# Patient Record
Sex: Male | Born: 1945 | Race: White | Hispanic: No | Smoking: Never smoker
Health system: Southern US, Community
[De-identification: ages and names within clinical notes are randomized; demographics above are authoritative.]

## PROBLEM LIST (undated history)

## (undated) DIAGNOSIS — I69331 Monoplegia of upper limb following cerebral infarction affecting right dominant side: Secondary | ICD-10-CM

## (undated) DIAGNOSIS — R413 Other amnesia: Secondary | ICD-10-CM

## (undated) DIAGNOSIS — I252 Old myocardial infarction: Secondary | ICD-10-CM

## (undated) DIAGNOSIS — K922 Gastrointestinal hemorrhage, unspecified: Secondary | ICD-10-CM

## (undated) DIAGNOSIS — E785 Hyperlipidemia, unspecified: Secondary | ICD-10-CM

## (undated) DIAGNOSIS — G459 Transient cerebral ischemic attack, unspecified: Secondary | ICD-10-CM

## (undated) DIAGNOSIS — E119 Type 2 diabetes mellitus without complications: Secondary | ICD-10-CM

## (undated) DIAGNOSIS — I10 Essential (primary) hypertension: Secondary | ICD-10-CM

## (undated) DIAGNOSIS — E1159 Type 2 diabetes mellitus with other circulatory complications: Secondary | ICD-10-CM

## (undated) DIAGNOSIS — I6529 Occlusion and stenosis of unspecified carotid artery: Secondary | ICD-10-CM

## (undated) DIAGNOSIS — I2510 Atherosclerotic heart disease of native coronary artery without angina pectoris: Secondary | ICD-10-CM

## (undated) DIAGNOSIS — E782 Mixed hyperlipidemia: Secondary | ICD-10-CM

## (undated) HISTORY — DX: Occlusion and stenosis of unspecified carotid artery: I65.29

## (undated) HISTORY — DX: Monoplegia of upper limb following cerebral infarction affecting right dominant side: I69.331

## (undated) HISTORY — PX: SKIN CANCER EXCISION: SHX779

## (undated) HISTORY — PX: PORTA CATH INSERTION: CATH118285

## (undated) HISTORY — DX: Mixed hyperlipidemia: E78.2

## (undated) HISTORY — PX: MOLE REMOVAL: SHX2046

## (undated) HISTORY — DX: Atherosclerotic heart disease of native coronary artery without angina pectoris: I25.10

## (undated) HISTORY — DX: Old myocardial infarction: I25.2

## (undated) HISTORY — PX: HEMORRHOID SURGERY: SHX153

## (undated) HISTORY — DX: Other amnesia: R41.3

## (undated) HISTORY — DX: Type 2 diabetes mellitus with other circulatory complications: E11.59

## (undated) MED FILL — Pembrolizumab IV Soln 100 MG/4ML (25 MG/ML): INTRAVENOUS | Qty: 8 | Status: AC

---

## 2015-11-05 DIAGNOSIS — S022XXB Fracture of nasal bones, initial encounter for open fracture: Secondary | ICD-10-CM | POA: Diagnosis not present

## 2015-11-05 DIAGNOSIS — X58XXXA Exposure to other specified factors, initial encounter: Secondary | ICD-10-CM | POA: Diagnosis not present

## 2015-11-05 DIAGNOSIS — S0121XA Laceration without foreign body of nose, initial encounter: Secondary | ICD-10-CM | POA: Diagnosis not present

## 2015-11-05 DIAGNOSIS — R55 Syncope and collapse: Secondary | ICD-10-CM | POA: Diagnosis not present

## 2015-11-05 DIAGNOSIS — S022XXA Fracture of nasal bones, initial encounter for closed fracture: Secondary | ICD-10-CM | POA: Diagnosis not present

## 2015-11-05 DIAGNOSIS — T1490 Injury, unspecified: Secondary | ICD-10-CM | POA: Diagnosis not present

## 2015-11-05 DIAGNOSIS — R404 Transient alteration of awareness: Secondary | ICD-10-CM | POA: Diagnosis not present

## 2016-08-08 DIAGNOSIS — L821 Other seborrheic keratosis: Secondary | ICD-10-CM | POA: Diagnosis not present

## 2016-08-08 DIAGNOSIS — L570 Actinic keratosis: Secondary | ICD-10-CM | POA: Diagnosis not present

## 2016-08-08 DIAGNOSIS — L814 Other melanin hyperpigmentation: Secondary | ICD-10-CM | POA: Diagnosis not present

## 2016-10-01 DIAGNOSIS — Z23 Encounter for immunization: Secondary | ICD-10-CM | POA: Diagnosis not present

## 2016-10-01 DIAGNOSIS — R42 Dizziness and giddiness: Secondary | ICD-10-CM | POA: Diagnosis not present

## 2016-10-01 DIAGNOSIS — R799 Abnormal finding of blood chemistry, unspecified: Secondary | ICD-10-CM | POA: Diagnosis not present

## 2016-10-15 DIAGNOSIS — Z6831 Body mass index (BMI) 31.0-31.9, adult: Secondary | ICD-10-CM | POA: Diagnosis not present

## 2016-10-15 DIAGNOSIS — Z0000 Encounter for general adult medical examination without abnormal findings: Secondary | ICD-10-CM | POA: Diagnosis not present

## 2016-11-18 DIAGNOSIS — J00 Acute nasopharyngitis [common cold]: Secondary | ICD-10-CM | POA: Diagnosis not present

## 2017-06-16 DIAGNOSIS — J208 Acute bronchitis due to other specified organisms: Secondary | ICD-10-CM | POA: Diagnosis not present

## 2017-08-07 DIAGNOSIS — C44311 Basal cell carcinoma of skin of nose: Secondary | ICD-10-CM | POA: Diagnosis not present

## 2017-08-07 DIAGNOSIS — C44319 Basal cell carcinoma of skin of other parts of face: Secondary | ICD-10-CM | POA: Diagnosis not present

## 2017-08-07 DIAGNOSIS — D2239 Melanocytic nevi of other parts of face: Secondary | ICD-10-CM | POA: Diagnosis not present

## 2017-08-07 DIAGNOSIS — L570 Actinic keratosis: Secondary | ICD-10-CM | POA: Diagnosis not present

## 2017-08-28 DIAGNOSIS — L570 Actinic keratosis: Secondary | ICD-10-CM | POA: Diagnosis not present

## 2017-08-28 DIAGNOSIS — C44629 Squamous cell carcinoma of skin of left upper limb, including shoulder: Secondary | ICD-10-CM | POA: Diagnosis not present

## 2017-08-28 DIAGNOSIS — D2239 Melanocytic nevi of other parts of face: Secondary | ICD-10-CM | POA: Diagnosis not present

## 2017-09-10 DIAGNOSIS — C44329 Squamous cell carcinoma of skin of other parts of face: Secondary | ICD-10-CM | POA: Diagnosis not present

## 2017-09-16 DIAGNOSIS — C44629 Squamous cell carcinoma of skin of left upper limb, including shoulder: Secondary | ICD-10-CM | POA: Diagnosis not present

## 2017-10-20 DIAGNOSIS — Z6829 Body mass index (BMI) 29.0-29.9, adult: Secondary | ICD-10-CM | POA: Diagnosis not present

## 2017-10-20 DIAGNOSIS — Z23 Encounter for immunization: Secondary | ICD-10-CM | POA: Diagnosis not present

## 2017-10-20 DIAGNOSIS — Z0000 Encounter for general adult medical examination without abnormal findings: Secondary | ICD-10-CM | POA: Diagnosis not present

## 2017-10-21 DIAGNOSIS — Z125 Encounter for screening for malignant neoplasm of prostate: Secondary | ICD-10-CM | POA: Diagnosis not present

## 2017-10-21 DIAGNOSIS — Z0000 Encounter for general adult medical examination without abnormal findings: Secondary | ICD-10-CM | POA: Diagnosis not present

## 2017-10-29 DIAGNOSIS — C44629 Squamous cell carcinoma of skin of left upper limb, including shoulder: Secondary | ICD-10-CM | POA: Diagnosis not present

## 2017-10-29 DIAGNOSIS — L570 Actinic keratosis: Secondary | ICD-10-CM | POA: Diagnosis not present

## 2017-10-29 DIAGNOSIS — D485 Neoplasm of uncertain behavior of skin: Secondary | ICD-10-CM | POA: Diagnosis not present

## 2018-01-20 DIAGNOSIS — E119 Type 2 diabetes mellitus without complications: Secondary | ICD-10-CM | POA: Diagnosis not present

## 2018-01-20 DIAGNOSIS — E782 Mixed hyperlipidemia: Secondary | ICD-10-CM | POA: Diagnosis not present

## 2018-09-30 DIAGNOSIS — L820 Inflamed seborrheic keratosis: Secondary | ICD-10-CM | POA: Diagnosis not present

## 2018-10-21 DIAGNOSIS — Z125 Encounter for screening for malignant neoplasm of prostate: Secondary | ICD-10-CM | POA: Diagnosis not present

## 2018-10-21 DIAGNOSIS — Z23 Encounter for immunization: Secondary | ICD-10-CM | POA: Diagnosis not present

## 2018-10-21 DIAGNOSIS — Z6828 Body mass index (BMI) 28.0-28.9, adult: Secondary | ICD-10-CM | POA: Diagnosis not present

## 2018-10-21 DIAGNOSIS — Z0000 Encounter for general adult medical examination without abnormal findings: Secondary | ICD-10-CM | POA: Diagnosis not present

## 2018-10-21 DIAGNOSIS — E1169 Type 2 diabetes mellitus with other specified complication: Secondary | ICD-10-CM | POA: Diagnosis not present

## 2018-10-21 DIAGNOSIS — E782 Mixed hyperlipidemia: Secondary | ICD-10-CM | POA: Diagnosis not present

## 2018-11-02 DIAGNOSIS — R799 Abnormal finding of blood chemistry, unspecified: Secondary | ICD-10-CM | POA: Diagnosis not present

## 2018-12-29 DIAGNOSIS — L570 Actinic keratosis: Secondary | ICD-10-CM | POA: Diagnosis not present

## 2018-12-29 DIAGNOSIS — D485 Neoplasm of uncertain behavior of skin: Secondary | ICD-10-CM | POA: Diagnosis not present

## 2018-12-29 DIAGNOSIS — C44612 Basal cell carcinoma of skin of right upper limb, including shoulder: Secondary | ICD-10-CM | POA: Diagnosis not present

## 2018-12-29 DIAGNOSIS — L821 Other seborrheic keratosis: Secondary | ICD-10-CM | POA: Diagnosis not present

## 2018-12-29 DIAGNOSIS — C44629 Squamous cell carcinoma of skin of left upper limb, including shoulder: Secondary | ICD-10-CM | POA: Diagnosis not present

## 2018-12-29 DIAGNOSIS — D225 Melanocytic nevi of trunk: Secondary | ICD-10-CM | POA: Diagnosis not present

## 2018-12-29 DIAGNOSIS — D2239 Melanocytic nevi of other parts of face: Secondary | ICD-10-CM | POA: Diagnosis not present

## 2019-01-11 DIAGNOSIS — C44629 Squamous cell carcinoma of skin of left upper limb, including shoulder: Secondary | ICD-10-CM | POA: Diagnosis not present

## 2019-01-21 DIAGNOSIS — Z5321 Procedure and treatment not carried out due to patient leaving prior to being seen by health care provider: Secondary | ICD-10-CM | POA: Diagnosis not present

## 2019-01-21 DIAGNOSIS — R05 Cough: Secondary | ICD-10-CM | POA: Diagnosis not present

## 2019-01-23 DIAGNOSIS — E119 Type 2 diabetes mellitus without complications: Secondary | ICD-10-CM | POA: Diagnosis not present

## 2019-01-23 DIAGNOSIS — E860 Dehydration: Secondary | ICD-10-CM | POA: Diagnosis not present

## 2019-01-23 DIAGNOSIS — R7989 Other specified abnormal findings of blood chemistry: Secondary | ICD-10-CM | POA: Diagnosis not present

## 2019-01-23 DIAGNOSIS — J111 Influenza due to unidentified influenza virus with other respiratory manifestations: Secondary | ICD-10-CM | POA: Diagnosis not present

## 2019-01-23 DIAGNOSIS — R000 Tachycardia, unspecified: Secondary | ICD-10-CM | POA: Diagnosis not present

## 2019-01-23 DIAGNOSIS — A419 Sepsis, unspecified organism: Secondary | ICD-10-CM | POA: Diagnosis not present

## 2019-01-23 DIAGNOSIS — J101 Influenza due to other identified influenza virus with other respiratory manifestations: Secondary | ICD-10-CM | POA: Diagnosis not present

## 2019-01-23 DIAGNOSIS — E872 Acidosis: Secondary | ICD-10-CM | POA: Diagnosis not present

## 2019-01-23 DIAGNOSIS — Z79899 Other long term (current) drug therapy: Secondary | ICD-10-CM | POA: Diagnosis not present

## 2019-01-23 DIAGNOSIS — R05 Cough: Secondary | ICD-10-CM | POA: Diagnosis not present

## 2019-01-23 DIAGNOSIS — I10 Essential (primary) hypertension: Secondary | ICD-10-CM | POA: Diagnosis not present

## 2019-01-23 DIAGNOSIS — R0902 Hypoxemia: Secondary | ICD-10-CM | POA: Diagnosis not present

## 2019-01-24 DIAGNOSIS — R7989 Other specified abnormal findings of blood chemistry: Secondary | ICD-10-CM | POA: Diagnosis not present

## 2019-01-24 DIAGNOSIS — E860 Dehydration: Secondary | ICD-10-CM | POA: Diagnosis not present

## 2019-01-24 DIAGNOSIS — I10 Essential (primary) hypertension: Secondary | ICD-10-CM | POA: Diagnosis not present

## 2019-01-24 DIAGNOSIS — J111 Influenza due to unidentified influenza virus with other respiratory manifestations: Secondary | ICD-10-CM | POA: Diagnosis not present

## 2019-01-24 DIAGNOSIS — E119 Type 2 diabetes mellitus without complications: Secondary | ICD-10-CM | POA: Diagnosis not present

## 2019-01-24 DIAGNOSIS — A419 Sepsis, unspecified organism: Secondary | ICD-10-CM | POA: Diagnosis not present

## 2019-01-25 DIAGNOSIS — E860 Dehydration: Secondary | ICD-10-CM | POA: Diagnosis not present

## 2019-01-25 DIAGNOSIS — R7989 Other specified abnormal findings of blood chemistry: Secondary | ICD-10-CM | POA: Diagnosis not present

## 2019-01-25 DIAGNOSIS — E119 Type 2 diabetes mellitus without complications: Secondary | ICD-10-CM | POA: Diagnosis not present

## 2019-01-25 DIAGNOSIS — A419 Sepsis, unspecified organism: Secondary | ICD-10-CM | POA: Diagnosis not present

## 2019-01-25 DIAGNOSIS — J111 Influenza due to unidentified influenza virus with other respiratory manifestations: Secondary | ICD-10-CM | POA: Diagnosis not present

## 2019-01-25 DIAGNOSIS — I10 Essential (primary) hypertension: Secondary | ICD-10-CM | POA: Diagnosis not present

## 2019-01-30 ENCOUNTER — Inpatient Hospital Stay (HOSPITAL_COMMUNITY)
Admission: AD | Admit: 2019-01-30 | Discharge: 2019-02-09 | DRG: 040 | Disposition: A | Discharge status: Home Health | Payer: Medicare HMO | Source: Other Acute Inpatient Hospital | Attending: Neurology | Admitting: Neurology

## 2019-01-30 DIAGNOSIS — E1165 Type 2 diabetes mellitus with hyperglycemia: Secondary | ICD-10-CM | POA: Diagnosis present

## 2019-01-30 DIAGNOSIS — Z6826 Body mass index (BMI) 26.0-26.9, adult: Secondary | ICD-10-CM

## 2019-01-30 DIAGNOSIS — E669 Obesity, unspecified: Secondary | ICD-10-CM | POA: Diagnosis present

## 2019-01-30 DIAGNOSIS — L0291 Cutaneous abscess, unspecified: Secondary | ICD-10-CM

## 2019-01-30 DIAGNOSIS — E785 Hyperlipidemia, unspecified: Secondary | ICD-10-CM | POA: Diagnosis present

## 2019-01-30 DIAGNOSIS — I6340 Cerebral infarction due to embolism of unspecified cerebral artery: Secondary | ICD-10-CM | POA: Diagnosis not present

## 2019-01-30 DIAGNOSIS — J189 Pneumonia, unspecified organism: Secondary | ICD-10-CM | POA: Diagnosis not present

## 2019-01-30 DIAGNOSIS — I69314 Frontal lobe and executive function deficit following cerebral infarction: Secondary | ICD-10-CM | POA: Diagnosis not present

## 2019-01-30 DIAGNOSIS — R471 Dysarthria and anarthria: Secondary | ICD-10-CM | POA: Diagnosis present

## 2019-01-30 DIAGNOSIS — I6502 Occlusion and stenosis of left vertebral artery: Secondary | ICD-10-CM | POA: Diagnosis not present

## 2019-01-30 DIAGNOSIS — E876 Hypokalemia: Secondary | ICD-10-CM | POA: Diagnosis not present

## 2019-01-30 DIAGNOSIS — R531 Weakness: Secondary | ICD-10-CM | POA: Diagnosis present

## 2019-01-30 DIAGNOSIS — Z7984 Long term (current) use of oral hypoglycemic drugs: Secondary | ICD-10-CM | POA: Diagnosis not present

## 2019-01-30 DIAGNOSIS — D649 Anemia, unspecified: Secondary | ICD-10-CM | POA: Diagnosis not present

## 2019-01-30 DIAGNOSIS — J101 Influenza due to other identified influenza virus with other respiratory manifestations: Secondary | ICD-10-CM | POA: Diagnosis not present

## 2019-01-30 DIAGNOSIS — G8321 Monoplegia of upper limb affecting right dominant side: Secondary | ICD-10-CM | POA: Diagnosis not present

## 2019-01-30 DIAGNOSIS — E872 Acidosis: Secondary | ICD-10-CM | POA: Diagnosis not present

## 2019-01-30 DIAGNOSIS — A4102 Sepsis due to Methicillin resistant Staphylococcus aureus: Secondary | ICD-10-CM | POA: Diagnosis not present

## 2019-01-30 DIAGNOSIS — I6389 Other cerebral infarction: Secondary | ICD-10-CM | POA: Diagnosis not present

## 2019-01-30 DIAGNOSIS — I82611 Acute embolism and thrombosis of superficial veins of right upper extremity: Secondary | ICD-10-CM | POA: Diagnosis not present

## 2019-01-30 DIAGNOSIS — R200 Anesthesia of skin: Secondary | ICD-10-CM | POA: Diagnosis not present

## 2019-01-30 DIAGNOSIS — D72829 Elevated white blood cell count, unspecified: Secondary | ICD-10-CM | POA: Diagnosis not present

## 2019-01-30 DIAGNOSIS — R29701 NIHSS score 1: Secondary | ICD-10-CM | POA: Diagnosis present

## 2019-01-30 DIAGNOSIS — R6520 Severe sepsis without septic shock: Secondary | ICD-10-CM | POA: Diagnosis not present

## 2019-01-30 DIAGNOSIS — R112 Nausea with vomiting, unspecified: Secondary | ICD-10-CM

## 2019-01-30 DIAGNOSIS — R7881 Bacteremia: Secondary | ICD-10-CM | POA: Diagnosis not present

## 2019-01-30 DIAGNOSIS — Z8673 Personal history of transient ischemic attack (TIA), and cerebral infarction without residual deficits: Secondary | ICD-10-CM

## 2019-01-30 DIAGNOSIS — I10 Essential (primary) hypertension: Secondary | ICD-10-CM | POA: Diagnosis present

## 2019-01-30 DIAGNOSIS — J111 Influenza due to unidentified influenza virus with other respiratory manifestations: Secondary | ICD-10-CM | POA: Diagnosis present

## 2019-01-30 DIAGNOSIS — Z9282 Status post administration of tPA (rtPA) in a different facility within the last 24 hours prior to admission to current facility: Secondary | ICD-10-CM | POA: Diagnosis not present

## 2019-01-30 DIAGNOSIS — E119 Type 2 diabetes mellitus without complications: Secondary | ICD-10-CM

## 2019-01-30 DIAGNOSIS — I639 Cerebral infarction, unspecified: Secondary | ICD-10-CM | POA: Diagnosis present

## 2019-01-30 DIAGNOSIS — I6522 Occlusion and stenosis of left carotid artery: Secondary | ICD-10-CM | POA: Diagnosis not present

## 2019-01-30 DIAGNOSIS — A4101 Sepsis due to Methicillin susceptible Staphylococcus aureus: Secondary | ICD-10-CM | POA: Diagnosis not present

## 2019-01-30 DIAGNOSIS — I82629 Acute embolism and thrombosis of deep veins of unspecified upper extremity: Secondary | ICD-10-CM | POA: Diagnosis not present

## 2019-01-30 DIAGNOSIS — Z8709 Personal history of other diseases of the respiratory system: Secondary | ICD-10-CM | POA: Diagnosis not present

## 2019-01-30 DIAGNOSIS — B9562 Methicillin resistant Staphylococcus aureus infection as the cause of diseases classified elsewhere: Secondary | ICD-10-CM | POA: Diagnosis not present

## 2019-01-30 DIAGNOSIS — B9561 Methicillin susceptible Staphylococcus aureus infection as the cause of diseases classified elsewhere: Secondary | ICD-10-CM | POA: Diagnosis not present

## 2019-01-30 DIAGNOSIS — L03113 Cellulitis of right upper limb: Secondary | ICD-10-CM | POA: Diagnosis not present

## 2019-01-30 DIAGNOSIS — I82621 Acute embolism and thrombosis of deep veins of right upper extremity: Secondary | ICD-10-CM | POA: Diagnosis not present

## 2019-01-30 DIAGNOSIS — A419 Sepsis, unspecified organism: Secondary | ICD-10-CM | POA: Diagnosis not present

## 2019-01-30 DIAGNOSIS — I672 Cerebral atherosclerosis: Secondary | ICD-10-CM | POA: Diagnosis not present

## 2019-01-30 DIAGNOSIS — L0390 Cellulitis, unspecified: Secondary | ICD-10-CM | POA: Diagnosis not present

## 2019-01-30 DIAGNOSIS — E1159 Type 2 diabetes mellitus with other circulatory complications: Secondary | ICD-10-CM | POA: Diagnosis not present

## 2019-01-30 HISTORY — DX: Essential (primary) hypertension: I10

## 2019-01-30 HISTORY — DX: Hyperlipidemia, unspecified: E78.5

## 2019-01-30 HISTORY — DX: Transient cerebral ischemic attack, unspecified: G45.9

## 2019-01-30 HISTORY — DX: Gastrointestinal hemorrhage, unspecified: K92.2

## 2019-01-30 HISTORY — DX: Type 2 diabetes mellitus without complications: E11.9

## 2019-01-30 LAB — MRSA PCR SCREENING: MRSA by PCR: NEGATIVE

## 2019-01-30 LAB — GLUCOSE, CAPILLARY: Glucose-Capillary: 160 mg/dL — ABNORMAL HIGH (ref 70–99)

## 2019-01-30 MED ORDER — ACETAMINOPHEN 160 MG/5ML PO SOLN: 650.0000 mg | ORAL | Status: DC | PRN

## 2019-01-30 MED ORDER — GLUCERNA SHAKE PO LIQD
237.0000 mL | Freq: Three times a day (TID) | ORAL | Status: DC
  Administered 2019-01-30 – 2019-02-09 (×17): 237 mL via ORAL
  Filled 2019-01-30 (×3): qty 237

## 2019-01-30 MED ORDER — OSELTAMIVIR PHOSPHATE 75 MG PO CAPS
75.0000 mg | ORAL_CAPSULE | Freq: Every day | ORAL | Status: DC
  Filled 2019-01-30: qty 1

## 2019-01-30 MED ORDER — SENNOSIDES-DOCUSATE SODIUM 8.6-50 MG PO TABS: 1.0000 | ORAL_TABLET | Freq: Every evening | ORAL | Status: DC | PRN

## 2019-01-30 MED ORDER — CLEVIDIPINE BUTYRATE 0.5 MG/ML IV EMUL: 0.0000 mg/h | INTRAVENOUS | Status: DC

## 2019-01-30 MED ORDER — GUAIFENESIN-CODEINE 100-10 MG/5ML PO SOLN
5.0000 mL | ORAL | Status: DC | PRN
  Administered 2019-01-30 – 2019-02-08 (×7): 5 mL via ORAL
  Filled 2019-01-30 (×9): qty 5

## 2019-01-30 MED ORDER — ACETAMINOPHEN 325 MG PO TABS
650.0000 mg | ORAL_TABLET | ORAL | Status: DC | PRN
  Administered 2019-02-04: 650 mg via ORAL
  Filled 2019-01-30: qty 2

## 2019-01-30 MED ORDER — OSELTAMIVIR PHOSPHATE 75 MG PO CAPS
75.0000 mg | ORAL_CAPSULE | Freq: Every day | ORAL | Status: DC
  Administered 2019-01-31: 75 mg via ORAL
  Filled 2019-01-30: qty 1

## 2019-01-30 MED ORDER — ACETAMINOPHEN 650 MG RE SUPP
650.0000 mg | RECTAL | Status: DC | PRN
  Administered 2019-02-04: 650 mg via RECTAL
  Filled 2019-01-30 (×2): qty 1

## 2019-01-30 MED ORDER — SODIUM CHLORIDE 0.9 % IV SOLN
INTRAVENOUS | Status: DC
  Administered 2019-01-30 – 2019-02-01 (×2): via INTRAVENOUS
  Administered 2019-02-03: 500 mL via INTRAVENOUS
  Administered 2019-02-03: 22:00:00 via INTRAVENOUS

## 2019-01-30 MED ORDER — INSULIN ASPART 100 UNIT/ML ~~LOC~~ SOLN
0.0000 [IU] | Freq: Three times a day (TID) | SUBCUTANEOUS | Status: DC
  Administered 2019-01-31: 2 [IU] via SUBCUTANEOUS
  Administered 2019-02-01: 1 [IU] via SUBCUTANEOUS
  Administered 2019-02-01: 2 [IU] via SUBCUTANEOUS
  Administered 2019-02-02 (×2): 1 [IU] via SUBCUTANEOUS
  Administered 2019-02-04: 2 [IU] via SUBCUTANEOUS
  Administered 2019-02-04 – 2019-02-05 (×2): 1 [IU] via SUBCUTANEOUS
  Administered 2019-02-05: 2 [IU] via SUBCUTANEOUS
  Administered 2019-02-06 – 2019-02-07 (×3): 1 [IU] via SUBCUTANEOUS
  Administered 2019-02-07: 2 [IU] via SUBCUTANEOUS
  Administered 2019-02-08: 1 [IU] via SUBCUTANEOUS

## 2019-01-30 MED ORDER — STROKE: EARLY STAGES OF RECOVERY BOOK
Freq: Once | Status: AC
  Administered 2019-02-02: 18:00:00
  Filled 2019-01-30: qty 1

## 2019-01-30 MED ORDER — LABETALOL HCL 5 MG/ML IV SOLN: 20.0000 mg | Freq: Once | INTRAVENOUS | Status: DC

## 2019-01-30 MED ORDER — PANTOPRAZOLE SODIUM 40 MG IV SOLR
40.0000 mg | Freq: Every day | INTRAVENOUS | Status: DC
  Administered 2019-01-30: 40 mg via INTRAVENOUS
  Filled 2019-01-30: qty 40

## 2019-01-30 NOTE — H&P (Addendum)
NEURO HOSPITALIST  H&P   Chief Complaint:  Right arm weakness and numbness  History obtained from:  Patient  Chart   HPI:                                                                                                                                         Jon Nelson is an 73 y.o. male  With PMH diabetes, HTN, depression who presented to [facility] ED with c/o of sudden right arm weakness. Patient transferred to [facility] s/p TPA.   Per patient he was getting in the car about 11:30am today when he noticed that his right hand was weak. He was unable to put the keys in the ignition and start the car. So he started the car with hiss left hand drove to his sisters house. Denies any CP, SOB, leg weakness, vision changes or HA. No prior stroke history. Has had TIA in the past as well as GI bleed ( rectal bleeding). Patient has been taking tamiflu once daily for the past 3 days for the flu.  Hospital course:  CTA: no LVO CTH: no hemorrhage. BG: 98 BP: 140/71 unremarkable  CMP and CBC.  Date last known well: Date: 01/30/2019 Time last known well: Time: 11:30 tPA Given: Yes ; started @ 1357 Modified Rankin: Rankin Score=0 NIHSS:1  Past medical history Diabetes Hypertension Hyperlipidemia  No family history on file.   Significant family history  Social History:  has no history on file for tobacco, alcohol, and drug.  Allergies: Allergies not on file  Medications:                                                                                                                           Scheduled: .  stroke: mapping our early stages of recovery book   Does not apply Once  . labetalol  20 mg Intravenous Once  . oseltamivir  75 mg Oral Daily  . pantoprazole (PROTONIX) IV  40 mg Intravenous QHS   Continuous: . sodium chloride    . clevidipine     SVX:[id number] **OR** acetaminophen (TYLENOL) oral  liquid 160 mg/5 mL **OR** acetaminophen,  senna-docusate  ROS:                                                                                                             ROS was performed and is negative except as noted in HPI  General Examination:                                                                                                      Blood pressure (!) 145/80, pulse 73, temperature 97.8 F (36.6 C), temperature source Oral, resp. rate 16, height 5\' 9"  (1.753 m), weight 79.9 kg, SpO2 99 %.  HEENT-  Normocephalic, no lesions, without obvious abnormality.  Normal external eye and conjunctiva. Cardiovascular- S1-S2 audible, pulses palpable throughout  Lungs-no rhonchi or wheezing noted, no excessive working breathing.  Saturations within normal limits Abdomen- All 4 quadrants palpated and non tender Extremities- Warm, dry and intact Musculoskeletal-no joint tenderness, deformity or swelling Skin-warm and dry, no hyperpigmentation, vitiligo, or suspicious lesions  Neurological Examination Mental Status: Alert, oriented, thought content appropriate.  Speech fluent without evidence of aphasia.  Able to follow commands without difficulty.  Mild dysarthria. Cranial Nerves: II: ; Visual fields grossly normal,  III,IV, VI: ptosis not present, extra-ocular motions intact bilaterally, pupils equal, round, reactive to light and accommodation V,VII: smile symmetric, facial light touch sensation normal bilaterally VIII: hearing normal bilaterally IX,X: uvula rises midline XI: bilateral shoulder shrug XII: midline tongue extension Motor: Right upper extremity shows no vertical drift but is 4/5 at the biceps, triceps and brachioradialis.  Small muscles of the right hand are 2/5.  Right leg 5/5.  Left upper and lower extremities 5/5. Tone and bulk:normal tone throughout; no atrophy noted Sensory:  light touch intact throughout, bilaterally Plantars: Right: downgoing   Left: downgoing Cerebellar: normal finger-to-nose, normal  heel-to-shin test Gait: deferred   Lab Results: unremarkable CMP and CBC.  Imaging: Reviewed imaging report from [place], MSN, NP-C [facility] [phone]   01/30/2019, 3:00 PM   Attending physician note to follow with Assessment and plan .  Attending addendum I have seen and examined the patient in the ICU. I was called by [facility] ED provider to transfer the patient to the ICU under neurology service after he was given IV TPA at the outside hospital. Briefly, from history with the patient and review of chart, last known normal 11:30 AM and had sudden onset of right hand and arm weakness.  He still drove his car to his sister's house and was then taken to the hospital where he was seen  by telemedicine neurology.  Initial NIH stroke scale was low but he was given IV TPA because of disabling symptoms. Of note, patient on treatment with Tamiflu because of recent diagnosis of influenza.  Has taken approximately 2 days worth of treatment.  Continues to have some nasal drainage, cough and congested eyes. I have independently reviewed imaging in the PACS system-CT head no acute changes, aspects 10.  CTA of the head and neck shows no emergent large vessel occlusion. I agree with examination documented above-NIH stroke scale 1.  Assessment: 73 y.o. male  With PMH diabetes, HTN, depression who presented to [facility] ED with c/o of sudden right arm weakness.  Evaluated by telemedicine neurology, given IV TPA. Patient transferred to [facility] s/p TPA. CTA: no LVO CTH: no hemorrhage. Stroke Risk Factors - diabetes mellitus and hypertension   Plan: Acute Ischemic Stroke Acuity: Acute Current Suspected Etiology: Under investigation- suspect small vessel disease Continue Evaluation:  -Admit to: Neurological ICU -Hold Aspirin until 24 hour post tPA neuroimaging is stable and without evidence of bleeding -Blood pressure control, goal of SYS <180.  Use  labetalol/hydralazine/Cleviprex drip as needed. -MRI/ECHO/A1C/Lipid panel. -Hyperglycemia management per SSI to maintain glucose 140-180mg /dL. -PT/OT/ST therapies and recommendations when able  CNS -Close neuro monitoring  Dysarthria Dysphagia following cerebral infarction  -NPO until cleared by speech -ST -Advance diet as tolerated  Hemiplegia and hemiparesis following cerebral infarction affecting right dominant side -PT/OT -PM&R consult  RESP No acute issues. -Monitor clinically -Maintain saturations above 92%.    CV Essential (primary) hypertension -Aggressive BP control as documented above. -TTE  Hyperlipidemia, unspecified  - Statin for goal LDL < 70  HEME Labs reviewed from [place].  Her globin 14.4, platelets over 300. -Monitor -transfuse for hgb < 7  ENDO Type 2 diabetes mellitus with hyperglycemia  -SSI -goal HgbA1c < 7  GI/GU Labs reviewed-creatinine 0.6 Normal sodium and potassium -Gentle hydration -Monitor labs -Replete as necessary  ID Influenza positive -Tamiflu for 3 more days -Droplet precautions -CXR  Nutrition E66.9 Obesity  -diet consult  Prophylaxis DVT: SCDs.  No antiplatelets or anticoagulants until 24-hour post TPA scan negative for bleed. GI: Protonix Bowel: Docusate/senna  Diet: NPO until cleared by speech or bedside swallow evaluation.  Code Status: Full CodeDNR   THE FOLLOWING WERE PRESENT ON ADMISSION: Acute ischemic stroke Right hemiparesis Influenza Dysarthria   -- Amie Portland, MD [facility] Pager: [phone] If 7pm to 7am, please call on call as listed on AMION.    CRITICAL CARE ATTESTATION Performed by: Amie Portland, MD Total critical care time: 30 minutes Critical care time was exclusive of separately billable procedures and treating other patients and/or supervising APPs/Residents/Students Critical care was necessary to treat or prevent imminent or life-threatening deterioration  due to acute ischemic stroke, influenza, diabetes This patient is critically ill and at significant risk for neurological worsening and/or death and care requires constant monitoring. Critical care was time spent personally by me on the following activities: development of treatment plan with patient and/or surrogate as well as nursing, discussions with consultants, evaluation of patient's response to treatment, examination of patient, obtaining history from patient or surrogate, ordering and performing treatments and interventions, ordering and review of laboratory studies, ordering and review of radiographic studies, pulse oximetry, re-evaluation of patient's condition, participation in multidisciplinary rounds and medical decision making of high complexity in the care of this patient.

## 2019-01-30 NOTE — Progress Notes (Signed)
Pt complains of frequent cough. MD made aware and new orders in [facility].

## 2019-01-31 ENCOUNTER — Inpatient Hospital Stay (HOSPITAL_COMMUNITY): Payer: Medicare HMO

## 2019-01-31 ENCOUNTER — Encounter (HOSPITAL_COMMUNITY): Payer: Self-pay | Admitting: Neurology

## 2019-01-31 ENCOUNTER — Other Ambulatory Visit (HOSPITAL_COMMUNITY): Payer: Medicare HMO

## 2019-01-31 DIAGNOSIS — I6522 Occlusion and stenosis of left carotid artery: Secondary | ICD-10-CM

## 2019-01-31 DIAGNOSIS — I10 Essential (primary) hypertension: Secondary | ICD-10-CM | POA: Diagnosis present

## 2019-01-31 DIAGNOSIS — E119 Type 2 diabetes mellitus without complications: Secondary | ICD-10-CM

## 2019-01-31 DIAGNOSIS — I639 Cerebral infarction, unspecified: Secondary | ICD-10-CM

## 2019-01-31 LAB — LIPID PANEL
CHOLESTEROL: 120 mg/dL (ref 0–200)
HDL: 22 mg/dL — ABNORMAL LOW (ref >40)
LDL Cholesterol: 71 mg/dL (ref 0–99)
Total CHOL/HDL Ratio: 5.5 RATIO
Triglycerides: 137 mg/dL (ref <150)
VLDL: 27 mg/dL (ref 0–40)

## 2019-01-31 LAB — GLUCOSE, CAPILLARY
GLUCOSE-CAPILLARY: 175 mg/dL — AB (ref 70–99)
GLUCOSE-CAPILLARY: 136 mg/dL — AB (ref 70–99)
Glucose-Capillary: 120 mg/dL — ABNORMAL HIGH (ref 70–99)
Glucose-Capillary: 120 mg/dL — ABNORMAL HIGH (ref 70–99)

## 2019-01-31 LAB — HEMOGLOBIN A1C
Hgb A1c MFr Bld: 6.6 % — ABNORMAL HIGH (ref 4.8–5.6)
Mean Plasma Glucose: 142.72 mg/dL

## 2019-01-31 LAB — ECHOCARDIOGRAM COMPLETE
HEIGHTINCHES: 69 in
Weight: 2864 oz

## 2019-01-31 MED ORDER — LORAZEPAM 2 MG/ML IJ SOLN
0.5000 mg | Freq: Once | INTRAMUSCULAR | Status: AC | PRN
  Administered 2019-01-31: 0.5 mg via INTRAVENOUS
  Filled 2019-01-31: qty 1

## 2019-01-31 MED ORDER — OSELTAMIVIR PHOSPHATE 75 MG PO CAPS
75.0000 mg | ORAL_CAPSULE | Freq: Two times a day (BID) | ORAL | Status: AC
  Administered 2019-01-31 – 2019-02-02 (×5): 75 mg via ORAL
  Filled 2019-01-31 (×5): qty 1

## 2019-01-31 MED ORDER — ASPIRIN 325 MG PO TABS
325.0000 mg | ORAL_TABLET | Freq: Every day | ORAL | Status: DC
  Administered 2019-01-31 – 2019-02-05 (×5): 325 mg via ORAL
  Filled 2019-01-31 (×5): qty 1

## 2019-01-31 MED ORDER — NAPHAZOLINE-GLYCERIN 0.012-0.2 % OP SOLN
1.0000 [drp] | Freq: Four times a day (QID) | OPHTHALMIC | Status: DC | PRN
  Filled 2019-01-31: qty 15

## 2019-01-31 MED ORDER — PANTOPRAZOLE SODIUM 40 MG PO TBEC
40.0000 mg | DELAYED_RELEASE_TABLET | Freq: Every day | ORAL | Status: DC
  Administered 2019-01-31 – 2019-02-08 (×9): 40 mg via ORAL
  Filled 2019-01-31 (×8): qty 1

## 2019-01-31 NOTE — Progress Notes (Signed)
  Echocardiogram 2D Echocardiogram has been performed.  Merrie Roof F 01/31/2019, 2:44 PM

## 2019-01-31 NOTE — Progress Notes (Signed)
STROKE TEAM PROGRESS NOTE   HISTORY OF PRESENT ILLNESS (per record) Jon Nelson is an 73 y.o. male  With PMH diabetes, HTN, depression who presented to [facility] ED with c/o of sudden right arm weakness. Patient transferred to [facility] s/p TPA.   Per patient he was getting in the car about 11:30am today when he noticed that his right hand was weak. He was unable to put the keys in the ignition and start the car. So he started the car with his left hand drove to his sisters house. Denies any CP, SOB, leg weakness, vision changes or HA. No prior stroke history. Has had TIA in the past as well as GI bleed ( rectal bleeding). Patient has been taking tamiflu once daily for the past 3 days for the flu.  Hospital course:  CTA: no LVO CTH: no hemorrhage. BG: 98 BP: 140/71 unremarkable  CMP and CBC.  Date last known well: Date: 01/30/2019 Time last known well: Time: 11:30 tPA Given: Yes ; started @ 1357 Modified Rankin: Rankin Score=0 NIHSS:1   SUBJECTIVE (INTERVAL HISTORY) His family is not at bedside He says he feels pretty good. Right arm still weak with sensory loss.     OBJECTIVE Vitals:   01/31/19 0800 01/31/19 0900 01/31/19 1000 01/31/19 1100  BP: 118/88 (!) 113/54 119/75 127/70  Pulse: 73 67 72 76  Resp: 19 16 19 19   Temp: 98.4 F (36.9 C)     TempSrc: Oral     SpO2: 94% 95% 94% 95%  Weight:      Height:        CBC: No results for input(s): WBC, NEUTROABS, HGB, HCT, MCV, PLT in the last 168 hours.  Basic Metabolic Panel: No results for input(s): NA, K, CL, CO2, GLUCOSE, BUN, CREATININE, CALCIUM, MG, PHOS in the last 168 hours.  Lipid Panel:     Component Value Date/Time   CHOL 120 01/31/2019 0513   TRIG 137 01/31/2019 0513   HDL 22 (L) 01/31/2019 0513   CHOLHDL 5.5 01/31/2019 0513   VLDL 27 01/31/2019 0513   LDLCALC 71 01/31/2019 0513   HgbA1c:  Lab Results  Component Value Date   HGBA1C 6.6 (H) 01/31/2019   Urine Drug Screen: No results found for: LABOPIA,  COCAINSCRNUR, LABBENZ, AMPHETMU, THCU, LABBARB  Alcohol Level No results found for: ETH  IMAGING   No results found.  MR BRAIN WO CONTRAST 01/30/2019 Pending   CT HEAD WO CONTRAST  01/30/2019 Was negative for acute event at [facility], moderate small-vessel ischemic changes  Transthoracic Echocardiogram  00/00/2020 Pending    CTA of H/N 01/30/2019 at [facility] showed No LVO but cervical carotid artery atherosclerosis, prominent soft plaque in the left carotid bulb, atherosclerosis with mild right and moderate left cavernous ICA stenosis and mod-severe right P1 stenosis. Moderate distal left vertebral artery stenosis.     Exam: NAD, pleasant                  Speech:    Speech is normal; fluent and spontaneous with normal comprehension.  Cognition:    The patient is oriented to person, place, and time, month, year.    recent and remote memory intact;     language fluent;    Cranial Nerves:    The pupils are equal, round, and reactive to light.VA intact. Trigeminal sensation is intact and the muscles of mastication are normal. The face is symmetric. The palate elevates in the midline. Hearing intact. Voice is normal. Shoulder shrug is  normal. The tongue has normal motion without fasciculations.   Coordination:  right arm impaired FTN appropriate for weakness, decreased fine motor on the right.   Motor Observation:    no involuntary movements noted. Tone:    Normal muscle tone.      Strength: Right arm: biceps/deltoid 3/5, triceps 4+5, right hand grip weak. Otherwise strength is V/V in the upper and lower limbs.      Sensation: decreased right arm sensation    PAST MEDICAL HISTORY Past Medical History:  Diagnosis Date  . Diabetes ([place])   . HLD (hyperlipidemia)   . HTN (hypertension)     SURGICAL HISTORY History reviewed. No pertinent surgical history.  FAMILY HISTORY History reviewed. No pertinent family history.  SOCIAL HISTORY  has no history on file for  tobacco, alcohol, and drug.  ASSESSMENT/PLAN [name] with history of diabetes, HTN, TIA, GI Bleed, HLD, depression presenting with sudden right arm weakness.  tPA Given at OSH 01/30/2019 @ 1357  Suspected stroke:  MRI pending  Resultant  Right arm weakness and sensory loss  CT head at [facility] - moderate small vessel ischemic disease, nothing acute   MRI head - pending  MRA head - not performed (See CTA H&N)  CTA H&N - no LVO Jon Nelson). CTA of H/N at [facility] showed No LVO but cervical carotid artery atherosclerosis, prominent soft plaque in the left carotid bulb, atherosclerosis with mild right and moderate left cavernous ICA stenosis and mod-severe right P1 stenosis. Moderate distal left vertebral artery stenosis.   Prominent soft plaque in the left carotid bulb may need vascular consult pending MRI results.   LDL - 71  HgbA1c - 6.6  UDS - not performed  VTE prophylaxis - SCDs  Diet  - Carb modified with thin liquids  No antithrombotic prior to admission, now on No antithrombotic   (post tPA)  Patient counseled to be compliant with his antithrombotic medications  Ongoing aggressive stroke risk factor management  Therapy recommendations:  pending  Disposition:  Pending  Hypertension  Stable . Permissive hypertension - Keep SBP < 180 mmHg post tPA . Long-term BP goal normotensive  Hyperlipidemia  Lipid lowering medication PTA:  none  LDL 71, goal < 70  Current lipid lowering medication: none - if positive for stroke start statin  Continue statin at discharge  Diabetes  HgbA1c 6.6, goal < 7.0  Controlled  Other Stroke Risk Factors  Advanced age History pending  Cigarette smoker ?  ETOH use ?  No prior hx of stroke, TIA in the past  Family hx stroke ?   Other Active Problems  Recent flu like sxs - on Tamiflu - droplet precautions   Hospital day # 1  Patient with acute onset right arm weakness and sensory  changes.  He presented to an outside hospital and was given TPA.  Transferred here.  Continues to have symptoms.  Pending MRI of the brain. Prominent soft plaque in the left carotid bulb may need vascular consult pending MRI results.   Personally examined patient and images, and have participated in and made any corrections needed to history, physical, neuro exam,assessment and plan as stated above.  I have personally obtained the history, evaluated lab date, reviewed imaging studies and agree with radiology interpretations.    Sarina Ill, MD Stroke Neurology   A total of 25 minutes was spent for the care of this patient, spent on counseling patient and family on different diagnostic and therapeutic  options, counseling and coordination of care, riskd ans benefits of management, compliance, or risk factor reduction and education.   To contact Stroke Continuity provider, please refer to [email or web address] After hours, contact General Neurology

## 2019-01-31 NOTE — Consult Note (Signed)
Hospital Consult    Reason for Consult:  cva Referring Physician:  Dr. Jaynee Eagles MRN #:  [id number]  History of Present Illness: This is a 73 y.o. male risk factors of diabetes and hypertension presented to outside hospital with sudden arm weakness.  Still has weakness in his right hand although arm has improved.  Did not have other symptoms per patient but per family possibly had slurring of speech at the time.  Notes TIA in the past with no previous strokes and no amaurosis.  Recently recovered from influenza virus currently on droplet precautions.  TPA was given yesterday at the time of stroke.  Past Medical History:  Diagnosis Date  . Diabetes ([place])   . GI bleed   . HLD (hyperlipidemia)   . HTN (hypertension)   . TIA (transient ischemic attack)     History reviewed. No pertinent surgical history.  No Known Allergies  Prior to Admission medications   Medication Sig Start Date End Date Taking? Authorizing Provider  atorvastatin (LIPITOR) 10 MG tablet Take 10 mg by mouth daily. 01/14/19  Yes [provider]  METFORMIN HCL PO Take 1 tablet by mouth every morning.   Yes [provider]  oseltamivir (TAMIFLU) 75 MG capsule Take 75 mg by mouth daily. 01/30/19 02/03/19 Yes [provider]    Social History   Socioeconomic History  . Marital status: Married    Spouse name: Not on file  . Number of children: Not on file  . Years of education: Not on file  . Highest education level: Not on file  Occupational History  . Not on file  Social Needs  . Financial resource strain: Not on file  . Food insecurity:    Worry: Not on file    Inability: Not on file  . Transportation needs:    Medical: Not on file    Non-medical: Not on file  Tobacco Use  . Smoking status: Not on file  Substance and Sexual Activity  . Alcohol use: Not on file  . Drug use: Not on file  . Sexual activity: Not on file  Lifestyle  . Physical activity:    Days per week: Not on file      Minutes per session: Not on file  . Stress: Not on file  Relationships  . Social connections:    Talks on phone: Not on file    Gets together: Not on file    Attends religious service: Not on file    Active member of club or organization: Not on file    Attends meetings of clubs or organizations: Not on file    Relationship status: Not on file  . Intimate partner violence:    Fear of current or ex partner: Not on file    Emotionally abused: Not on file    Physically abused: Not on file    Forced sexual activity: Not on file  Other Topics Concern  . Not on file  Social History Narrative  . Not on file     History reviewed. No pertinent family history.  ROS:  Cardiovascular: []  chest pain/pressure []  palpitations []  SOB lying flat []  DOE []  pain in legs while walking []  pain in legs at rest []  pain in legs at night []  non-healing ulcers []  hx of DVT []  swelling in legs  Pulmonary: []  productive cough []  asthma/wheezing []  home O2  Neurologic: [x]  weakness in [x]  arms []  legs []  numbness in []  arms []  legs []  hx of  CVA []  mini stroke [] difficulty speaking or slurred speech []  temporary loss of vision in one eye []  dizziness  Hematologic: [x]  hx of cancer (skin cancer) []  bleeding problems []  problems with blood clotting easily  Endocrine:   []  diabetes []  thyroid disease  GI []  vomiting blood []  blood in stool  GU: []  CKD/renal failure []  HD--[]  M/W/F or []  T/T/S []  burning with urination []  blood in urine  Psychiatric: []  anxiety []  depression  Musculoskeletal: []  arthritis []  joint pain  Integumentary: []  rashes []  ulcers  Constitutional: []  fever []  chills   Physical Examination  Vitals:   01/31/19 1357 01/31/19 1400  BP: 108/73 110/68  Pulse: 85 97  Resp: (!) 23 15  Temp:    SpO2: 94% 95%   Body mass index is 26.01 kg/m.  General:  NAD Gait: Not observed HENT: WNL, normocephalic Pulmonary: normal non-labored  breathing Cardiac: Palpable bilateral radial and femoral pulses Abdomen:  soft, NT/ND, no masses Extremities: Sutures in left arm otherwise no abnormalities Neurologic: A&O X 3; Appropriate Affect ; right hand weakness noted  CBC No results found for: WBC, RBC, HGB, HCT, PLT, MCV, MCH, MCHC, RDW, LYMPHSABS, MONOABS, EOSABS, BASOSABS  BMET No results found for: NA, K, CL, CO2, GLUCOSE, BUN, CREATININE, CALCIUM, GFRNONAA, GFRAA  COAGS: No results found for: INR, PROTIME   Non-Invasive Vascular Imaging:   MRI  IMPRESSION: 1. Small scattered foci of acute/early subacute infarction are present within the left posterosuperior frontal and parietal lobes inclusive of precentral gyrus. No hemorrhage or mass effect. 2. Moderate chronic microvascular ischemic changes and volume loss of the brain. 3. Paranasal sinus disease with fluid levels which may represent acute sinusitis. Partial bilateral mastoid opacification.   CTAngio  IMPRESSION: 1. No large vessel occlusion. 2. Cervical carotid artery atherosclerosis including prominent soft plaque in the left carotid bulb. No significant stenosis. 3. Intracranial atherosclerosis resulting in mild right moderate left cavernous ICA stenoses and a moderate to severe right P1 stenosis. 4. Moderate distal left vertebral artery stenosis. 5. Aortic Atherosclerosis (ICD10-I70.0).    ASSESSMENT/PLAN: This is a 73 y.o. 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  CT Angie of his neck at [facility] demonstrates significant amount of soft plaque at the carotid bifurcation although by NASCET criteria is insignificant stenosis does appear to be probable cause for embolic lesion if no other source is found.  I discussed with patient likely need for carotid endarterectomy versus stenting and I will discuss with team tomorrow.  Would plan intervention prior to 2 weeks.  Needs antiplatelet and statin therapy.  Joie Reamer C.  Donzetta Matters, MD [facility] Office: [phone] Pager: [phone]

## 2019-01-31 NOTE — Progress Notes (Signed)
PT Cancellation Note  Patient Details Name: Jon Nelson MRN: [id number] DOB: 03-06-1946   Cancelled Treatment:    Reason Eval/Treat Not Completed: Active bedrest order   Duncan Dull 01/31/2019, 6:34 AM

## 2019-01-31 NOTE — Progress Notes (Signed)
Aspirin 325mg  restarted 01/31/19 18:05p.

## 2019-02-01 ENCOUNTER — Inpatient Hospital Stay (HOSPITAL_COMMUNITY): Payer: Medicare HMO

## 2019-02-01 DIAGNOSIS — I639 Cerebral infarction, unspecified: Secondary | ICD-10-CM

## 2019-02-01 LAB — GLUCOSE, CAPILLARY
Glucose-Capillary: 123 mg/dL — ABNORMAL HIGH (ref 70–99)
Glucose-Capillary: 99 mg/dL (ref 70–99)
Glucose-Capillary: 188 mg/dL — ABNORMAL HIGH (ref 70–99)

## 2019-02-01 MED ORDER — ATORVASTATIN CALCIUM 40 MG PO TABS
40.0000 mg | ORAL_TABLET | Freq: Every day | ORAL | Status: DC
  Administered 2019-02-01 – 2019-02-03 (×3): 40 mg via ORAL
  Filled 2019-02-01 (×4): qty 1

## 2019-02-01 NOTE — Progress Notes (Signed)
Nutrition Brief Note  Patient identified on the Malnutrition Screening Tool (MST) Report  Wt Readings from Last 15 Encounters:  01/30/19 79.9 kg  01/31/19 81.2 kg    Body mass index is 26.01 kg/m. Patient meets criteria for overweight based on current BMI. Pt reports weight has been stable.   Current diet order is carbohydrate modified. Pt reports having a good appetite currently and PTA with usual consumption of at least 3 meals a day with no difficulties. Pt with no fat mass or muscle mass depletion. Labs and medications reviewed. Pt currently has Glucerna Shake ordered and would like to continue with them.  No nutrition interventions warranted at this time. If nutrition issues arise, please consult RD.   Corrin Parker, MS, RD, LDN Pager # [phone] After hours/ weekend pager # [phone]

## 2019-02-01 NOTE — Progress Notes (Signed)
OT Cancellation Note  Patient Details Name: Jon Nelson MRN: [id number] DOB: 1946/02/23   Cancelled Treatment:    Reason Eval/Treat Not Completed: Active bedrest order. Will follow and initiate OT eval when appropriate and able.   Delight Stare, OT Acute Rehabilitation Services Pager [phone] Office [phone]    Delight Stare 02/01/2019, 7:39 AM

## 2019-02-01 NOTE — Evaluation (Addendum)
Physical Therapy Evaluation Patient Details Name: Jon Nelson MRN: [id number] DOB: [birth year] Today's Date: 02/01/2019   History of Present Illness  Jon Nelson is an 73 y.o. male  With PMH diabetes, HTN, depression who presented to [facility] ED with c/o of sudden right arm weakness. Patient transferred to [facility] s/p TPA.  MRI showed small scattered foci of acute/early subacute infarction are present within the left posterosuperior frontal and parietal lobes inclusive of precentral gyrus. No hemorrhage or mass effect.  Clinical Impression  Pt admitted with/for right arm weakness and found to have stroke per MRI.  Pt generally supervision to independent, but does show a tendency to have episodes of mild instability when balance is challenged..  Pt currently limited functionally due to the problems listed below.  (see problems list.)  Pt will benefit from PT to maximize function and safety to be able to get home safely with available assist.  I have discussed the current level of function with the patient.  He acknowledges that he is not at baseline, but that if he asks for help and makes safe decisions, he should be safe at home alone.        Follow Up Recommendations Outpatient PT;Other (comment)(May not need PT after continued improvement)    Equipment Recommendations  None recommended by PT    Recommendations for Other Services       Precautions / Restrictions Precautions Precautions: Fall(minimal risk) Restrictions Weight Bearing Restrictions: No      Mobility  Bed Mobility Overal bed mobility: Needs Assistance Bed Mobility: Sit to Supine     Supine to sit: Supervision Sit to supine: Supervision   General bed mobility comments: no assist required  Transfers Overall transfer level: Needs assistance Equipment used: None Transfers: Sit to/from Stand Sit to Stand: Supervision         General transfer comment: no assist required  Ambulation/Gait Ambulation/Gait  assistance: Supervision Gait Distance (Feet): 400 Feet Assistive device: None Gait Pattern/deviations: Step-through pattern   Gait velocity interpretation: >2.62 ft/sec, indicative of community ambulatory General Gait Details: generally steady with infrequent episodes of step or drift L to recover balance when scanning or abruptly changing direction.  Stairs            Wheelchair Mobility    Modified Rankin (Stroke Patients Only) Modified Rankin (Stroke Patients Only) Pre-Morbid Rankin Score: No symptoms Modified Rankin: Moderate disability     Balance Overall balance assessment: Needs assistance   Sitting balance-Leahy Scale: Normal     Standing balance support: No upper extremity supported;During functional activity Standing balance-Leahy Scale: Good                               Pertinent Vitals/Pain Pain Assessment: No/denies pain    Home Living Family/patient expects to be discharged to:: Private residence Living Arrangements: Alone Available Help at Discharge: Family Type of Home: House Home Access: Level entry     Home Layout: One level Home Equipment: Grab bars - tub/shower      Prior Function Level of Independence: Independent         Comments: driving, retired      Journalist, newspaper   Dominant Hand: Left    Extremity/Trunk Assessment   Upper Extremity Assessment Upper Extremity Assessment: RUE deficits/detail RUE Deficits / Details: proximal strength> distal, grossly 3+/5 shoulder, elbow, 3-/5 forearm, wrist and 2-/5 hand flexion/extension  RUE Sensation: WNL RUE Coordination: decreased fine motor  Lower Extremity Assessment Lower Extremity Assessment: Defer to PT evaluation       Communication   Communication: No difficulties  Cognition Arousal/Alertness: Awake/alert Behavior During Therapy: WFL for tasks assessed/performed Overall Cognitive Status: Within Functional Limits for tasks assessed                                  General Comments: short blessed test completed: 5/28, normal       General Comments      Exercises Other Exercises Other Exercises: provided squeeze ball for R hand exercises    Assessment/Plan    PT Assessment Patient needs continued PT services  PT Problem List Decreased strength;Decreased activity tolerance;Decreased balance;Decreased mobility       PT Treatment Interventions Gait training;Therapeutic activities;Stair training;Functional mobility training;Balance training    PT Goals (Current goals can be found in the Care Plan section)  Acute Rehab PT Goals Patient Stated Goal: independent at home PT Goal Formulation: With patient Time For Goal Achievement: 02/08/19 Potential to Achieve Goals: Good Additional Goals Additional Goal #1: DGI at seast >=21 to correlated to lower fall risk    Frequency Min 3X/week   Barriers to discharge        Co-evaluation               AM-PAC PT "6 Clicks" Mobility  Outcome Measure Help needed turning from your back to your side while in a flat bed without using bedrails?: None Help needed moving from lying on your back to sitting on the side of a flat bed without using bedrails?: None Help needed moving to and from a bed to a chair (including a wheelchair)?: None Help needed standing up from a chair using your arms (e.g., wheelchair or bedside chair)?: None Help needed to walk in hospital room?: A Little Help needed climbing 3-5 steps with a railing? : A Little 6 Click Score: 22    End of Session   Activity Tolerance: Patient tolerated treatment well Patient left: in bed;with call bell/phone within reach;Other (comment)(with OT)   PT Visit Diagnosis: Unsteadiness on feet (R26.81)    Time: 7673-4193 PT Time Calculation (min) (ACUTE ONLY): 24 min   Charges:   PT Evaluation $PT Eval Low Complexity: 1 Low PT Treatments $Gait Training: 8-22 mins        02/01/2019  Donnella Sham, PT Acute  Rehabilitation Services [phone]  (pager) [phone]  (office)  Tessie Fass Delta Deshmukh 02/01/2019, 3:44 PM

## 2019-02-01 NOTE — Progress Notes (Signed)
  Progress Note    02/01/2019 4:06 PM * No surgery date entered *  Subjective:  Right hand remains weak  Vitals:   02/01/19 1111 02/01/19 1540  BP: 100/66 123/77  Pulse: 79 77  Resp: 13 20  Temp: 98 F (36.7 C) 98.4 F (36.9 C)  SpO2: 97% 96%    Physical Exam: [id number] Right hand weakness Palpable radial pulses  Summary: Right Carotid: Velocities in the right ICA are consistent with a 1-39% stenosis.  Left Carotid: Velocities in the left ICA are consistent with a 40-59% stenosis,               low range of scale, however, plaque appears to fill a significant               part of the vessel.  Vertebrals:  Bilateral vertebral arteries demonstrate antegrade flow. Subclavians: Normal flow hemodynamics were seen in bilateral subclavian              arteries.  Intake/Output Summary (Last 24 hours) at 02/01/2019 1606 Last data filed at 02/01/2019 0500 Gross per 24 hour  Intake 205.18 ml  Output 825 ml  Net -619.82 ml     Assessment/plan:  73 y.o. male is here with left hemispheric embolic cva. 53-61% stenosis left carotid by duplex with CTA demonstrating significant soft appearing plaque. TEE pending, will f/u with Neurology recommendations.     Kendyll Huettner C. Donzetta Matters, MD [facility] Office: [phone] Pager: [phone]  02/01/2019 4:06 PM

## 2019-02-01 NOTE — Progress Notes (Signed)
VASCULAR LAB PRELIMINARY  PRELIMINARY  PRELIMINARY  PRELIMINARY  Carotid duplex completed.    Preliminary report:  See CV Proc  Hend Mccarrell, RVT 02/01/2019, 11:09 AM

## 2019-02-01 NOTE — Progress Notes (Addendum)
STROKE TEAM NOTE  SUBJECTIVE Stable neuro status. MRI positive for stroke. needs embolic work up.  OBJECTIVE Vitals:   02/01/19 0200 02/01/19 0300 02/01/19 0340 02/01/19 0710  BP:    125/73  Pulse: 73 76  67  Resp: 17 15  15   Temp:   97.7 F (36.5 C) 97.7 F (36.5 C)  TempSrc:   Oral Axillary  SpO2: 96% 94%  96%  Weight:      Height:        CBC: No results for input(s): WBC, NEUTROABS, HGB, HCT, MCV, PLT in the last 168 hours.  Basic Metabolic Panel: No results for input(s): NA, K, CL, CO2, GLUCOSE, BUN, CREATININE, CALCIUM, MG, PHOS in the last 168 hours.  Lipid Panel:     Component Value Date/Time   CHOL 120 01/31/2019 0513   TRIG 137 01/31/2019 0513   HDL 22 (L) 01/31/2019 0513   CHOLHDL 5.5 01/31/2019 0513   VLDL 27 01/31/2019 0513   LDLCALC 71 01/31/2019 0513   HgbA1c:  Lab Results  Component Value Date   HGBA1C 6.6 (H) 01/31/2019   IMAGING CTA of H/N at [facility]  01/30/2019  No LVO but cervical carotid artery atherosclerosis, prominent soft plaque in the left carotid bulb, atherosclerosis with mild right and moderate left cavernous ICA stenosis and mod-severe right P1 stenosis. Moderate distal left vertebral artery stenosis.   CT HEAD WO CONTRAST  01/30/2019 Was negative for acute event at [facility], moderate small-vessel ischemic changes  Mr Brain Wo Contrast 01/31/2019 1. Small scattered foci of acute/early subacute infarction are present within the left posterosuperior frontal and parietal lobes inclusive of precentral gyrus. No hemorrhage or mass effect. 2. Moderate chronic microvascular ischemic changes and volume loss of the brain. 3. Paranasal sinus disease with fluid levels which may represent acute sinusitis. Partial bilateral mastoid opacification.   Carotid Doppler   02/01/2019 There is 1-39% bilateral ICA stenosis. Vertebral artery flow is antegrade.    Transthoracic Echocardiogram  1. The left ventricle has normal systolic function with an ejection  fraction of 60-65%. The cavity size was normal. Left ventricular diastolic Doppler parameters are consistent with impaired relaxation.  2. The right ventricle has normal systolic function. The cavity was normal. There is no increase in right ventricular wall thickness.  3. The mitral valve is normal in structure.  4. The tricuspid valve is normal in structure.  5. The aortic valve is tricuspid.  6. The pulmonic valve was normal in structure.  7. Normal LV systolic function; mild diastolic dysfunction.  8. The interatrial septum appears to be lipomatous.    Physical Exam  pleasant elderly   male not in distress. . Afebrile. Head is nontraumatic. Neck is supple without bruit.    Cardiac exam no murmur or gallop. Lungs are clear to auscultation. Distal pulses are well felt. Neurological Exam ;  Awake alert oriented 3 with normal speech and language function. No aphasia, apraxia dysarthria. Extraocular moments are full range without nystagmus. Blinks to threat bilaterally. Fundi not visualized. Face is symmetric without weakness. Tongue midline. Motor system exam reveals no drift but significant weakness of right grip and intrinsic hand muscles. Orbits left over right upper extremity. Symmetric and equal lower eczema to stand. Dependent for this is symmetric. Sensation is intact. Plantars are downgoing. Gait not tested.  ASSESSMENT/PLAN [name] with history of diabetes, HTN, TIA, GI Bleed, HLD, depression presenting with sudden right arm weakness.  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   MRA head - not performed (See CTA H&N)  CTA H&N - no LVO [name]). CTA of H/N at [facility] showed No LVO but cervical carotid artery atherosclerosis, prominent soft plaque in the left carotid bulb, atherosclerosis with mild right and moderate left cavernous ICA stenosis and mod-severe right P1 stenosis. Moderate distal left vertebral artery stenosis.   Prominent soft plaque in the left carotid bulb on imaging but negative carotid dopplers. Do not feel carotid intervention necessary  TEE to look for embolic source. Arranged with [facility] for Wed.  If positive for PFO (patent foramen ovale), check bilateral lower extremity venous dopplers to rule out DVT as possible source of stroke. (I have made patient NPO after midnight Wed).  If TEE negative, a [place] electrophysiologist will consult and consider placement of an implantable loop recorder to evaluate for atrial fibrillation as etiology of stroke. This has been explained to patient/family by Dr. Leonie Man and they are agreeable.]  LDL - 71  HgbA1c - 6.6  UDS - not performed  VTE prophylaxis - SCDs  No antithrombotic prior to admission, now on aspirin 325 mg daily. Discharge recommendations pending   Therapy recommendations:  OP PT and OT  Disposition:  Pending  Hypertension  Stable . Permissive hypertension - Keep SBP < 180 mmHg post tPA . Long-term BP goal normotensive  Hyperlipidemia  Lipid lowering medication PTA:  none  LDL 71, goal < 70  Add statin - lipitor 40  Continue statin at discharge  Diabetes  HgbA1c 6.6, at goal < 7.0  Other Stroke Risk Factors  Advanced age  No stroke hx  Other Active Problems  Recent flu like sxs - on Tamiflu - droplet precautions   Hospital day # 2  Burnetta Sabin, MSN, APRN, ANVP-BC, AGPCNP-BC Advanced Practice Stroke Nurse [place] for Schedule & Pager information 02/01/2019  3:06 PM  I have personally obtained history,examined this patient, reviewed notes, independently viewed imaging studies, participated in medical decision making and plan of care.ROS completed by me personally and pertinent positives fully documented  I have made any additions or clarifications directly to the above note. Agree with note above. Await TEE. Carotid ultrasound shows no significant left carotid plaques hence I doubt this is embolic from carotid disease. Greater than 50% time during this 25 minute visit was spent on counseling and coordination of care about his embolic stroke and answering questions  Antony Contras, MD Medical Director [place] Pager: [phone] 02/01/2019 3:27 PM   To contact Stroke Continuity provider, please refer to [email or web address] After hours, contact General Neurology

## 2019-02-01 NOTE — Evaluation (Signed)
Occupational Therapy Evaluation Patient Details Name: Jon Nelson MRN: [id number] DOB: 23-Jul-1946 Today's Date: 02/01/2019    History of Present Illness Jon Nelson is an 73 y.o. male  With PMH diabetes, HTN, depression who presented to [facility] ED with c/o of sudden right arm weakness. Patient transferred to [facility] s/p TPA.  MRI showed small scattered foci of acute/early subacute infarction are present within the left posterosuperior frontal and parietal lobes inclusive of precentral gyrus. No hemorrhage or mass effect.   Clinical Impression   PTA patient reports independent and driving.  Admitted for above and limited by impaired balance, decreased activity tolerance, and R sided weakness (distal >proximal).  Patient currently requires min assist for UB/LB ADLs, supervision for transfers, and setup assist for grooming. Patient educated on safety and compensatory techniques, recommendations to have assist initially with IADLs and pt agreeable. Patient will benefit from continued OT services while admitted and after dc at OP OT level in order to optimize independence and safety with ADLs/mobility. Next session to focus on shower transfers, compensatory techniques for dressing, R hand exercises, and safety.       Follow Up Recommendations  Outpatient OT;Supervision - Intermittent    Equipment Recommendations  3 in 1 bedside commode    Recommendations for Other Services       Precautions / Restrictions Precautions Precautions: Fall(minimal risk) Restrictions Weight Bearing Restrictions: No      Mobility Bed Mobility Overal bed mobility: Needs Assistance Bed Mobility: Sit to Supine     Supine to sit: Supervision Sit to supine: Supervision   General bed mobility comments: no assist required  Transfers Overall transfer level: Needs assistance Equipment used: None Transfers: Sit to/from Stand Sit to Stand: Supervision         General transfer comment: no assist required     Balance Overall balance assessment: Needs assistance   Sitting balance-Leahy Scale: Normal     Standing balance support: No upper extremity supported;During functional activity Standing balance-Leahy Scale: Good                             ADL either performed or assessed with clinical judgement   ADL Overall ADL's : Needs assistance/impaired     Grooming: Standing;Supervision/safety Grooming Details (indicate cue type and reason): able to use 1 handed techniques, but assist required to open containers  Upper Body Bathing: Minimal assistance;Sitting   Lower Body Bathing: Supervison/ safety;Sit to/from stand   Upper Body Dressing : Minimal assistance;Sitting   Lower Body Dressing: Minimal assistance;Sit to/from stand   Toilet Transfer: Ambulation;Supervision/safety           Functional mobility during ADLs: Supervision/safety       Vision Baseline Vision/History: No visual deficits Patient Visual Report: No change from baseline Vision Assessment?: No apparent visual deficits     Perception     Praxis      Pertinent Vitals/Pain Pain Assessment: No/denies pain     Hand Dominance Left   Extremity/Trunk Assessment Upper Extremity Assessment Upper Extremity Assessment: RUE deficits/detail RUE Deficits / Details: proximal strength> distal, grossly 3+/5 shoulder, elbow, 3-/5 forearm, wrist and 2-/5 hand flexion/extension  RUE Sensation: WNL RUE Coordination: decreased fine motor   Lower Extremity Assessment Lower Extremity Assessment: Defer to PT evaluation       Communication Communication Communication: No difficulties   Cognition Arousal/Alertness: Awake/alert Behavior During Therapy: WFL for tasks assessed/performed Overall Cognitive Status: Within Functional Limits for tasks assessed  General Comments: short blessed test completed: 5/28, normal    General Comments       Exercises  Exercises: Other exercises Other Exercises Other Exercises: provided squeeze ball for R hand exercises , reviewed grasp/release exercises and hand stretching exercises    Shoulder Instructions      Home Living Family/patient expects to be discharged to:: Private residence Living Arrangements: Alone Available Help at Discharge: Family Type of Home: House Home Access: Level entry     Home Layout: One level     Bathroom Shower/Tub: Teacher, early years/pre: Standard     Home Equipment: Grab bars - tub/shower      Lives With: Alone    Prior Functioning/Environment Level of Independence: Independent        Comments: driving, retired         Secretary/administrator Problem List: Decreased strength;Decreased coordination;Decreased safety awareness;Decreased knowledge of use of DME or AE;Impaired UE functional use;Decreased range of motion;Decreased activity tolerance      OT Treatment/Interventions: Self-care/ADL training;DME and/or AE instruction;Therapeutic activities;Patient/family education;Balance training;Neuromuscular education    OT Goals(Current goals can be found in the care plan section) Acute Rehab OT Goals Patient Stated Goal: independent at home OT Goal Formulation: With patient Time For Goal Achievement: 02/15/19 Potential to Achieve Goals: Good  OT Frequency: Min 2X/week   Barriers to D/C:            Co-evaluation              AM-PAC OT "6 Clicks" Daily Activity     Outcome Measure Help from another person eating meals?: None Help from another person taking care of personal grooming?: A Little Help from another person toileting, which includes using toliet, bedpan, or urinal?: A Little Help from another person bathing (including washing, rinsing, drying)?: A Little Help from another person to put on and taking off regular upper body clothing?: A Little Help from another person to put on and taking off regular lower body clothing?: A Little 6 Click  Score: 19   End of Session Nurse Communication: Mobility status  Activity Tolerance: Patient tolerated treatment well Patient left: in bed;with call bell/phone within reach;with bed alarm set  OT Visit Diagnosis: Other abnormalities of gait and mobility (R26.89);Hemiplegia and hemiparesis;Muscle weakness (generalized) (M62.81) Hemiplegia - Right/Left: Right Hemiplegia - dominant/non-dominant: Non-Dominant Hemiplegia - caused by: Cerebral infarction                Time: 4010-2725 OT Time Calculation (min): 22 min Charges:  OT General Charges $OT Visit: 1 Visit OT Evaluation $OT Eval Moderate Complexity: 1 Mod  Delight Stare, OT Acute Rehabilitation Services Pager [phone] Office [phone]    Delight Stare 02/01/2019, 1:25 PM

## 2019-02-01 NOTE — Evaluation (Signed)
Speech Language Pathology Evaluation Patient Details Name: Jon Nelson MRN: [id number] DOB: 26-Apr-1946 Today's Date: 02/01/2019 Time: 8413-2440 SLP Time Calculation (min) (ACUTE ONLY): 20 min  Problem List:  Patient Active Problem List   Diagnosis Date Noted  . Diabetes ([place])   . HTN (hypertension)   . Stroke [facility]) 01/30/2019   Past Medical History:  Past Medical History:  Diagnosis Date  . Diabetes ([place])   . GI bleed   . HLD (hyperlipidemia)   . HTN (hypertension)   . TIA (transient ischemic attack)    Past Surgical History: History reviewed. No pertinent surgical history. HPI:  Jon Nelson is an 73 y.o. male  With PMH diabetes, HTN, depression who presented to [facility] ED with c/o of sudden right arm weakness. Patient transferred to [facility] s/p TPA.  MRI showed small scattered foci of acute/early subacute infarction are present within the left posterosuperior frontal and parietal lobes inclusive of precentral gyrus. No hemorrhage or mass effect. Patient passed [place] and on a diet.    Assessment / Plan / Recommendation Clinical Impression  At onset of stroke, pt reports he had numbness loss of strength of right arm and hand with slurring of speech or changes in language and cognition. He feels his right arm has improved with only residual numbness in his right fingers. Patient able to communicate at the conversational level. His speech and language were WFL in all areas tested. Speech therapy to sign off at this time.     SLP Assessment  SLP Recommendation/Assessment: Patient does not need any further Speech Lanaguage Pathology Services SLP Visit Diagnosis: Cognitive communication deficit (R41.841)    Follow Up Recommendations  None               SLP Evaluation Cognition  Overall Cognitive Status: Within Functional Limits for tasks assessed Arousal/Alertness: Awake/alert Orientation Level: Oriented X4 Attention: Focused Focused Attention: Appears  intact Memory: Appears intact Awareness: Appears intact Problem Solving: Appears intact Executive Function: Reasoning Reasoning: Appears intact Safety/Judgment: Appears intact       Comprehension  Auditory Comprehension Overall Auditory Comprehension: Appears within functional limits for tasks assessed Yes/No Questions: Within Functional Limits Commands: Within Functional Limits Conversation: Complex Visual Recognition/Discrimination Discrimination: Within Function Limits Reading Comprehension Reading Status: Within funtional limits    Expression Expression Primary Mode of Expression: Verbal Verbal Expression Overall Verbal Expression: Appears within functional limits for tasks assessed Initiation: No impairment Repetition: No impairment Naming: No impairment Pragmatics: No impairment Written Expression Dominant Hand: Right Written Expression: Within Functional Limits   Oral / Motor  Oral Motor/Sensory Function Overall Oral Motor/Sensory Function: Within functional limits Motor Speech Overall Motor Speech: Appears within functional limits for tasks assessed Respiration: Within functional limits Phonation: Normal Resonance: Within functional limits Articulation: Within functional limitis Intelligibility: Intelligible Motor Planning: Witnin functional limits Motor Speech Errors: Not applicable   GO                   Charlynne Cousins Taylar Hartsough, MA, CCC-SLP 02/01/2019 10:22 AM

## 2019-02-02 ENCOUNTER — Encounter (HOSPITAL_COMMUNITY): Payer: Self-pay

## 2019-02-02 DIAGNOSIS — E785 Hyperlipidemia, unspecified: Secondary | ICD-10-CM | POA: Diagnosis present

## 2019-02-02 DIAGNOSIS — J111 Influenza due to unidentified influenza virus with other respiratory manifestations: Secondary | ICD-10-CM | POA: Diagnosis present

## 2019-02-02 LAB — GLUCOSE, CAPILLARY
GLUCOSE-CAPILLARY: 128 mg/dL — AB (ref 70–99)
Glucose-Capillary: 115 mg/dL — ABNORMAL HIGH (ref 70–99)
Glucose-Capillary: 97 mg/dL (ref 70–99)
Glucose-Capillary: 131 mg/dL — ABNORMAL HIGH (ref 70–99)
Glucose-Capillary: 120 mg/dL — ABNORMAL HIGH (ref 70–99)

## 2019-02-02 NOTE — Progress Notes (Signed)
   I have discussed with Dr. Leonie Man plan will be for medical management.  Does not appear to have significant stenosis by duplex however lesion appears ominous by CT angios.  Should he have another symptom would consider carotid endarterectomy or stenting otherwise we will follow-up in the office in 6 months with repeat carotid duplex.  Jon Nelson C. Donzetta Matters, MD [facility] Office: [phone] Pager: [phone]

## 2019-02-02 NOTE — Progress Notes (Signed)
Physical Therapy Treatment Patient Details Name: Jon Nelson MRN: [id number] DOB: [birth year] Today's Date: 02/02/2019    History of Present Illness Jon Nelson is an 73 y.o. male  With PMH diabetes, HTN, depression who presented to [facility] ED with c/o of sudden right arm weakness. Patient transferred to [facility] s/p TPA.  MRI showed small scattered foci of acute/early subacute infarction are present within the left posterosuperior frontal and parietal lobes inclusive of precentral gyrus. No hemorrhage or mass effect.    PT Comments    Pt presented in bed and agreeable to gait training and therex activities. Pt tolerated tx well and stated he had no pain and that he just wanted to get his RUE stronger again. Pt attempted to donn face mask and required assistance to complete task. Pt performed the DGI and scored a 20/24. Pt score may improve with better cueing and practice with next trial. Pt completed standing therex with no use of BUE and managed higher level balance tasks with little difficulty. Plan to progress pt higher level balance activities and continue to work on gait deficits related to fall prevention. PT discharge plan of outpatient PT is appropriate based on current progress. Pt left with call bell/phone, all other needs within reach, and chair alarm set.   Follow Up Recommendations  Outpatient PT;Other (comment)     Equipment Recommendations  None recommended by PT    Recommendations for Other Services       Precautions / Restrictions Precautions Precautions: Fall Restrictions Weight Bearing Restrictions: No    Mobility  Bed Mobility Overal bed mobility: Needs Assistance Bed Mobility: Supine to Sit     Supine to sit: Supervision     General bed mobility comments: no assist required, supervision for safety  Transfers Overall transfer level: Modified independent Equipment used: None Transfers: Sit to/from Stand Sit to Stand: Modified independent (Device/Increase  time)         General transfer comment: no assist required and is able to sit to/from stand without BUE use  Ambulation/Gait Ambulation/Gait assistance: Min guard Gait Distance (Feet): 400 Feet Assistive device: None Gait Pattern/deviations: Step-through pattern   Gait velocity interpretation: >2.62 ft/sec, indicative of community ambulatory General Gait Details: generally steady with infrequent episodes of step or drift L to recover balance when scanning or abruptly changing direction.   Stairs Stairs: Yes Stairs assistance: Supervision Stair Management: No rails;One rail Left;One rail Right;Alternating pattern Number of Stairs: 10 General stair comments: Pt initially used rails for stair, but upon command to only use if necessary he was able to perform stairs without rails and no LOB   Wheelchair Mobility    Modified Rankin (Stroke Patients Only) Modified Rankin (Stroke Patients Only) Pre-Morbid Rankin Score: No symptoms Modified Rankin: Moderate disability     Balance Overall balance assessment: Needs assistance   Sitting balance-Leahy Scale: Normal     Standing balance support: No upper extremity supported;During functional activity Standing balance-Leahy Scale: Good               High level balance activites: Side stepping;Direction changes;Turns;Head turns High Level Balance Comments: Pt able to complete higher level balance tasks with no LOB, minor impairments noted with turns and obstacle negotiation Standardized Balance Assessment Standardized Balance Assessment : Dynamic Gait Index   Dynamic Gait Index Level Surface: Normal Change in Gait Speed: Normal Gait with Horizontal Head Turns: Normal Gait with Vertical Head Turns: Normal Gait and Pivot Turn: Moderate Impairment Step Over Obstacle: Normal Step Around Obstacles: Moderate  Impairment Steps: Normal Total Score: 20      Cognition Arousal/Alertness: Awake/alert Behavior During Therapy:  WFL for tasks assessed/performed Overall Cognitive Status: Within Functional Limits for tasks assessed                                        Exercises General Exercises - Lower Extremity Mini-Sqauts: 10 reps;AROM;Both;Standing(Full ROM squat to EOB with no LOB and no use of UE) Other Exercises Other Exercises: tandem stand 1 x 30 seconds each side Other Exercises: eyes close narrow stance 2 x 30 seconds    General Comments        Pertinent Vitals/Pain Pain Assessment: No/denies pain    Home Living                      Prior Function            PT Goals (current goals can now be found in the care plan section) Acute Rehab PT Goals Patient Stated Goal: independent at home PT Goal Formulation: With patient Potential to Achieve Goals: Good Additional Goals Additional Goal #1: DGI at seast >=21 to correlated to lower fall risk    Frequency    Min 3X/week      PT Plan      Co-evaluation              AM-PAC PT "6 Clicks" Mobility   Outcome Measure  Help needed turning from your back to your side while in a flat bed without using bedrails?: None Help needed moving from lying on your back to sitting on the side of a flat bed without using bedrails?: None Help needed moving to and from a bed to a chair (including a wheelchair)?: None Help needed standing up from a chair using your arms (e.g., wheelchair or bedside chair)?: None Help needed to walk in hospital room?: A Little Help needed climbing 3-5 steps with a railing? : A Little 6 Click Score: 22    End of Session   Activity Tolerance: Patient tolerated treatment well Patient left: in chair;with chair alarm set;with call bell/phone within reach Nurse Communication: Mobility status PT Visit Diagnosis: Unsteadiness on feet (R26.81)     Time: 1040-1108 PT Time Calculation (min) (ACUTE ONLY): 28 min  Charges:  $Gait Training: 8-22 mins $Therapeutic Exercise: 8-22 mins                      Maryelizabeth Kaufmann, SPTA   Maryelizabeth Kaufmann 02/02/2019, 11:47 AM

## 2019-02-02 NOTE — Progress Notes (Signed)
STROKE TEAM NOTE  SUBJECTIVE Stable neuro status. MRI positive for stroke. needs embolic work up.TEE is scheduled for tomorrow  OBJECTIVE Vitals:   02/02/19 0427 02/02/19 0800 02/02/19 1131 02/02/19 1533  BP: (!) 125/57 126/72 122/81 123/75  Pulse: 70  88 89  Resp: 13  20 17   Temp: 98.3 F (36.8 C)     TempSrc: Oral     SpO2: 98%  97% 97%  Weight:      Height:        CBC: No results for input(s): WBC, NEUTROABS, HGB, HCT, MCV, PLT in the last 168 hours.  Basic Metabolic Panel: No results for input(s): NA, K, CL, CO2, GLUCOSE, BUN, CREATININE, CALCIUM, MG, PHOS in the last 168 hours.  Lipid Panel:     Component Value Date/Time   CHOL 120 01/31/2019 0513   TRIG 137 01/31/2019 0513   HDL 22 (L) 01/31/2019 0513   CHOLHDL 5.5 01/31/2019 0513   VLDL 27 01/31/2019 0513   LDLCALC 71 01/31/2019 0513   HgbA1c:  Lab Results  Component Value Date   HGBA1C 6.6 (H) 01/31/2019   IMAGING CTA of H/N at [facility]  01/30/2019  No LVO but cervical carotid artery atherosclerosis, prominent soft plaque in the left carotid bulb, atherosclerosis with mild right and moderate left cavernous ICA stenosis and mod-severe right P1 stenosis. Moderate distal left vertebral artery stenosis.   CT HEAD WO CONTRAST  01/30/2019 Was negative for acute event at [facility], moderate small-vessel ischemic changes  Mr Brain Wo Contrast 01/31/2019 1. Small scattered foci of acute/early subacute infarction are present within the left posterosuperior frontal and parietal lobes inclusive of precentral gyrus. No hemorrhage or mass effect. 2. Moderate chronic microvascular ischemic changes and volume loss of the brain. 3. Paranasal sinus disease with fluid levels which may represent acute sinusitis. Partial bilateral mastoid opacification.   Carotid Doppler   02/01/2019 There is 1-39% bilateral ICA stenosis. Vertebral artery flow is antegrade.    Transthoracic Echocardiogram  1. The left ventricle has normal systolic  function with an ejection fraction of 60-65%. The cavity size was normal. Left ventricular diastolic Doppler parameters are consistent with impaired relaxation.  2. The right ventricle has normal systolic function. The cavity was normal. There is no increase in right ventricular wall thickness.  3. The mitral valve is normal in structure.  4. The tricuspid valve is normal in structure.  5. The aortic valve is tricuspid.  6. The pulmonic valve was normal in structure.  7. Normal LV systolic function; mild diastolic dysfunction.  8. The interatrial septum appears to be lipomatous.    Physical Exam  pleasant elderly   male not in distress. . Afebrile. Head is nontraumatic. Neck is supple without bruit.    Cardiac exam no murmur or gallop. Lungs are clear to auscultation. Distal pulses are well felt. Neurological Exam ;  Awake alert oriented 3 with normal speech and language function. No aphasia, apraxia dysarthria. Extraocular moments are full range without nystagmus. Blinks to threat bilaterally. Fundi not visualized. Face is symmetric without weakness. Tongue midline. Motor system exam reveals no drift but significant weakness of right grip and intrinsic hand muscles. Orbits left over right upper extremity. Symmetric and equal lower extremity strength DTRs are symmetric. Sensation is intact. Plantars are downgoing. Gait not tested.  ASSESSMENT/PLAN [name] with history of diabetes, HTN, TIA, GI Bleed, HLD, depression presenting with sudden right arm weakness.  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   MRA head - not performed (See CTA H&N)  CTA H&N - no LVO [name]). CTA of H/N at [facility] showed No LVO but cervical carotid artery atherosclerosis, prominent soft plaque in the left carotid bulb, atherosclerosis with mild right and moderate left cavernous ICA stenosis and mod-severe right P1 stenosis. Moderate distal left vertebral artery stenosis.   Prominent soft plaque in the left carotid bulb on imaging but negative carotid dopplers. Do not feel carotid intervention necessary  TEE to look for embolic source. Arranged with [facility] for Wed.  If positive for PFO (patent foramen ovale), check bilateral lower extremity venous dopplers to rule out DVT as possible source of stroke. (I have made patient NPO after midnight Wed).  If TEE negative, a [place] electrophysiologist will consult and consider placement of an implantable loop recorder to evaluate for atrial fibrillation as etiology of stroke. This has been explained to patient/family by Dr. Leonie Man and they are agreeable.]  LDL - 71  HgbA1c - 6.6  UDS - not performed  VTE prophylaxis - SCDs  No antithrombotic prior to admission, now on aspirin 325 mg daily. Discharge recommendations pending   Therapy recommendations:  OP PT and OT  Disposition:  Pending  Hypertension  Stable . Permissive hypertension - Keep SBP < 180 mmHg post tPA . Long-term BP goal normotensive  Hyperlipidemia  Lipid lowering medication PTA:  none  LDL 71, goal < 70  Add statin - lipitor 40  Continue statin at discharge  Diabetes  HgbA1c 6.6, at goal < 7.0  Other Stroke Risk Factors  Advanced age  No stroke hx  Other Active Problems  Recent flu like sxs - on Tamiflu - droplet precautions   Hospital day # 3   . Await TEE. Carotid ultrasound shows no significant left carotid plaques hence I doubt this is embolic from carotid disease.appreciate  vascular surgery consult patient does not need any carotid surgery at this time Greater than 50% time during this 25 minute visit was spent on counseling and coordination of care about his embolic stroke and answering questions  Antony Contras, MD Medical Director [facility] Pager: [phone] 02/02/2019 4:36 PM   To contact Stroke Continuity provider, please refer to [email or web address] After hours, contact General Neurology

## 2019-02-02 NOTE — Progress Notes (Addendum)
Occupational Therapy Treatment Patient Details Name: Jon Nelson MRN: [id number] DOB: 16-Feb-1946 Today's Date: 02/02/2019    History of present illness Jon Nelson is an 73 y.o. male  With PMH diabetes, HTN, depression who presented to [facility] ED with c/o of sudden right arm weakness. Patient transferred to [facility] s/p TPA.  MRI showed small scattered foci of acute/early subacute infarction are present within the left posterosuperior frontal and parietal lobes inclusive of precentral gyrus. No hemorrhage or mass effect.   OT comments  Patient seated in recliner and agreeable to OT.  Completed mini cog assessment, scoring 3/5 and only recalling 1/3 words provided; further cognitive assessments with speech therapy and pill box test recommended.  Patient requires increased cueing for safety, impulsive at times with poor attention to task, problem solving, line mgmt (with assist) and awareness to deficits.  Reporting throughout session, "I'll just figure it out" in regards to ADL compensatory techniques, adpatations and IADLs.  Encouraged patient to speak to family/friends for assistance with IADLs, educated on compensatory techniques with hesitant to adjust way of completing ADLs at home.  Provided memory strategies handout to assist patient with STM (as pt reports baseline) and reviewed. Will continue to follow, at this time recommend assistance for medication management, driving, and IADLs.    Follow Up Recommendations  Outpatient OT;Supervision - Intermittent    Equipment Recommendations  3 in 1 bedside commode    Recommendations for Other Services Speech consult    Precautions / Restrictions Precautions Precautions: Fall Restrictions Weight Bearing Restrictions: No       Mobility Bed Mobility Overal bed mobility: Needs Assistance Bed Mobility: Supine to Sit     Supine to sit: Supervision     General bed mobility comments: OOB upon entry   Transfers Overall transfer level:  Modified independent Equipment used: None Transfers: Sit to/from Stand Sit to Stand: Supervision         General transfer comment: supervision for line mgmt and safety     Balance Overall balance assessment: Needs assistance   Sitting balance-Leahy Scale: Normal     Standing balance support: No upper extremity supported;During functional activity Standing balance-Leahy Scale: Good               High level balance activites: Side stepping;Direction changes;Turns;Head turns High Level Balance Comments: Pt able to complete higher level balance tasks with no LOB, minor impairments noted with turns and obstacle negotiation Standardized Balance Assessment Standardized Balance Assessment : Dynamic Gait Index   Dynamic Gait Index Level Surface: Normal Change in Gait Speed: Normal Gait with Horizontal Head Turns: Normal Gait with Vertical Head Turns: Normal Gait and Pivot Turn: Moderate Impairment Step Over Obstacle: Normal Step Around Obstacles: Moderate Impairment Steps: Normal Total Score: 20     ADL either performed or assessed with clinical judgement   ADL Overall ADL's : Needs assistance/impaired     Grooming: Standing;Supervision/safety;Wash/dry hands Grooming Details (indicate cue type and reason): cueing required to use soap            Upper Body Dressing Details (indicate cue type and reason): verbally reviewed techniques for UB dressing    Lower Body Dressing Details (indicate cue type and reason): verbally reviewed compensatory techniques for LB dressing and safety, patient reports "I'll just figure it out, those buttons and zippers will be tough".   Toilet Transfer: Ambulation;Supervision/safety   Writer and Hygiene: Supervision/safety;Sit to/from stand       Functional mobility during ADLs: Supervision/safety;Cueing for safety General  ADL Comments: Pt impulsive during mobility today, poor awareness to safety/line mgmt  (even with therapist assisting).  Discussed plan for home with meals, ADL compensatory techniques and safety, with patient reports "I'll just figure it out", finally reporting "I'll have my neighbor bring me mcdonalds."       Vision   Vision Assessment?: No apparent visual deficits   Perception     Praxis      Cognition Arousal/Alertness: Awake/alert Behavior During Therapy: WFL for tasks assessed/performed Overall Cognitive Status: No family/caregiver present to determine baseline cognitive functioning Area of Impairment: Memory;Attention;Following commands;Problem solving;Awareness;Safety/judgement                   Current Attention Level: Sustained Memory: Decreased short-term memory Following Commands: Follows one step commands with increased time Safety/Judgement: Decreased awareness of safety;Decreased awareness of deficits Awareness: Emergent Problem Solving: Requires verbal cues General Comments: patient reports hx of short term memory decline, mini cog completed today scoring 3/5 (only recalling 1/3 words provided); throughout session required increased cueing to attend to and problem solve through tasks, decreased safety awareness and impulsive at times         Exercises Exercises: General Upper Extremity;Other exercises General Exercises - Upper Extremity Shoulder Flexion: AROM;Right;10 reps;Seated Shoulder Horizontal ABduction: AROM;Right;10 reps;Seated Shoulder Horizontal ADduction: AROM;Right;10 reps;Seated Wrist Flexion: AROM;Right;10 reps;Seated Wrist Extension: AROM;Right;10 reps;Seated Digit Composite Flexion: AROM;Right;10 reps;Seated Composite Extension: AROM;Right;10 reps;Seated General Exercises - Lower Extremity Mini-Sqauts: 10 reps;AROM;Both;Standing(Full ROM squat to EOB with no LOB and no use of UE) Other Exercises Other Exercises: tandem stand 1 x 30 seconds each side Other Exercises: eyes close narrow stance 2 x 30 seconds Other Exercises:  weightbearing through R hand on countertop/arm rest x 5 reps each    Shoulder Instructions       General Comments      Pertinent Vitals/ Pain       Pain Assessment: No/denies pain  Home Living                                          Prior Functioning/Environment              Frequency  Min 2X/week        Progress Toward Goals  OT Goals(current goals can now be found in the care plan section)  Progress towards OT goals: Progressing toward goals  Acute Rehab OT Goals Patient Stated Goal: independent at home OT Goal Formulation: With patient Time For Goal Achievement: 02/15/19 Potential to Achieve Goals: Good  Plan Discharge plan remains appropriate;Frequency remains appropriate    Co-evaluation                 AM-PAC OT "6 Clicks" Daily Activity     Outcome Measure   Help from another person eating meals?: None Help from another person taking care of personal grooming?: A Little Help from another person toileting, which includes using toliet, bedpan, or urinal?: A Little Help from another person bathing (including washing, rinsing, drying)?: A Little Help from another person to put on and taking off regular upper body clothing?: A Little Help from another person to put on and taking off regular lower body clothing?: A Little 6 Click Score: 19    End of Session    OT Visit Diagnosis: Other abnormalities of gait and mobility (R26.89);Hemiplegia and hemiparesis;Muscle weakness (generalized) (M62.81) Hemiplegia - Right/Left: Right Hemiplegia -  dominant/non-dominant: Non-Dominant Hemiplegia - caused by: Cerebral infarction   Activity Tolerance Patient tolerated treatment well   Patient Left in chair;with call bell/phone within reach;with chair alarm set   Nurse Communication Mobility status;Other (comment)(IV site leaking )        Time: 1364-3837 OT Time Calculation (min): 31 min  Charges: OT General Charges $OT Visit: 1  Visit OT Treatments $Self Care/Home Management : 8-22 mins $Neuromuscular Re-education: 8-22 mins  Delight Stare, OT Acute Rehabilitation Services Pager [phone] Office [phone]    Delight Stare 02/02/2019, 1:23 PM

## 2019-02-02 NOTE — Discharge Summary (Addendum)
Stroke Discharge Summary  Patient ID: Jon Nelson   MRN: [id number]      DOB: 1946-11-03  Date of Admission: 01/30/2019 Date of Discharge: 02/09/2019  Attending Physician:  Garvin Fila, MD, Stroke MD Consultant(s):    vascular surgery , cardiology for TEE, Thompson Grayer, MD (electrophysiology)  Patient's PCP:  Patient, No Pcp Per  DISCHARGE DIAGNOSIS:  Principal Problem:   Cryptogenic stroke ([place]) L brain infarcts s/p tPA Active Problems:   Diabetes ([place])   HTN (hypertension)   Hyperlipidemia   Flu   Past Medical History:  Diagnosis Date  . Diabetes ([place])   . GI bleed   . HLD (hyperlipidemia)   . HTN (hypertension)   . TIA (transient ischemic attack)    History reviewed. No pertinent surgical history.  Allergies as of 02/09/2019   No Known Allergies     Medication List    STOP taking these medications   oseltamivir 75 MG capsule Commonly known as:  TAMIFLU     TAKE these medications   apixaban 5 MG Tabs tablet Commonly known as:  ELIQUIS Take 1 tablet (5 mg total) by mouth 2 (two) times daily.   atorvastatin 10 MG tablet Commonly known as:  LIPITOR Take 10 mg by mouth daily.   metFORMIN 500 MG tablet Commonly known as:  GLUCOPHAGE Take 500 mg by mouth every morning.   vancomycin  IVPB Inject 1,250 mg into the vein every 12 (twelve) hours for 10 days. Indication:  MRSA bacteremia Last Day of Therapy:  02/19/2019 Labs - _0 /10/20 1032          LABORATORY STUDIES Lipid Panel    Component Value Date/Time   CHOL 120 01/31/2019 0513   TRIG 137 01/31/2019 0513   HDL 22 (L) 01/31/2019 0513   CHOLHDL 5.5 01/31/2019 0513   VLDL 27 01/31/2019 0513   LDLCALC 71 01/31/2019 0513   HgbA1C  Lab Results  Component Value Date   HGBA1C 6.6 (H) 01/31/2019   Urinalysis No results found for: COLORURINE, APPEARANCEUR, LABSPEC, PHURINE, GLUCOSEU, HGBUR, BILIRUBINUR, KETONESUR, PROTEINUR, UROBILINOGEN, NITRITE, LEUKOCYTESUR Urine Drug Screen No results found for: [place], [place], LABBENZ,  AMPHETMU, THCU, LABBARB  Alcohol Level No results found for: [facility]   SIGNIFICANT DIAGNOSTIC STUDIES CTA of H/N at [facility]  01/30/2019  No LVO but cervical carotid artery atherosclerosis, prominent soft plaque in the left carotid bulb, atherosclerosis with mild right and moderate left cavernous ICA stenosis and mod-severe right P1 stenosis. Moderate distal left vertebral artery stenosis.   CT HEAD WO CONTRAST  01/30/2019 Was negative for acute event at [facility], moderate small-vessel ischemic changes  Mr Brain Wo Contrast 01/31/2019 1. Small scattered foci of acute/early subacute infarction are present within  the left posterosuperior frontal and parietal lobes inclusive of precentral gyrus. No hemorrhage or mass effect. 2. Moderate chronic microvascular ischemic changes and volume loss of the brain. 3. Paranasal sinus disease with fluid levels which may represent acute sinusitis. Partial bilateral mastoid opacification.   Carotid Doppler   02/01/2019 There is 1-39% bilateral ICA stenosis. Vertebral artery flow is antegrade.    Transthoracic Echocardiogram  1. The left ventricle has normal systolic function with an ejection fraction of 60-65%. The cavity size was normal. Left ventricular diastolic Doppler parameters are consistent with impaired relaxation. 2. The right ventricle has normal systolic function. The cavity was normal. There is no increase in right ventricular wall thickness. 3. The mitral valve is normal in structure. 4. The tricuspid valve is normal in structure. 5. The aortic valve is tricuspid. 6. The pulmonic valve was normal in structure. 7. Normal LV systolic function; mild diastolic dysfunction. 8. The interatrial septum appears to be lipomatous  TEE: 02/03/2019  IMPRESSIONS  1. The left ventricle has normal systolic function, with an ejection fraction of 60-65%. The cavity size was normal.  2. The right ventricle has normal systolc function. The cavity was normal. There is no increase in right ventricular wall thickness.  3. The tricuspid valve was normal in structure.  4. The aortic valve is tricuspid Mild thickening of the aortic valve Mild calcification of the aortic valve. Aortic valve regurgitation is trivial by color flow Doppler.  5. The pulmonic valve was normal in structure.  6. The aortic root is normal in size and structure.     HISTORY OF PRESENT ILLNESS Jon Nelson is an 73 y.o. male  With PMH diabetes, HTN, depression who presented to [facility] ED with c/o of sudden right arm weakness. Patient transferred to [facility] s/p TPA.   Per patient he was getting in  the car about 11:30am 01/30/2019 when he noticed that his right hand was weak. He was unable to put the keys in the ignition and start the car. So he started the car with hiss left hand drove to his sisters house. Denies any CP, SOB, leg weakness, vision changes or HA. No prior stroke history. Has had TIA in the past as well as GI bleed ( rectal bleeding). Patient has been taking tamiflu once daily for the past 3 days for the flu. Modified Rankin: Rankin Score=0. NIHSS:1.  CT head showed no hemorrhage.  CT angio showed no LVO. BG: 98 BP: 140/71. unremarkable  CMP and CBC.  IV TPA administered at 1357.  He was admitted to the neuro ICU for further evaluation and treatment. Patient became septic 3/5 and is now on IV vancomycin.    HOSPITAL COURSE [name] with history of diabetes, HTN, TIA, GI Bleed, HLD, depression presenting with sudden right arm weakness.  tPA Given at OSH 01/30/2019 @ 1357  Stroke:  Scattered L posteriosuperior frontal and parietal infarcts  s/p tPA - embolic - source unknown (not a carotid source)  Resultant  Right arm weakness and sensory loss  CT head at [facility] - moderate small vessel ischemic disease, nothing acute   MRI head -  Small scattered foci of acute/early subacute infarction in the left posterosuperior frontal and parietal lobes inclusive of precentral gyrus.   MRA head - not performed (See CTA H&N)  CTA H&N - no LVO Jon Nelson). CTA of H/N at [facility] showed No LVO but cervical carotid artery atherosclerosis, prominent soft plaque in the left carotid bulb, atherosclerosis with mild right and moderate left cavernous ICA stenosis and mod-severe right P1 stenosis. Moderate distal left vertebral artery stenosis.   Prominent soft plaque in the left carotid bulb on imaging but negative carotid dopplers. Do not feel carotid intervention necessary  TEE   loop recorder placed   LDL - 71  HgbA1c - 6.6  UDS - not performed  No  antithrombotic prior to admission, now on aspirin 325 mg daily.  Continue Eliquis at discharge  Therapy recommendations:  Home with home health  Disposition: Home with home health  Hypertension  Stable  BP goal normotensive  Hyperlipidemia  Lipid lowering medication PTA:  none  LDL 71, goal < 70  Add statin - lipitor 40  Continue statin at discharge  Diabetes  HgbA1c 6.6, at goal < 7.0  Other Stroke Risk Factors  Advanced age  No stroke hx  Other Active Problems  Recent flu like sxs - on Tamiflu - droplet precautions  IV vancomycin for a total of 14 days.     DISCHARGE EXAM Blood pressure 123/75, pulse 89, temperature 98.3 F (36.8 C), temperature source Oral, resp. rate 17, height _0  (1.753 m), weight 79.9 kg, SpO2 97 %.  pleasant elderly   male not in distress.  Afebrile. Head is nontraumatic. Neck is supple without bruit.    Cardiac exam s1/s2 heard. Lungs are clear to auscultation bilaterally. Distal pulses are well felt. Neurological Exam ;  Awake alert oriented, no dysarthria noted. No aphasia,Extraocular moments are full range without nystagmus. PERRL, EOMI bilaterally.  Face is symmetric without weakness. Tongue midline. Motor system exam reveals no drift but significant weakness of right grip and intrinsic hand muscles.  Symmetric and equal lower extremity strength DTRs are symmetric. Sensation is intact. Plantars are downgoing. Gait not tested.   Discharge Diet   carb modified thin liquids  DISCHARGE PLAN  Disposition: Discharge home  Outpatient PT and OT  Eliquis (apixaban) daily for secondary stroke prevention.  Ongoing risk factor control by Primary Care Physician at time of discharge  Follow-up Patient, No Pcp Per in 2 weeks.  Follow-up in [facility] in 4 weeks, office to schedule an appointment.   40 minutes were spent preparing discharge.  Laurey Morale, MSN, NP-C [facility]  Hospitalist [phone]  ATTENDING NOTE: I reviewed above note and agree with the assessment and plan. Pt was seen and examined.   Patient sitting in chair, no acute event overnight.  Neuro stable, eager to go home.  Surveillance blood culture no growth to date.  ID on board, recommend PICC line insertion with vancomycin home infusion for total 14 days.  Patient will be discharged after PICC line insertion and today's vancomycin dose.  Continue Eliquis for total 3 months for right arm venous thrombosis.  After 3 months, Eliquis can be discontinued and switched to aspirin.  He will follow-up with ID and neurology as outpatient.  Chirstopher Iovino  Erlinda Hong, MD PhD Stroke Neurology 02/09/2019 7:24 PM

## 2019-02-03 ENCOUNTER — Other Ambulatory Visit: Payer: Self-pay

## 2019-02-03 ENCOUNTER — Encounter (HOSPITAL_COMMUNITY)
Admission: AD | Disposition: A | Discharge status: Home Health | Payer: Self-pay | Source: Other Acute Inpatient Hospital | Attending: Neurology

## 2019-02-03 ENCOUNTER — Encounter (HOSPITAL_COMMUNITY): Payer: Self-pay | Admitting: *Deleted

## 2019-02-03 ENCOUNTER — Inpatient Hospital Stay (HOSPITAL_COMMUNITY): Payer: Medicare HMO

## 2019-02-03 DIAGNOSIS — I6389 Other cerebral infarction: Secondary | ICD-10-CM

## 2019-02-03 HISTORY — PX: LOOP RECORDER INSERTION: EP1214

## 2019-02-03 HISTORY — PX: TEE WITHOUT CARDIOVERSION: SHX5443

## 2019-02-03 LAB — GLUCOSE, CAPILLARY
GLUCOSE-CAPILLARY: 94 mg/dL (ref 70–99)
Glucose-Capillary: 101 mg/dL — ABNORMAL HIGH (ref 70–99)
Glucose-Capillary: 180 mg/dL — ABNORMAL HIGH (ref 70–99)

## 2019-02-03 SURGERY — LOOP RECORDER INSERTION

## 2019-02-03 SURGERY — ECHOCARDIOGRAM, TRANSESOPHAGEAL
Anesthesia: Moderate Sedation

## 2019-02-03 MED ORDER — SODIUM CHLORIDE 0.9 % IV SOLN: INTRAVENOUS | Status: DC

## 2019-02-03 MED ORDER — FENTANYL CITRATE (PF) 100 MCG/2ML IJ SOLN
INTRAMUSCULAR | Status: DC | PRN
  Administered 2019-02-03: 25 ug via INTRAVENOUS

## 2019-02-03 MED ORDER — MIDAZOLAM HCL (PF) 10 MG/2ML IJ SOLN
INTRAMUSCULAR | Status: DC | PRN
  Administered 2019-02-03 (×2): 1 mg via INTRAVENOUS

## 2019-02-03 MED ORDER — BUTAMBEN-TETRACAINE-BENZOCAINE 2-2-14 % EX AERO
INHALATION_SPRAY | CUTANEOUS | Status: DC | PRN
  Administered 2019-02-03: 2 via TOPICAL

## 2019-02-03 MED ORDER — LIDOCAINE-EPINEPHRINE 1 %-1:100000 IJ SOLN
INTRAMUSCULAR | Status: DC | PRN
  Administered 2019-02-03: 20 mL via INTRADERMAL

## 2019-02-03 MED ORDER — FENTANYL CITRATE (PF) 100 MCG/2ML IJ SOLN
INTRAMUSCULAR | Status: AC
  Filled 2019-02-03: qty 2

## 2019-02-03 MED ORDER — LIDOCAINE-EPINEPHRINE 1 %-1:100000 IJ SOLN
INTRAMUSCULAR | Status: AC
  Filled 2019-02-03: qty 1

## 2019-02-03 MED ORDER — MIDAZOLAM HCL (PF) 5 MG/ML IJ SOLN
INTRAMUSCULAR | Status: AC
  Filled 2019-02-03: qty 2

## 2019-02-03 SURGICAL SUPPLY — 2 items
LOOP REVEAL LINQSYS (Prosthesis & Implant Heart) ×2 IMPLANT
PACK LOOP INSERTION (CUSTOM PROCEDURE TRAY) ×2 IMPLANT

## 2019-02-03 NOTE — Interval H&P Note (Signed)
History and Physical Interval Note:  02/03/2019 11:44 AM  Jon Nelson  has presented today for surgery, with the diagnosis of STROKE  The various methods of treatment have been discussed with the patient and family. After consideration of risks, benefits and other options for treatment, the patient has consented to  Procedure(s) with comments: TRANSESOPHAGEAL ECHOCARDIOGRAM (TEE) (N/A) - loop as a surgical intervention .  The patient's history has been reviewed, patient examined, no change in status, stable for surgery.  I have reviewed the patient's chart and labs.  Questions were answered to the patient's satisfaction.     [employer]

## 2019-02-03 NOTE — Evaluation (Signed)
Speech Language Pathology Evaluation Patient Details Name: Jon Nelson MRN: [id number] DOB: 11-01-1946 Today's Date: 02/03/2019 Time: 3664-4034 SLP Time Calculation (min) (ACUTE ONLY): 24 min  Problem List:  Patient Active Problem List   Diagnosis Date Noted  . Hyperlipidemia 02/02/2019  . Flu 02/02/2019  . Diabetes ([place])   . HTN (hypertension)   . Cryptogenic stroke ([place]) L brain infarcts s/p tPA 01/30/2019   Past Medical History:  Past Medical History:  Diagnosis Date  . Diabetes ([place])   . GI bleed   . HLD (hyperlipidemia)   . HTN (hypertension)   . TIA (transient ischemic attack)    Past Surgical History:  Past Surgical History:  Procedure Laterality Date  . LOOP RECORDER INSERTION N/A 02/03/2019   Procedure: LOOP RECORDER INSERTION;  Surgeon: Constance Haw, MD;  Location: [place] [facility];  Service: Cardiovascular;  Laterality: N/A;   HPI:  Jon Nelson is an 73 y.o. male  With PMH diabetes, HTN, depression who presented to [facility] ED with c/o of sudden right arm weakness. Patient transferred to [facility] s/p TPA.  MRI showed small scattered foci of acute/early subacute infarction are present within the left posterosuperior frontal and parietal lobes inclusive of precentral gyrus. No hemorrhage or mass effect. Patient passed [place] and on a diet.    Assessment / Plan / Recommendation Clinical Impression  Pt participated in speech/language/cognition re-evaluation secondary to the concerns of occupational therapy regarding the pt's cognition. The pt reported that he has been having difficulty with memory and stated that he believes that it takes him longer to process information. However, he was unable to indicate how long he has been having this difficulty. No family/friends were at bedside during the evaluation to provide information regarding his baseline. However, the pt's neighbors arrived following the evaluation and indicated that the has been demonstrating  increased difficulty with memory, "sundowning", and "has been making some questionable choices recently". It was further reported that a decline in cognition has been observed since Christmas.   The [facility] Cognitive Assessment 8.1 was completed to evaluate the pt's cognitive-linguistic skills. He achieved a score of 14/30 which is below the normal limits of 26 or more out of 30 and is suggestive of a moderate impairment. He demonstrated deficits in the areas of executive function, memory, mental manipulation, attention, divergent naming, and abstract reasoning. Pt was presented with various problem solving scenarios related to safety and based on his responses, it appears that 24-hour supervision will be necessary upon discharge. Skilled SLP services are clinically indicated at this time to improve the pt's cognitive-linguistic function. [name], CSW and [place] with Case Management have both been updated regarding the results of the re-evaluation and recommendations.     SLP Assessment  SLP Recommendation/Assessment: Patient needs continued Speech Lanaguage Pathology Services SLP Visit Diagnosis: Cognitive communication deficit (V42.595)    Follow Up Recommendations  Inpatient Rehab;24 hour supervision/assistance However, considering the potential chronicity of the pt's cognition, a more long-term treatment may be warranted.     Frequency and Duration min 2x/week  2 weeks      SLP Evaluation Cognition  Overall Cognitive Status: No family/caregiver present to determine baseline cognitive functioning Arousal/Alertness: Awake/alert Attention: Sustained;Focused Focused Attention: Impaired(Viglance impaired: 0/1) Sustained Attention: Impaired Sustained Attention Impairment: Verbal complex(Serial 7s: 0/3) Memory: Impaired Memory Impairment: Retrieval deficit(Immediate: 3/5; Delayed: 0/5; With choice cues: 4/5) Awareness: Impaired Awareness Impairment: Intellectual impairment Problem Solving:  Impaired Problem Solving Impairment: Verbal complex Executive Function: Reasoning;Sequencing Reasoning: Impaired Reasoning  Impairment: Verbal complex(Abstraction: 0/2) Sequencing: Impaired Sequencing Impairment: Verbal complex(Clock drawing: 2/3)       Comprehension  Auditory Comprehension Overall Auditory Comprehension: Appears within functional limits for tasks assessed Yes/No Questions: Within Functional Limits Commands: Impaired Complex Commands: (Completion of trail: 0/1) Conversation: Simple Reading Comprehension Reading Status: Not tested    Expression Expression Primary Mode of Expression: Verbal Verbal Expression Initiation: No impairment Level of Generative/Spontaneous Verbalization: Sentence Repetition: Impaired Level of Impairment: Sentence level(1/2) Naming: No impairment(Confrontational: 3/3) Pragmatics: No impairment Interfering Components: Attention Written Expression Dominant Hand: Left Written Expression: (Copying cube: 0/1)   Oral / Motor  Oral Motor/Sensory Function Overall Oral Motor/Sensory Function: Within functional limits Motor Speech Overall Motor Speech: Appears within functional limits for tasks assessed Respiration: Within functional limits Phonation: Normal Resonance: Within functional limits Articulation: Within functional limitis Intelligibility: Intelligible Motor Planning: Witnin functional limits Motor Speech Errors: Not applicable            Jon Nelson I. Hardin Negus, [place], [place] Office number [phone] Pager [place] 02/03/2019, 5:01 PM

## 2019-02-03 NOTE — Progress Notes (Signed)
STROKE TEAM NOTE  SUBJECTIVE Stable neuro status.  .TEE is scheduled for today.patient was still on droplet isolation and states he is not feeling too well. He however had taken Tamiflu 2 days prior to admission and today is day #8/ hence may discontinued droplet isolations. Discussed with Dr.Comer from infectious disease who agrees.  OBJECTIVE Vitals:   02/03/19 1247 02/03/19 1250 02/03/19 1300 02/03/19 1544  BP: (!) 105/45 (!) 105/45 (!) 98/49 132/75  Pulse: 94 94 92   Resp: (!) 26 (!) 26 (!) 22 18  Temp:  97.6 F (36.4 C)    TempSrc:  Oral    SpO2: 95% 94% 94%   Weight:      Height:        CBC: No results for input(s): WBC, NEUTROABS, HGB, HCT, MCV, PLT in the last 168 hours.  Basic Metabolic Panel: No results for input(s): NA, K, CL, CO2, GLUCOSE, BUN, CREATININE, CALCIUM, MG, PHOS in the last 168 hours.  Lipid Panel:     Component Value Date/Time   CHOL 120 01/31/2019 0513   TRIG 137 01/31/2019 0513   HDL 22 (L) 01/31/2019 0513   CHOLHDL 5.5 01/31/2019 0513   VLDL 27 01/31/2019 0513   LDLCALC 71 01/31/2019 0513   HgbA1c:  Lab Results  Component Value Date   HGBA1C 6.6 (H) 01/31/2019   IMAGING CTA of H/N at [facility]  01/30/2019  No LVO but cervical carotid artery atherosclerosis, prominent soft plaque in the left carotid bulb, atherosclerosis with mild right and moderate left cavernous ICA stenosis and mod-severe right P1 stenosis. Moderate distal left vertebral artery stenosis.   CT HEAD WO CONTRAST  01/30/2019 Was negative for acute event at [facility], moderate small-vessel ischemic changes  Mr Brain Wo Contrast 01/31/2019 1. Small scattered foci of acute/early subacute infarction are present within the left posterosuperior frontal and parietal lobes inclusive of precentral gyrus. No hemorrhage or mass effect. 2. Moderate chronic microvascular ischemic changes and volume loss of the brain. 3. Paranasal sinus disease with fluid levels which may represent acute  sinusitis. Partial bilateral mastoid opacification.   Carotid Doppler   02/01/2019 There is 1-39% bilateral ICA stenosis. Vertebral artery flow is antegrade.    Transthoracic Echocardiogram  1. The left ventricle has normal systolic function with an ejection fraction of 60-65%. The cavity size was normal. Left ventricular diastolic Doppler parameters are consistent with impaired relaxation.  2. The right ventricle has normal systolic function. The cavity was normal. There is no increase in right ventricular wall thickness.  3. The mitral valve is normal in structure.  4. The tricuspid valve is normal in structure.  5. The aortic valve is tricuspid.  6. The pulmonic valve was normal in structure.  7. Normal LV systolic function; mild diastolic dysfunction.  8. The interatrial septum appears to be lipomatous.    Physical Exam  pleasant elderly   male not in distress. . Afebrile. Head is nontraumatic. Neck is supple without bruit.    Cardiac exam no murmur or gallop. Lungs are clear to auscultation. Distal pulses are well felt. Neurological Exam ;  Awake alert oriented 3 with normal speech and language function. No aphasia, apraxia dysarthria. Extraocular moments are full range without nystagmus. Blinks to threat bilaterally. Fundi not visualized. Face is symmetric without weakness. Tongue midline. Motor system exam reveals no drift but significant weakness of right grip and intrinsic hand muscles. Orbits left over right upper extremity. Symmetric and equal lower extremity strength DTRs are symmetric. Sensation is  intact. Plantars are downgoing. Gait not tested.  ASSESSMENT/PLAN Mr. Newel Oien is a 73 y.o. male with history of diabetes, HTN, TIA, GI Bleed, HLD, depression presenting with sudden right arm weakness.  tPA Given at OSH 01/30/2019 @ 1357  Stroke:  Scattered L posteriosuperior frontal and parietal infarcts s/p tPA - embolic - source unknown (not a carotid source)  Resultant  Right  arm weakness and sensory loss  CT head at [facility] - moderate small vessel ischemic disease, nothing acute   MRI head -  Small scattered foci of acute/early subacute infarction in the left posterosuperior frontal and parietal lobes inclusive of precentral gyrus.   MRA head - not performed (See CTA H&N)  CTA H&N - no LVO [name]). CTA of H/N at [facility] showed No LVO but cervical carotid artery atherosclerosis, prominent soft plaque in the left carotid bulb, atherosclerosis with mild right and moderate left cavernous ICA stenosis and mod-severe right P1 stenosis. Moderate distal left vertebral artery stenosis.   Prominent soft plaque in the left carotid bulb on imaging but negative carotid dopplers. Do not feel carotid intervention necessary  TEE to look for embolic source. Arranged with [place] for Wed.  If positive for PFO (patent foramen ovale), check bilateral lower extremity venous dopplers to rule out DVT as possible source of stroke. (I have made patient NPO after midnight Wed).  If TEE negative, a [place] electrophysiologist will consult and consider placement of an implantable loop recorder to evaluate for atrial fibrillation as etiology of stroke. This has been explained to patient/family by Dr. Leonie Man and they are agreeable.]  LDL - 71  HgbA1c - 6.6  UDS - not performed  VTE prophylaxis - SCDs  No antithrombotic prior to admission, now on aspirin 325 mg daily. Discharge recommendations pending   Therapy recommendations:  OP PT and OT  Disposition:  Pending  Hypertension  Stable . Permissive hypertension - Keep SBP < 180 mmHg post tPA . Long-term BP goal normotensive  Hyperlipidemia  Lipid lowering medication PTA:  none  LDL 71, goal < 70  Add statin - lipitor 40  Continue statin at discharge  Diabetes  HgbA1c 6.6, at goal < 7.0  Other Stroke Risk Factors  Advanced age  No stroke hx  Other Active  Problems  Recent flu like sxs - on Tamiflu - droplet precautions   Hospital day # 4   . Await TEE.if negative then loop recorder.likely discharge home after the tests Antony Contras, MD [place] Pager: [phone] 02/03/2019 5:10 PM   To contact Stroke Continuity provider, please refer to [email or web address] After hours, contact General Neurology

## 2019-02-03 NOTE — Care Management Note (Addendum)
Case Management Note  Patient Details  Name: Jon Nelson MRN: [id number] Date of Birth: 23-Nov-1946  Subjective/Objective: 73 yo male presented with right arm weakness and numbness. PMH: diabetes, HTN, depression                    Action/Plan: CM met with patient to discuss dispositional needs. Patient states living at home alone and being independent with his ADLs PTA. PCP verified as: Dr. Rochel Brome; pharmacy: [facility]. Patient indicated his sister-in-law can occasionally assist post transition. ST to eval (pending) cognition to determine if patient is able to safety transition home alone. CMS HH Compare list provided with [facility] selected. [place] referral given to [facility], [address] [facility]; AVS updated. Patient may benefit from CIR prior to returning home. Patient indicated his brother-in-law will provide transportation home. CM team will continue to follow.     Expected Discharge Date:                  Expected Discharge Plan:  [place]  In-House Referral:  NA  Discharge planning Services  CM Consult  Post Acute Care Choice:  Home Health Choice offered to:  Patient  DME Arranged:  N/A DME Agency:  NA  HH Arranged:  RN, Disease Management, PT, OT, Nurse's Aide, Speech Therapy, Social Work [employer] Agency:  [place]  Status of Service:  In process, will continue to follow  If discussed at Long Length of Stay Meetings, dates discussed:    Additional Comments:  Midge Minium RN, BSN, NCM-BC, ACM-RN [phone] 02/03/2019, 4:14 PM

## 2019-02-03 NOTE — Consult Note (Addendum)
ELECTROPHYSIOLOGY CONSULT NOTE  Patient ID: Jon Nelson MRN: [id number], DOB/AGE: 01/14/1946   Admit date: 01/30/2019 Date of Consult: 02/03/2019  Primary Physician: Patient, No Pcp Per Primary Cardiologist: none Reason for Consultation: Cryptogenic stroke ; recommendations regarding Implantable Loop Recorder, requested by Dr. Leonie Man  History of Present Illness Jon Nelson was admitted on 01/30/2019 with stroke.  He presented to [facility], was started on TPA and transferred to [facility].  They first developed symptoms while at home.   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  he has undergone workup for stroke including echocardiogram and carotid angio/dopplers.   Vascular services on case 2/2 carotid disease, he notes "I have discussed with Dr. Leonie Man plan will be for medical management.  Does not appear to have significant stenosis by duplex however lesion appears ominous by CT angios.  Should he have another symptom would consider carotid endarterectomy or stenting otherwise we will follow-up in the office in 6 months with repeat carotid duplex."  The patient has been monitored on telemetry which has demonstrated sinus rhythm with no arrhythmias.  Inpatient stroke work-up is to be completed with a TEE.   Echocardiogram this admission demonstrated IMPRESSIONS  1. The left ventricle has normal systolic function with an ejection fraction of 60-65%. The cavity size was normal. Left ventricular diastolic Doppler parameters are consistent with impaired relaxation.  2. The right ventricle has normal systolic function. The cavity was normal. There is no increase in right ventricular wall thickness.  3. The mitral valve is normal in structure.  4. The tricuspid valve  is normal in structure.  5. The aortic valve is tricuspid.  6. The pulmonic valve was normal in structure.  7. Normal LV systolic function; mild diastolic dysfunction.  8. The interatrial septum appears to be lipomatous.   Lab work is reviewed. I do not see a flu test here or in [place] record Afebrile  The patient reports he continues to cough, generally continues to feel terrible, :I just cant get over the flu" feeling sick for weeks  The patient reports being tested + for Influenza A at [facility], it seems perhaps a couple days prior to this admission   Prior to admission, the patient denies chest pain, shortness of breath, dizziness, palpitations, or syncope.  They are recovering from their stroke with plans to home at discharge.   Past Medical History:  Diagnosis Date  . Diabetes ([place])   . GI bleed   . HLD (hyperlipidemia)   . HTN (hypertension)   . TIA (transient ischemic attack)      Surgical History: History reviewed. No pertinent surgical history.   Medications Prior to Admission  Medication Sig Dispense Refill Last Dose  . atorvastatin (LIPITOR) 10 MG tablet Take 10 mg by mouth daily.   01/30/2019 at Unknown time  . metFORMIN (GLUCOPHAGE) 500 MG tablet Take 500 mg by mouth every morning.    01/30/2019 at Unknown time  . oseltamivir (TAMIFLU) 75 MG capsule Take 75 mg by mouth daily.   01/30/2019 at Unknown time    Inpatient Medications:  . aspirin  325 mg Oral Daily  . atorvastatin  40 mg Oral q1800  . feeding supplement (GLUCERNA SHAKE)  237 mL Oral TID BM  . insulin aspart  0-9 Units Subcutaneous TID  WC  . pantoprazole  40 mg Oral QHS    Allergies: No Known Allergies  Social History   Socioeconomic History  . Marital status: Married    Spouse name: Not on file  . Number of children: Not on file  . Years of education: Not on file  . Highest education level: Not on file  Occupational History  . Not on file  Social Needs  . Financial resource strain: Not  on file  . Food insecurity:    Worry: Not on file    Inability: Not on file  . Transportation needs:    Medical: Not on file    Non-medical: Not on file  Tobacco Use  . Smoking status: Unknown If Ever Smoked  Substance and Sexual Activity  . Alcohol use: Not on file  . Drug use: Not on file  . Sexual activity: Not on file  Lifestyle  . Physical activity:    Days per week: Not on file    Minutes per session: Not on file  . Stress: Not on file  Relationships  . Social connections:    Talks on phone: Not on file    Gets together: Not on file    Attends religious service: Not on file    Active member of club or organization: Not on file    Attends meetings of clubs or organizations: Not on file    Relationship status: Not on file  . Intimate partner violence:    Fear of current or ex partner: Not on file    Emotionally abused: Not on file    Physically abused: Not on file    Forced sexual activity: Not on file  Other Topics Concern  . Not on file  Social History Narrative  . Not on file     History reviewed. An uncle died of lung cancer, was a heavy smoker   Review of Systems: All other systems reviewed and are otherwise negative except as noted above.  Physical Exam: Vitals:   02/02/19 1533 02/02/19 2007 02/02/19 2318 02/03/19 0318  BP: 123/75 131/75 (!) 141/85 129/69  Pulse: 89 96 93 85  Resp: 17 (!) 23 (!) 23 18  Temp:  98.5 F (36.9 C) 98.7 F (37.1 C) 98.6 F (37 C)  TempSrc:  Oral Oral Oral  SpO2: 97% 95% 96% 97%  Weight:      Height:        GEN- The patient is well appearing, alert and oriented x 3 today.   Head- normocephalic, atraumatic Eyes-  Sclera clear, conjunctiva pink Ears- hearing intact Oropharynx- clear Neck- supple Lungs- CTA b/l, normal work of breathing Heart- RRR, no murmurs, rubs or gallops  GI- soft, NT, ND Extremities- no clubbing, cyanosis, or edema MS- no significant deformity or atrophy Skin- no rash or lesion Psych-  euthymic mood, full affect   Labs:  No results found for: WBC, HGB, HCT, MCV, PLT No results for input(s): NA, K, CL, CO2, BUN, CREATININE, CALCIUM, PROT, BILITOT, ALKPHOS, ALT, AST, GLUCOSE in the last 168 hours.  Invalid input(s): LABALBU No results found for: CKTOTAL, CKMB, CKMBINDEX, TROPONINI Lab Results  Component Value Date   CHOL 120 01/31/2019   Lab Results  Component Value Date   HDL 22 (L) 01/31/2019   Lab Results  Component Value Date   LDLCALC 71 01/31/2019   Lab Results  Component Value Date   TRIG 137 01/31/2019   Lab Results  Component Value Date   CHOLHDL 5.5 01/31/2019  No results found for: LDLDIRECT  No results found for: DDIMER   Radiology/Studies:   Mr Brain Wo Contrast Result Date: 01/31/2019 CLINICAL DATA:  73 y/o  M; right arm weakness.  Post tPA. EXAM: MRI HEAD WITHOUT CONTRAST TECHNIQUE: Multiplanar, multiecho pulse sequences of the brain and surrounding structures were obtained without intravenous contrast. COMPARISON:  None. FINDINGS: Brain: Small scattered foci of reduced diffusion are present within the left posterosuperior frontal and parietal lobes inclusive of the precentral gyrus compatible with acute/early subacute infarction. Foci of infarction demonstrate T2 FLAIR hyperintense signal. No abnormal susceptibility hypointensity to indicate intracranial hemorrhage. Early confluent nonspecific T2 FLAIR hyperintensities in subcortical and periventricular white matter are compatible with moderate chronic microvascular ischemic changes. Moderate volume loss of the brain. No extra-axial collection, hydrocephalus, mass effect, or herniation. Vascular: Normal flow voids. Skull and upper cervical spine: Normal marrow signal. Sinuses/Orbits: Extensive paranasal sinus disease with fluid levels. Partial opacification of the mastoid air cells. Orbits are unremarkable. Other: None. IMPRESSION: 1. Small scattered foci of acute/early subacute infarction are  present within the left posterosuperior frontal and parietal lobes inclusive of precentral gyrus. No hemorrhage or mass effect. 2. Moderate chronic microvascular ischemic changes and volume loss of the brain. 3. Paranasal sinus disease with fluid levels which may represent acute sinusitis. Partial bilateral mastoid opacification. These results will be called to the ordering clinician or representative by the Radiologist Assistant, and communication documented in the PACS or zVision Dashboard. Electronically Signed   By: Kristine Garbe M.D.   On: 01/31/2019 13:56     Vas US Carotid Result Date: 02/01/2019 Carotid Arterial Duplex Study Indications:       CVA and Weakness. Risk Factors:      Hypertension, hyperlipidemia, Diabetes. Other Factors:     CTA of neck showed prominent plaque at the left carotid bulb                    with no significant stenosis. Comparison Study:  No prior study on file for comparison Performing Technologist: Sharion Dove RVS  Examination Guidelines: A complete evaluation includes B-mode imaging, spectral Doppler, color Doppler, and power Doppler as needed of all accessible portions of each vessel. Bilateral testing is considered an integral part of a complete examination. Limited examinations for reoccurring indications may be performed as noted.   Summary: Right Carotid: Velocities in the right ICA are consistent with a 1-39% stenosis. Left Carotid: Velocities in the left ICA are consistent with a 40-59% stenosis,               low range of scale, however, plaque appears to fill a significant               part of the vessel. Vertebrals:  Bilateral vertebral arteries demonstrate antegrade flow. Subclavians: Normal flow hemodynamics were seen in bilateral subclavian              arteries. *See table(s) above for measurements and observations.  Electronically signed by Antony Contras MD on 02/01/2019 at 12:41:30 PM.    Final     12-lead ECG SR (hard copy from [place], no  EKGs in Epic All prior EKG's in EPIC reviewed with no documented atrial fibrillation  Telemetry SR  Assessment and Plan:  1. Cryptogenic stroke The patient presents with cryptogenic stroke.  The patient has a TEE planned for this AM.  Dr. Curt Bears, spoke at length with the patient about monitoring for afib with either a 30 day event monitor  or an implantable loop recorder.  Risks, benefits, and alteratives to implantable loop recorder were discussed with the patient today.   At this time, the patient is very clear in their decision to proceed with implantable loop recorder. HOWEVER, he remains on droplet precautions, continues to have cough/malaise.  He is afebrile.  Dr. Curt Bears has seen and examined the patient.  Discussed case with Dr. Leonie Man, attending MD.  Will hold off TEE/loop, they will ask ID to see the patient.  Please re-call EP when patient cleared from ID to proceed    Baldwin Jamaica, PA-C 02/03/2019  I have seen and examined this patient with Tommye Standard.  Agree with above, note added to reflect my findings.  On exam, RRR, no murmurs, lungs clear.  Patient presented to the hospital with cryptogenic stroke. To date, no cause has been found. TEE planned for today. If unrevealing, will plan for LINQ monitor to look for atrial fibrillation. Risks and benefits discussed. Risks include but not limited to bleeding and infection. The patient understands the risks and has agreed to the procedure.  Will M. Camnitz MD 02/03/2019 8:27 AM

## 2019-02-03 NOTE — H&P (View-Only) (Signed)
ELECTROPHYSIOLOGY CONSULT NOTE  Patient ID: Jon Nelson MRN: [id number], DOB/AGE: 07/15/1946   Admit date: 01/30/2019 Date of Consult: 02/03/2019  Primary Physician: Patient, No Pcp Per Primary Cardiologist: none Reason for Consultation: Cryptogenic stroke ; recommendations regarding Implantable Loop Recorder, requested by Dr. Leonie Man  History of Present Illness Jon Nelson was admitted on 01/30/2019 with stroke.  He presented to [facility], was started on TPA and transferred to [facility].  They first developed symptoms while at home.   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  he has undergone workup for stroke including echocardiogram and carotid angio/dopplers.   Vascular services on case 2/2 carotid disease, he notes "I have discussed with Dr. Leonie Man plan Isma Tietje be for medical management.  Does not appear to have significant stenosis by duplex however lesion appears ominous by CT angios.  Should he have another symptom would consider carotid endarterectomy or stenting otherwise we Alianah Lofton follow-up in the office in 6 months with repeat carotid duplex."  The patient has been monitored on telemetry which has demonstrated sinus rhythm with no arrhythmias.  Inpatient stroke work-up is to be completed with a TEE.   Echocardiogram this admission demonstrated IMPRESSIONS  1. The left ventricle has normal systolic function with an ejection fraction of 60-65%. The cavity size was normal. Left ventricular diastolic Doppler parameters are consistent with impaired relaxation.  2. The right ventricle has normal systolic function. The cavity was normal. There is no increase in right ventricular wall thickness.  3. The mitral valve is normal in structure.  4. The tricuspid valve  is normal in structure.  5. The aortic valve is tricuspid.  6. The pulmonic valve was normal in structure.  7. Normal LV systolic function; mild diastolic dysfunction.  8. The interatrial septum appears to be lipomatous.   Lab work is reviewed. I do not see a flu test here or in [place] record Afebrile  The patient reports he continues to cough, generally continues to feel terrible, :I just cant get over the flu" feeling sick for weeks  The patient reports being tested + for Influenza A at [facility], it seems perhaps a couple days prior to this admission   Prior to admission, the patient denies chest pain, shortness of breath, dizziness, palpitations, or syncope.  They are recovering from their stroke with plans to home at discharge.   Past Medical History:  Diagnosis Date  . Diabetes ([place])   . GI bleed   . HLD (hyperlipidemia)   . HTN (hypertension)   . TIA (transient ischemic attack)      Surgical History: History reviewed. No pertinent surgical history.   Medications Prior to Admission  Medication Sig Dispense Refill Last Dose  . atorvastatin (LIPITOR) 10 MG tablet Take 10 mg by mouth daily.   01/30/2019 at Unknown time  . metFORMIN (GLUCOPHAGE) 500 MG tablet Take 500 mg by mouth every morning.    01/30/2019 at Unknown time  . oseltamivir (TAMIFLU) 75 MG capsule Take 75 mg by mouth daily.   01/30/2019 at Unknown time    Inpatient Medications:  . aspirin  325 mg Oral Daily  . atorvastatin  40 mg Oral q1800  . feeding supplement (GLUCERNA SHAKE)  237 mL Oral TID BM  . insulin aspart  0-9 Units Subcutaneous TID  WC  . pantoprazole  40 mg Oral QHS    Allergies: No Known Allergies  Social History   Socioeconomic History  . Marital status: Married    Spouse name: Not on file  . Number of children: Not on file  . Years of education: Not on file  . Highest education level: Not on file  Occupational History  . Not on file  Social Needs  . Financial resource strain: Not  on file  . Food insecurity:    Worry: Not on file    Inability: Not on file  . Transportation needs:    Medical: Not on file    Non-medical: Not on file  Tobacco Use  . Smoking status: Unknown If Ever Smoked  Substance and Sexual Activity  . Alcohol use: Not on file  . Drug use: Not on file  . Sexual activity: Not on file  Lifestyle  . Physical activity:    Days per week: Not on file    Minutes per session: Not on file  . Stress: Not on file  Relationships  . Social connections:    Talks on phone: Not on file    Gets together: Not on file    Attends religious service: Not on file    Active member of club or organization: Not on file    Attends meetings of clubs or organizations: Not on file    Relationship status: Not on file  . Intimate partner violence:    Fear of current or ex partner: Not on file    Emotionally abused: Not on file    Physically abused: Not on file    Forced sexual activity: Not on file  Other Topics Concern  . Not on file  Social History Narrative  . Not on file     History reviewed. An uncle died of lung cancer, was a heavy smoker   Review of Systems: All other systems reviewed and are otherwise negative except as noted above.  Physical Exam: Vitals:   02/02/19 1533 02/02/19 2007 02/02/19 2318 02/03/19 0318  BP: 123/75 131/75 (!) 141/85 129/69  Pulse: 89 96 93 85  Resp: 17 (!) 23 (!) 23 18  Temp:  98.5 F (36.9 C) 98.7 F (37.1 C) 98.6 F (37 C)  TempSrc:  Oral Oral Oral  SpO2: 97% 95% 96% 97%  Weight:      Height:        GEN- The patient is well appearing, alert and oriented x 3 today.   Head- normocephalic, atraumatic Eyes-  Sclera clear, conjunctiva pink Ears- hearing intact Oropharynx- clear Neck- supple Lungs- CTA b/l, normal work of breathing Heart- RRR, no murmurs, rubs or gallops  GI- soft, NT, ND Extremities- no clubbing, cyanosis, or edema MS- no significant deformity or atrophy Skin- no rash or lesion Psych-  euthymic mood, full affect   Labs:  No results found for: WBC, HGB, HCT, MCV, PLT No results for input(s): NA, K, CL, CO2, BUN, CREATININE, CALCIUM, PROT, BILITOT, ALKPHOS, ALT, AST, GLUCOSE in the last 168 hours.  Invalid input(s): LABALBU No results found for: CKTOTAL, CKMB, CKMBINDEX, TROPONINI Lab Results  Component Value Date   CHOL 120 01/31/2019   Lab Results  Component Value Date   HDL 22 (L) 01/31/2019   Lab Results  Component Value Date   LDLCALC 71 01/31/2019   Lab Results  Component Value Date   TRIG 137 01/31/2019   Lab Results  Component Value Date   CHOLHDL 5.5 01/31/2019  No results found for: LDLDIRECT  No results found for: DDIMER   Radiology/Studies:   Mr Brain Wo Contrast Result Date: 01/31/2019 CLINICAL DATA:  73 y/o  M; right arm weakness.  Post tPA. EXAM: MRI HEAD WITHOUT CONTRAST TECHNIQUE: Multiplanar, multiecho pulse sequences of the brain and surrounding structures were obtained without intravenous contrast. COMPARISON:  None. FINDINGS: Brain: Small scattered foci of reduced diffusion are present within the left posterosuperior frontal and parietal lobes inclusive of the precentral gyrus compatible with acute/early subacute infarction. Foci of infarction demonstrate T2 FLAIR hyperintense signal. No abnormal susceptibility hypointensity to indicate intracranial hemorrhage. Early confluent nonspecific T2 FLAIR hyperintensities in subcortical and periventricular white matter are compatible with moderate chronic microvascular ischemic changes. Moderate volume loss of the brain. No extra-axial collection, hydrocephalus, mass effect, or herniation. Vascular: Normal flow voids. Skull and upper cervical spine: Normal marrow signal. Sinuses/Orbits: Extensive paranasal sinus disease with fluid levels. Partial opacification of the mastoid air cells. Orbits are unremarkable. Other: None. IMPRESSION: 1. Small scattered foci of acute/early subacute infarction are  present within the left posterosuperior frontal and parietal lobes inclusive of precentral gyrus. No hemorrhage or mass effect. 2. Moderate chronic microvascular ischemic changes and volume loss of the brain. 3. Paranasal sinus disease with fluid levels which may represent acute sinusitis. Partial bilateral mastoid opacification. These results Pavlos Yon be called to the ordering clinician or representative by the Radiologist Assistant, and communication documented in the PACS or zVision Dashboard. Electronically Signed   By: Kristine Garbe M.D.   On: 01/31/2019 13:56     Vas US Carotid Result Date: 02/01/2019 Carotid Arterial Duplex Study Indications:       CVA and Weakness. Risk Factors:      Hypertension, hyperlipidemia, Diabetes. Other Factors:     CTA of neck showed prominent plaque at the left carotid bulb                    with no significant stenosis. Comparison Study:  No prior study on file for comparison Performing Technologist: Sharion Dove RVS  Examination Guidelines: A complete evaluation includes B-mode imaging, spectral Doppler, color Doppler, and power Doppler as needed of all accessible portions of each vessel. Bilateral testing is considered an integral part of a complete examination. Limited examinations for reoccurring indications may be performed as noted.   Summary: Right Carotid: Velocities in the right ICA are consistent with a 1-39% stenosis. Left Carotid: Velocities in the left ICA are consistent with a 40-59% stenosis,               low range of scale, however, plaque appears to fill a significant               part of the vessel. Vertebrals:  Bilateral vertebral arteries demonstrate antegrade flow. Subclavians: Normal flow hemodynamics were seen in bilateral subclavian              arteries. *See table(s) above for measurements and observations.  Electronically signed by Antony Contras MD on 02/01/2019 at 12:41:30 PM.    Final     12-lead ECG SR (hard copy from [place], no  EKGs in Epic All prior EKG's in EPIC reviewed with no documented atrial fibrillation  Telemetry SR  Assessment and Plan:  1. Cryptogenic stroke The patient presents with cryptogenic stroke.  The patient has a TEE planned for this AM.  Dr. Curt Bears, spoke at length with the patient about monitoring for afib with either a 30 day event monitor  or an implantable loop recorder.  Risks, benefits, and alteratives to implantable loop recorder were discussed with the patient today.   At this time, the patient is very clear in their decision to proceed with implantable loop recorder. HOWEVER, he remains on droplet precautions, continues to have cough/malaise.  He is afebrile.  Dr. Curt Bears has seen and examined the patient.  Discussed case with Dr. Leonie Man, attending MD.  Tkeya Stencil hold off TEE/loop, they Tyri Elmore ask ID to see the patient.  Please re-call EP when patient cleared from ID to proceed    Baldwin Jamaica, PA-C 02/03/2019  I have seen and examined this patient with Tommye Standard.  Agree with above, note added to reflect my findings.  On exam, RRR, no murmurs, lungs clear.  Patient presented to the hospital with cryptogenic stroke. To date, no cause has been found. TEE planned for today. If unrevealing, Lylliana Kitamura plan for LINQ monitor to look for atrial fibrillation. Risks and benefits discussed. Risks include but not limited to bleeding and infection. The patient understands the risks and has agreed to the procedure.  Makaylyn Sinyard M. Markavious Micco MD 02/03/2019 8:27 AM

## 2019-02-03 NOTE — Progress Notes (Addendum)
Occupational Therapy Treatment Patient Details Name: Jon Nelson MRN: [id number] DOB: [birth year] Today's Date: 02/03/2019    History of present illness Jon Nelson is an 73 y.o. male  With PMH diabetes, HTN, depression who presented to [facility] ED with c/o of sudden right arm weakness. Patient transferred to [facility] s/p TPA.  MRI showed small scattered foci of acute/early subacute infarction are present within the left posterosuperior frontal and parietal lobes inclusive of precentral gyrus. No hemorrhage or mass effect.   OT comments  Patient supine in bed upon therapist entering room and agreeable to OT.  Noted soiled in bed (urine) and questioned patient, he reports "missing the urinal" then voices "I didn't think I was that wet".  Requires supervision to complete hygiene care/LB bathing at EOB, with cueing to attend to R side of body.  Cognition assessed using the Pill Box test: a measure of executive functioning and estimate of medication management abilities.  A straight pass/fail designation is determined by 3 or more errors within average of 5 minutes.  Patient had 51 errors and demonstrated poor awareness of deficits and problem solving, sequencing, awareness of errors, organization of thoughts throughout task; with task taking ~10 minutes.   Although he is familiar to pill box, and reports using one at home, patient sequentially reading pill bottle then placing pill incorrectly into bedtime slot and repeating this with each bottle for following days. In addition, patient requires assist to open medication bottles. Throughout session patient continues to mask deficits, poor understanding of safety and deficits--continues to reports "I'll just figure it out" when therapist attempting to problem solve techniques and tasks in preparation for home.  Reports his sister in law can assist a little, but very hesitant to ask to assistance. Based on performance today, continue to recommend SLP assessment and  believe he will best benefit from CIR in order to optimize independence and safety with ADLs at discharge.  Will follow.    Follow Up Recommendations   CIR, 24/7 SUPERVISION    Equipment Recommendations  3 in 1 bedside commode    Recommendations for Other Services Speech consult;Other (comment)(chaplin services )    Precautions / Restrictions Precautions Precautions: Fall Restrictions Weight Bearing Restrictions: No       Mobility Bed Mobility Overal bed mobility: Needs Assistance Bed Mobility: Supine to Sit     Supine to sit: Supervision        Transfers Overall transfer level: Needs assistance Equipment used: None Transfers: Sit to/from Stand Sit to Stand: Supervision         General transfer comment: supervision for line mgmt and safety     Balance Overall balance assessment: Needs assistance   Sitting balance-Leahy Scale: Normal     Standing balance support: No upper extremity supported;During functional activity Standing balance-Leahy Scale: Fair Standing balance comment: min guard with LB dressing in standing, encouraged to complete seated                           ADL either performed or assessed with clinical judgement   ADL Overall ADL's : Needs assistance/impaired             Lower Body Bathing: Supervison/ safety;Sit to/from stand Lower Body Bathing Details (indicate cue type and reason): cueing to attend to wash R side      Lower Body Dressing: Min guard;Sit to/from stand Lower Body Dressing Details (indicate cue type and reason): donning soiled underwear in standing with min  guard, reviewed safety and completing seated- pt agreeable but hesitant  Toilet Transfer: Ambulation;Supervision/safety Toilet Transfer Details (indicate cue type and reason): simulated to recliner    Toileting - Clothing Manipulation Details (indicate cue type and reason): reports "missing" urinal, found supine in bed soaked in urine      Functional  mobility during ADLs: Supervision/safety;Cueing for safety General ADL Comments: pt with assist for problem solving self care tasks, poor awareness      Vision   Vision Assessment?: No apparent visual deficits   Perception     Praxis      Cognition Arousal/Alertness: Awake/alert Behavior During Therapy: WFL for tasks assessed/performed Overall Cognitive Status: No family/caregiver present to determine baseline cognitive functioning Area of Impairment: Memory;Attention;Following commands;Problem solving;Awareness;Safety/judgement                   Current Attention Level: Sustained Memory: Decreased short-term memory;Decreased recall of precautions Following Commands: Follows one step commands with increased time;Follows multi-step commands inconsistently Safety/Judgement: Decreased awareness of safety;Decreased awareness of deficits Awareness: Emergent Problem Solving: Requires verbal cues;Slow processing;Difficulty sequencing General Comments: Pill box test completed, failed- see general comments.         Exercises     Shoulder Instructions       General Comments   General Comments: Assessed using the Pill Box Test. Pt failed the assessment, demonstrating poor planning, mental flexibility, suboptimal search strategies, concrete thinking and the inability to multitask. Pt had a total of 51 errors, where more than 3 errors is considered a fail.  Errors: One tablet 3x/day (yellow) - 21 errors (omission/misplacement) One tablet 2x/day with breakfast and dinner (green) - 14 errors (omission/misplacement) One tablet in the morning (Blue)- 7 errors (omission/misplacement) One tablet daily at bedtime (orange) - 6 errors(omission/misplacement) One tablet every other day (red) -3 errors (omission/misplacement)   Number of misplaced movement errors (pills placed in incorrect compartment)- 7 Number of total errors (sum of omissions; misplacements) - 51 Total time to complete  task (allowed 5 min) - 10 min     Pertinent Vitals/ Pain       Pain Assessment: No/denies pain  Home Living                                          Prior Functioning/Environment              Frequency  Min 2X/week        Progress Toward Goals  OT Goals(current goals can now be found in the care plan section)  Progress towards OT goals: Progressing toward goals     Plan Frequency remains appropriate;Discharge plan needs to be updated    Co-evaluation                 AM-PAC OT "6 Clicks" Daily Activity     Outcome Measure   Help from another person eating meals?: None Help from another person taking care of personal grooming?: A Little Help from another person toileting, which includes using toliet, bedpan, or urinal?: A Little Help from another person bathing (including washing, rinsing, drying)?: A Little Help from another person to put on and taking off regular upper body clothing?: A Little Help from another person to put on and taking off regular lower body clothing?: A Little 6 Click Score: 19    End of Session    OT Visit Diagnosis: Other abnormalities  of gait and mobility (R26.89);Hemiplegia and hemiparesis;Muscle weakness (generalized) (M62.81);Other symptoms and signs involving cognitive function Hemiplegia - Right/Left: Right Hemiplegia - dominant/non-dominant: Non-Dominant Hemiplegia - caused by: Cerebral infarction   Activity Tolerance Patient tolerated treatment well   Patient Left in chair;with call bell/phone within reach;Other (comment)(transport arrived)   Nurse Communication Mobility status;Other (comment)(cog)        Time: 1018-1100 OT Time Calculation (min): 42 min  Charges: OT General Charges $OT Visit: 1 Visit OT Treatments $Self Care/Home Management : 8-22 mins $Cognitive Funtion inital: Initial 15 mins $Cognitive Funtion additional: Additional15 mins  Delight Stare, OT Acute Rehabilitation  Services Pager [phone] Office [phone]    Delight Stare 02/03/2019, 11:21 AM

## 2019-02-03 NOTE — Progress Notes (Signed)
    [facility] has been requested to perform a transesophageal echocardiogram on Jon Nelson for stroke.  After careful review of history and examination, the risks and benefits of transesophageal echocardiogram have been explained including risks of esophageal damage, perforation (1:10,000 risk), bleeding, pharyngeal hematoma as well as other potential complications associated with conscious sedation including aspiration, arrhythmia, respiratory failure and death. Alternatives to treatment were discussed, questions were answered. Patient is willing to proceed.   Pt is scheduled for TEE today. Keep NPO.  [place], [place]  02/03/2019 10:17 AM

## 2019-02-03 NOTE — CV Procedure (Signed)
   Transesophageal Echocardiogram  Indications: CVA  Time out performed  During this procedure the patient is administered a total of Versed 1 mg and Fentanyl 25 mcg to achieve and maintain moderate conscious sedation.  The patient's heart rate, blood pressure, and oxygen saturation are monitored continuously during the procedure. The period of conscious sedation is 15 minutes, of which I was present face-to-face 100% of this time.  Findings:  Left Ventricle: EF 65% normal  Mitral Valve: Trace MR  Aortic Valve: Trace AI  Tricuspid Valve: Trace TR  Left Atrium: Normal, no thrombus  Bubble Contrast Study: negative, no shunt  No embolic source identified.   Candee Furbish, MD

## 2019-02-03 NOTE — Progress Notes (Signed)
  Echocardiogram Echocardiogram Transesophageal has been performed.  Johny Chess 02/03/2019, 1:42 PM

## 2019-02-04 ENCOUNTER — Inpatient Hospital Stay (HOSPITAL_COMMUNITY): Payer: Medicare HMO

## 2019-02-04 DIAGNOSIS — L03113 Cellulitis of right upper limb: Secondary | ICD-10-CM

## 2019-02-04 DIAGNOSIS — R6520 Severe sepsis without septic shock: Secondary | ICD-10-CM

## 2019-02-04 DIAGNOSIS — A419 Sepsis, unspecified organism: Secondary | ICD-10-CM

## 2019-02-04 LAB — URINALYSIS, ROUTINE W REFLEX MICROSCOPIC
Bilirubin Urine: NEGATIVE
Glucose, UA: NEGATIVE mg/dL
Hgb urine dipstick: NEGATIVE
Ketones, ur: NEGATIVE mg/dL
Leukocytes,Ua: NEGATIVE
NITRITE: NEGATIVE
Protein, ur: 100 mg/dL — AB
SPECIFIC GRAVITY, URINE: 1.024 (ref 1.005–1.030)
pH: 5 (ref 5.0–8.0)

## 2019-02-04 LAB — COMPREHENSIVE METABOLIC PANEL
ALK PHOS: 278 U/L — AB (ref 38–126)
ALT: 146 U/L — ABNORMAL HIGH (ref 0–44)
AST: 213 U/L — ABNORMAL HIGH (ref 15–41)
Albumin: 2.3 g/dL — ABNORMAL LOW (ref 3.5–5.0)
Anion gap: 4 — ABNORMAL LOW (ref 5–15)
BUN: 16 mg/dL (ref 8–23)
CALCIUM: 7.4 mg/dL — AB (ref 8.9–10.3)
CO2: 19 mmol/L — ABNORMAL LOW (ref 22–32)
Chloride: 106 mmol/L (ref 98–111)
Creatinine, Ser: 0.73 mg/dL (ref 0.61–1.24)
GFR calc Af Amer: >60 mL/min (ref >60)
GFR calc non Af Amer: >60 mL/min (ref >60)
Glucose, Bld: 141 mg/dL — ABNORMAL HIGH (ref 70–99)
Potassium: 4 mmol/L (ref 3.5–5.1)
Sodium: 129 mmol/L — ABNORMAL LOW (ref 135–145)
Total Bilirubin: 1.1 mg/dL (ref 0.3–1.2)
Total Protein: 5.7 g/dL — ABNORMAL LOW (ref 6.5–8.1)

## 2019-02-04 LAB — CBC WITH DIFFERENTIAL/PLATELET
Abs Immature Granulocytes: 0.07 10*3/uL (ref 0.00–0.07)
BASOS ABS: 0 10*3/uL (ref 0.0–0.1)
Basophils Relative: 0 %
Eosinophils Absolute: 0 10*3/uL (ref 0.0–0.5)
Eosinophils Relative: 0 %
HCT: 36.5 % — ABNORMAL LOW (ref 39.0–52.0)
Hemoglobin: 12.7 g/dL — ABNORMAL LOW (ref 13.0–17.0)
Immature Granulocytes: 1 %
LYMPHS ABS: 0.2 10*3/uL — AB (ref 0.7–4.0)
Lymphocytes Relative: 2 %
MCH: 29.5 pg (ref 26.0–34.0)
MCHC: 34.8 g/dL (ref 30.0–36.0)
MCV: 84.9 fL (ref 80.0–100.0)
Monocytes Absolute: 0.1 10*3/uL (ref 0.1–1.0)
Monocytes Relative: 1 %
Neutro Abs: 12.1 10*3/uL — ABNORMAL HIGH (ref 1.7–7.7)
Neutrophils Relative %: 96 %
Platelets: 334 10*3/uL (ref 150–400)
RBC: 4.3 MIL/uL (ref 4.22–5.81)
RDW: 12.6 % (ref 11.5–15.5)
WBC: 12.5 10*3/uL — ABNORMAL HIGH (ref 4.0–10.5)
nRBC: 0 % (ref 0.0–0.2)

## 2019-02-04 LAB — LACTIC ACID, PLASMA
LACTIC ACID, VENOUS: 1.4 mmol/L (ref 0.5–1.9)
Lactic Acid, Venous: 1.3 mmol/L (ref 0.5–1.9)

## 2019-02-04 LAB — GLUCOSE, CAPILLARY
GLUCOSE-CAPILLARY: 129 mg/dL — AB (ref 70–99)
GLUCOSE-CAPILLARY: 110 mg/dL — AB (ref 70–99)
Glucose-Capillary: 189 mg/dL — ABNORMAL HIGH (ref 70–99)

## 2019-02-04 LAB — PROTIME-INR
INR: 1.5 — AB (ref 0.8–1.2)
Prothrombin Time: 18 seconds — ABNORMAL HIGH (ref 11.4–15.2)

## 2019-02-04 LAB — APTT: aPTT: 34 seconds (ref 24–36)

## 2019-02-04 LAB — PROCALCITONIN: Procalcitonin: 1.93 ng/mL

## 2019-02-04 LAB — MRSA PCR SCREENING: MRSA by PCR: NEGATIVE

## 2019-02-04 MED ORDER — SODIUM CHLORIDE 0.9 % IV SOLN
2.0000 g | Freq: Three times a day (TID) | INTRAVENOUS | Status: DC
  Administered 2019-02-05: 2 g via INTRAVENOUS
  Filled 2019-02-04 (×2): qty 2

## 2019-02-04 MED ORDER — SODIUM CHLORIDE 0.9 % IV SOLN
2.0000 g | Freq: Once | INTRAVENOUS | Status: AC
  Administered 2019-02-04: 2 g via INTRAVENOUS
  Filled 2019-02-04: qty 2

## 2019-02-04 MED ORDER — ONDANSETRON HCL 4 MG/2ML IJ SOLN: 4.0000 mg | Freq: Four times a day (QID) | INTRAMUSCULAR | Status: DC | PRN

## 2019-02-04 MED ORDER — SODIUM CHLORIDE 0.9 % IV BOLUS (SEPSIS)
1000.0000 mL | Freq: Once | INTRAVENOUS | Status: AC
  Administered 2019-02-04: 1000 mL via INTRAVENOUS

## 2019-02-04 MED ORDER — SODIUM CHLORIDE 0.9 % IV SOLN
INTRAVENOUS | Status: DC
  Administered 2019-02-05 (×2): via INTRAVENOUS

## 2019-02-04 MED ORDER — SODIUM CHLORIDE 0.9 % IV SOLN: 2.0000 g | Freq: Three times a day (TID) | INTRAVENOUS | Status: DC

## 2019-02-04 MED ORDER — VANCOMYCIN HCL 10 G IV SOLR
1500.0000 mg | Freq: Once | INTRAVENOUS | Status: AC
  Administered 2019-02-04: 1500 mg via INTRAVENOUS
  Filled 2019-02-04: qty 1500

## 2019-02-04 MED ORDER — VANCOMYCIN HCL IN DEXTROSE 1-5 GM/200ML-% IV SOLN: 1000.0000 mg | Freq: Two times a day (BID) | INTRAVENOUS | Status: DC

## 2019-02-04 MED ORDER — ACETAMINOPHEN 650 MG RE SUPP
325.0000 mg | Freq: Once | RECTAL | Status: AC
  Administered 2019-02-04: 325 mg via RECTAL

## 2019-02-04 MED ORDER — VANCOMYCIN HCL 10 G IV SOLR
1250.0000 mg | Freq: Two times a day (BID) | INTRAVENOUS | Status: DC
  Administered 2019-02-05: 1250 mg via INTRAVENOUS
  Filled 2019-02-04: qty 1250

## 2019-02-04 MED ORDER — SODIUM CHLORIDE 0.9 % IV BOLUS (SEPSIS)
500.0000 mL | Freq: Once | INTRAVENOUS | Status: AC
  Administered 2019-02-04: 500 mL via INTRAVENOUS

## 2019-02-04 NOTE — Progress Notes (Signed)
Patient's arm appears red, swollen, and warm to touch in right antecubital where IV was removed previously due to leaking IV site. RN informed MD Leonie Man of situation. Will continue to monitor.

## 2019-02-04 NOTE — Progress Notes (Addendum)
STROKE TEAM NOTE  SUBJECTIVE Stable neuro status. NAD. No longer on droplet for flu.  OBJECTIVE Vitals:   02/03/19 1544 02/03/19 1944 02/03/19 2345 02/04/19 0345  BP: 132/75 117/73 (!) 130/94 118/73  Pulse:   100 98  Resp: 18 (!) 23 (!) 22 19  Temp:   98.1 F (36.7 C) 98.4 F (36.9 C)  TempSrc:   Oral Oral  SpO2:   96% 94%  Weight:      Height:          Lipid Panel:     Component Value Date/Time   CHOL 120 01/31/2019 0513   TRIG 137 01/31/2019 0513   HDL 22 (L) 01/31/2019 0513   CHOLHDL 5.5 01/31/2019 0513   VLDL 27 01/31/2019 0513   LDLCALC 71 01/31/2019 0513   HgbA1c:  Lab Results  Component Value Date   HGBA1C 6.6 (H) 01/31/2019   IMAGING CTA of H/N at [facility]  01/30/2019  No LVO but cervical carotid artery atherosclerosis, prominent soft plaque in the left carotid bulb, atherosclerosis with mild right and moderate left cavernous ICA stenosis and mod-severe right P1 stenosis. Moderate distal left vertebral artery stenosis.   CT HEAD WO CONTRAST  01/30/2019 Was negative for acute event at [facility], moderate small-vessel ischemic changes  Mr Brain Wo Contrast 01/31/2019 1. Small scattered foci of acute/early subacute infarction are present within the left posterosuperior frontal and parietal lobes inclusive of precentral gyrus. No hemorrhage or mass effect. 2. Moderate chronic microvascular ischemic changes and volume loss of the brain. 3. Paranasal sinus disease with fluid levels which may represent acute sinusitis. Partial bilateral mastoid opacification.   Carotid Doppler   02/01/2019 There is 1-39% bilateral ICA stenosis. Vertebral artery flow is antegrade.    Transthoracic Echocardiogram  1. The left ventricle has normal systolic function with an ejection fraction of 60-65%. The cavity size was normal. Left ventricular diastolic Doppler parameters are consistent with impaired relaxation.  2. The right ventricle has normal systolic function. The cavity was  normal. There is no increase in right ventricular wall thickness.  3. The mitral valve is normal in structure.  4. The tricuspid valve is normal in structure.  5. The aortic valve is tricuspid.  6. The pulmonic valve was normal in structure.  7. Normal LV systolic function; mild diastolic dysfunction.  8. The interatrial septum appears to be lipomatous.    Physical Exam    pleasant elderly   male not in distress. . Afebrile. Head is nontraumatic. Neck is supple without bruit.    Cardiac exam no murmur or gallop. Lungs are clear to auscultation. Distal pulses are well felt. Neurological Exam ;  Awake alert oriented 3 with normal speech and language function. No aphasia, apraxia dysarthria. Extraocular moments are full range without nystagmus. Blinks to threat bilaterally. Fundi not visualized. Face is symmetric without weakness. Tongue midline. Motor system exam reveals no drift but significant weakness of right grip and intrinsic hand muscles. Orbits left over right upper extremity. Symmetric and equal lower extremity strength DTRs are symmetric. Sensation is intact. Plantars are downgoing. Gait not tested.  ASSESSMENT/PLAN [name] with history of diabetes, HTN, TIA, GI Bleed, HLD, depression presenting with sudden right arm weakness.  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   MRA head - not performed (See CTA H&N)  CTA H&N - no LVO [name]). CTA of H/N at [facility] showed No LVO but cervical carotid artery atherosclerosis, prominent soft plaque in the left carotid bulb,  atherosclerosis with mild right and moderate left cavernous ICA stenosis and mod-severe right P1 stenosis. Moderate distal left vertebral artery stenosis.   Prominent soft plaque in the left carotid bulb on imaging but negative carotid dopplers. Do not feel carotid intervention necessary  TEE to look for embolic source. Arranged with [place] for Wed.  If positive for PFO (patent foramen ovale), check bilateral lower extremity venous dopplers to rule out DVT as possible source of stroke. (I have made patient NPO after midnight Wed).  If TEE negative, a [place] electrophysiologist will consult and consider placement of an implantable loop recorder to evaluate for atrial fibrillation as etiology of stroke. This has been explained to patient/family by Dr. Leonie Man and they are agreeable.]  LDL - 71  HgbA1c - 6.6  UDS - not performed  VTE prophylaxis - SCDs  No antithrombotic prior to admission, now on aspirin 325 mg daily. Discharge recommendations CIR  Therapy recommendations:  Recommend CIR Disposition:  CLR Hypertension  Stable . Permissive hypertension - Keep SBP < 180 mmHg post tPA . Long-term BP goal normotensive  Hyperlipidemia  Lipid lowering medication PTA:  none  LDL 71, goal < 70  Add statin - lipitor 40  Continue statin at discharge  Diabetes  HgbA1c 6.6, at goal < 7.0  Other Stroke Risk Factors  Advanced age  No stroke hx  Other Active Problems  Recent flu like sxs - on Tamiflu - droplet precautions dc'd 3/4/ 2020   Hospital day # [place], MSN, NP-C [facility] [phone]  Patient appears to be neurologically stable to be discharged to rehabilitation when bed available. Antony Contras, MD.   To contact Stroke Continuity provider, please refer to [email or web address] After hours, contact General Neurology

## 2019-02-04 NOTE — Progress Notes (Signed)
SLP Cancellation Note  Patient Details Name: Jon Nelson MRN: [id number] DOB: 1946-09-05   Cancelled treatment:       Reason Eval/Treat Not Completed: Patient at procedure or test/unavailable. Pt with nursing at this time. SLP will re-attempt as able.   Latonia Conrow I. Hardin Negus, [place], [place] Office number [phone] Pager [place] 02/04/2019, 4:29 PM

## 2019-02-04 NOTE — Significant Event (Signed)
Rapid Response Event Note  Overview: Follow Up - Sepsis -  MEWS  Score 8  Initial Focused Assessment: I went to patient as follow up, upon arrival, I asked the NT to obtain a full set of vital signs including a rectal temperature. Patient was very lethargic, would awake briefly, stated " I just want to sleep, I am so tired". + Profusely diaphoretic, very hot to touch, tachycardiac, and tachypneic. Temperature (R) was 103.1, HR in the mid 120s, RR upper 20s - low 30s, 100% on 2L [place], SBP in the 90s, and MAP > 65. Lung sounds very diminished.   Per family, earlier today, patient was experiencing rigor/chills and had vomited several times. This was change from this morning per family.   Sepsis orders were placed. Last dose of APAP 650 mg PR at 1755. CXR/KUB and [place] of RUE done.   Interventions: -- 1L NS was given, 1.5 L remaining (74mL/kg - 2430). ABX ordered, currently getting Vanco. -- PIV was lost - attempted x 3 by RR RN - veins flat. IV VAST consult placed. Once PIV is obtained, finish fluid resuscitation.  -- APAP 325 PR x 1 now for temperature control -- Apply cooling blanket for temperature control and monitoring temperature.   Plan of Care: -- Monitor VS and follow up with temperature -- Assess for signs of poor perfusion secondary to sepsis (decreased LOC, decreased UOP, delayed capillary refill etc) -- Call RR RN as needed. -- I came back at 2215, 2L were completed, I started the remaining 500 cc bolus. Temperature was now 100. 9 rectally. Skin was normal in temperature, no longer hot nor diaphoretic. SBP > 100, MAP > 70, and HR in the low 100s  Event Summary:   at    [facility] Time 1955 End Time 2035 Follow Up Time 2215  Ab [place] R

## 2019-02-04 NOTE — Progress Notes (Signed)
RN called concerned for possible cellulitis, pt vomiting. Dr. Leonie Man had been paged and was in the process of speaking with TH to come on board to manage possible sepsis. I was unable to go when RN called, explained I would evaluate pt when I was finished with my current pt. Dr. Wyonia Hough at bedside. New orders given and to be completed. Pt transferred to progressive care, BC x2, CXR, CBC, CMP, Lactic acid, pro calcitonin, vancomycin and cefepime ordered. Placed pt on sepsis list. Will continue to monitor.

## 2019-02-04 NOTE — Progress Notes (Signed)
IP rehab admissions - Patient is doing well with PT/OT and functionally does not meet criteria for an acute inpatient rehab stay.  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  He will need SNF or HH/outpatient therapies with supervision from home.  I have talked with brother in law.  I will speak with patient and other family in am.  Call me for questions.  [phone]

## 2019-02-04 NOTE — Progress Notes (Signed)
Physical Therapy Treatment Patient Details Name: Jon Nelson MRN: [id number] DOB: 02-Apr-1946 Today's Date: 02/04/2019    History of Present Illness Jon Nelson is an 73 y.o. male  With PMH diabetes, HTN, depression who presented to [facility] ED with c/o of sudden right arm weakness. Patient transferred to [facility] s/p TPA.  MRI showed small scattered foci of acute/early subacute infarction are present within the left posterosuperior frontal and parietal lobes inclusive of precentral gyrus. No hemorrhage or mass effect.    PT Comments     Pt presented on EOB with brother in law present. Pt agreed to gait training and therex. Pt tolerated tx well and improved his DGI score to 21 (additional goal met). Pt still has slight difficulty decreasing speed quickly and performing a turn, but does not experience LOB. Pt RUE former IV site presented with purulent drainage coming out. Borders well defined, slight pitting in center and about 1 cm in diameter. Nursing assessed and advised elevation and heat packs. Pt left with arm blostered above level of heart and two disposable hot packs left on anterior and posterior RUE around wound site. Pt left in bed with alarm set, phone/call bell, and all other needs within reach. Pt brother in law still present and stated he would be staying with him at discharge. Based on pt current progress HHPT is appropriate at this time. Supervising PT notified of change in recommendations. Plan to progress pt with higher level balance tasks and trial continued gait deficits per the DGI.     Follow Up Recommendations  Home health PT     Equipment Recommendations  None recommended by PT    Recommendations for Other Services       Precautions / Restrictions Precautions Precautions: Fall Restrictions Weight Bearing Restrictions: No    Mobility  Bed Mobility Overal bed mobility: Needs Assistance Bed Mobility: Supine to Sit     Supine to sit: Supervision     General bed  mobility comments: OOB upon entry   Transfers Overall transfer level: Needs assistance Equipment used: None Transfers: Sit to/from Stand Sit to Stand: Supervision            Ambulation/Gait Ambulation/Gait assistance: Min guard Gait Distance (Feet): 250 Feet Assistive device: None Gait Pattern/deviations: Step-through pattern     General Gait Details: generally steady with infrequent episodes of step or drift    Stairs             Wheelchair Mobility    Modified Rankin (Stroke Patients Only)       Balance Overall balance assessment: Needs assistance   Sitting balance-Leahy Scale: Normal     Standing balance support: No upper extremity supported;During functional activity Standing balance-Leahy Scale: Fair                 High Level Balance Comments: Pt able to complete higher level balance tasks with no LOB, minor impairments noted with turns and obstacle negotiation Standardized Balance Assessment Standardized Balance Assessment : Dynamic Gait Index   Dynamic Gait Index Level Surface: Normal Change in Gait Speed: Normal Gait with Horizontal Head Turns: Normal Gait with Vertical Head Turns: Normal Gait and Pivot Turn: Moderate Impairment Step Over Obstacle: Normal Step Around Obstacles: Mild Impairment Steps: Normal Total Score: 21      Cognition Arousal/Alertness: Awake/alert Behavior During Therapy: WFL for tasks assessed/performed Overall Cognitive Status: Within Functional Limits for tasks assessed  General Comments: Did not formally assess but WNL during treatment session      Exercises General Exercises - Lower Extremity Ankle Circles/Pumps: AROM;Both;10 reps;Supine Long Arc Quad: AROM;Seated;Both;10 reps Hip ABduction/ADduction: AROM;Seated;10 reps;Both Mini-Sqauts: 10 reps;AROM;Both;Standing    General Comments General comments (skin integrity, edema, etc.): Removed IV site was  draining clear fluid with well defined borders and slight pitting in middle of wound. Nursing notified and assessed.       Pertinent Vitals/Pain Pain Assessment: No/denies pain    Home Living                      Prior Function            PT Goals (current goals can now be found in the care plan section) Acute Rehab PT Goals Patient Stated Goal: independent at home PT Goal Formulation: With patient Potential to Achieve Goals: Good Additional Goals Additional Goal #1: DGI at seast >=21 to correlated to lower fall risk    Frequency    Min 3X/week      PT Plan Discharge plan needs to be updated    Co-evaluation              AM-PAC PT "6 Clicks" Mobility   Outcome Measure  Help needed turning from your back to your side while in a flat bed without using bedrails?: None Help needed moving from lying on your back to sitting on the side of a flat bed without using bedrails?: None Help needed moving to and from a bed to a chair (including a wheelchair)?: None Help needed standing up from a chair using your arms (e.g., wheelchair or bedside chair)?: None Help needed to walk in hospital room?: A Little Help needed climbing 3-5 steps with a railing? : A Little 6 Click Score: 22    End of Session Equipment Utilized During Treatment: Gait belt Activity Tolerance: Patient tolerated treatment well Patient left: in bed;with bed alarm set;with family/visitor present(RUE elevated and 2 disposable hot packs on anterior to posterior RUE ) Nurse Communication: Mobility status PT Visit Diagnosis: Unsteadiness on feet (R26.81)     Time: 9509-3267 PT Time Calculation (min) (ACUTE ONLY): 25 min  Charges:  $Gait Training: 8-22 mins $Therapeutic Exercise: 8-22 mins                     Maryelizabeth Kaufmann, SPTA   Maryelizabeth Kaufmann 02/04/2019, 3:55 PM

## 2019-02-04 NOTE — Progress Notes (Addendum)
Patients RN was living for the day and was handing over report to charge nurse. During report, charge nurse noticed pt had a heating pad on his right arm. Charge nurse (CN) lifted heating pad and noticed pt's arm was red and the redness was spreading, the arm was warm to touch and the skin was hard and tight. The site of the old IV had infiltrated and the point of the IV insertion had purulent drainage. RN notified physician and rapid response as the pt had also developed a fever of 101.3 Tylenol 650 mg  administered po, increased respiration of 40/minute, Tachycardia and elevated BP. MD arrived and called stroke sepsis. Wound, urine and blood cultures where ordered with bolus. Pt was also given orders to transfer to ICU but the ICU order was discontinued as MD reevaluated pt and decided that pt will be able to receive care on 3W progressive unit. Pt continued to spike elevated temperature after 50 min with projectile vomiting. Tylenol 650 mg suppository was administered.   RN will continue to monitor pt. All code sepsis protocol started and completed within the time frame.

## 2019-02-04 NOTE — Progress Notes (Signed)
Pharmacy Antibiotic Note  Jon Nelson is a 73 y.o. male admitted on 01/30/2019 with sepsis.  Pharmacy has been consulted for vanc/cefepime dosing.  Pt was recently admitted for CVA and got tPA at [facility] before transferring here. TEE was done yesterday with no embolic source found. Pt has fevers and chills this PM and vomited in bed. We will start vanc/cefepime tonight to r/o sepsis. Will get bmet here.   Cultures have been drawn   Plan:  Bmet tonight Vanc 1.5mg  IV x1 Cefepime 2g IV x1 Will f/u for further dosing  Height: 5\' 9"  (175.3 cm) Weight: 176 lb 2.4 oz (79.9 kg) IBW/kg (Calculated) : 70.7  Temp (24hrs), Avg:99 F (37.2 C), Min:98.1 F (36.7 C), Max:101.4 F (38.6 C)  No results for input(s): WBC, CREATININE, LATICACIDVEN, VANCOTROUGH, VANCOPEAK, VANCORANDOM, GENTTROUGH, GENTPEAK, GENTRANDOM, TOBRATROUGH, TOBRAPEAK, TOBRARND, AMIKACINPEAK, AMIKACINTROU, AMIKACIN in the last 168 hours.  CrCl cannot be calculated (No successful lab value found.).    No Known Allergies  Antimicrobials this admission: 3/5 vanc>> 3/5 cefepime>>  Dose adjustments this admission:   Microbiology results: 3/5 blood>> 2/29 MRSA>>neg  Jon Nelson, PharmD, BCIDP, AAHIVP, CPP Infectious Disease Pharmacist 02/04/2019 6:00 PM

## 2019-02-04 NOTE — Consult Note (Signed)
Reason for Consult; fever, tachycardia, cellulitis Referring Physician: Dr. Leonie Man  HPI. Jon Nelson is an 73 y.o. male with a past medical history of diabetes, hypertension, TIA and GI bleed as well as hyperlipidemia and depression who presented to to outside hospital with sudden right arm weakness.  TPA was given at outside hospital patient was subsequently transferred to [facility].  Patient was being treated inpatient for stroke.  Today he developed fever chills and rigors with a notable cellulitis on his right upper extremity.  Rapid response was called and consultation was placed with [facility] hospitalist group for further eval and treatment.  Patient was unable provide much meaningful history given his cognitive impairment and recent stroke.  Staff reports patient's started having fevers of pulse of 101.4, tachycardia, chills,rigors.  Patient's had appropriate sepsis protocol labs ordered by the hospitalist service all of which are pending.  These include a CBC, CMP, lactic acid, pro calcitonin, appropriate fluid resuscitation, and chest x-ray.  Patient was placed on broad-spectrum antibiotics to include vancomycin and cefepime.   Past Medical History:  Diagnosis Date  . Diabetes ([place])   . GI bleed   . HLD (hyperlipidemia)   . HTN (hypertension)   . TIA (transient ischemic attack)     Past Surgical History:  Procedure Laterality Date  . LOOP RECORDER INSERTION N/A 02/03/2019   Procedure: LOOP RECORDER INSERTION;  Surgeon: Constance Haw, MD;  Location: [facility];  Service: Cardiovascular;  Laterality: N/A;    Family history.  Notable for hypertension.  Social History:  reports that he has never smoked. He has never used smokeless tobacco. He reports that he does not drink alcohol or use drugs.  Allergies: No Known Allergies  Medications:  I have reviewed the patient's current medications. Continuous: . sodium chloride    . ceFEPime (MAXIPIME) IV    . sodium chloride      And  . sodium chloride     And  . sodium chloride    . vancomycin      Results for orders placed or performed during the hospital encounter of 01/30/19 (from the past 48 hour(s))  Glucose, capillary     Status: Abnormal   Collection Time: 02/02/19  9:15 PM  Result Value Ref Range   Glucose-Capillary 120 (H) 70 - 99 mg/dL   Comment 1 Notify RN   Glucose, capillary     Status: Abnormal   Collection Time: 02/03/19  7:51 AM  Result Value Ref Range   Glucose-Capillary 101 (H) 70 - 99 mg/dL   Comment 1 Notify RN    Comment 2 Document in Chart   Glucose, capillary     Status: None   Collection Time: 02/03/19  5:29 PM  Result Value Ref Range   Glucose-Capillary 94 70 - 99 mg/dL   Comment 1 Notify RN    Comment 2 Document in Chart   Glucose, capillary     Status: Abnormal   Collection Time: 02/03/19  9:16 PM  Result Value Ref Range   Glucose-Capillary 180 (H) 70 - 99 mg/dL   Comment 1 Notify RN    Comment 2 Document in Chart   Glucose, capillary     Status: Abnormal   Collection Time: 02/04/19  8:02 AM  Result Value Ref Range   Glucose-Capillary 129 (H) 70 - 99 mg/dL   Comment 1 Notify RN    Comment 2 Document in Chart   Glucose, capillary     Status: Abnormal  Collection Time: 02/04/19 12:11 PM  Result Value Ref Range   Glucose-Capillary 189 (H) 70 - 99 mg/dL   Comment 1 Notify RN    Comment 2 Document in Chart   Glucose, capillary     Status: Abnormal   Collection Time: 02/04/19  4:24 PM  Result Value Ref Range   Glucose-Capillary 110 (H) 70 - 99 mg/dL  Urinalysis, Routine w reflex microscopic     Status: Abnormal   Collection Time: 02/04/19  4:35 PM  Result Value Ref Range   Color, Urine AMBER (A) YELLOW    Comment: BIOCHEMICALS MAY BE AFFECTED BY COLOR   APPearance CLEAR CLEAR   Specific Gravity, Urine 1.024 1.005 - 1.030   pH 5.0 5.0 - 8.0   Glucose, UA NEGATIVE NEGATIVE mg/dL   Hgb urine dipstick NEGATIVE NEGATIVE   Bilirubin Urine NEGATIVE NEGATIVE    Ketones, ur NEGATIVE NEGATIVE mg/dL   Protein, ur 100 (A) NEGATIVE mg/dL   Nitrite NEGATIVE NEGATIVE   Leukocytes,Ua NEGATIVE NEGATIVE   RBC / HPF 0-5 0 - 5 RBC/hpf   WBC, UA 0-5 0 - 5 WBC/hpf   Bacteria, UA RARE (A) NONE SEEN   Squamous Epithelial / LPF 0-5 0 - 5   Mucus PRESENT     Comment: Performed at [facility], [place] [address], [place], [place] [address]    No results found.  ROS  Complete 14 point review of symptoms attempted but unable to complete accurately given patient's cognitive impairment.   Physical exam Blood pressure 121/61, pulse 92, temperature (!) 103.1 F (39.5 C), temperature source Axillary, resp. rate (!) 38, height 5\' 9"  (1.753 m), weight 79.9 kg, SpO2 96 %.  General white male, impaired cognition appears ill HEENT.  Normocephalic atraumatic pupils are equal round reactive to light JVD, no lymphadenopathy.  Oropharynx pink mildly dry.  Poor dentition Cardiovascular sinus tachycardia no murmurs noted Pulmonary scattered rhonchi, no rales noted, normal respiratory rate, no accessory muscle use Abdomen was soft, nontender nondistended by clinical exam distant bowel sounds not rigid Extremities right upper extremity with warmth and erythema, possible abscess in the antecubital fossa with open wound mild pus some induration proximal to wound Left upper extremity with some ecchymosis no open wounds Lower extremities within normal limits Psychiatric minimally interactive out of impairment, no obvious decompensation Neurologic again exam limited by patient's acute illness and cognitive impairment, reflexes are in place no focal findings moves all 4 extremities freely.   Assessment/Plan: Severe sepsis.  Unclear etiology differential includes aspiration pneumonia versus cellulitis with abscess versus bacteremia:  Will obtain labs as above to include a CBC with differential, CMP, lactic acid per sepsis protocol, pro calcitonin per protocol, fluid  resuscitation 30 mils per KG and then continue fluids at 100 mils per hour, broad-spectrum antibiotics to include vancomycin and cefepime pharmacy to dose, wound culture, blood cultures, patient currently hemodynamically stable no indication for acute ICU transfer we will continue to monitor on stepdown unit.  Plan of care discussed with family as well as care providers on the unit, and primary attending  Cellulitis possible right upper extremity abscess.  Will obtain right upper extremity ultrasound, broad-spectrum antibiotics, if needed involve general surgery if abscess identified.  Nausea and vomiting likely related to current illness.  I will obtain a KUB to make shows no obstructive process.  Acute stroke.  Management through primary neurology team.  Greater than 70 minutes was spent on coordinating and delivering care on this complex patient.  We will continue to follow along with you.    Christopher Spongberg 02/04/2019, 6:00 PM

## 2019-02-04 NOTE — Progress Notes (Signed)
Was called by patient's nurse stating that patient is having fevers and chills with temperature 101 and is tachycardic. He has an area of erythema in his forearm and arm around a previous IV site with skin induration and redness suggesting cellulitis. Patient also threw up once. There appeared to be febrile and slightly short of breath and restless. His oxygen sats and blood pressure were satisfactory. I recommend the check chest x-ray, CBC, blood cultures and start Zosyn and iv fluids. I ordered a  Code sepsis and will get medical hospitalist team to consult. Patient will likely need transfer to stepdown unit on ICU. D/w Dr Wyonia Hough This patient is critically ill and at significant risk of neurological worsening, death and care requires constant monitoring of vital signs, hemodynamics,respiratory and cardiac monitoring, extensive review of multiple databases, frequent neurological assessment, discussion with family, other specialists and medical decision making of high complexity.I have made any additions or clarifications directly to the above note.This critical care time does not reflect procedure time, or teaching time or supervisory time of PA/NP/Med Resident etc but could involve care discussion time.  I spent 30 minutes of neurocritical care time  in the care of  this patient.  Antony Contras, MD

## 2019-02-05 ENCOUNTER — Encounter (HOSPITAL_COMMUNITY): Payer: Self-pay | Admitting: Cardiology

## 2019-02-05 DIAGNOSIS — R7881 Bacteremia: Secondary | ICD-10-CM

## 2019-02-05 DIAGNOSIS — L0390 Cellulitis, unspecified: Secondary | ICD-10-CM

## 2019-02-05 DIAGNOSIS — I82611 Acute embolism and thrombosis of superficial veins of right upper extremity: Secondary | ICD-10-CM

## 2019-02-05 DIAGNOSIS — I10 Essential (primary) hypertension: Secondary | ICD-10-CM

## 2019-02-05 DIAGNOSIS — B9561 Methicillin susceptible Staphylococcus aureus infection as the cause of diseases classified elsewhere: Secondary | ICD-10-CM

## 2019-02-05 DIAGNOSIS — Z8709 Personal history of other diseases of the respiratory system: Secondary | ICD-10-CM

## 2019-02-05 DIAGNOSIS — E119 Type 2 diabetes mellitus without complications: Secondary | ICD-10-CM

## 2019-02-05 DIAGNOSIS — Z8673 Personal history of transient ischemic attack (TIA), and cerebral infarction without residual deficits: Secondary | ICD-10-CM

## 2019-02-05 LAB — BASIC METABOLIC PANEL
ANION GAP: 9 (ref 5–15)
Anion gap: 7 (ref 5–15)
BUN: 17 mg/dL (ref 8–23)
BUN: 18 mg/dL (ref 8–23)
CALCIUM: 7.5 mg/dL — AB (ref 8.9–10.3)
CHLORIDE: 110 mmol/L (ref 98–111)
CO2: 15 mmol/L — AB (ref 22–32)
CO2: 19 mmol/L — ABNORMAL LOW (ref 22–32)
CREATININE: 0.86 mg/dL (ref 0.61–1.24)
Calcium: 7.6 mg/dL — ABNORMAL LOW (ref 8.9–10.3)
Chloride: 107 mmol/L (ref 98–111)
Creatinine, Ser: 0.56 mg/dL — ABNORMAL LOW (ref 0.61–1.24)
GFR calc Af Amer: >60 mL/min (ref >60)
GFR calc non Af Amer: >60 mL/min (ref >60)
GFR calc non Af Amer: >60 mL/min (ref >60)
Glucose, Bld: 161 mg/dL — ABNORMAL HIGH (ref 70–99)
Glucose, Bld: 161 mg/dL — ABNORMAL HIGH (ref 70–99)
Potassium: 4.3 mmol/L (ref 3.5–5.1)
Potassium: 4.1 mmol/L (ref 3.5–5.1)
Sodium: 131 mmol/L — ABNORMAL LOW (ref 135–145)
Sodium: 136 mmol/L (ref 135–145)

## 2019-02-05 LAB — GLUCOSE, CAPILLARY
Glucose-Capillary: 126 mg/dL — ABNORMAL HIGH (ref 70–99)
Glucose-Capillary: 112 mg/dL — ABNORMAL HIGH (ref 70–99)
Glucose-Capillary: 169 mg/dL — ABNORMAL HIGH (ref 70–99)

## 2019-02-05 LAB — CBC WITH DIFFERENTIAL/PLATELET
Abs Immature Granulocytes: 0.15 10*3/uL — ABNORMAL HIGH (ref 0.00–0.07)
Basophils Absolute: 0 10*3/uL (ref 0.0–0.1)
Basophils Relative: 0 %
Eosinophils Absolute: 0.1 10*3/uL (ref 0.0–0.5)
Eosinophils Relative: 0 %
HCT: 38.9 % — ABNORMAL LOW (ref 39.0–52.0)
Hemoglobin: 13.2 g/dL (ref 13.0–17.0)
Immature Granulocytes: 1 %
Lymphocytes Relative: 2 %
Lymphs Abs: 0.4 10*3/uL — ABNORMAL LOW (ref 0.7–4.0)
MCH: 29 pg (ref 26.0–34.0)
MCHC: 33.9 g/dL (ref 30.0–36.0)
MCV: 85.5 fL (ref 80.0–100.0)
MONOS PCT: 2 %
Monocytes Absolute: 0.4 10*3/uL (ref 0.1–1.0)
Neutro Abs: 18.4 10*3/uL — ABNORMAL HIGH (ref 1.7–7.7)
Neutrophils Relative %: 95 %
Platelets: 391 10*3/uL (ref 150–400)
RBC: 4.55 MIL/uL (ref 4.22–5.81)
RDW: 12.7 % (ref 11.5–15.5)
WBC: 19.4 10*3/uL — ABNORMAL HIGH (ref 4.0–10.5)
nRBC: 0 % (ref 0.0–0.2)

## 2019-02-05 LAB — BLOOD CULTURE ID PANEL (REFLEXED)

## 2019-02-05 LAB — PROCALCITONIN: Procalcitonin: 18.47 ng/mL

## 2019-02-05 MED ORDER — ATORVASTATIN CALCIUM 40 MG PO TABS: 40.0000 mg | ORAL_TABLET | Freq: Every day | ORAL | Status: DC

## 2019-02-05 MED ORDER — APIXABAN 5 MG PO TABS
5.0000 mg | ORAL_TABLET | Freq: Two times a day (BID) | ORAL | Status: DC
  Administered 2019-02-05 – 2019-02-09 (×8): 5 mg via ORAL
  Filled 2019-02-05 (×9): qty 1

## 2019-02-05 MED ORDER — SODIUM CHLORIDE 0.9 % IV SOLN
INTRAVENOUS | Status: DC
  Administered 2019-02-05: 18:00:00 via INTRAVENOUS

## 2019-02-05 MED ORDER — SODIUM CHLORIDE 0.9 % IV SOLN
2.0000 g | INTRAVENOUS | Status: DC
  Administered 2019-02-05 – 2019-02-06 (×8): 2 g via INTRAVENOUS
  Filled 2019-02-05 (×10): qty 2000

## 2019-02-05 NOTE — Progress Notes (Signed)
  Speech Language Pathology Treatment: Cognitive-Linquistic  Patient Details Name: Jon Nelson MRN: [id number] DOB: 1946/03/03 Today's Date: 02/05/2019 Time: 4098-1191 SLP Time Calculation (min) (ACUTE ONLY): 14 min  Assessment / Plan / Recommendation Clinical Impression  Pt was seen for cognitive-linguistic treatment. Pt was cooperative throughout the session but exhibited difficulty maintaining alertness throughout the session and the session was ultimately terminated prematurely for this reason. He demonstrated 25% accuracy with problem solving increasing to 75% accuracy with moderate cues. With simple reasoning tasks, he achieved 75% accuracy. Memory tasks were attempted but pt was unable to maintain alertness for long enough periods to participate. SLP will continue to follow pt.    HPI HPI: Jon Nelson is an 73 y.o. male  With PMH diabetes, HTN, depression who presented to [facility] ED with c/o of sudden right arm weakness. Patient transferred to [facility] s/p TPA.  MRI showed small scattered foci of acute/early subacute infarction are present within the left posterosuperior frontal and parietal lobes inclusive of precentral gyrus. No hemorrhage or mass effect. Patient passed Philip and on a diet. Pt was diagnosed with "Severe sepsis" on 02/04/19      SLP Plan  Continue with current plan of care       Recommendations                   Follow up Recommendations: Home health SLP;24 hour supervision/assistance SLP Visit Diagnosis: Cognitive communication deficit (R41.841) Plan: Continue with current plan of care       Rhyan Wolters I. Hardin Negus, [place], [place] Office number [phone] Pager [place] 02/05/2019, 11:35 AM

## 2019-02-05 NOTE — Progress Notes (Signed)
STROKE TEAM NOTE  SUBJECTIVE Patient fever has come down but he still remains tachycardic. He states is feeling slightly better. Blood cultures are positive for gram-positive cocci staph aureus. Patient's antibiotic has been changed to nafcillin as per ID recommendation.  OBJECTIVE Vitals:   02/05/19 0540 02/05/19 0541 02/05/19 0814 02/05/19 1114  BP:   120/73 118/80  Pulse: 100 100 94 (!) 102  Resp: (!) 21 20 (!) 21 (!) 33  Temp:   99.7 F (37.6 C) 99.8 F (37.7 C)  TempSrc:   Rectal Rectal  SpO2:   98% 96%  Weight:      Height:          Lipid Panel:     Component Value Date/Time   CHOL 120 01/31/2019 0513   TRIG 137 01/31/2019 0513   HDL 22 (L) 01/31/2019 0513   CHOLHDL 5.5 01/31/2019 0513   VLDL 27 01/31/2019 0513   LDLCALC 71 01/31/2019 0513   HgbA1c:  Lab Results  Component Value Date   HGBA1C 6.6 (H) 01/31/2019   IMAGING CTA of H/N at [facility]  01/30/2019  No LVO but cervical carotid artery atherosclerosis, prominent soft plaque in the left carotid bulb, atherosclerosis with mild right and moderate left cavernous ICA stenosis and mod-severe right P1 stenosis. Moderate distal left vertebral artery stenosis.   CT HEAD WO CONTRAST  01/30/2019 Was negative for acute event at [facility], moderate small-vessel ischemic changes  Mr Brain Wo Contrast 01/31/2019 1. Small scattered foci of acute/early subacute infarction are present within the left posterosuperior frontal and parietal lobes inclusive of precentral gyrus. No hemorrhage or mass effect. 2. Moderate chronic microvascular ischemic changes and volume loss of the brain. 3. Paranasal sinus disease with fluid levels which may represent acute sinusitis. Partial bilateral mastoid opacification.   Carotid Doppler   02/01/2019 There is 1-39% bilateral ICA stenosis. Vertebral artery flow is antegrade.    Transthoracic Echocardiogram  1. The left ventricle has normal systolic function with an ejection fraction of 60-65%.  The cavity size was normal. Left ventricular diastolic Doppler parameters are consistent with impaired relaxation.  2. The right ventricle has normal systolic function. The cavity was normal. There is no increase in right ventricular wall thickness.  3. The mitral valve is normal in structure.  4. The tricuspid valve is normal in structure.  5. The aortic valve is tricuspid.  6. The pulmonic valve was normal in structure.  7. Normal LV systolic function; mild diastolic dysfunction.  8. The interatrial septum appears to be lipomatous.  Blood Cultures 02/04/19 ; gram-positive cocci in clusters in both aerobic and anaerobic bottles staph aureus  Physical Exam    pleasant elderly   male not in distress. . Afebrile. Head is nontraumatic. Neck is supple without bruit.    Cardiac exam no murmur or gallop. Lungs are clear to auscultation. Distal pulses are well felt. Neurological Exam ;  Awake alert oriented 3 with normal speech and language function. No aphasia, apraxia dysarthria. Extraocular moments are full range without nystagmus. Blinks to threat bilaterally. Fundi not visualized. Face is symmetric without weakness. Tongue midline. Motor system exam reveals no drift but significant weakness of right grip and intrinsic hand muscles. Orbits left over right upper extremity. Symmetric and equal lower extremity strength DTRs are symmetric. Sensation is intact. Plantars are downgoing. Gait not tested.  ASSESSMENT/PLAN Mr. Nelson Nelson is a 73 y.o. male with history of diabetes, HTN, TIA, GI Bleed, HLD, depression presenting with sudden right arm weakness.  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   MRA head - not performed (See CTA H&N)  CTA H&N - no LVO Nelson Nelson). CTA of H/N at [facility] showed No LVO but cervical carotid artery atherosclerosis, prominent soft plaque in the left carotid bulb, atherosclerosis with mild right and moderate left cavernous ICA stenosis and mod-severe right P1 stenosis. Moderate distal left vertebral artery stenosis.   Prominent soft plaque in the left carotid bulb on imaging but negative carotid dopplers. Do not feel carotid intervention necessary  TEE to look for embolic source. Arranged with [place] for Wed.  If positive for PFO (patent foramen ovale), check bilateral lower extremity venous dopplers to rule out DVT as possible source of stroke. (I have made patient NPO after midnight Wed).  If TEE negative, a [place] electrophysiologist will consult and consider placement of an implantable loop recorder to evaluate for atrial fibrillation as etiology of stroke. This has been explained to patient/family by Dr. Leonie Man and they are agreeable.]  LDL - 71  HgbA1c - 6.6  UDS - not performed  VTE prophylaxis - SCDs  No antithrombotic prior to admission, now on aspirin 325 mg daily. Discharge recommendations CIR  Therapy recommendations:  Home versus snf Disposition:  Pending Hypertension  Stable . Permissive hypertension - Keep SBP < 180 mmHg post tPA . Long-term BP goal normotensive  Hyperlipidemia  Lipid lowering medication PTA:  none  LDL 71, goal < 70  Add statin - lipitor 40  Continue statin at discharge  Diabetes  HgbA1c 6.6, at goal < 7.0  Other Stroke Risk Factors  Advanced age  No stroke hx  Other Active Problems  Recent flu like sxs - on Tamiflu - droplet precautions dc'd 3/4/ 2020  Sepsis secondary to staph aureus likely from cellulitis from right arm forearm   Hospital day # 6      Patient appears to be neurologically stable but has developed sepsis from staph aureus secondary to cellulitis and is currently on antibiotics. He will need to stay in the hospital. He is afebrile for 24-48 hours before he can be discharged on home antibiotics. Discussed with patient and Dr. Algis Liming. Greater than 50% time during this 25 minute visit was spent on counseling and coordination of care about his stroke, cellulitis and need for antibiotics and further hospital stay Antony Contras, MD.   To contact Stroke Continuity provider, please refer to [email or web address] After hours, contact General Neurology

## 2019-02-05 NOTE — Progress Notes (Signed)
Physical Therapy Treatment Patient Details Name: Jon Nelson MRN: [id number] DOB: 1946-05-30 Today's Date: 02/05/2019    History of Present Illness Jon Nelson is an 73 y.o. male  With PMH diabetes, HTN, depression who presented to [facility] ED with c/o of sudden right arm weakness. Patient transferred to [facility] s/p TPA.  MRI showed small scattered foci of acute/early subacute infarction are present within the left posterosuperior frontal and parietal lobes inclusive of precentral gyrus. No hemorrhage or mass effect.    PT Comments    Feeling poorly and feverish.  Agreeable to try mobility.  More unsteady with less stamina.    Follow Up Recommendations  Home health PT     Equipment Recommendations  None recommended by PT    Recommendations for Other Services       Precautions / Restrictions Precautions Precautions: Fall    Mobility  Bed Mobility Overal bed mobility: Needs Assistance Bed Mobility: Supine to Sit     Supine to sit: Min assist     General bed mobility comments: moving stiffly, needing minor assist  Transfers Overall transfer level: Needs assistance Equipment used: None Transfers: Sit to/from Stand Sit to Stand: Min assist(from bed and the toilet)            Ambulation/Gait Ambulation/Gait assistance: Min assist Gait Distance (Feet): 60 Feet Assistive device: IV Pole Gait Pattern/deviations: Step-through pattern     General Gait Details: mildly unsteady and stiff, needing the iv pole for balance.  Expected likely due to fever and generally not feeling well.   Stairs             Wheelchair Mobility    Modified Rankin (Stroke Patients Only) Modified Rankin (Stroke Patients Only) Modified Rankin: Moderate disability     Balance Overall balance assessment: Needs assistance   Sitting balance-Leahy Scale: Good       Standing balance-Leahy Scale: Fair(to poor today due to feeling poor and in bed all day)                               Cognition Arousal/Alertness: Awake/alert Behavior During Therapy: WFL for tasks assessed/performed Overall Cognitive Status: Within Functional Limits for tasks assessed                                 General Comments: slow processing still      Exercises      General Comments        Pertinent Vitals/Pain Pain Assessment: No/denies pain    Home Living                      Prior Function            PT Goals (current goals can now be found in the care plan section) Acute Rehab PT Goals PT Goal Formulation: With patient Time For Goal Achievement: 02/08/19 Potential to Achieve Goals: Good Progress towards PT goals: Progressing toward goals(though not at his best today)    Frequency    Min 3X/week      PT Plan Discharge plan needs to be updated    Co-evaluation              AM-PAC PT "6 Clicks" Mobility   Outcome Measure  Help needed turning from your back to your side while in a flat bed without using bedrails?: None Help needed moving  from lying on your back to sitting on the side of a flat bed without using bedrails?: None Help needed moving to and from a bed to a chair (including a wheelchair)?: A Little Help needed standing up from a chair using your arms (e.g., wheelchair or bedside chair)?: A Little Help needed to walk in hospital room?: A Little Help needed climbing 3-5 steps with a railing? : A Little 6 Click Score: 20    End of Session   Activity Tolerance: Patient tolerated treatment well Patient left: in bed;with bed alarm set;with family/visitor present Nurse Communication: Mobility status PT Visit Diagnosis: Unsteadiness on feet (R26.81)     Time: 7616-0737 PT Time Calculation (min) (ACUTE ONLY): 23 min  Charges:  $Gait Training: 8-22 mins $Therapeutic Activity: 8-22 mins                     02/05/2019  Donnella Sham, PT Acute [place] [phone]  (pager) [phone]   (office)   Jon Nelson 02/05/2019, 5:11 PM

## 2019-02-05 NOTE — Consult Note (Signed)
[facility]    Date of Admission:  01/30/2019     Total days of antibiotics 2               Reason for Consult: MSSA Bacteremia  Referring Provider: CHAMP / [employer] Primary Care Provider: Rochel Brome, MD   Assessment/Plan:  [name] is a 73 y/o male admitted with acute cryptogenic stroke s/p tPA administration at OSH and negative TEE who developed a fever and found to have MSSA bacteremia and acute thrombus in the right basilic vein from previous IV.  Specimen from the right arm with gram stain showing gram positive cocci likely to be MSSA. He had been being treated for flu prior to admission. Source of stroke remains unclear although could be previous endocarditis. Cannot rule out bacteremia as a result of IV site infection given he did not have fevers prior to arrival. Agree with the change of antibiotics to nafcillin. Will recheck blood cultures in the morning. Likely will require at least 6 weeks of IV therapy for MSSA infection.  1. Continue current dose of Nafcillin. 2. Repeat blood cultures in the morning. 3. Monitor fever and wound culture results.    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 aspirin  325 mg Oral Daily  . atorvastatin  40 mg Oral q1800  . feeding supplement (GLUCERNA SHAKE)  237 mL Oral TID BM  . insulin aspart  0-9 Units Subcutaneous TID WC  . pantoprazole  40 mg Oral QHS     HPI: Jon Nelson is a 73 y.o. male with previous medical history of hypertension, diabetes and TIA who presented to [facility] with right hand weakness and was given tPA with concern for acute ischemic stroke. He was on treatment at the time with Tamiflu for recent diagnosis of influenza. He was transferred to [facility] for further care.   MRI on 3/1 with small scattered foci of acute/early subacute infarction in the left  frontal and parietal lobe. TEE completed on 02/03/19 with no evidence of endocarditis. He continued to progress and had right side weakness and on 02/04/19 developed a fever of 103.1 and leukocytosis of 19.4. Nursing also noted that his right arm was swollen and warm to the touch where an IV was previously removed due to leaking. Code sepsis was activated and he was started on vancomycin and cefepime following blood cultures.  Ultrasound of the right arm with acute thrombus in the basilic vein with edema and inflammation.  Mr. Mathison has improved temperature now hovering around 99. Blood cultures positive for MSSA infection. Specimen obtained from the wound of the right arm with gram stain showing gram positive cocci and cultures pending. Pharmacy has narrowed his antibiotic regimen to nafcillin.    Review of Systems: Review of Systems  Constitutional: Positive for chills and malaise/fatigue. Negative for fever and weight loss.  Respiratory: Positive for cough. Negative for shortness of breath and wheezing.   Cardiovascular: Negative for chest pain and leg swelling.  Gastrointestinal: Negative for abdominal pain, constipation, diarrhea, nausea and vomiting.  Genitourinary: Negative for dysuria.  Skin: Negative for rash.     Past Medical History:  Diagnosis Date  . Diabetes ([place])   . GI bleed   . HLD (hyperlipidemia)   . HTN (hypertension)   . TIA (transient ischemic attack)  Social History   Tobacco Use  . Smoking status: Never Smoker  . Smokeless tobacco: Never Used  Substance Use Topics  . Alcohol use: Never    Frequency: Never  . Drug use: Never    History reviewed. No pertinent family history.  No Known Allergies  OBJECTIVE: Blood pressure 120/73, pulse 94, temperature 99.7 F (37.6 C), temperature source Rectal, resp. rate (!) 21, height 5\' 9"  (1.753 m), weight 79.9 kg, SpO2 98 %.  Physical Exam Constitutional:      General: He is not in acute distress.     Appearance: He is well-developed. He is ill-appearing.     Comments: Lying in bed with head of bed elevated; lethargic; arousable by voice.   Cardiovascular:     Rate and Rhythm: Normal rate and regular rhythm.     Heart sounds: Normal heart sounds.  Pulmonary:     Effort: Pulmonary effort is normal.     Breath sounds: Normal breath sounds.  Skin:    General: Skin is warm and dry.  Neurological:     Mental Status: He is lethargic.     Lab Results Lab Results  Component Value Date   WBC 19.4 (H) 02/05/2019   HGB 13.2 02/05/2019   HCT 38.9 (L) 02/05/2019   MCV 85.5 02/05/2019   PLT 391 02/05/2019    Lab Results  Component Value Date   CREATININE 0.56 (L) 02/05/2019   BUN 17 02/05/2019   NA 131 (L) 02/05/2019   K 4.3 02/05/2019   CL 107 02/05/2019   CO2 15 (L) 02/05/2019    Lab Results  Component Value Date   ALT 146 (H) 02/04/2019   AST 213 (H) 02/04/2019   ALKPHOS 278 (H) 02/04/2019   BILITOT 1.1 02/04/2019     Microbiology: Recent Results (from the past 240 hour(s))  MRSA PCR Screening     Status: None   Collection Time: 01/30/19  6:00 PM  Result Value Ref Range Status   MRSA by PCR NEGATIVE NEGATIVE Final    Comment:        The GeneXpert MRSA Assay (FDA approved for NASAL specimens only), is one component of a comprehensive MRSA colonization surveillance program. It is not intended to diagnose MRSA infection nor to guide or monitor treatment for MRSA infections. Performed at [facility], [place] [address], [place] [employer] [place], [place] [address]   Culture, blood (single)     Status: None (Preliminary result)   Collection Time: 02/04/19  5:30 PM  Result Value Ref Range Status   Specimen Description BLOOD RIGHT ARM  Final   Special Requests   Final    BOTTLES DRAWN AEROBIC AND ANAEROBIC Blood Culture adequate volume   Culture  Setup Time   Final    GRAM POSITIVE COCCI IN BOTH AEROBIC AND ANAEROBIC BOTTLES Organism ID to follow CRITICAL RESULT CALLED TO, READ  BACK BY AND VERIFIED WITH: Ellin Mayhew [facility] [id number] MLM Performed at [facility], [place] [address] [place],  [address]    Culture GRAM POSITIVE COCCI  Final   Report Status PENDING  Incomplete  Blood Culture ID Panel (Reflexed)     Status: Abnormal   Collection Time: 02/04/19  5:30 PM  Result Value Ref Range Status   Enterococcus species NOT DETECTED NOT DETECTED Final   Listeria monocytogenes NOT DETECTED NOT DETECTED Final   Staphylococcus species DETECTED (A) NOT DETECTED Final    Comment: CRITICAL RESULT CALLED TO, READ BACK BY AND VERIFIED  WITH: Ellin Mayhew [facility] [phone] MLM    Staphylococcus aureus (BCID) DETECTED (A) NOT DETECTED Final    Comment: Methicillin (oxacillin) susceptible Staphylococcus aureus (MSSA). Preferred therapy is anti staphylococcal beta lactam antibiotic (Cefazolin or Nafcillin), unless clinically contraindicated. CRITICAL RESULT CALLED TO, READ BACK BY AND VERIFIED WITH: PHARMD M PHAM [id number] MLM    Methicillin resistance NOT DETECTED NOT DETECTED Final   Streptococcus species NOT DETECTED NOT DETECTED Final   Streptococcus agalactiae NOT DETECTED NOT DETECTED Final   Streptococcus pneumoniae NOT DETECTED NOT DETECTED Final   Streptococcus pyogenes NOT DETECTED NOT DETECTED Final   Acinetobacter baumannii NOT DETECTED NOT DETECTED Final   Enterobacteriaceae species NOT DETECTED NOT DETECTED Final   Enterobacter cloacae complex NOT DETECTED NOT DETECTED Final   Escherichia coli NOT DETECTED NOT DETECTED Final   Klebsiella oxytoca NOT DETECTED NOT DETECTED Final   Klebsiella pneumoniae NOT DETECTED NOT DETECTED Final   Proteus species NOT DETECTED NOT DETECTED Final   Serratia marcescens NOT DETECTED NOT DETECTED Final   Haemophilus influenzae NOT DETECTED NOT DETECTED Final   Neisseria meningitidis NOT DETECTED NOT DETECTED Final   Pseudomonas aeruginosa NOT DETECTED NOT DETECTED Final   Candida albicans NOT DETECTED NOT DETECTED Final     Candida glabrata NOT DETECTED NOT DETECTED Final   Candida krusei NOT DETECTED NOT DETECTED Final   Candida parapsilosis NOT DETECTED NOT DETECTED Final   Candida tropicalis NOT DETECTED NOT DETECTED Final    Comment: Performed at [facility], [address] [place], [place] [address]  Culture, blood (Routine X 2) w Reflex to ID Panel     Status: None (Preliminary result)   Collection Time: 02/04/19  6:40 PM  Result Value Ref Range Status   Specimen Description BLOOD RIGHT HAND  Final   Special Requests   Final    BOTTLES DRAWN AEROBIC AND ANAEROBIC Blood Culture adequate volume   Culture  Setup Time   Final    GRAM POSITIVE COCCI IN CLUSTERS IN BOTH AEROBIC AND ANAEROBIC BOTTLES CRITICAL VALUE NOTED.  VALUE IS CONSISTENT WITH PREVIOUSLY REPORTED AND CALLED VALUE. Performed at [facility], [place] [address], [place], [place] [address]    Culture GRAM POSITIVE COCCI  Final   Report Status PENDING  Incomplete  Aerobic Culture (superficial specimen)     Status: None (Preliminary result)   Collection Time: 02/04/19  6:44 PM  Result Value Ref Range Status   Specimen Description WOUND ARM  Final   Special Requests NONE  Final   Gram Stain   Final    RARE WBC PRESENT,BOTH PMN AND MONONUCLEAR RARE GRAM POSITIVE COCCI Performed at [facility], [place] [address] [place], [place] [address]    Culture PENDING  Incomplete   Report Status PENDING  Incomplete  MRSA PCR Screening     Status: None   Collection Time: 02/04/19  6:56 PM  Result Value Ref Range Status   MRSA by PCR NEGATIVE NEGATIVE Final    Comment:        The GeneXpert MRSA Assay (FDA approved for NASAL specimens only), is one component of a comprehensive MRSA colonization surveillance program. It is not intended to diagnose MRSA infection nor to guide or monitor treatment for MRSA infections. Performed at [facility], [place] [address] [place], [place] [address]      Terri Piedra, [place] [facility] [phone] Pager  02/05/2019  11:12  AM

## 2019-02-05 NOTE — Progress Notes (Addendum)
ANTICOAGULATION CONSULT NOTE - Initial Consult  Pharmacy Consult for apixaban Indication: RUE DVT  No Known Allergies  Patient Measurements: Height: 5\' 9"  (175.3 cm) Weight: 176 lb 2.4 oz (79.9 kg) IBW/kg (Calculated) : 70.7  Vital Signs: Temp: 98.3 F (36.8 C) (03/06 1514) Temp Source: Rectal (03/06 1514) BP: 96/62 (03/06 1514) Pulse Rate: 92 (03/06 1514)  Labs: Recent Labs    02/04/19 1838 02/05/19 0512  HGB 12.7* 13.2  HCT 36.5* 38.9*  PLT 334 391  APTT 34  --   LABPROT 18.0*  --   INR 1.5*  --   CREATININE 0.73 0.56*    Estimated Creatinine Clearance: 82.2 mL/min (A) (by C-G formula based on SCr of 0.56 mg/dL (L)).   Medical History: Past Medical History:  Diagnosis Date  . Diabetes ([place])   . GI bleed   . HLD (hyperlipidemia)   . HTN (hypertension)   . TIA (transient ischemic attack)     Medications:  Medications Prior to Admission  Medication Sig Dispense Refill Last Dose  . atorvastatin (LIPITOR) 10 MG tablet Take 10 mg by mouth daily.   01/30/2019 at Unknown time  . metFORMIN (GLUCOPHAGE) 500 MG tablet Take 500 mg by mouth every morning.    01/30/2019 at Unknown time  . [EXPIRED] oseltamivir (TAMIFLU) 75 MG capsule Take 75 mg by mouth daily.   01/30/2019 at Unknown time    Assessment: 73 y/o male who presented to [facility] ED with sudden right arm weakness found to have an acute stroke. He is s/p tPA on 2/29 at 13:57. Right basilic vein DVT found and pharmacy consulted to begin apixaban. Dr. Algis Liming discussed benefit vs risk of anticoagulation with Dr. Leonie Man and the decision was made to proceed with apixaban. I discussed with Dr. Lorraine Lax whether to give the 7 day load and given potential risk for hemorrhagic transformation, the decision was made not to load the patient. No bleeding noted, but does have a hx GIB. CBC is normal.  Goal of Therapy:  Monitor for bleeding   Plan:  Apixaban 5 mg PO bid Education prior to discharge Pharmacy will sign off but  continue to follow peripherally   Renold Genta, PharmD, BCPS Clinical Pharmacist Clinical phone for 02/05/2019 until 10p is [phone] 02/05/2019 6:19 PM  **Pharmacist phone directory can now be found on [email or web address] listed under [place]**

## 2019-02-05 NOTE — Progress Notes (Addendum)
PHARMACY - PHYSICIAN COMMUNICATION CRITICAL VALUE ALERT - BLOOD CULTURE IDENTIFICATION (BCID)  [place] is an 73 y.o. male who presented to [facility] on 01/30/2019 with a chief complaint of CVA  Assessment:  Pt presented to [facility] with arm weakness. He was given tPA there for stroke and transferred here for further management. I dosed his abx last night after he started to have signs of sepsis. Lab called with the BCID result this AM of 3/4 bottles with MSSA. ID will see for auto consult.   Name of physician (or Provider) Contacted: Dr. Leonie Man and Burnetta Sabin and the ID team  Current antibiotics: vanc/cefepime  Changes to prescribed antibiotics recommended:  Recommend optimizing to nafcillin 2g q4 for CNS penetration   Results for orders placed or performed during the hospital encounter of 01/30/19  Blood Culture ID Panel (Reflexed) (Collected: 02/04/2019  5:30 PM)  Result Value Ref Range   Enterococcus species NOT DETECTED NOT DETECTED   Listeria monocytogenes NOT DETECTED NOT DETECTED   Staphylococcus species DETECTED (A) NOT DETECTED   Staphylococcus aureus (BCID) DETECTED (A) NOT DETECTED   Methicillin resistance NOT DETECTED NOT DETECTED   Streptococcus species NOT DETECTED NOT DETECTED   Streptococcus agalactiae NOT DETECTED NOT DETECTED   Streptococcus pneumoniae NOT DETECTED NOT DETECTED   Streptococcus pyogenes NOT DETECTED NOT DETECTED   Acinetobacter baumannii NOT DETECTED NOT DETECTED   Enterobacteriaceae species NOT DETECTED NOT DETECTED   Enterobacter cloacae complex NOT DETECTED NOT DETECTED   Escherichia coli NOT DETECTED NOT DETECTED   Klebsiella oxytoca NOT DETECTED NOT DETECTED   Klebsiella pneumoniae NOT DETECTED NOT DETECTED   Proteus species NOT DETECTED NOT DETECTED   Serratia marcescens NOT DETECTED NOT DETECTED   Haemophilus influenzae NOT DETECTED NOT DETECTED   Neisseria meningitidis NOT DETECTED NOT DETECTED   Pseudomonas aeruginosa NOT  DETECTED NOT DETECTED   Candida albicans NOT DETECTED NOT DETECTED   Candida glabrata NOT DETECTED NOT DETECTED   Candida krusei NOT DETECTED NOT DETECTED   Candida parapsilosis NOT DETECTED NOT DETECTED   Candida tropicalis NOT DETECTED NOT DETECTED   Onnie Boer, PharmD, BCIDP, AAHIVP, CPP Infectious Disease Pharmacist 02/05/2019 9:29 AM

## 2019-02-05 NOTE — Progress Notes (Signed)
PROGRESS NOTE   Jon Nelson  NTI:[id number]    DOB: November 11, 1946    DOA: 01/30/2019  PCP: Rochel Brome, MD   I have briefly reviewed patients previous medical records in [facility].  Brief Narrative:  73 year old male with PMH of DM 2, HTN, HLD, GI bleed, presented to OSH with sudden onset of right arm weakness, status post TPA 01/30/2019, transferred to [facility] for evaluation by stroke MD, stroke work-up including TEE has been completed and assessed to have cryptogenic stroke, overnight 3/5 developed fever and sepsis for which TRH was consulted. Evaluation revealed right upper extremity cellulitis with positive staph culture on wound and associated bacteremia.  MSSA on BCID.  ID consulted.   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 Sepsis due to RUE staph aureus cellulitis with MSSA bacteremia: Sepsis developed 3/5.  He was treated per sepsis protocol with IV fluids, cultures were drawn and broad-spectrum antibiotics were initiated.  TEE 3/4- for vegetations.  Right upper extremity IV site pus culture shows gram-positive cocci, blood cultures positive for staph and BCID shows MSSA.  ID input appreciated and have changed broad-spectrum antibiotics (cefepime essential hypertension and vancomycin) to IV nafcillin.  Need to get surveillance blood cultures in a.m.  Discussed with Dr. Linus Salmons. 2. Right basilic vein DVT: Noted on right upper extremity ultrasound.  I discussed with Dr. Leonie Man regarding need for anticoagulation and TPA on 2/29.  He recommended starting anticoagulation with Eliquis.  I discussed in detail with patient regarding need for anticoagulation, risks and benefits, options available including warfarin, Eliquis/Xarelto, Pradaxa, risks and benefits and adverse effects of each.  He opted to proceed with Eliquis, started 3/6.  Duration of  anticoagulation: 3 months.   3. Nausea and vomiting: Likely due to above.  Abdominal x-ray suggested possible ileus but no further nausea or vomiting reported today.  Monitor. 4. Acute embolic stroke: Management per primary service/neurology.  Has completed extensive work-up.  Felt to be cryptogenic.  Currently on aspirin 325 mg daily.  Since starting Eliquis for DVT, need to discuss with neurology in a.m. if aspirin can be stopped or if needed dose reduced to 81 mg daily.  I will discontinue aspirin until discussion with neurology in a.m. 5. Essential hypertension: Controlled. 6. Hyperlipidemia: Continue Lipitor. 7. DM2: A1c 6.6.  Controlled.  Continue SSI. 8. Recent flu: Completed course of Tamiflu. 9. Leukocytosis: Due to sepsis.  Follow CBCs. 10. NAGMA: Bicarbonate 15, anion gap 9.  Unclear etiology.  Lactate normal.  Follow BMP now and then closely. 11. Abnormal LFTs: Unclear etiology.  Transient nausea and vomiting yesterday.  Follow LFTs in a.m. and if not improving or worsening, consider further work-up.  Hold tonight's dose of statins.   DVT prophylaxis: Eliquis started 3/6 Code Status: Full Family Communication: None at bedside Disposition: DC home pending clinical improvement.   Consultants:  TRH are consultants. ID  Procedures:  None  Antimicrobials:  IV nafcillin 3/6 >   Subjective: Seen this morning.  Felt better than last night.  Dry cough without dyspnea.  Low back pain on coughing.  Right upper extremity pain and swelling are better.  Denies any other complaints.  Overnight events noted.  ROS: As above, otherwise negative.  Objective:  Vitals:   02/05/19 0541 02/05/19 0814 02/05/19 1114 02/05/19 1514  BP:  120/73 118/80 96/62  Pulse: 100 94 (!) 102 92  Resp: 20 (!) 21 (!) 33 (!) 23  Temp:  99.7 F (37.6 C) 99.8 F (37.7 C) 98.3 F (36.8 C)  TempSrc:  Rectal Rectal Rectal  SpO2:  98% 96% 99%  Weight:      Height:        Examination:  General exam:  Pleasant elderly male, moderately built and nourished lying comfortably propped up in bed. Respiratory system: Slightly diminished breath sounds in the bases but otherwise clear to auscultation. Respiratory effort normal. Cardiovascular system: S1 & S2 heard, RRR. No JVD, murmurs, rubs, gallops or clicks. No pedal edema.  Telemetry personally reviewed: SR-mild ST, BBB morphology. Gastrointestinal system: Abdomen is nondistended, soft and nontender. No organomegaly or masses felt. Normal bowel sounds heard. Central nervous system: Alert and oriented. No focal neurological deficits. Extremities: Symmetric 5 x 5 power in all extremities except right upper extremity with grade 4+ by 5 power.. Skin: Induration, faint erythema without tenderness over medial aspect of cubital fossa.  No fluctuation or crepitus. Psychiatry: Judgement and insight appear normal. Mood & affect flat.     Data Reviewed: I have personally reviewed following labs and imaging studies  CBC: Recent Labs  Lab 02/04/19 1838 02/05/19 0512  WBC 12.5* 19.4*  NEUTROABS 12.1* 18.4*  HGB 12.7* 13.2  HCT 36.5* 38.9*  MCV 84.9 85.5  PLT 334 174   Basic Metabolic Panel: Recent Labs  Lab 02/04/19 1838 02/05/19 0512  NA 129* 131*  K 4.0 4.3  CL 106 107  CO2 19* 15*  GLUCOSE 141* 161*  BUN 16 17  CREATININE 0.73 0.56*  CALCIUM 7.4* 7.5*   Liver Function Tests: Recent Labs  Lab 02/04/19 1838  AST 213*  ALT 146*  ALKPHOS 278*  BILITOT 1.1  PROT 5.7*  ALBUMIN 2.3*   Coagulation Profile: Recent Labs  Lab 02/04/19 1838  INR 1.5*   CBG: Recent Labs  Lab 02/04/19 0802 02/04/19 1211 02/04/19 1624 02/05/19 1025 02/05/19 1300  GLUCAP 129* 189* 110* 126* 112*    Recent Results (from the past 240 hour(s))  MRSA PCR Screening     Status: None   Collection Time: 01/30/19  6:00 PM  Result Value Ref Range Status   MRSA by PCR NEGATIVE NEGATIVE Final    Comment:        The GeneXpert MRSA Assay (FDA approved  for NASAL specimens only), is one component of a comprehensive MRSA colonization surveillance program. It is not intended to diagnose MRSA infection nor to guide or monitor treatment for MRSA infections. Performed at [facility], [place] [address], [place], [place] [address]   Culture, blood (single)     Status: None (Preliminary result)   Collection Time: 02/04/19  5:30 PM  Result Value Ref Range Status   Specimen Description BLOOD RIGHT ARM  Final   Special Requests   Final    BOTTLES DRAWN AEROBIC AND ANAEROBIC Blood Culture adequate volume   Culture  Setup Time   Final    GRAM POSITIVE COCCI IN BOTH AEROBIC AND ANAEROBIC BOTTLES Organism ID to follow CRITICAL RESULT CALLED TO, READ BACK BY AND VERIFIED WITH: Ellin Mayhew [facility] [id number] MLM Performed at [facility], [place] [address] [place], [place] [address]    Culture [facility] POSITIVE COCCI  Final   Report Status PENDING  Incomplete  Blood Culture ID Panel (Reflexed)     Status: Abnormal   Collection Time: 02/04/19  5:30 PM  Result Value Ref Range Status   Enterococcus species NOT DETECTED NOT DETECTED Final   Listeria monocytogenes NOT DETECTED NOT DETECTED Final   Staphylococcus species DETECTED (A) NOT DETECTED Final    Comment: CRITICAL RESULT CALLED TO, READ BACK BY AND VERIFIED WITH: PHARMD M PHAM [id number] MLM    Staphylococcus aureus (BCID) DETECTED (A) NOT DETECTED Final    Comment: Methicillin (oxacillin) susceptible Staphylococcus aureus (MSSA). Preferred therapy is anti staphylococcal beta lactam antibiotic (Cefazolin or Nafcillin), unless clinically contraindicated. CRITICAL RESULT CALLED TO, READ BACK BY AND VERIFIED WITH: PHARMD M PHAM [id number] MLM    Methicillin resistance NOT DETECTED NOT DETECTED Final   Streptococcus species NOT DETECTED NOT DETECTED Final   Streptococcus agalactiae NOT DETECTED NOT DETECTED Final   Streptococcus pneumoniae NOT DETECTED NOT DETECTED Final   Streptococcus  pyogenes NOT DETECTED NOT DETECTED Final   Acinetobacter baumannii NOT DETECTED NOT DETECTED Final   Enterobacteriaceae species NOT DETECTED NOT DETECTED Final   Enterobacter cloacae complex NOT DETECTED NOT DETECTED Final   Escherichia coli NOT DETECTED NOT DETECTED Final   Klebsiella oxytoca NOT DETECTED NOT DETECTED Final   Klebsiella pneumoniae NOT DETECTED NOT DETECTED Final   Proteus species NOT DETECTED NOT DETECTED Final   Serratia marcescens NOT DETECTED NOT DETECTED Final   Haemophilus influenzae NOT DETECTED NOT DETECTED Final   Neisseria meningitidis NOT DETECTED NOT DETECTED Final   Pseudomonas aeruginosa NOT DETECTED NOT DETECTED Final   Candida albicans NOT DETECTED NOT DETECTED Final   Candida glabrata NOT DETECTED NOT DETECTED Final   Candida krusei NOT DETECTED NOT DETECTED Final   Candida parapsilosis NOT DETECTED NOT DETECTED Final   Candida tropicalis NOT DETECTED NOT DETECTED Final    Comment: Performed at [facility], [address] [place], [place] [address]  Culture, blood (Routine X 2) w Reflex to ID Panel     Status: None (Preliminary result)   Collection Time: 02/04/19  6:40 PM  Result Value Ref Range Status   Specimen Description BLOOD RIGHT HAND  Final   Special Requests   Final    BOTTLES DRAWN AEROBIC AND ANAEROBIC Blood Culture adequate volume   Culture  Setup Time   Final    GRAM POSITIVE COCCI IN CLUSTERS IN BOTH AEROBIC AND ANAEROBIC BOTTLES CRITICAL VALUE NOTED.  VALUE IS CONSISTENT WITH PREVIOUSLY REPORTED AND CALLED VALUE. Performed at [facility], [place] [address], [place], [place] [address]    Culture GRAM POSITIVE COCCI  Final   Report Status PENDING  Incomplete  Aerobic Culture (superficial specimen)     Status: None (Preliminary result)   Collection Time: 02/04/19  6:44 PM  Result Value Ref Range Status   Specimen Description WOUND ARM  Final   Special Requests NONE  Final   Gram Stain   Final    RARE WBC PRESENT,BOTH PMN  AND MONONUCLEAR RARE GRAM POSITIVE COCCI    Culture   Final    MODERATE STAPHYLOCOCCUS AUREUS SUSCEPTIBILITIES TO FOLLOW Performed at [facility], [place] [address] [place], [place] [address]    Report Status PENDING  Incomplete  MRSA PCR Screening     Status: None   Collection Time: 02/04/19  6:56 PM  Result Value Ref Range Status   MRSA by PCR NEGATIVE NEGATIVE Final    Comment:        The GeneXpert MRSA Assay (FDA approved for NASAL specimens only), is one component of a comprehensive MRSA colonization surveillance  program. It is not intended to diagnose MRSA infection nor to guide or monitor treatment for MRSA infections. Performed at [facility], [place] [address], [place], [place] [address]          Radiology Studies: Dg Abd 1 View  Result Date: 02/04/2019 CLINICAL DATA:  Nausea and vomiting EXAM: ABDOMEN - 1 VIEW COMPARISON:  None. FINDINGS: Diffuse increased small and large bowel gas. Moderate stool in the colon. No radiopaque calculi. IMPRESSION: Mild diffuse increased bowel gas throughout, possible ileus. Electronically Signed   By: Donavan Foil M.D.   On: 02/04/2019 20:14   Dg Chest Port 1 View  Result Date: 02/04/2019 CLINICAL DATA:  Pneumonia EXAM: PORTABLE CHEST 1 VIEW COMPARISON:  None. FINDINGS: Right lung is clear. Borderline cardiomegaly. Patchy atelectasis or minimal infiltrate at the left base. Aortic atherosclerosis. IMPRESSION: Patchy atelectasis or minimal infiltrate at the left base Electronically Signed   By: Donavan Foil M.D.   On: 02/04/2019 20:13   [place] Rt Upper Extrem Ltd Soft Tissue Non Vascular  Result Date: 02/04/2019 CLINICAL DATA:  Possible abscess, right arm. Status post IV. Right antecubital redness. EXAM: ULTRASOUND RIGHT UPPER EXTREMITY LIMITED TECHNIQUE: Ultrasound examination of the upper extremity soft tissues was performed in the area of clinical concern. COMPARISON:  None. FINDINGS: There is thrombus in the basilic vein  extending from the upper arm across the antecubital fossa, beginning just below the axillary vein confluence. There is associated diffuse soft tissue edema with some increased echogenicity of the subcutaneous fat consistent with inflammation. However, there is no abscess. No mass. IMPRESSION: 1. No abscess. 2. Acute thrombosis in the basilic vein with associated soft tissue edema and inflammation. Electronically Signed   By: Lajean Manes M.D.   On: 02/04/2019 20:25        Scheduled Meds: . aspirin  325 mg Oral Daily  . atorvastatin  40 mg Oral q1800  . feeding supplement (GLUCERNA SHAKE)  237 mL Oral TID BM  . insulin aspart  0-9 Units Subcutaneous TID WC  . pantoprazole  40 mg Oral QHS   Continuous Infusions: . sodium chloride 100 mL/hr at 02/05/19 1646  . nafcillin IV 2 g (02/05/19 1440)     LOS: 6 days     Vernell Leep, MD, FACP, [facility]. [facility]  To contact the attending provider between 7A-7P or the covering provider during after hours 7P-7A, please log into the web site [email or web address] and access using universal [place] password for that web site. If you do not have the password, please call the hospital operator.  02/05/2019, 5:04 PM

## 2019-02-06 DIAGNOSIS — D72829 Elevated white blood cell count, unspecified: Secondary | ICD-10-CM

## 2019-02-06 DIAGNOSIS — I82629 Acute embolism and thrombosis of deep veins of unspecified upper extremity: Secondary | ICD-10-CM

## 2019-02-06 LAB — AEROBIC CULTURE W GRAM STAIN (SUPERFICIAL SPECIMEN)

## 2019-02-06 LAB — HEPATIC FUNCTION PANEL
ALK PHOS: 238 U/L — AB (ref 38–126)
ALT: 96 U/L — ABNORMAL HIGH (ref 0–44)
AST: 93 U/L — ABNORMAL HIGH (ref 15–41)
Albumin: 2 g/dL — ABNORMAL LOW (ref 3.5–5.0)
Bilirubin, Direct: 0.7 mg/dL — ABNORMAL HIGH (ref 0.0–0.2)
Indirect Bilirubin: 1.2 mg/dL — ABNORMAL HIGH (ref 0.3–0.9)
TOTAL PROTEIN: 5.2 g/dL — AB (ref 6.5–8.1)
Total Bilirubin: 1.9 mg/dL — ABNORMAL HIGH (ref 0.3–1.2)

## 2019-02-06 LAB — GLUCOSE, CAPILLARY
Glucose-Capillary: 115 mg/dL — ABNORMAL HIGH (ref 70–99)
Glucose-Capillary: 125 mg/dL — ABNORMAL HIGH (ref 70–99)
Glucose-Capillary: 130 mg/dL — ABNORMAL HIGH (ref 70–99)
Glucose-Capillary: 140 mg/dL — ABNORMAL HIGH (ref 70–99)
Glucose-Capillary: 152 mg/dL — ABNORMAL HIGH (ref 70–99)

## 2019-02-06 LAB — CBC
HCT: 38.6 % — ABNORMAL LOW (ref 39.0–52.0)
Hemoglobin: 12.8 g/dL — ABNORMAL LOW (ref 13.0–17.0)
MCH: 29 pg (ref 26.0–34.0)
MCHC: 33.2 g/dL (ref 30.0–36.0)
MCV: 87.3 fL (ref 80.0–100.0)
Platelets: 360 10*3/uL (ref 150–400)
RBC: 4.42 MIL/uL (ref 4.22–5.81)
RDW: 13 % (ref 11.5–15.5)
WBC: 12.5 10*3/uL — AB (ref 4.0–10.5)
nRBC: 0 % (ref 0.0–0.2)

## 2019-02-06 LAB — BASIC METABOLIC PANEL
Anion gap: 10 (ref 5–15)
BUN: 16 mg/dL (ref 8–23)
CO2: 19 mmol/L — AB (ref 22–32)
Calcium: 7.9 mg/dL — ABNORMAL LOW (ref 8.9–10.3)
Chloride: 108 mmol/L (ref 98–111)
Creatinine, Ser: 0.86 mg/dL (ref 0.61–1.24)
GFR calc Af Amer: >60 mL/min (ref >60)
GFR calc non Af Amer: >60 mL/min (ref >60)
Glucose, Bld: 142 mg/dL — ABNORMAL HIGH (ref 70–99)
Potassium: 3.6 mmol/L (ref 3.5–5.1)
Sodium: 137 mmol/L (ref 135–145)

## 2019-02-06 LAB — RAPID URINE DRUG SCREEN, HOSP PERFORMED
Amphetamines: NOT DETECTED
Barbiturates: NOT DETECTED
Benzodiazepines: NOT DETECTED
COCAINE: NOT DETECTED
Opiates: POSITIVE — AB
Tetrahydrocannabinol: NOT DETECTED

## 2019-02-06 LAB — PROCALCITONIN: Procalcitonin: 13.52 ng/mL

## 2019-02-06 MED ORDER — VANCOMYCIN HCL IN DEXTROSE 1-5 GM/200ML-% IV SOLN
1000.0000 mg | Freq: Two times a day (BID) | INTRAVENOUS | Status: DC
  Administered 2019-02-07 – 2019-02-08 (×4): 1000 mg via INTRAVENOUS
  Filled 2019-02-06 (×5): qty 200

## 2019-02-06 MED ORDER — VANCOMYCIN HCL 10 G IV SOLR
1500.0000 mg | INTRAVENOUS | Status: AC
  Administered 2019-02-06: 1500 mg via INTRAVENOUS
  Filled 2019-02-06 (×2): qty 1500

## 2019-02-06 MED ORDER — ATORVASTATIN CALCIUM 40 MG PO TABS: 40.0000 mg | ORAL_TABLET | Freq: Every day | ORAL | Status: DC

## 2019-02-06 NOTE — Progress Notes (Signed)
Physical Therapy Treatment Patient Details Name: Jon Nelson MRN: [id number] DOB: 10-07-1946 Today's Date: 02/06/2019    History of Present Illness Jon Nelson is an 73 y.o. male  With PMH diabetes, HTN, depression who presented to [facility] ED with c/o of sudden right arm weakness. Patient transferred to [facility] s/p TPA.  MRI showed small scattered foci of acute/early subacute infarction are present within the left posterosuperior frontal and parietal lobes inclusive of precentral gyrus. No hemorrhage or mass effect.    PT Comments    Pt reports feeling weak today but agreeable to participate in therapy. During ambulation SpO2 dropped to 88% prior to returning to bed. Once seated sats returned to >90%. Pt required min guard for OOB activity and use of IV pole to steady. Patient would benefit from continued skilled PT to maximize safety with mobility and functional independence. Will continue to follow acutely.     Follow Up Recommendations  Home health PT     Equipment Recommendations  None recommended by PT    Recommendations for Other Services       Precautions / Restrictions Precautions Precautions: Fall Restrictions Weight Bearing Restrictions: No    Mobility  Bed Mobility Overal bed mobility: Needs Assistance Bed Mobility: Supine to Sit     Supine to sit: Min guard Sit to supine: Min guard   General bed mobility comments: Required increased time and effort for bed mobilities.  Transfers Overall transfer level: Needs assistance Equipment used: None Transfers: Sit to/from Stand Sit to Stand: Min assist(from bed and the toilet)         General transfer comment: Pt grasping for HHA to rise from EOB. Able to rise from toilet w/o assist.  Ambulation/Gait Ambulation/Gait assistance: Min guard Gait Distance (Feet): 125 Feet Assistive device: IV Pole Gait Pattern/deviations: Step-through pattern     General Gait Details: pt mildly unsteady requiring IV pole for  balance.    Stairs             Wheelchair Mobility    Modified Rankin (Stroke Patients Only) Modified Rankin (Stroke Patients Only) Pre-Morbid Rankin Score: No symptoms Modified Rankin: Moderately severe disability     Balance Overall balance assessment: Needs assistance   Sitting balance-Leahy Scale: Good     Standing balance support: No upper extremity supported;During functional activity Standing balance-Leahy Scale: Fair(to poor today due to feeling poor and in bed all day)                              Cognition Arousal/Alertness: Awake/alert Behavior During Therapy: WFL for tasks assessed/performed Overall Cognitive Status: Within Functional Limits for tasks assessed                                 General Comments: Slow processing      Exercises      General Comments General comments (skin integrity, edema, etc.): assisted to restroom for toileting. Pt able to perform pericare with min guard for safety.      Pertinent Vitals/Pain Pain Assessment: No/denies pain    Home Living                      Prior Function            PT Goals (current goals can now be found in the care plan section) Acute Rehab PT Goals Patient Stated Goal:  independent at home PT Goal Formulation: With patient Time For Goal Achievement: 02/08/19 Potential to Achieve Goals: Good Progress towards PT goals: Progressing toward goals    Frequency    Min 3X/week      PT Plan Current plan remains appropriate    Co-evaluation              AM-PAC PT "6 Clicks" Mobility   Outcome Measure  Help needed turning from your back to your side while in a flat bed without using bedrails?: A Little Help needed moving from lying on your back to sitting on the side of a flat bed without using bedrails?: A Little Help needed moving to and from a bed to a chair (including a wheelchair)?: A Little Help needed standing up from a chair using  your arms (e.g., wheelchair or bedside chair)?: A Little Help needed to walk in hospital room?: A Little Help needed climbing 3-5 steps with a railing? : A Little 6 Click Score: 18    End of Session Equipment Utilized During Treatment: Gait belt Activity Tolerance: Patient tolerated treatment well Patient left: in bed;with call bell/phone within reach Nurse Communication: Mobility status PT Visit Diagnosis: Unsteadiness on feet (R26.81)     Time: 8875-7972 PT Time Calculation (min) (ACUTE ONLY): 35 min  Charges:  $Gait Training: 8-22 mins $Therapeutic Activity: 8-22 mins                     Benjiman Core, [place] Pager [phone] Acute Rehab    Allena Katz 02/06/2019, 12:53 PM

## 2019-02-06 NOTE — Progress Notes (Signed)
OT Cancellation Note  Patient Details Name: Jon Nelson MRN: [id number] DOB: 1946/09/29   Cancelled Treatment:    Reason Eval/Treat Not Completed: Patient declined, no reason specified, reports "not feeling well" and "would like to visit with family".  Will follow.  Delight Stare, OT Acute Rehabilitation Services Pager [phone] Office [phone]   Delight Stare 02/06/2019, 4:38 PM

## 2019-02-06 NOTE — Progress Notes (Signed)
CSW met with the patient at bedside. Patient was alert and oriented. Patient was finishing up with physical therapy. CSW spoke with the patient about his discharge and expressed concern that we didn't want him to return home without any assistance. Patient stated that he had his brother in law and sister to help him rehabilitate at their home.   CSW called the patient's sister Lorelle Formosa to verify that the patient would be discharging to their home. She agreed that he would be staying with her until he is strong enough to return home on his own.   Will need assistance coordinating home health equipment.   CSW will continue to follow.   Domenic Schwab, MSW, [place]

## 2019-02-06 NOTE — Progress Notes (Signed)
STROKE TEAM NOTE   SUBJECTIVE Patient RN at bedside, about to have bath.  Patient lying in bed, no complaints, denies any headache or arm pain.  Right arm still mildly swollen, but no redness.  Currently on Eliquis for right basilic vein thrombosis.  OBJECTIVE Vitals:   02/06/19 0015 02/06/19 0238 02/06/19 0345 02/06/19 0546  BP: (!) 117/50  (!) 104/58 100/68  Pulse: (!) 108 95 96 89  Resp: (!) 27 19 (!) 22 16  Temp: 99 F (37.2 C) 98.8 F (37.1 C)  98.9 F (37.2 C)  TempSrc: Rectal Rectal  Rectal  SpO2: 98% 96% 98% 97%  Weight:      Height:        Lipid Panel:     Component Value Date/Time   CHOL 120 01/31/2019 0513   TRIG 137 01/31/2019 0513   HDL 22 (L) 01/31/2019 0513   CHOLHDL 5.5 01/31/2019 0513   VLDL 27 01/31/2019 0513   LDLCALC 71 01/31/2019 0513   HgbA1c:  Lab Results  Component Value Date   HGBA1C 6.6 (H) 01/31/2019   IMAGING  CTA of H/N at [facility]  01/30/2019  No LVO but cervical carotid artery atherosclerosis, prominent soft plaque in the left carotid bulb, atherosclerosis with mild right and moderate left cavernous ICA stenosis and mod-severe right P1 stenosis. Moderate distal left vertebral artery stenosis.   CT HEAD WO CONTRAST  01/30/2019 Was negative for acute event at [facility], moderate small-vessel ischemic changes  Mr Brain Wo Contrast 01/31/2019 1. Small scattered foci of acute/early subacute infarction are present within the left posterosuperior frontal and parietal lobes inclusive of precentral gyrus. No hemorrhage or mass effect. 2. Moderate chronic microvascular ischemic changes and volume loss of the brain. 3. Paranasal sinus disease with fluid levels which may represent acute sinusitis. Partial bilateral mastoid opacification.   Carotid Doppler   02/01/2019 There is 1-39% bilateral ICA stenosis. Vertebral artery flow is antegrade.    Transthoracic Echocardiogram  1. The left ventricle has normal systolic function with an ejection fraction  of 60-65%. The cavity size was normal. Left ventricular diastolic Doppler parameters are consistent with impaired relaxation.  2. The right ventricle has normal systolic function. The cavity was normal. There is no increase in right ventricular wall thickness.  3. The mitral valve is normal in structure.  4. The tricuspid valve is normal in structure.  5. The aortic valve is tricuspid.  6. The pulmonic valve was normal in structure.  7. Normal LV systolic function; mild diastolic dysfunction.  8. The interatrial septum appears to be lipomatous.  TEE 02/03/2019 IMPRESSIONS  1. The left ventricle has normal systolic function, with an ejection fraction of 60-65%. The cavity size was normal.  2. The right ventricle has normal systolc function. The cavity was normal. There is no increase in right ventricular wall thickness.  3. The tricuspid valve was normal in structure.  4. The aortic valve is tricuspid Mild thickening of the aortic valve Mild calcification of the aortic valve. Aortic valve regurgitation is trivial by color flow Doppler.  5. The pulmonic valve was normal in structure.  6. The aortic root is normal in size and structure.   Blood Cultures 02/04/19 ; gram-positive cocci in clusters in both aerobic and anaerobic bottles staph aureus  Physical Exam    pleasant elderly   male not in distress. . Afebrile. Head is nontraumatic. Neck is supple without bruit.    Cardiac exam no murmur or gallop. Lungs are clear to auscultation.  Distal pulses are well felt.  Right upper extremity mildly swollen Neurological Exam ;  Awake alert oriented 3 with normal speech and language function. No aphasia, apraxia dysarthria. Extraocular moments are full range without nystagmus. Blinks to threat bilaterally. Fundi not visualized. Face is symmetric without weakness. Tongue midline. Motor system exam reveals left upper extremity 3/5 and left lower extremity 4+/5. DTRs are symmetric. Sensation is intact.  Plantars are downgoing. Gait not tested.  ASSESSMENT/PLAN Jon Nelson is a 73 y.o. male with history of diabetes, HTN, TIA, GIB, HLD, depression presenting with sudden right arm weakness.  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   CTA H&N - cervical carotid artery atherosclerosis, prominent soft plaque in the left carotid bulb, atherosclerosis with mild right and moderate left cavernous ICA stenosis and mod-severe right P1 stenosis. Moderate distal left vertebral artery stenosis.   Carotid Doppler left ICA 40 to 59% stenosis    TEE 02/03/2019 - EF 60 - 65%. No PFO  Loop recorder placed  LDL - 71  HgbA1c - 6.6  UDS - pending  VTE prophylaxis - SCDs  No antithrombotic prior to admission, now on Eliquis 5 mg twice daily due to right arm DVT.  Continue Eliquis for 3 months and then switched to aspirin 325 daily.  Therapy recommendations:  Home Health PT recommended  Disposition:  Pending  Left carotid stenosis  CTA head and neck showed left ICA prominent soft plaque at ICA bulb  Carotid Doppler left ICA 40 to 59% stenosis  Vascular surgery Dr. Donzetta Matters consulted  Decided no intervention needed at this time, will follow-up in office in 6 months with Dr. Donzetta Matters  Right arm basilic vein thrombosis  Right upper extremity Doppler confirmed  On Eliquis 5 mg twice daily  Likely provoked due to septic phlebitis  Continue AC for 3 months and then aspirin 325 daily  Bacteremia and right upper extremity cellulitis  Fever resolved  Leukocytosis improving, WBC 12.5-19.4-12.5  TEE 02/03/2019 no  endocarditis  Right upper extremity IV site pus culture shows gram-positive cocci  blood cultures 02/04/2019 positive for staph and BCID shows MSSA  Blood culture 02/06/2019 pending  ID on board  On vancomycin now  Hypertension  Stable . Long-term BP goal normotensive  Hyperlipidemia  Lipid lowering medication PTA:  none  LDL 71, goal < 70  Lipitor on hold due to elevated AST/ALT  Diabetes  HgbA1c 6.6, at goal < 7.0  Controlled  SSI  CBG monitoring  Other Stroke Risk Factors  Advanced age  Other Active Problems  Recent flu like sxs - on Tamiflu - droplet precautions dc'd 3/4/ 2020  Elevated LFTs - improving   Hospital day # 7   Rosalin Hawking, MD PhD Stroke Neurology 02/06/2019 5:36 PM    To contact Stroke Continuity provider, please refer to [email or web address] After hours, contact General Neurology

## 2019-02-06 NOTE — Progress Notes (Signed)
Pharmacy Antibiotic Note  Jon Nelson is a 73 y.o. male admitted on 01/30/2019 with sepsis. Currently on Nafcillin for MSSA Bacteremia. WBC 19.4>12.5, Leukocytosis improved, afebrile, Tm 99.8. Today the wound culture of the arm resulted with MRSA.  ID consulted.   Pharmacy consulted for Vancomycin dosing. ID has discontinued the nafcillin. ID noted that TEE recently negative.  Previously received Vancomycin 1500 mg IV dose x1 on 02/04/19 @ 20:00 then vancomycin 1250 mg IV q12h x 1 dose on 3/6 @ 0820.   SCr 0.86 remains within normal limits.  UOP 1400 ml (0.7 ml/kg/hr)   Vancomycin 1000 mg IV Q 12 hrs. Goal AUC 400-550. Expected AUC: 512 SCr used: 0.86  Plan: Vancomycin 1500 mg IV x1 then 1000 mg IV q12h  Goal AUC 400-550. Expected AUC: 512 SCr used: 0.86 Nafcillin discontinued by ID Monitor clinical progress, renal function. Check steady state vancomycin levels per protocol if needed.     Height: 5\' 9"  (175.3 cm) Weight: 176 lb 2.4 oz (79.9 kg) IBW/kg (Calculated) : 70.7  Temp (24hrs), Avg:98.9 F (37.2 C), Min:98.3 F (36.8 C), Max:99.8 F (37.7 C)  Recent Labs  Lab 02/04/19 1838 02/04/19 2106 02/05/19 0512 02/05/19 1838 02/06/19 0713  WBC 12.5*  --  19.4*  --  12.5*  CREATININE 0.73  --  0.56* 0.86 0.86  LATICACIDVEN 1.4 1.3  --   --   --     Estimated Creatinine Clearance: 76.5 mL/min (by C-G formula based on SCr of 0.86 mg/dL).    No Known Allergies  Antimicrobials this admission: 3/5 vanc>>3/6 3/5 cefepime>>3/6 3/6 nafcillin>>3/7   Dose adjustments this admission:   Microbiology results:  3/7 BCx:  ngtd 3/5 blood>>MSSA 3/5 MRSA PCR: neg 3/5 Wound arm: MRSA 2/29 MRSA>>neg    Thank you for allowing pharmacy to be part of this patients care team. Nicole Cella, [place] Pharmacist [phone] Please check AMION for all [place] phone numbers After 10:00 PM, call [place] 02/06/2019 3:12 PM

## 2019-02-06 NOTE — Progress Notes (Signed)
PROGRESS NOTE   Jon Nelson  VQQ:[id number]    DOB: 11/26/1946    DOA: 01/30/2019  PCP: Rochel Brome, MD   I have briefly reviewed patients previous medical records in [facility].  Brief Narrative:  73 year old male with PMH of DM 2, HTN, HLD, GI bleed, presented to OSH with sudden onset of right arm weakness, status post TPA 01/30/2019, transferred to [facility] for evaluation by stroke MD, stroke work-up including TEE has been completed and assessed to have cryptogenic stroke, overnight 3/5 developed fever and sepsis for which TRH was consulted. Evaluation revealed right upper extremity cellulitis with positive staph culture on wound and associated bacteremia.  MSSA on BCID.  ID consulted.  Also right upper extremity basilic vein DVT, now on Eliquis.   Assessment & Plan:   Principal Problem:   Cryptogenic stroke ([place]) L brain infarcts s/p tPA Active Problems:   Diabetes ([place])   HTN (hypertension)   Hyperlipidemia   Flu   1. Sepsis due to RUE staph aureus cellulitis with MSSA bacteremia: Sepsis developed 3/5.  He was treated per sepsis protocol with IV fluids, cultures were drawn and broad-spectrum antibiotics were initiated.  TEE 3/4- for vegetations.  Right upper extremity IV site pus culture shows gram-positive cocci, blood cultures positive for staph and BCID shows MSSA.  ID had changed broad-spectrum antibiotics to nafcillin.  However on 3/7, pus culture shows MRSA, blood culture sensitivities are still not back.  ID changed nafcillin to vancomycin pending blood culture sensitivities.  Surveillance blood culture sent 3/7.  Improved, afebrile, WBC down to 12.5.  I discussed with Dr. Linus Salmons, ID. 2. Right basilic vein DVT: Noted on right upper extremity ultrasound.  After confirming with stroke MD, Eliquis started 3/6.  Duration of anticoagulation: 3 months.  Elevate right upper extremity to reduce swelling. 3. Nausea and vomiting: Likely due to above.  Abdominal x-ray suggested possible  ileus but no further nausea or vomiting reported today.  Resolved. 4. Acute embolic stroke: Management per primary service/neurology.  Has completed extensive work-up.  Felt to be cryptogenic.  Was on aspirin 325 mg daily.  Since started Eliquis for DVT, I discussed with Dr. Erlinda Hong, Stroke MD on 3/7 who agrees with discontinuing aspirin while on Eliquis.  Resume low-dose aspirin after Eliquis completed. 5. Essential hypertension: Controlled. 6. Hyperlipidemia: Continue Lipitor. 7. DM2: A1c 6.6.  Controlled.  Continue SSI. 8. Recent flu: Completed course of Tamiflu. 9. Leukocytosis: Due to sepsis.  Improving. 10. NAGMA: Bicarbonate 15, anion gap 9 on 3/6.  Unclear etiology.  Lactate normal.  Bicarbonate up to 19, better.  Follow BMP. 11. Abnormal LFTs: Unclear etiology.  Transient nausea and vomiting yesterday.  LFTs have improved.  Continue to hold Lipitor for additional night tonight and if LFTs normalize, may consider resuming in a.m.   DVT prophylaxis: Eliquis started 3/6 Code Status: Full Family Communication: None at bedside Disposition: DC home pending clinical improvement.   Consultants:  TRH are consultants. ID  Procedures:  None  Antimicrobials:  IV nafcillin 3/6 >   Subjective: States that he feels much better.  Denies complaints.  Has some pain in right upper extremity.  Cough almost resolved.  No fevers.  ROS: As above, otherwise negative.  Objective:  Vitals:   02/06/19 0345 02/06/19 0546 02/06/19 0815 02/06/19 1443  BP: (!) 104/58 100/68 123/73 120/68  Pulse: 96 89 93 99  Resp: (!) 22 16  18   Temp:  98.9 F (37.2 C) 98.7 F (37.1 C) 99.7 F (  37.6 C)  TempSrc:  Rectal Rectal Rectal  SpO2: 98% 97%  97%  Weight:      Height:        Examination:  General exam: Pleasant elderly male, moderately built and nourished lying comfortably propped up in bed.  Does not appear in any distress. Respiratory system: Clear to auscultation.  No increased work of  breathing. Cardiovascular system: S1 & S2 heard, RRR. No JVD, murmurs, rubs, gallops or clicks. No pedal edema.  Telemetry personally reviewed: SR, mild ST with BBB morphology, low voltage. Gastrointestinal system: Abdomen is nondistended, soft and nontender. No organomegaly or masses felt. Normal bowel sounds heard.  Stable. Central nervous system: Alert and oriented. No focal neurological deficits.  Stable. Extremities: Symmetric 5 x 5 power in all extremities except right upper extremity with grade 4+ by 5 power.. Skin: Induration, faint erythema without tenderness over medial aspect of cubital fossa, these are better but upper extremity is just mildly diffusely swollen.  No fluctuation or crepitus. Psychiatry: Judgement and insight appear normal. Mood & affect flat.     Data Reviewed: I have personally reviewed following labs and imaging studies  CBC: Recent Labs  Lab 02/04/19 1838 02/05/19 0512 02/06/19 0713  WBC 12.5* 19.4* 12.5*  NEUTROABS 12.1* 18.4*  --   HGB 12.7* 13.2 12.8*  HCT 36.5* 38.9* 38.6*  MCV 84.9 85.5 87.3  PLT 334 391 287   Basic Metabolic Panel: Recent Labs  Lab 02/04/19 1838 02/05/19 0512 02/05/19 1838 02/06/19 0713  NA 129* 131* 136 137  K 4.0 4.3 4.1 3.6  CL 106 107 110 108  CO2 19* 15* 19* 19*  GLUCOSE 141* 161* 161* 142*  BUN 16 17 18 16   CREATININE 0.73 0.56* 0.86 0.86  CALCIUM 7.4* 7.5* 7.6* 7.9*   Liver Function Tests: Recent Labs  Lab 02/04/19 1838 02/06/19 0713  AST 213* 93*  ALT 146* 96*  ALKPHOS 278* 238*  BILITOT 1.1 1.9*  PROT 5.7* 5.2*  ALBUMIN 2.3* 2.0*   Coagulation Profile: Recent Labs  Lab 02/04/19 1838  INR 1.5*   CBG: Recent Labs  Lab 02/05/19 1300 02/05/19 1733 02/06/19 0022 02/06/19 0829 02/06/19 1224  GLUCAP 112* 169* 130* 140* 115*    Recent Results (from the past 240 hour(s))  MRSA PCR Screening     Status: None   Collection Time: 01/30/19  6:00 PM  Result Value Ref Range Status   MRSA by PCR  NEGATIVE NEGATIVE Final    Comment:        The GeneXpert MRSA Assay (FDA approved for NASAL specimens only), is one component of a comprehensive MRSA colonization surveillance program. It is not intended to diagnose MRSA infection nor to guide or monitor treatment for MRSA infections. Performed at [facility], [place] [address], [place], [place] [address]   Culture, blood (single)     Status: Abnormal (Preliminary result)   Collection Time: 02/04/19  5:30 PM  Result Value Ref Range Status   Specimen Description BLOOD RIGHT ARM  Final   Special Requests   Final    BOTTLES DRAWN AEROBIC AND ANAEROBIC Blood Culture adequate volume   Culture  Setup Time   Final    GRAM POSITIVE COCCI IN BOTH AEROBIC AND ANAEROBIC BOTTLES Organism ID to follow CRITICAL RESULT CALLED TO, READ BACK BY AND VERIFIED WITH: PHARMD M PHAM [id number] MLM    Culture (A)  Final    STAPHYLOCOCCUS AUREUS SUSCEPTIBILITIES TO FOLLOW Performed at [facility], [address], [place], [place] [address]    Report Status PENDING  Incomplete  Blood Culture ID Panel (Reflexed)     Status: Abnormal   Collection Time: 02/04/19  5:30 PM  Result Value Ref Range Status   Enterococcus species NOT DETECTED NOT DETECTED Final   Listeria monocytogenes NOT DETECTED NOT DETECTED Final   Staphylococcus species DETECTED (A) NOT DETECTED Final    Comment: CRITICAL RESULT CALLED TO, READ BACK BY AND VERIFIED WITH: PHARMD M PHAM [id number] MLM    Staphylococcus aureus (BCID) DETECTED (A) NOT DETECTED Final    Comment: Methicillin (oxacillin) susceptible Staphylococcus aureus (MSSA). Preferred therapy is anti staphylococcal beta lactam antibiotic (Cefazolin or Nafcillin), unless clinically contraindicated. CRITICAL RESULT CALLED TO, READ BACK BY AND VERIFIED WITH: PHARMD M PHAM [id number] MLM    Methicillin resistance NOT DETECTED NOT DETECTED Final   Streptococcus species NOT DETECTED NOT DETECTED Final    Streptococcus agalactiae NOT DETECTED NOT DETECTED Final   Streptococcus pneumoniae NOT DETECTED NOT DETECTED Final   Streptococcus pyogenes NOT DETECTED NOT DETECTED Final   Acinetobacter baumannii NOT DETECTED NOT DETECTED Final   Enterobacteriaceae species NOT DETECTED NOT DETECTED Final   Enterobacter cloacae complex NOT DETECTED NOT DETECTED Final   Escherichia coli NOT DETECTED NOT DETECTED Final   Klebsiella oxytoca NOT DETECTED NOT DETECTED Final   Klebsiella pneumoniae NOT DETECTED NOT DETECTED Final   Proteus species NOT DETECTED NOT DETECTED Final   Serratia marcescens NOT DETECTED NOT DETECTED Final   Haemophilus influenzae NOT DETECTED NOT DETECTED Final   Neisseria meningitidis NOT DETECTED NOT DETECTED Final   Pseudomonas aeruginosa NOT DETECTED NOT DETECTED Final   Candida albicans NOT DETECTED NOT DETECTED Final   Candida glabrata NOT DETECTED NOT DETECTED Final   Candida krusei NOT DETECTED NOT DETECTED Final   Candida parapsilosis NOT DETECTED NOT DETECTED Final   Candida tropicalis NOT DETECTED NOT DETECTED Final    Comment: Performed at [facility], [address] [place], [place] [address]  Culture, blood (Routine X 2) w Reflex to ID Panel     Status: Abnormal (Preliminary result)   Collection Time: 02/04/19  6:40 PM  Result Value Ref Range Status   Specimen Description BLOOD RIGHT HAND  Final   Special Requests   Final    BOTTLES DRAWN AEROBIC AND ANAEROBIC Blood Culture adequate volume   Culture  Setup Time   Final    GRAM POSITIVE COCCI IN CLUSTERS IN BOTH AEROBIC AND ANAEROBIC BOTTLES CRITICAL VALUE NOTED.  VALUE IS CONSISTENT WITH PREVIOUSLY REPORTED AND CALLED VALUE. Performed at [facility], [place] [address] [place], [place] [address]    Culture STAPHYLOCOCCUS AUREUS (A)  Final   Report Status PENDING  Incomplete  Aerobic Culture (superficial specimen)     Status: None   Collection Time: 02/04/19  6:44 PM  Result Value Ref Range Status    Specimen Description WOUND ARM  Final   Special Requests NONE  Final   Gram Stain   Final    RARE WBC PRESENT,BOTH PMN AND MONONUCLEAR RARE GRAM POSITIVE COCCI Performed at [facility], [place] [address] [place], [place] [address]    Culture   Final    MODERATE METHICILLIN RESISTANT STAPHYLOCOCCUS AUREUS   Report Status 02/06/2019 FINAL  Final   Organism ID, Bacteria METHICILLIN RESISTANT STAPHYLOCOCCUS AUREUS  Final      Susceptibility   Methicillin resistant staphylococcus aureus - MIC*  CIPROFLOXACIN <=0.5 SENSITIVE Sensitive     ERYTHROMYCIN <=0.25 SENSITIVE Sensitive     GENTAMICIN <=0.5 SENSITIVE Sensitive     OXACILLIN >=4 RESISTANT Resistant     TETRACYCLINE <=1 SENSITIVE Sensitive     VANCOMYCIN <=0.5 SENSITIVE Sensitive     TRIMETH/SULFA <=10 SENSITIVE Sensitive     CLINDAMYCIN <=0.25 SENSITIVE Sensitive     RIFAMPIN <=0.5 SENSITIVE Sensitive     Inducible Clindamycin NEGATIVE Sensitive     * MODERATE METHICILLIN RESISTANT STAPHYLOCOCCUS AUREUS  MRSA PCR Screening     Status: None   Collection Time: 02/04/19  6:56 PM  Result Value Ref Range Status   MRSA by PCR NEGATIVE NEGATIVE Final    Comment:        The GeneXpert MRSA Assay (FDA approved for NASAL specimens only), is one component of a comprehensive MRSA colonization surveillance program. It is not intended to diagnose MRSA infection nor to guide or monitor treatment for MRSA infections. Performed at [facility], [place] [address], [place], [place] [address]   Culture, blood (routine x 2)     Status: None (Preliminary result)   Collection Time: 02/06/19  7:13 AM  Result Value Ref Range Status   Specimen Description BLOOD LEFT ANTECUBITAL  Final   Special Requests   Final    BOTTLES DRAWN AEROBIC ONLY Blood Culture adequate volume   Culture   Final    NO GROWTH <12 HOURS Performed at [facility], [place] [address] [place], [place] [address]    Report Status PENDING  Incomplete          Radiology Studies: Dg Abd 1 View  Result Date: 02/04/2019 CLINICAL DATA:  Nausea and vomiting EXAM: ABDOMEN - 1 VIEW COMPARISON:  None. FINDINGS: Diffuse increased small and large bowel gas. Moderate stool in the colon. No radiopaque calculi. IMPRESSION: Mild diffuse increased bowel gas throughout, possible ileus. Electronically Signed   By: Donavan Foil M.D.   On: 02/04/2019 20:14   Dg Chest Port 1 View  Result Date: 02/04/2019 CLINICAL DATA:  Pneumonia EXAM: PORTABLE CHEST 1 VIEW COMPARISON:  None. FINDINGS: Right lung is clear. Borderline cardiomegaly. Patchy atelectasis or minimal infiltrate at the left base. Aortic atherosclerosis. IMPRESSION: Patchy atelectasis or minimal infiltrate at the left base Electronically Signed   By: Donavan Foil M.D.   On: 02/04/2019 20:13   [place] Rt Upper Extrem Ltd Soft Tissue Non Vascular  Result Date: 02/04/2019 CLINICAL DATA:  Possible abscess, right arm. Status post IV. Right antecubital redness. EXAM: ULTRASOUND RIGHT UPPER EXTREMITY LIMITED TECHNIQUE: Ultrasound examination of the upper extremity soft tissues was performed in the area of clinical concern. COMPARISON:  None. FINDINGS: There is thrombus in the basilic vein extending from the upper arm across the antecubital fossa, beginning just below the axillary vein confluence. There is associated diffuse soft tissue edema with some increased echogenicity of the subcutaneous fat consistent with inflammation. However, there is no abscess. No mass. IMPRESSION: 1. No abscess. 2. Acute thrombosis in the basilic vein with associated soft tissue edema and inflammation. Electronically Signed   By: Lajean Manes M.D.   On: 02/04/2019 20:25        Scheduled Meds: . apixaban  5 mg Oral BID  . atorvastatin  40 mg Oral q1800  . feeding supplement (GLUCERNA SHAKE)  237 mL Oral TID BM  . insulin aspart  0-9 Units Subcutaneous TID WC  . pantoprazole  40 mg Oral QHS   Continuous Infusions: .  vancomycin     . [START ON 02/07/2019] vancomycin       LOS: 7 days     Vernell Leep, MD, FACP, [facility]. [facility]  To contact the attending provider between 7A-7P or the covering provider during after hours 7P-7A, please log into the web site [email or web address] and access using universal [facility] password for that web site. If you do not have the password, please call the hospital operator.  02/06/2019, 4:44 PM

## 2019-02-06 NOTE — Progress Notes (Signed)
[facility] for Infectious Disease   Reason for visit: Follow up on bacteremia  Interval History: repeat cultures sent today; afebrile > 24 hours, WBC down to 12.5; feels better; now with DVT in upper arm and on Eliquis   Physical Exam: Constitutional:  Vitals:   02/06/19 0546 02/06/19 0815  BP: 100/68 123/73  Pulse: 89 93  Resp: 16   Temp: 98.9 F (37.2 C) 98.7 F (37.1 C)  SpO2: 97%    patient appears in NAD Eyes: anicteric Respiratory: Normal respiratory effort; CTA B Cardiovascular: RRR GI: soft, nt, nd  Review of Systems: Constitutional: negative for fevers and chills Gastrointestinal: negative for nausea and diarrhea Integument/breast: negative for rash  Lab Results  Component Value Date   WBC 12.5 (H) 02/06/2019   HGB 12.8 (L) 02/06/2019   HCT 38.6 (L) 02/06/2019   MCV 87.3 02/06/2019   PLT 360 02/06/2019    Lab Results  Component Value Date   CREATININE 0.86 02/06/2019   BUN 16 02/06/2019   NA 137 02/06/2019   K 3.6 02/06/2019   CL 108 02/06/2019   CO2 19 (L) 02/06/2019    Lab Results  Component Value Date   ALT 96 (H) 02/06/2019   AST 93 (H) 02/06/2019   ALKPHOS 238 (H) 02/06/2019     Microbiology: Recent Results (from the past 240 hour(s))  MRSA PCR Screening     Status: None   Collection Time: 01/30/19  6:00 PM  Result Value Ref Range Status   MRSA by PCR NEGATIVE NEGATIVE Final    Comment:        The GeneXpert MRSA Assay (FDA approved for NASAL specimens only), is one component of a comprehensive MRSA colonization surveillance program. It is not intended to diagnose MRSA infection nor to guide or monitor treatment for MRSA infections. Performed at [facility], [place] [address], [place], [place] [address]   Culture, blood (single)     Status: Abnormal (Preliminary result)   Collection Time: 02/04/19  5:30 PM  Result Value Ref Range Status   Specimen Description BLOOD RIGHT ARM  Final   Special Requests   Final   BOTTLES DRAWN AEROBIC AND ANAEROBIC Blood Culture adequate volume   Culture  Setup Time   Final    GRAM POSITIVE COCCI IN BOTH AEROBIC AND ANAEROBIC BOTTLES Organism ID to follow CRITICAL RESULT CALLED TO, READ BACK BY AND VERIFIED WITH: PHARMD M PHAM [phone] MLM    Culture (A)  Final    STAPHYLOCOCCUS AUREUS SUSCEPTIBILITIES TO FOLLOW Performed at [facility], [place] [address] [place], [place] [address]    Report Status PENDING  Incomplete  Blood Culture ID Panel (Reflexed)     Status: Abnormal   Collection Time: 02/04/19  5:30 PM  Result Value Ref Range Status   Enterococcus species NOT DETECTED NOT DETECTED Final   Listeria monocytogenes NOT DETECTED NOT DETECTED Final   Staphylococcus species DETECTED (A) NOT DETECTED Final    Comment: CRITICAL RESULT CALLED TO, READ BACK BY AND VERIFIED WITH: PHARMD M PHAM [phone] MLM    Staphylococcus aureus (BCID) DETECTED (A) NOT DETECTED Final    Comment: Methicillin (oxacillin) susceptible Staphylococcus aureus (MSSA). Preferred therapy is anti staphylococcal beta lactam antibiotic (Cefazolin or Nafcillin), unless clinically contraindicated. CRITICAL RESULT CALLED TO, READ BACK BY AND VERIFIED WITH: PHARMD M PHAM [phone] MLM    Methicillin resistance NOT DETECTED NOT DETECTED Final   Streptococcus species NOT DETECTED NOT  DETECTED Final   Streptococcus agalactiae NOT DETECTED NOT DETECTED Final   Streptococcus pneumoniae NOT DETECTED NOT DETECTED Final   Streptococcus pyogenes NOT DETECTED NOT DETECTED Final   Acinetobacter baumannii NOT DETECTED NOT DETECTED Final   Enterobacteriaceae species NOT DETECTED NOT DETECTED Final   Enterobacter cloacae complex NOT DETECTED NOT DETECTED Final   Escherichia coli NOT DETECTED NOT DETECTED Final   Klebsiella oxytoca NOT DETECTED NOT DETECTED Final   Klebsiella pneumoniae NOT DETECTED NOT DETECTED Final   Proteus species NOT DETECTED NOT DETECTED Final   Serratia marcescens  NOT DETECTED NOT DETECTED Final   Haemophilus influenzae NOT DETECTED NOT DETECTED Final   Neisseria meningitidis NOT DETECTED NOT DETECTED Final   Pseudomonas aeruginosa NOT DETECTED NOT DETECTED Final   Candida albicans NOT DETECTED NOT DETECTED Final   Candida glabrata NOT DETECTED NOT DETECTED Final   Candida krusei NOT DETECTED NOT DETECTED Final   Candida parapsilosis NOT DETECTED NOT DETECTED Final   Candida tropicalis NOT DETECTED NOT DETECTED Final    Comment: Performed at [facility], [place] [address], [place], [place] [address]  Culture, blood (Routine X 2) w Reflex to ID Panel     Status: Abnormal (Preliminary result)   Collection Time: 02/04/19  6:40 PM  Result Value Ref Range Status   Specimen Description BLOOD RIGHT HAND  Final   Special Requests   Final    BOTTLES DRAWN AEROBIC AND ANAEROBIC Blood Culture adequate volume   Culture  Setup Time   Final    GRAM POSITIVE COCCI IN CLUSTERS IN BOTH AEROBIC AND ANAEROBIC BOTTLES CRITICAL VALUE NOTED.  VALUE IS CONSISTENT WITH PREVIOUSLY REPORTED AND CALLED VALUE. Performed at [facility], [place] [address], [place], [place] [address]    Culture STAPHYLOCOCCUS AUREUS (A)  Final   Report Status PENDING  Incomplete  Aerobic Culture (superficial specimen)     Status: None   Collection Time: 02/04/19  6:44 PM  Result Value Ref Range Status   Specimen Description WOUND ARM  Final   Special Requests NONE  Final   Gram Stain   Final    RARE WBC PRESENT,BOTH PMN AND MONONUCLEAR RARE GRAM POSITIVE COCCI Performed at [facility], [place] [address] [place], [place] [address]    Culture   Final    MODERATE METHICILLIN RESISTANT STAPHYLOCOCCUS AUREUS   Report Status 02/06/2019 FINAL  Final   Organism ID, Bacteria METHICILLIN RESISTANT STAPHYLOCOCCUS AUREUS  Final      Susceptibility   Methicillin resistant staphylococcus aureus - MIC*    CIPROFLOXACIN <=0.5 SENSITIVE Sensitive     ERYTHROMYCIN <=0.25 SENSITIVE  Sensitive     GENTAMICIN <=0.5 SENSITIVE Sensitive     OXACILLIN >=4 RESISTANT Resistant     TETRACYCLINE <=1 SENSITIVE Sensitive     VANCOMYCIN <=0.5 SENSITIVE Sensitive     TRIMETH/SULFA <=10 SENSITIVE Sensitive     CLINDAMYCIN <=0.25 SENSITIVE Sensitive     RIFAMPIN <=0.5 SENSITIVE Sensitive     Inducible Clindamycin NEGATIVE Sensitive     * MODERATE METHICILLIN RESISTANT STAPHYLOCOCCUS AUREUS  MRSA PCR Screening     Status: None   Collection Time: 02/04/19  6:56 PM  Result Value Ref Range Status   MRSA by PCR NEGATIVE NEGATIVE Final    Comment:        The GeneXpert MRSA Assay (FDA approved for NASAL specimens only), is one component of a comprehensive MRSA colonization surveillance program. It is not intended to diagnose MRSA infection  nor to guide or monitor treatment for MRSA infections. Performed at [facility], [place] [address], [place], [place] [address]   Culture, blood (routine x 2)     Status: None (Preliminary result)   Collection Time: 02/06/19  7:13 AM  Result Value Ref Range Status   Specimen Description BLOOD LEFT ANTECUBITAL  Final   Special Requests   Final    BOTTLES DRAWN AEROBIC ONLY Blood Culture adequate volume   Culture   Final    NO GROWTH <12 HOURS Performed at [facility], [place] [address], [place], [place] [address]    Report Status PENDING  Incomplete    Impression/Plan:  1. Bacteremia - on nafcillin and repeat cultures sent.  Will continue to monitor. TEE recently negative.  2.  DVT - on Eliquis now; will need 3 months.   3.  Leukocytosis - improved.  Will continue to monitor

## 2019-02-07 DIAGNOSIS — E1159 Type 2 diabetes mellitus with other circulatory complications: Secondary | ICD-10-CM

## 2019-02-07 DIAGNOSIS — A4102 Sepsis due to Methicillin resistant Staphylococcus aureus: Secondary | ICD-10-CM

## 2019-02-07 DIAGNOSIS — E785 Hyperlipidemia, unspecified: Secondary | ICD-10-CM

## 2019-02-07 LAB — COMPREHENSIVE METABOLIC PANEL
ALT: 66 U/L — ABNORMAL HIGH (ref 0–44)
AST: 57 U/L — ABNORMAL HIGH (ref 15–41)
Albumin: 2 g/dL — ABNORMAL LOW (ref 3.5–5.0)
Alkaline Phosphatase: 197 U/L — ABNORMAL HIGH (ref 38–126)
Anion gap: 7 (ref 5–15)
BUN: 16 mg/dL (ref 8–23)
CALCIUM: 7.8 mg/dL — AB (ref 8.9–10.3)
CO2: 22 mmol/L (ref 22–32)
Chloride: 107 mmol/L (ref 98–111)
Creatinine, Ser: 0.58 mg/dL — ABNORMAL LOW (ref 0.61–1.24)
GFR calc Af Amer: >60 mL/min (ref >60)
Glucose, Bld: 151 mg/dL — ABNORMAL HIGH (ref 70–99)
Potassium: 3.3 mmol/L — ABNORMAL LOW (ref 3.5–5.1)
Sodium: 136 mmol/L (ref 135–145)
Total Bilirubin: 0.8 mg/dL (ref 0.3–1.2)
Total Protein: 4.7 g/dL — ABNORMAL LOW (ref 6.5–8.1)

## 2019-02-07 LAB — HEPATITIS C ANTIBODY: HCV Ab: <0.1 s/co ratio (ref 0.0–0.9)

## 2019-02-07 LAB — CBC
HCT: 33.8 % — ABNORMAL LOW (ref 39.0–52.0)
Hemoglobin: 11.5 g/dL — ABNORMAL LOW (ref 13.0–17.0)
MCH: 29.3 pg (ref 26.0–34.0)
MCHC: 34 g/dL (ref 30.0–36.0)
MCV: 86.2 fL (ref 80.0–100.0)
Platelets: 379 10*3/uL (ref 150–400)
RBC: 3.92 MIL/uL — ABNORMAL LOW (ref 4.22–5.81)
RDW: 13.2 % (ref 11.5–15.5)
WBC: 11.9 10*3/uL — ABNORMAL HIGH (ref 4.0–10.5)
nRBC: 0 % (ref 0.0–0.2)

## 2019-02-07 LAB — GLUCOSE, CAPILLARY
GLUCOSE-CAPILLARY: 114 mg/dL — AB (ref 70–99)
Glucose-Capillary: 124 mg/dL — ABNORMAL HIGH (ref 70–99)
Glucose-Capillary: 167 mg/dL — ABNORMAL HIGH (ref 70–99)
Glucose-Capillary: 134 mg/dL — ABNORMAL HIGH (ref 70–99)

## 2019-02-07 MED ORDER — POTASSIUM CHLORIDE CRYS ER 20 MEQ PO TBCR
20.0000 meq | EXTENDED_RELEASE_TABLET | Freq: Two times a day (BID) | ORAL | Status: AC
  Administered 2019-02-07 – 2019-02-09 (×4): 20 meq via ORAL
  Filled 2019-02-07 (×4): qty 1

## 2019-02-07 NOTE — Progress Notes (Signed)
STROKE TEAM NOTE   SUBJECTIVE Patient RN at bedside.  Patient lying in bed, awake, alert, interactive, eager to go home.  No complaints, no acute event overnight.  [name] still pending, continue vancomycin at this time.   OBJECTIVE Vitals:   02/07/19 0446 02/07/19 0724 02/07/19 1241 02/07/19 1551  BP:  119/78 (!) 147/80 126/75  Pulse: 97 96 99 94  Resp: 20 18 20    Temp:  98.7 F (37.1 C) 98.7 F (37.1 C) 98.5 F (36.9 C)  TempSrc:  Rectal Rectal Rectal  SpO2: 95% 96% 98% 98%  Weight:      Height:        Lipid Panel:     Component Value Date/Time   CHOL 120 01/31/2019 0513   TRIG 137 01/31/2019 0513   HDL 22 (L) 01/31/2019 0513   CHOLHDL 5.5 01/31/2019 0513   VLDL 27 01/31/2019 0513   LDLCALC 71 01/31/2019 0513   HgbA1c:  Lab Results  Component Value Date   HGBA1C 6.6 (H) 01/31/2019   IMAGING  CTA of H/N at [facility]  01/30/2019  No LVO but cervical carotid artery atherosclerosis, prominent soft plaque in the left carotid bulb, atherosclerosis with mild right and moderate left cavernous ICA stenosis and mod-severe right P1 stenosis. Moderate distal left vertebral artery stenosis.   CT HEAD WO CONTRAST  01/30/2019 Was negative for acute event at [facility], moderate small-vessel ischemic changes  Mr Brain Wo Contrast 01/31/2019 1. Small scattered foci of acute/early subacute infarction are present within the left posterosuperior frontal and parietal lobes inclusive of precentral gyrus. No hemorrhage or mass effect. 2. Moderate chronic microvascular ischemic changes and volume loss of the brain. 3. Paranasal sinus disease with fluid levels which may represent acute sinusitis. Partial bilateral mastoid opacification.   Carotid Doppler   02/01/2019 There is 1-39% bilateral ICA stenosis. Vertebral artery flow is antegrade.    Transthoracic Echocardiogram  1. The left ventricle has normal systolic function with an ejection fraction of 60-65%. The cavity size was  normal. Left ventricular diastolic Doppler parameters are consistent with impaired relaxation.  2. The right ventricle has normal systolic function. The cavity was normal. There is no increase in right ventricular wall thickness.  3. The mitral valve is normal in structure.  4. The tricuspid valve is normal in structure.  5. The aortic valve is tricuspid.  6. The pulmonic valve was normal in structure.  7. Normal LV systolic function; mild diastolic dysfunction.  8. The interatrial septum appears to be lipomatous.  TEE 02/03/2019 IMPRESSIONS  1. The left ventricle has normal systolic function, with an ejection fraction of 60-65%. The cavity size was normal.  2. The right ventricle has normal systolc function. The cavity was normal. There is no increase in right ventricular wall thickness.  3. The tricuspid valve was normal in structure.  4. The aortic valve is tricuspid Mild thickening of the aortic valve Mild calcification of the aortic valve. Aortic valve regurgitation is trivial by color flow Doppler.  5. The pulmonic valve was normal in structure.  6. The aortic root is normal in size and structure.   Blood Cultures 02/04/19 ; gram-positive cocci in clusters in both aerobic and anaerobic bottles staph aureus  Physical Exam    pleasant elderly   male not in distress. . Afebrile. Head is nontraumatic. Neck is supple without bruit.    Cardiac exam no murmur or gallop. Lungs are clear to auscultation. Distal pulses are well felt.  Right upper extremity  mildly swollen Neurological Exam ;  Awake alert oriented 3 with normal speech and language function. No aphasia, apraxia dysarthria. Extraocular moments are full range without nystagmus. Blinks to threat bilaterally. Fundi not visualized. Face is symmetric without weakness. Tongue midline. Motor system exam reveals left upper extremity 3/5 and left lower extremity 4+/5. DTRs are symmetric. Sensation is intact. Plantars are downgoing. Gait not  tested.  ASSESSMENT/PLAN [name] with history of diabetes, HTN, TIA, GIB, HLD, depression presenting with sudden right arm weakness.  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   CTA H&N - cervical carotid artery atherosclerosis, prominent soft plaque in the left carotid bulb, atherosclerosis with mild right and moderate left cavernous ICA stenosis and mod-severe right P1 stenosis. Moderate distal left vertebral artery stenosis.   Carotid Doppler left ICA 40 to 59% stenosis    TEE 02/03/2019 - EF 60 - 65%. No PFO  Loop recorder placed  LDL - 71  HgbA1c - 6.6  UDS - positive for opiates  VTE prophylaxis - SCDs  No antithrombotic prior to admission, now on Eliquis 5 mg twice daily due to right arm DVT.  Continue Eliquis for 3 months and then switched to aspirin 325 daily.  Therapy recommendations:  Home Health PT recommended  Disposition:  Pending  Left carotid stenosis  CTA head and neck showed left ICA prominent soft plaque at ICA bulb  Carotid Doppler left ICA 40 to 59% stenosis  Vascular surgery Dr. Donzetta Matters consulted  Decided no intervention needed at this time, will follow-up in office in 6 months with Dr. Donzetta Matters  Right arm basilic vein thrombosis  Right upper extremity Doppler confirmed  On Eliquis 5 mg twice daily  Likely provoked due to septic phlebitis  Continue AC for 3 months and then aspirin 325 daily  Bacteremia and right upper extremity cellulitis  Fever resolved  Leukocytosis improving, WBC 12.5-19.4-12.5  TEE 02/03/2019 no endocarditis  Right upper  extremity IV site pus culture shows gram-positive cocci  Blood cultures 02/04/2019 positive for staph and BCID shows MSSA  Blood culture 02/06/2019 - no growth so far - final pending  ID on board  On vancomycin now - pending sensitivity to change to po  Hypertension  Stable  . Long-term BP goal normotensive  Hyperlipidemia  Lipid lowering medication PTA:  none  LDL 71, goal < 70  Lipitor on hold due to elevated AST/ALT, which is improving  Diabetes  HgbA1c 6.6, at goal < 7.0  Controlled  SSI  CBG monitoring  Other Stroke Risk Factors  Advanced age  Other Active Problems  Recent flu like sxs - on Tamiflu - droplet precautions dc'd 3/4/ 2020  Elevated LFTs - continue to improve - recheck in am  Hypokalemia - 3.3 -> supplement - recheck in am  Hospital day # 8  Rosalin Hawking, MD PhD Stroke Neurology 02/07/2019 7:13 PM    To contact Stroke Continuity provider, please refer to [email or web address] After hours, contact General Neurology

## 2019-02-07 NOTE — Progress Notes (Signed)
PROGRESS NOTE   Jon Nelson  GEX:[id number]    DOB: 18-Aug-1946    DOA: 01/30/2019  PCP: Rochel Brome, MD   I have briefly reviewed patients previous medical records in [facility].  Brief Narrative:  73 year old male with PMH of DM 2, HTN, HLD, GI bleed, presented to OSH with sudden onset of right arm weakness, status post TPA 01/30/2019, transferred to [facility] for evaluation by stroke MD, stroke work-up including TEE has been completed and assessed to have cryptogenic stroke, overnight 3/5 developed fever and sepsis for which TRH was consulted. Evaluation revealed right upper extremity cellulitis with positive staph culture on wound and associated bacteremia.  MSSA on BCID.  ID consulted.  Also right upper extremity basilic vein DVT, now on Eliquis.   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 Sepsis due to RUE MRSA cellulitis with MSSA bacteremia (based on BCID): Sepsis developed 3/5.  He was treated per sepsis protocol with IV fluids, cultures were drawn and broad-spectrum antibiotics were initiated.  TEE 3/4 neg for vegetations.  Right upper extremity IV site pus culture confirms MRSA, BCID: MSSA, Blood cultures x2 from 3/5: Staph aureus, sensitivities pending.  Blood cultures x2 from 3/7: Negative to date. IV nafcillin changed to vancomycin 3/7.  Improved, afebrile, WBC down to 12.5.  Blood culture sensitivities still not back. 2. Right basilic vein DVT: Noted on right upper extremity ultrasound.  After confirming with stroke MD, Eliquis started 3/6.  Duration of anticoagulation: 3 months.  Elevate right upper extremity to reduce swelling.  Swelling improving. 3. Nausea and vomiting: Likely due to above.  Abdominal x-ray suggested possible ileus but no further nausea or vomiting reported today.  Resolved. 4. Acute embolic stroke: Management per primary  service/neurology.  Has completed extensive work-up.  Felt to be cryptogenic.  Was on aspirin 325 mg daily.  Since started Eliquis for DVT, I discussed with Dr. Erlinda Hong, Stroke MD on 3/7 who agrees with discontinuing aspirin while on Eliquis.  Resume aspirin 325 daily after Eliquis completed in 3 months. 5. Essential hypertension: Controlled. 6. Hyperlipidemia: Continue Lipitor. 7. DM2: A1c 6.6.  Controlled.  Continue SSI. 8. Recent flu: Completed course of Tamiflu. 9. Leukocytosis: Due to sepsis.  Improving. 10. NAGMA: Bicarbonate 15, anion gap 9 on 3/6.  Unclear etiology.  Lactate normal.  Bicarbonate up to 19, better.  Follow BMP. 11. Abnormal LFTs: Unclear etiology.  Transient nausea and vomiting yesterday.  LFTs have improved.  Continue to hold Lipitor for additional night tonight and if LFTs normalize, may consider resuming in a.m. 12. Left carotid stenosis: Left ICA 40-59% stenosis.  Vascular surgery consulted and recommended no intervention at this time and will follow-up in office in 6 months.   DVT prophylaxis: Eliquis started 3/6 Code Status: Full Family Communication: None at bedside Disposition: DC home pending clinical improvement.   Consultants:  TRH are consultants. ID  Procedures:  None  Antimicrobials:  IV nafcillin 3/6 >   Subjective: Overall states that he feels much better.  Right upper extremity pain and swelling have significantly improved.  Wants to get up and walk.  No cough or dyspnea reported.  ROS: As above, otherwise negative.  Objective:  Vitals:   02/07/19 0437 02/07/19 0446 02/07/19 0724 02/07/19 1241  BP:   119/78 (!) 147/80  Pulse: 97 97 96 99  Resp:  20 18 20   Temp:   98.7 F (37.1 C) 98.7 F (37.1 C)  TempSrc:   Rectal Rectal  SpO2: 95% 95% 96%   Weight:      Height:        Examination:  General exam: Pleasant elderly male, moderately built and nourished lying comfortably propped up in bed.  Does not appear in any  distress. Respiratory system: Clear to auscultation.  No increased work of breathing. Cardiovascular system: S1 & S2 heard, RRR. No JVD, murmurs, rubs, gallops or clicks. No pedal edema.  Telemetry personally reviewed: Sinus rhythm. Gastrointestinal system: Abdomen is nondistended, soft and nontender. No organomegaly or masses felt. Normal bowel sounds heard.  Stable. Central nervous system: Alert and oriented. No focal neurological deficits.  Stable. Extremities: Left upper extremity grade 3 x 5, left lower extremity grade 4 x 5, right extremities grade 5 x 5 power Skin: Left upper extremity swelling, medial upper arm in duration, warmth and erythema significantly improved. Psychiatry: Judgement and insight appear normal. Mood & affect flat.     Data Reviewed: I have personally reviewed following labs and imaging studies  CBC: Recent Labs  Lab 02/04/19 1838 02/05/19 0512 02/06/19 0713  WBC 12.5* 19.4* 12.5*  NEUTROABS 12.1* 18.4*  --   HGB 12.7* 13.2 12.8*  HCT 36.5* 38.9* 38.6*  MCV 84.9 85.5 87.3  PLT 334 391 124   Basic Metabolic Panel: Recent Labs  Lab 02/04/19 1838 02/05/19 0512 02/05/19 1838 02/06/19 0713  NA 129* 131* 136 137  K 4.0 4.3 4.1 3.6  CL 106 107 110 108  CO2 19* 15* 19* 19*  GLUCOSE 141* 161* 161* 142*  BUN 16 17 18 16   CREATININE 0.73 0.56* 0.86 0.86  CALCIUM 7.4* 7.5* 7.6* 7.9*   Liver Function Tests: Recent Labs  Lab 02/04/19 1838 02/06/19 0713  AST 213* 93*  ALT 146* 96*  ALKPHOS 278* 238*  BILITOT 1.1 1.9*  PROT 5.7* 5.2*  ALBUMIN 2.3* 2.0*   Coagulation Profile: Recent Labs  Lab 02/04/19 1838  INR 1.5*   CBG: Recent Labs  Lab 02/06/19 1224 02/06/19 1811 02/06/19 2154 02/07/19 0728 02/07/19 1239  GLUCAP 115* 125* 152* 124* 167*    Recent Results (from the past 240 hour(s))  MRSA PCR Screening     Status: None   Collection Time: 01/30/19  6:00 PM  Result Value Ref Range Status   MRSA by PCR NEGATIVE NEGATIVE Final     Comment:        The GeneXpert MRSA Assay (FDA approved for NASAL specimens only), is one component of a comprehensive MRSA colonization surveillance program. It is not intended to diagnose MRSA infection nor to guide or monitor treatment for MRSA infections. Performed at [facility], [place] [address] [place], [place] [address]   Culture, blood (single)     Status: Abnormal (Preliminary result)   Collection Time: 02/04/19  5:30 PM  Result Value Ref Range Status   Specimen Description BLOOD RIGHT ARM  Final   Special Requests   Final    BOTTLES DRAWN AEROBIC AND ANAEROBIC Blood Culture adequate volume   Culture  Setup Time   Final    GRAM POSITIVE COCCI IN BOTH AEROBIC AND ANAEROBIC BOTTLES Organism ID to follow CRITICAL RESULT CALLED TO, READ BACK BY AND VERIFIED WITH: PHARMD M PHAM [phone] MLM    Culture (A)  Final    STAPHYLOCOCCUS AUREUS REPEATING SUSCEPTIBILITIES TO FOLLOW Performed at [facility]  Lab, [address] [place], [place] [address]    Report Status PENDING  Incomplete  Blood Culture ID Panel (Reflexed)     Status: Abnormal   Collection Time: 02/04/19  5:30 PM  Result Value Ref Range Status   Enterococcus species NOT DETECTED NOT DETECTED Final   Listeria monocytogenes NOT DETECTED NOT DETECTED Final   Staphylococcus species DETECTED (A) NOT DETECTED Final    Comment: CRITICAL RESULT CALLED TO, READ BACK BY AND VERIFIED WITH: PHARMD M PHAM [phone] MLM    Staphylococcus aureus (BCID) DETECTED (A) NOT DETECTED Final    Comment: Methicillin (oxacillin) susceptible Staphylococcus aureus (MSSA). Preferred therapy is anti staphylococcal beta lactam antibiotic (Cefazolin or Nafcillin), unless clinically contraindicated. CRITICAL RESULT CALLED TO, READ BACK BY AND VERIFIED WITH: PHARMD M PHAM [phone] MLM    Methicillin resistance NOT DETECTED NOT DETECTED Final   Streptococcus species NOT DETECTED NOT DETECTED Final   Streptococcus agalactiae  NOT DETECTED NOT DETECTED Final   Streptococcus pneumoniae NOT DETECTED NOT DETECTED Final   Streptococcus pyogenes NOT DETECTED NOT DETECTED Final   Acinetobacter baumannii NOT DETECTED NOT DETECTED Final   Enterobacteriaceae species NOT DETECTED NOT DETECTED Final   Enterobacter cloacae complex NOT DETECTED NOT DETECTED Final   Escherichia coli NOT DETECTED NOT DETECTED Final   Klebsiella oxytoca NOT DETECTED NOT DETECTED Final   Klebsiella pneumoniae NOT DETECTED NOT DETECTED Final   Proteus species NOT DETECTED NOT DETECTED Final   Serratia marcescens NOT DETECTED NOT DETECTED Final   Haemophilus influenzae NOT DETECTED NOT DETECTED Final   Neisseria meningitidis NOT DETECTED NOT DETECTED Final   Pseudomonas aeruginosa NOT DETECTED NOT DETECTED Final   Candida albicans NOT DETECTED NOT DETECTED Final   Candida glabrata NOT DETECTED NOT DETECTED Final   Candida krusei NOT DETECTED NOT DETECTED Final   Candida parapsilosis NOT DETECTED NOT DETECTED Final   Candida tropicalis NOT DETECTED NOT DETECTED Final    Comment: Performed at [facility], [address], [place], [place] [address]  Culture, blood (Routine X 2) w Reflex to ID Panel     Status: Abnormal (Preliminary result)   Collection Time: 02/04/19  6:40 PM  Result Value Ref Range Status   Specimen Description BLOOD RIGHT HAND  Final   Special Requests   Final    BOTTLES DRAWN AEROBIC AND ANAEROBIC Blood Culture adequate volume   Culture  Setup Time   Final    GRAM POSITIVE COCCI IN CLUSTERS IN BOTH AEROBIC AND ANAEROBIC BOTTLES CRITICAL VALUE NOTED.  VALUE IS CONSISTENT WITH PREVIOUSLY REPORTED AND CALLED VALUE. Performed at [facility], [place] [address], [place], [place] [address]    Culture STAPHYLOCOCCUS AUREUS (A)  Final   Report Status PENDING  Incomplete  Aerobic Culture (superficial specimen)     Status: None   Collection Time: 02/04/19  6:44 PM  Result Value Ref Range Status   Specimen Description  WOUND ARM  Final   Special Requests NONE  Final   Gram Stain   Final    RARE WBC PRESENT,BOTH PMN AND MONONUCLEAR RARE GRAM POSITIVE COCCI Performed at [facility], [place] [address], [place], [place] [address]    Culture   Final    MODERATE METHICILLIN RESISTANT STAPHYLOCOCCUS AUREUS   Report Status 02/06/2019 FINAL  Final   Organism ID, Bacteria METHICILLIN RESISTANT STAPHYLOCOCCUS AUREUS  Final      Susceptibility   Methicillin resistant staphylococcus aureus - MIC*  CIPROFLOXACIN <=0.5 SENSITIVE Sensitive     ERYTHROMYCIN <=0.25 SENSITIVE Sensitive     GENTAMICIN <=0.5 SENSITIVE Sensitive     OXACILLIN >=4 RESISTANT Resistant     TETRACYCLINE <=1 SENSITIVE Sensitive     VANCOMYCIN <=0.5 SENSITIVE Sensitive     TRIMETH/SULFA <=10 SENSITIVE Sensitive     CLINDAMYCIN <=0.25 SENSITIVE Sensitive     RIFAMPIN <=0.5 SENSITIVE Sensitive     Inducible Clindamycin NEGATIVE Sensitive     * MODERATE METHICILLIN RESISTANT STAPHYLOCOCCUS AUREUS  MRSA PCR Screening     Status: None   Collection Time: 02/04/19  6:56 PM  Result Value Ref Range Status   MRSA by PCR NEGATIVE NEGATIVE Final    Comment:        The GeneXpert MRSA Assay (FDA approved for NASAL specimens only), is one component of a comprehensive MRSA colonization surveillance program. It is not intended to diagnose MRSA infection nor to guide or monitor treatment for MRSA infections. Performed at [facility], [place] [address] [place], [place] [address]   Culture, blood (routine x 2)     Status: None (Preliminary result)   Collection Time: 02/06/19  7:13 AM  Result Value Ref Range Status   Specimen Description BLOOD LEFT ANTECUBITAL  Final   Special Requests   Final    BOTTLES DRAWN AEROBIC ONLY Blood Culture adequate volume   Culture   Final    NO GROWTH < 24 HOURS Performed at [facility], [place] [address], [place], [place] [address]    Report Status PENDING  Incomplete  Culture, blood (routine x 2)      Status: None (Preliminary result)   Collection Time: 02/06/19 10:51 AM  Result Value Ref Range Status   Specimen Description BLOOD LEFT HAND  Final   Special Requests   Final    BOTTLES DRAWN AEROBIC ONLY Blood Culture adequate volume   Culture   Final    NO GROWTH < 24 HOURS Performed at [facility], [place] [address], [place], [place] [address]    Report Status PENDING  Incomplete         Radiology Studies: No results found.      Scheduled Meds: . apixaban  5 mg Oral BID  . feeding supplement (GLUCERNA SHAKE)  237 mL Oral TID BM  . insulin aspart  0-9 Units Subcutaneous TID WC  . pantoprazole  40 mg Oral QHS   Continuous Infusions: . vancomycin 1,000 mg (02/07/19 0655)     LOS: 8 days     Vernell Leep, MD, FACP, [facility]. [facility]  To contact the attending provider between 7A-7P or the covering provider during after hours 7P-7A, please log into the web site [email or web address] and access using universal Media password for that web site. If you do not have the password, please call the hospital operator.  02/07/2019, 2:31 PM

## 2019-02-07 NOTE — Discharge Instructions (Signed)
Implant site/wound care instructions Keep incision clean and dry for 3 days.  You can remove outer dressing tomorrow. Leave steri-strips (little pieces of tape) on until seen in the office for wound check appointment. Call the office [phone]) for redness, drainage, swelling, or fever.   Information on my medicine - ELIQUIS (apixaban)  This medication education was reviewed with me or my healthcare representative as part of my discharge preparation.   Why was Eliquis prescribed for you? Eliquis was prescribed to treat blood clots that may have been found in the veins of your legs (deep vein thrombosis) or in your lungs (pulmonary embolism) and to reduce the risk of them occurring again.  What do You need to know about Eliquis ? Take ONE 5 mg tablet taken TWICE daily.  Eliquis may be taken with or without food.   Try to take the dose about the same time in the morning and in the evening. If you have difficulty swallowing the tablet whole please discuss with your pharmacist how to take the medication safely.  Take Eliquis exactly as prescribed and DO NOT stop taking Eliquis without talking to the doctor who prescribed the medication.  Stopping may increase your risk of developing a new blood clot.  Refill your prescription before you run out.  After discharge, you should have regular check-up appointments with your healthcare provider that is prescribing your Eliquis.    What do you do if you miss a dose? If a dose of ELIQUIS is not taken at the scheduled time, take it as soon as possible on the same day and twice-daily administration should be resumed. The dose should not be doubled to make up for a missed dose.  Important Safety Information A possible side effect of Eliquis is bleeding. You should call your healthcare provider right away if you experience any of the following: ? Bleeding from an injury or your nose that does not stop. ? Unusual colored urine (red or dark brown)  or unusual colored stools (red or black). ? Unusual bruising for unknown reasons. ? A serious fall or if you hit your head (even if there is no bleeding).  Some medicines may interact with Eliquis and might increase your risk of bleeding or clotting while on Eliquis. To help avoid this, consult your healthcare provider or pharmacist prior to using any new prescription or non-prescription medications, including herbals, vitamins, non-steroidal anti-inflammatory drugs (NSAIDs) and supplements.  This website has more information on Eliquis (apixaban): http://www.eliquis.com/eliquis/home

## 2019-02-08 DIAGNOSIS — B9562 Methicillin resistant Staphylococcus aureus infection as the cause of diseases classified elsewhere: Secondary | ICD-10-CM

## 2019-02-08 LAB — COMPREHENSIVE METABOLIC PANEL
ALBUMIN: 1.9 g/dL — AB (ref 3.5–5.0)
ALT: 66 U/L — ABNORMAL HIGH (ref 0–44)
AST: 53 U/L — ABNORMAL HIGH (ref 15–41)
Alkaline Phosphatase: 204 U/L — ABNORMAL HIGH (ref 38–126)
Anion gap: 5 (ref 5–15)
BILIRUBIN TOTAL: 0.9 mg/dL (ref 0.3–1.2)
BUN: 11 mg/dL (ref 8–23)
CO2: 24 mmol/L (ref 22–32)
Calcium: 7.6 mg/dL — ABNORMAL LOW (ref 8.9–10.3)
Chloride: 106 mmol/L (ref 98–111)
Creatinine, Ser: 0.52 mg/dL — ABNORMAL LOW (ref 0.61–1.24)
GFR calc Af Amer: >60 mL/min (ref >60)
GFR calc non Af Amer: >60 mL/min (ref >60)
Glucose, Bld: 129 mg/dL — ABNORMAL HIGH (ref 70–99)
Potassium: 3.5 mmol/L (ref 3.5–5.1)
Sodium: 135 mmol/L (ref 135–145)
Total Protein: 4.9 g/dL — ABNORMAL LOW (ref 6.5–8.1)

## 2019-02-08 LAB — CBC
HCT: 33.4 % — ABNORMAL LOW (ref 39.0–52.0)
Hemoglobin: 11.2 g/dL — ABNORMAL LOW (ref 13.0–17.0)
MCH: 29.2 pg (ref 26.0–34.0)
MCHC: 33.5 g/dL (ref 30.0–36.0)
MCV: 87 fL (ref 80.0–100.0)
Platelets: 372 10*3/uL (ref 150–400)
RBC: 3.84 MIL/uL — ABNORMAL LOW (ref 4.22–5.81)
RDW: 13.4 % (ref 11.5–15.5)
WBC: 10.9 10*3/uL — ABNORMAL HIGH (ref 4.0–10.5)
nRBC: 0 % (ref 0.0–0.2)

## 2019-02-08 LAB — GLUCOSE, CAPILLARY
Glucose-Capillary: 122 mg/dL — ABNORMAL HIGH (ref 70–99)
Glucose-Capillary: 102 mg/dL — ABNORMAL HIGH (ref 70–99)
Glucose-Capillary: 117 mg/dL — ABNORMAL HIGH (ref 70–99)
Glucose-Capillary: 119 mg/dL — ABNORMAL HIGH (ref 70–99)

## 2019-02-08 LAB — CULTURE, BLOOD (SINGLE): Special Requests: ADEQUATE

## 2019-02-08 LAB — VANCOMYCIN, TROUGH: Vancomycin Tr: 9 ug/mL — ABNORMAL LOW (ref 15–20)

## 2019-02-08 LAB — CULTURE, BLOOD (ROUTINE X 2): Special Requests: ADEQUATE

## 2019-02-08 NOTE — Progress Notes (Signed)
IP rehab admissions - Patient is doing well with therapies and likely can DC home with [facility] therapies once he is medically ready.  I will sign off for CIR at this time.  Call me for questions.  [phone]

## 2019-02-08 NOTE — Care Management (Signed)
#  6.   S/W   ASHLEY  @  [employer] # [phone] OPT- 2   1. ELIQUIS  5 MG BID COVER-   NOT COVER / NON-FORMULARY PRIOR APPROVAL-  YES # [id number]   2. ELIQUIS  2.5 MG  BID COVER- YES CO-PAY- $ 47.00 TIER- 3 DRUG PRIOR APPROVAL- NO  DEDUCTIBLE : NOT MET  PREFERRED PHARMACY : YES [facility] AND [facility]

## 2019-02-08 NOTE — Progress Notes (Signed)
Physical Therapy Treatment Patient Details Name: Jon Nelson MRN: [id number] DOB: July 30, 1946 Today's Date: 02/08/2019    History of Present Illness Jon Nelson is an 73 y.o. male  With PMH diabetes, HTN, depression who presented to [facility] ED with c/o of sudden right arm weakness. Patient transferred to [facility] s/p TPA.  MRI showed small scattered foci of acute/early subacute infarction are present within the left posterosuperior frontal and parietal lobes inclusive of precentral gyrus. No hemorrhage or mass effect.    PT Comments    Pt presented in bed with water having soaked underneath pt bottom and in the floor as well. Pt cooling pad system had leaked and he required drying and assist with gown changes. Pt completed gait with min guard and no AD. Pt became fatigued when about half his previous gait distance and stated he thought we should head back to the room. Pt gait is slow and measured, but he is aware of limitations and did not require assistance to right any small drifts with gait. Pt tolerated therex interventions well. Pt eager to participate in therapy and current d/c to HHPT is appropriate based on progress. Plan to progress pt gait training and standing balance activities to improve functional mobility and increase independence.     Follow Up Recommendations  Home health PT     Equipment Recommendations  None recommended by PT    Recommendations for Other Services       Precautions / Restrictions Precautions Precautions: Fall Restrictions Weight Bearing Restrictions: No    Mobility  Bed Mobility Overal bed mobility: Needs Assistance Bed Mobility: Supine to Sit     Supine to sit: Min guard     General bed mobility comments: Required increased time and effort for bed mobilities.  Transfers Overall transfer level: Needs assistance Equipment used: None Transfers: Sit to/from Stand Sit to Stand: Min assist         General transfer comment: Pt grasping for HHA  to rise from EOB.   Ambulation/Gait Ambulation/Gait assistance: Min guard Gait Distance (Feet): 80 Feet Assistive device: None Gait Pattern/deviations: Step-through pattern     General Gait Details: pt mildly unsteady at times but able to self correct any gait disturbance. pt became tired about 40 feet from the room and decided he needed to turn. Pt aware of current limitations.    Stairs             Wheelchair Mobility    Modified Rankin (Stroke Patients Only)       Balance Overall balance assessment: Needs assistance   Sitting balance-Leahy Scale: Good     Standing balance support: No upper extremity supported;During functional activity Standing balance-Leahy Scale: Fair                              Cognition Arousal/Alertness: Awake/alert Behavior During Therapy: WFL for tasks assessed/performed Overall Cognitive Status: Within Functional Limits for tasks assessed                                        Exercises General Exercises - Lower Extremity Ankle Circles/Pumps: AROM;Both;10 reps;Supine Long Arc Quad: AROM;Seated;Both;10 reps Hip ABduction/ADduction: AROM;Both;Seated;15 reps    General Comments        Pertinent Vitals/Pain Pain Assessment: No/denies pain    Home Living  Prior Function            PT Goals (current goals can now be found in the care plan section) Acute Rehab PT Goals Patient Stated Goal: independent at home PT Goal Formulation: With patient Potential to Achieve Goals: Good    Frequency    Min 3X/week      PT Plan Current plan remains appropriate    Co-evaluation              AM-PAC PT "6 Clicks" Mobility   Outcome Measure  Help needed turning from your back to your side while in a flat bed without using bedrails?: A Little Help needed moving from lying on your back to sitting on the side of a flat bed without using bedrails?: A Little Help needed  moving to and from a bed to a chair (including a wheelchair)?: A Little Help needed standing up from a chair using your arms (e.g., wheelchair or bedside chair)?: A Little Help needed to walk in hospital room?: A Little Help needed climbing 3-5 steps with a railing? : A Little 6 Click Score: 18    End of Session Equipment Utilized During Treatment: Gait belt Activity Tolerance: Patient tolerated treatment well Patient left: in chair;with nursing/sitter in room;with call bell/phone within reach;with chair alarm set(nurse removing equipment and changing linens ) Nurse Communication: Mobility status PT Visit Diagnosis: Unsteadiness on feet (R26.81)     Time: 5056-9794 PT Time Calculation (min) (ACUTE ONLY): 20 min  Charges:  $Gait Training: 8-22 mins                     Maryelizabeth Kaufmann, SPTA   Maryelizabeth Kaufmann 02/08/2019, 2:26 PM

## 2019-02-08 NOTE — Progress Notes (Signed)
STROKE TEAM NOTE   SUBJECTIVE Patient sitting in chair, no family at bedside.  No complaints, no acute event overnight.  Blood culture sensitivity come back, showed MRSA.  ID on board, consider PICC line tomorrow with vancomycin IV for 14 days.  Pending surveillance blood culture result tomorrow.  OBJECTIVE Vitals:   02/07/19 1629 02/07/19 2018 02/07/19 2328 02/08/19 0455  BP: 126/72 118/72 124/67 123/82  Pulse: 93 94 95 95  Resp: 20 18 16 18   Temp: 98.7 F (37.1 C) 98.4 F (36.9 C) 98.6 F (37 C) 97.7 F (36.5 C)  TempSrc: Rectal Oral Oral Oral  SpO2: 98% 98% 96% 96%  Weight:      Height:        Lipid Panel:     Component Value Date/Time   CHOL 120 01/31/2019 0513   TRIG 137 01/31/2019 0513   HDL 22 (L) 01/31/2019 0513   CHOLHDL 5.5 01/31/2019 0513   VLDL 27 01/31/2019 0513   LDLCALC 71 01/31/2019 0513   HgbA1c:  Lab Results  Component Value Date   HGBA1C 6.6 (H) 01/31/2019   IMAGING  CTA of H/N at [facility]  01/30/2019  No LVO but cervical carotid artery atherosclerosis, prominent soft plaque in the left carotid bulb, atherosclerosis with mild right and moderate left cavernous ICA stenosis and mod-severe right P1 stenosis. Moderate distal left vertebral artery stenosis.   CT HEAD WO CONTRAST  01/30/2019 Was negative for acute event at [facility], moderate small-vessel ischemic changes  Mr Brain Wo Contrast 01/31/2019 1. Small scattered foci of acute/early subacute infarction are present within the left posterosuperior frontal and parietal lobes inclusive of precentral gyrus. No hemorrhage or mass effect. 2. Moderate chronic microvascular ischemic changes and volume loss of the brain. 3. Paranasal sinus disease with fluid levels which may represent acute sinusitis. Partial bilateral mastoid opacification.   Carotid Doppler   02/01/2019 There is 1-39% bilateral ICA stenosis. Vertebral artery flow is antegrade.    Transthoracic Echocardiogram  1. The left ventricle has  normal systolic function with an ejection fraction of 60-65%. The cavity size was normal. Left ventricular diastolic Doppler parameters are consistent with impaired relaxation.  2. The right ventricle has normal systolic function. The cavity was normal. There is no increase in right ventricular wall thickness.  3. The mitral valve is normal in structure.  4. The tricuspid valve is normal in structure.  5. The aortic valve is tricuspid.  6. The pulmonic valve was normal in structure.  7. Normal LV systolic function; mild diastolic dysfunction.  8. The interatrial septum appears to be lipomatous.  TEE 02/03/2019 IMPRESSIONS  1. The left ventricle has normal systolic function, with an ejection fraction of 60-65%. The cavity size was normal.  2. The right ventricle has normal systolc function. The cavity was normal. There is no increase in right ventricular wall thickness.  3. The tricuspid valve was normal in structure.  4. The aortic valve is tricuspid Mild thickening of the aortic valve Mild calcification of the aortic valve. Aortic valve regurgitation is trivial by color flow Doppler.  5. The pulmonic valve was normal in structure.  6. The aortic root is normal in size and structure.   Blood Cultures 02/04/19 ; gram-positive cocci in clusters in both aerobic and anaerobic bottles staph aureus  Physical Exam    pleasant elderly   male not in distress. . Afebrile. Head is nontraumatic. Neck is supple without bruit.    Cardiac exam no murmur or gallop. Lungs are  clear to auscultation. Distal pulses are well felt.  Right upper extremity mildly swollen Neurological Exam ;  Awake alert oriented 3 with normal speech and language function. No aphasia, apraxia dysarthria. Extraocular moments are full range without nystagmus. Blinks to threat bilaterally. Fundi not visualized. Face is symmetric without weakness. Tongue midline. Motor system exam reveals left upper extremity 3/5 and left lower extremity  4+/5. DTRs are symmetric. Sensation is intact. Plantars are downgoing. Gait not tested.  ASSESSMENT/PLAN [name] with history of diabetes, HTN, TIA, GIB, HLD, depression presenting with sudden right arm weakness.  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   CTA H&N - cervical carotid artery atherosclerosis, prominent soft plaque in the left carotid bulb, atherosclerosis with mild right and moderate left cavernous ICA stenosis and mod-severe right P1 stenosis. Moderate distal left vertebral artery stenosis.   Carotid Doppler left ICA 40 to 59% stenosis    TEE 02/03/2019 - EF 60 - 65%. No PFO  Loop recorder placed  LDL - 71  HgbA1c - 6.6  UDS - positive for opiates  VTE prophylaxis - SCDs  No antithrombotic prior to admission, now on Eliquis 5 mg twice daily due to right arm DVT.  Continue Eliquis for 3 months and then switched to aspirin 325 daily.  Therapy recommendations:  Home Health PT recommended  Disposition:  Pending  Left carotid stenosis  CTA head and neck showed left ICA prominent soft plaque at ICA bulb  Carotid Doppler left ICA 40 to 59% stenosis  Vascular surgery Dr. Donzetta Matters consulted  Decided no intervention needed at this time, will follow-up in office in 6 months with Dr. Donzetta Matters  Right arm basilic vein thrombosis  Right upper extremity Doppler confirmed  On Eliquis 5 mg twice daily  Likely provoked due to septic phlebitis  Continue AC for 3 months and then aspirin 325 daily  Bacteremia and right upper extremity cellulitis  Fever  resolved  Leukocytosis improving, WBC 12.5-19.4-12.5  TEE 02/03/2019 no endocarditis  Right upper extremity IV site pus culture shows gram-positive cocci  Blood cultures 02/04/2019 positive for MRSA  Blood culture 02/06/2019 - no growth so far   ID on board, recommend PICC line tomorrow with home IV vancomycin for 14 days  Continue IV vancomycin   Hypertension  Stable  . Long-term BP goal normotensive  Hyperlipidemia  Lipid lowering medication PTA:  none  LDL 71, goal < 70  Lipitor on hold due to elevated AST/ALT, which is improving  Diabetes  HgbA1c 6.6, at goal < 7.0  Controlled  SSI  CBG monitoring  Other Stroke Risk Factors  Advanced age  Other Active Problems  Recent flu like sxs - on Tamiflu - droplet precautions dc'd 3/4/ 2020  Elevated LFTs - continue to improve  Hypokalemia - 3.3 -> supplement -> 3.5   Hospital day # 9  Rosalin Hawking, MD PhD Stroke Neurology 02/08/2019 7:14 PM    To contact Stroke Continuity provider, please refer to [email or web address] After hours, contact General Neurology

## 2019-02-08 NOTE — Progress Notes (Signed)
Occupational Therapy Treatment Patient Details Name: Jon Nelson MRN: [id number] DOB: 06-Dec-1945 Today's Date: 02/08/2019    History of present illness Jon Nelson is an 73 y.o. male  With PMH diabetes, HTN, depression who presented to [facility] ED with c/o of sudden right arm weakness. Patient transferred to [facility] s/p TPA.  MRI showed small scattered foci of acute/early subacute infarction are present within the left posterosuperior frontal and parietal lobes inclusive of precentral gyrus. No hemorrhage or mass effect.   OT comments  Pt seated in recliner and eager to participate in OT session. Engaged in B UE exercises, cueing for coordination and control of R UE, noted increased AROM/strength in distal UE--wrist and hand.  Requires min guard for LB ADLs (donning socks seated using figure 4 technique given increased time, cueing to incorporate R UE into task), toileting and basic transfers.  Min guard to min assist for transfers during functional bathroom tasks, as patient has 2 LOB posteriorly when turning to sit/stand from commode.  Able to complete 3 step ADL task with minimal cueing to recall final step.  Plans to dc home with 24/7 support of family (sister in law), updated dc recommendations to [facility] services.  Will follow.    Follow Up Recommendations  Supervision/Assistance - 24 hour;Home health OT    Equipment Recommendations  3 in 1 bedside commode    Recommendations for Other Services      Precautions / Restrictions Precautions Precautions: Fall Restrictions Weight Bearing Restrictions: No       Mobility Bed Mobility Overal bed mobility: Needs Assistance Bed Mobility: Supine to Sit     Supine to sit: Min guard     General bed mobility comments: OOB upon entry   Transfers Overall transfer level: Needs assistance Equipment used: None Transfers: Sit to/from Stand Sit to Stand: Min guard         General transfer comment: min guard for safety and balance      Balance Overall balance assessment: Needs assistance   Sitting balance-Leahy Scale: Good     Standing balance support: No upper extremity supported;During functional activity Standing balance-Leahy Scale: Fair Standing balance comment: min assist at times due to LOB when turning and engaging in self care tasks                            ADL either performed or assessed with clinical judgement   ADL Overall ADL's : Needs assistance/impaired     Grooming: Standing;Supervision/safety;Wash/dry hands;Oral care Grooming Details (indicate cue type and reason): standing with close supervision, increased time and effort to manage toothpaste but no assist required              Lower Body Dressing: Min guard;Sit to/from stand Lower Body Dressing Details (indicate cue type and reason): donned B socks using figure 4 technique with supervision given increased time, sit to stand with min guard  Toilet Transfer: Ambulation;Minimal Buyer, retail Details (indicate cue type and reason): pt requires min assist during toilet transfer due to LOB during turing to sit on commode with urgency  Toileting- Clothing Manipulation and Hygiene: Minimal assistance;Sit to/from stand Toileting - Clothing Manipulation Details (indicate cue type and reason): +BM, requires min assist when standing from commode during toileting as looses balance posteriorly      Functional mobility during ADLs: Min guard;Supervision/safety General ADL Comments: supervision to min guard for safety throughout session, 2 LOB when turning during self care tasks; reports  plan to dc to sister in laws home where he will have 24/7 support     Vision   Vision Assessment?: No apparent visual deficits   Perception     Praxis      Cognition Arousal/Alertness: Awake/alert Behavior During Therapy: WFL for tasks assessed/performed Overall Cognitive Status: Impaired/Different from baseline Area of  Impairment: Memory;Attention;Following commands;Problem solving;Awareness;Safety/judgement                   Current Attention Level: Alternating Memory: Decreased short-term memory;Decreased recall of precautions Following Commands: Follows multi-step commands with increased time Safety/Judgement: Decreased awareness of safety;Decreased awareness of deficits Awareness: Emergent Problem Solving: Requires verbal cues;Slow processing;Difficulty sequencing General Comments: pt able to follow 3 step command today with minimal cueing, continues to have slow processing but much improved awareness to deficits and safety         Exercises General Exercises - Upper Extremity Shoulder Flexion: AROM;10 reps;Seated;Both Shoulder Horizontal ABduction: AROM;10 reps;Seated;Both Shoulder Horizontal ADduction: AROM;10 reps;Seated;Both Elbow Flexion: AROM;Both;10 reps;Seated Elbow Extension: AROM;Both;10 reps;Seated Wrist Flexion: AROM;10 reps;Seated;Both Wrist Extension: AROM;Right;10 reps;Seated Digit Composite Flexion: AROM;Right;10 reps;Seated Composite Extension: AROM;Right;10 reps;Seated General Exercises - Lower Extremity Ankle Circles/Pumps: AROM;Both;10 reps;Supine Long Arc Quad: AROM;Seated;Both;10 reps Hip ABduction/ADduction: AROM;Both;Seated;15 reps   Shoulder Instructions       General Comments      Pertinent Vitals/ Pain       Pain Assessment: No/denies pain  Home Living                                          Prior Functioning/Environment              Frequency  Min 2X/week        Progress Toward Goals  OT Goals(current goals can now be found in the care plan section)  Progress towards OT goals: Progressing toward goals  Acute Rehab OT Goals Patient Stated Goal: independent at home OT Goal Formulation: With patient Time For Goal Achievement: 02/15/19 Potential to Achieve Goals: Good  Plan Discharge plan needs to be  updated;Frequency remains appropriate    Co-evaluation                 AM-PAC OT "6 Clicks" Daily Activity     Outcome Measure   Help from another person eating meals?: None Help from another person taking care of personal grooming?: A Little Help from another person toileting, which includes using toliet, bedpan, or urinal?: A Little Help from another person bathing (including washing, rinsing, drying)?: A Little Help from another person to put on and taking off regular upper body clothing?: A Little Help from another person to put on and taking off regular lower body clothing?: A Little 6 Click Score: 19    End of Session    OT Visit Diagnosis: Other abnormalities of gait and mobility (R26.89);Hemiplegia and hemiparesis;Muscle weakness (generalized) (M62.81);Other symptoms and signs involving cognitive function Hemiplegia - Right/Left: Right Hemiplegia - dominant/non-dominant: Non-Dominant Hemiplegia - caused by: Cerebral infarction   Activity Tolerance Patient tolerated treatment well   Patient Left in chair;with call bell/phone within reach;Other (comment)   Nurse Communication Mobility status        Time: 9381-0175 OT Time Calculation (min): 30 min  Charges: OT General Charges $OT Visit: 1 Visit OT Treatments $Self Care/Home Management : 23-37 mins  Delight Stare, [place] Pager [phone] Office  [place] 02/08/2019, 5:16 PM

## 2019-02-08 NOTE — Progress Notes (Signed)
PROGRESS NOTE   Jon Nelson  EZM:[id number]    DOB: 1946-02-13    DOA: 01/30/2019  PCP: Rochel Brome, MD   I have briefly reviewed patients previous medical records in [facility].  Brief Narrative:  73 year old male with PMH of DM 2, HTN, HLD, GI bleed, presented to OSH with sudden onset of right arm weakness, status post TPA 01/30/2019, transferred to [facility] for evaluation by stroke MD, stroke work-up including TEE has been completed and assessed to have cryptogenic stroke, overnight 3/5 developed fever and sepsis for which TRH was consulted. Evaluation revealed right upper extremity cellulitis with positive staph culture on wound and associated bacteremia.  MSSA on BCID.  ID consulted.  Also right upper extremity basilic vein DVT, now on Eliquis.   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 Sepsis due to MRSA RUE cellulitis with bacteremia: Sepsis developed 3/5.  He was treated per sepsis protocol with IV fluids, cultures were drawn and broad-spectrum antibiotics were initiated.  TEE 3/4 neg for vegetations.  Right upper extremity IV site pus culture confirms MRSA, BCID: MSSA, Blood cultures x2 from 3/5: Now shows MRSA.  Blood cultures x2 from 3/7: Negative to date. IV nafcillin changed to vancomycin 3/7.  Sepsis resolved.  As per ID follow-up, okay for PICC line 3/10 if repeat blood cultures remain negative, treat with vancomycin for 14 days total from negative blood culture 3/7 through 3/20, ID follow-up arranged 3/19 at 11:30 AM. 2. Right basilic vein DVT: Noted on right upper extremity ultrasound.  After confirming with stroke MD, Eliquis started 3/6.  Duration of anticoagulation: 3 months.  Elevate right upper extremity to reduce swelling.  Swelling continues to improve. 3. Nausea and vomiting: Likely due to above.  Abdominal x-ray suggested possible  ileus but no further nausea or vomiting reported today.  Resolved. 4. Acute embolic stroke: Management per primary service/neurology.  Has completed extensive work-up.  Felt to be cryptogenic.  Was on aspirin 325 mg daily.  Since started Eliquis for DVT, I discussed with Dr. Erlinda Hong, Stroke MD on 3/7 who agrees with discontinuing aspirin while on Eliquis.  Resume aspirin 325 daily after Eliquis completed in 3 months. 5. Essential hypertension: Controlled. 6. Hyperlipidemia: Continue Lipitor. 7. DM2: A1c 6.6.  Controlled.  Continue SSI. 8. Recent flu: Completed course of Tamiflu. 9. Leukocytosis: Due to sepsis.  Improving. 10. NAGMA: Bicarbonate 15, anion gap 9 on 3/6.  Unclear etiology.  Lactate normal.  Resolved. 11. Abnormal LFTs: Unclear etiology. LFTs have improved (AST 53 and ALT 66).  Monitor LFTs closely while on Lipitor. 12. Left carotid stenosis: Left ICA 40-59% stenosis.  Vascular surgery consulted and recommended no intervention at this time and will follow-up in office in 6 months. 13. Hypokalemia: Replaced.   DVT prophylaxis: Eliquis started 3/6 Code Status: Full Family Communication: None at bedside Disposition: DC home pending clinical improvement, possibly 3/10.   Consultants:  TRH are consultants. ID  Procedures:  None  Antimicrobials:  IV nafcillin 3/6 >   Subjective: Denies complaints.  States that he progressively continues to do better.  ROS: As above, otherwise negative.  Objective:  Vitals:   02/07/19 2328 02/08/19 0455 02/08/19 0841 02/08/19 1218  BP: 124/67 123/82 123/83 124/73  Pulse: 95 95 93 89  Resp: 16 18 20 18   Temp: 98.6 F (37 C)  97.7 F (36.5 C) 98.7 F (37.1 C) 97.7 F (36.5 C)  TempSrc: Oral Oral Rectal Oral  SpO2: 96% 96% 98% 99%  Weight:      Height:        Examination:  General exam: Pleasant elderly male, moderately built and nourished lying comfortably propped up in bed.  Does not appear in any distress. Respiratory system:  Clear to auscultation.  No increased work of breathing. Cardiovascular system: S1 & S2 heard, RRR. No JVD, murmurs, rubs, gallops or clicks. No pedal edema.  Telemetry personally reviewed: Sinus rhythm. Gastrointestinal system: Abdomen is nondistended, soft and nontender. No organomegaly or masses felt. Normal bowel sounds heard.  Stable. Central nervous system: Alert and oriented. No focal neurological deficits.  Stable. Extremities: Left upper extremity grade 3 x 5, left lower extremity grade 4 x 5, right extremities grade 5 x 5 power Skin: Apart from mild induration over medial aspect of left cubital fossa, left upper extremity exam has no acute findings. Psychiatry: Judgement and insight appear normal. Mood & affect flat.     Data Reviewed: I have personally reviewed following labs and imaging studies  CBC: Recent Labs  Lab 02/04/19 1838 02/05/19 0512 02/06/19 0713 02/07/19 1529 02/08/19 0555  WBC 12.5* 19.4* 12.5* 11.9* 10.9*  NEUTROABS 12.1* 18.4*  --   --   --   HGB 12.7* 13.2 12.8* 11.5* 11.2*  HCT 36.5* 38.9* 38.6* 33.8* 33.4*  MCV 84.9 85.5 87.3 86.2 87.0  PLT 334 391 360 379 353   Basic Metabolic Panel: Recent Labs  Lab 02/05/19 0512 02/05/19 1838 02/06/19 0713 02/07/19 1529 02/08/19 0555  NA 131* 136 137 136 135  K 4.3 4.1 3.6 3.3* 3.5  CL 107 110 108 107 106  CO2 15* 19* 19* 22 24  GLUCOSE 161* 161* 142* 151* 129*  BUN 17 18 16 16 11   CREATININE 0.56* 0.86 0.86 0.58* 0.52*  CALCIUM 7.5* 7.6* 7.9* 7.8* 7.6*   Liver Function Tests: Recent Labs  Lab 02/04/19 1838 02/06/19 0713 02/07/19 1529 02/08/19 0555  AST 213* 93* 57* 53*  ALT 146* 96* 66* 66*  ALKPHOS 278* 238* 197* 204*  BILITOT 1.1 1.9* 0.8 0.9  PROT 5.7* 5.2* 4.7* 4.9*  ALBUMIN 2.3* 2.0* 2.0* 1.9*   Coagulation Profile: Recent Labs  Lab 02/04/19 1838  INR 1.5*   CBG: Recent Labs  Lab 02/07/19 1239 02/07/19 1758 02/07/19 2139 02/08/19 0838 02/08/19 1213  GLUCAP 167* 114* 134*  122* 102*    Recent Results (from the past 240 hour(s))  MRSA PCR Screening     Status: None   Collection Time: 01/30/19  6:00 PM  Result Value Ref Range Status   MRSA by PCR NEGATIVE NEGATIVE Final    Comment:        The GeneXpert MRSA Assay (FDA approved for NASAL specimens only), is one component of a comprehensive MRSA colonization surveillance program. It is not intended to diagnose MRSA infection nor to guide or monitor treatment for MRSA infections. Performed at [facility], [place] [address], [place], [place] [address]   Culture, blood (single)     Status: Abnormal   Collection Time: 02/04/19  5:30 PM  Result Value Ref Range Status   Specimen Description BLOOD RIGHT ARM  Final   Special Requests   Final    BOTTLES DRAWN AEROBIC AND ANAEROBIC Blood Culture adequate volume   Culture  Setup Time   Final    GRAM POSITIVE COCCI IN BOTH AEROBIC  AND ANAEROBIC BOTTLES Organism ID to follow CRITICAL RESULT CALLED TO, READ BACK BY AND VERIFIED WITH: Ellin Mayhew [facility] [id number] MLM Performed at [facility], [place] [address] [place], [place] [address]    Culture METHICILLIN RESISTANT STAPHYLOCOCCUS AUREUS (A)  Final   Report Status 02/08/2019 FINAL  Final   Organism ID, Bacteria METHICILLIN RESISTANT STAPHYLOCOCCUS AUREUS  Final      Susceptibility   Methicillin resistant staphylococcus aureus - MIC*    CIPROFLOXACIN <=0.5 SENSITIVE Sensitive     ERYTHROMYCIN <=0.25 SENSITIVE Sensitive     GENTAMICIN <=0.5 SENSITIVE Sensitive     OXACILLIN >=4 RESISTANT Resistant     TETRACYCLINE <=1 SENSITIVE Sensitive     VANCOMYCIN <=0.5 SENSITIVE Sensitive     TRIMETH/SULFA <=10 SENSITIVE Sensitive     CLINDAMYCIN <=0.25 SENSITIVE Sensitive     RIFAMPIN <=0.5 SENSITIVE Sensitive     Inducible Clindamycin NEGATIVE Sensitive     * METHICILLIN RESISTANT STAPHYLOCOCCUS AUREUS  Blood Culture ID Panel (Reflexed)     Status: Abnormal   Collection Time: 02/04/19  5:30 PM  Result  Value Ref Range Status   Enterococcus species NOT DETECTED NOT DETECTED Final   Listeria monocytogenes NOT DETECTED NOT DETECTED Final   Staphylococcus species DETECTED (A) NOT DETECTED Final    Comment: CRITICAL RESULT CALLED TO, READ BACK BY AND VERIFIED WITH: PHARMD M PHAM [id number] MLM    Staphylococcus aureus (BCID) DETECTED (A) NOT DETECTED Final    Comment: Methicillin (oxacillin) susceptible Staphylococcus aureus (MSSA). Preferred therapy is anti staphylococcal beta lactam antibiotic (Cefazolin or Nafcillin), unless clinically contraindicated. CRITICAL RESULT CALLED TO, READ BACK BY AND VERIFIED WITH: PHARMD M PHAM [phone] MLM    Methicillin resistance NOT DETECTED NOT DETECTED Final   Streptococcus species NOT DETECTED NOT DETECTED Final   Streptococcus agalactiae NOT DETECTED NOT DETECTED Final   Streptococcus pneumoniae NOT DETECTED NOT DETECTED Final   Streptococcus pyogenes NOT DETECTED NOT DETECTED Final   Acinetobacter baumannii NOT DETECTED NOT DETECTED Final   Enterobacteriaceae species NOT DETECTED NOT DETECTED Final   Enterobacter cloacae complex NOT DETECTED NOT DETECTED Final   Escherichia coli NOT DETECTED NOT DETECTED Final   Klebsiella oxytoca NOT DETECTED NOT DETECTED Final   Klebsiella pneumoniae NOT DETECTED NOT DETECTED Final   Proteus species NOT DETECTED NOT DETECTED Final   Serratia marcescens NOT DETECTED NOT DETECTED Final   Haemophilus influenzae NOT DETECTED NOT DETECTED Final   Neisseria meningitidis NOT DETECTED NOT DETECTED Final   Pseudomonas aeruginosa NOT DETECTED NOT DETECTED Final   Candida albicans NOT DETECTED NOT DETECTED Final   Candida glabrata NOT DETECTED NOT DETECTED Final   Candida krusei NOT DETECTED NOT DETECTED Final   Candida parapsilosis NOT DETECTED NOT DETECTED Final   Candida tropicalis NOT DETECTED NOT DETECTED Final    Comment: Performed at [facility], [address] [place], [place] [address]  Culture,  blood (Routine X 2) w Reflex to ID Panel     Status: Abnormal   Collection Time: 02/04/19  6:40 PM  Result Value Ref Range Status   Specimen Description BLOOD RIGHT HAND  Final   Special Requests   Final    BOTTLES DRAWN AEROBIC AND ANAEROBIC Blood Culture adequate volume   Culture  Setup Time   Final    GRAM POSITIVE COCCI IN CLUSTERS IN BOTH AEROBIC AND ANAEROBIC BOTTLES CRITICAL VALUE NOTED.  VALUE IS CONSISTENT WITH PREVIOUSLY REPORTED AND CALLED VALUE.  Culture (A)  Final    STAPHYLOCOCCUS AUREUS SUSCEPTIBILITIES PERFORMED ON PREVIOUS CULTURE WITHIN THE LAST 5 DAYS. Performed at [facility], [place] [address], [place], [place] [address]    Report Status 02/08/2019 FINAL  Final  Aerobic Culture (superficial specimen)     Status: None   Collection Time: 02/04/19  6:44 PM  Result Value Ref Range Status   Specimen Description WOUND ARM  Final   Special Requests NONE  Final   Gram Stain   Final    RARE WBC PRESENT,BOTH PMN AND MONONUCLEAR RARE GRAM POSITIVE COCCI Performed at [facility], [place] [address] [place], [place] [address]    Culture   Final    MODERATE METHICILLIN RESISTANT STAPHYLOCOCCUS AUREUS   Report Status 02/06/2019 FINAL  Final   Organism ID, Bacteria METHICILLIN RESISTANT STAPHYLOCOCCUS AUREUS  Final      Susceptibility   Methicillin resistant staphylococcus aureus - MIC*    CIPROFLOXACIN <=0.5 SENSITIVE Sensitive     ERYTHROMYCIN <=0.25 SENSITIVE Sensitive     GENTAMICIN <=0.5 SENSITIVE Sensitive     OXACILLIN >=4 RESISTANT Resistant     TETRACYCLINE <=1 SENSITIVE Sensitive     VANCOMYCIN <=0.5 SENSITIVE Sensitive     TRIMETH/SULFA <=10 SENSITIVE Sensitive     CLINDAMYCIN <=0.25 SENSITIVE Sensitive     RIFAMPIN <=0.5 SENSITIVE Sensitive     Inducible Clindamycin NEGATIVE Sensitive     * MODERATE METHICILLIN RESISTANT STAPHYLOCOCCUS AUREUS  MRSA PCR Screening     Status: None   Collection Time: 02/04/19  6:56 PM  Result Value Ref Range  Status   MRSA by PCR NEGATIVE NEGATIVE Final    Comment:        The GeneXpert MRSA Assay (FDA approved for NASAL specimens only), is one component of a comprehensive MRSA colonization surveillance program. It is not intended to diagnose MRSA infection nor to guide or monitor treatment for MRSA infections. Performed at [facility], [place] [address], [place], [place] [address]   Culture, blood (routine x 2)     Status: None (Preliminary result)   Collection Time: 02/06/19  7:13 AM  Result Value Ref Range Status   Specimen Description BLOOD LEFT ANTECUBITAL  Final   Special Requests   Final    BOTTLES DRAWN AEROBIC ONLY Blood Culture adequate volume   Culture   Final    NO GROWTH 2 DAYS Performed at [facility], [place] [address] [place], [place] [address]    Report Status PENDING  Incomplete  Culture, blood (routine x 2)     Status: None (Preliminary result)   Collection Time: 02/06/19 10:51 AM  Result Value Ref Range Status   Specimen Description BLOOD LEFT HAND  Final   Special Requests   Final    BOTTLES DRAWN AEROBIC ONLY Blood Culture adequate volume   Culture   Final    NO GROWTH 2 DAYS Performed at [facility], [place] [address], [place], [place] [address]    Report Status PENDING  Incomplete         Radiology Studies: No results found.      Scheduled Meds: . apixaban  5 mg Oral BID  . feeding supplement (GLUCERNA SHAKE)  237 mL Oral TID BM  . insulin aspart  0-9 Units Subcutaneous TID WC  . pantoprazole  40 mg Oral QHS  . potassium chloride  20 mEq Oral BID   Continuous Infusions: . vancomycin Stopped (02/08/19 0900)  LOS: 9 days     Vernell Leep, MD, FACP, [facility]. [facility]  To contact the attending provider between 7A-7P or the covering provider during after hours 7P-7A, please log into the web site [email or web address] and access using universal [place] password for that web site. If you do not have the password, please  call the hospital operator.  02/08/2019, 3:58 PM

## 2019-02-08 NOTE — Progress Notes (Signed)
Jon Nelson for Infectious Disease   Reason for visit: Follow up on bacteremia  Interval History: repeat cultures sent today; remains afebrile, WBC down to 10.9; continues to feel better;  DVT in upper arm and on Eliquis; increased movement in his arm   Physical Exam: Constitutional:  Vitals:   02/08/19 0455 02/08/19 0841  BP: 123/82 123/83  Pulse: 95 93  Resp: 18 20  Temp: 97.7 F (36.5 C) 98.7 F (37.1 C)  SpO2: 96% 98%   patient appears in NAD Eyes: anicteric Respiratory: Normal respiratory effort; CTA B Cardiovascular: RRR GI: soft, nt, nd  Review of Systems: Constitutional: negative for fevers and chills Gastrointestinal: negative for nausea and diarrhea Integument/breast: negative for rash  Lab Results  Component Value Date   WBC 10.9 (H) 02/08/2019   HGB 11.2 (L) 02/08/2019   HCT 33.4 (L) 02/08/2019   MCV 87.0 02/08/2019   PLT 372 02/08/2019    Lab Results  Component Value Date   CREATININE 0.52 (L) 02/08/2019   BUN 11 02/08/2019   NA 135 02/08/2019   K 3.5 02/08/2019   CL 106 02/08/2019   CO2 24 02/08/2019    Lab Results  Component Value Date   ALT 66 (H) 02/08/2019   AST 53 (H) 02/08/2019   ALKPHOS 204 (H) 02/08/2019     Microbiology: Recent Results (from the past 240 hour(s))  MRSA PCR Screening     Status: None   Collection Time: 01/30/19  6:00 PM  Result Value Ref Range Status   MRSA by PCR NEGATIVE NEGATIVE Final    Comment:        The GeneXpert MRSA Assay (FDA approved for NASAL specimens only), is one component of a comprehensive MRSA colonization surveillance program. It is not intended to diagnose MRSA infection nor to guide or monitor treatment for MRSA infections. Performed at [facility], [place] [address], [place], [place] [address]   Culture, blood (single)     Status: Abnormal   Collection Time: 02/04/19  5:30 PM  Result Value Ref Range Status   Specimen Description BLOOD RIGHT ARM  Final   Special Requests    Final    BOTTLES DRAWN AEROBIC AND ANAEROBIC Blood Culture adequate volume   Culture  Setup Time   Final    GRAM POSITIVE COCCI IN BOTH AEROBIC AND ANAEROBIC BOTTLES Organism ID to follow CRITICAL RESULT CALLED TO, READ BACK BY AND VERIFIED WITH: Ellin Mayhew [facility] [id number] MLM Performed at [facility], [place] [address] [place], [place] [address]    Culture METHICILLIN RESISTANT STAPHYLOCOCCUS AUREUS (A)  Final   Report Status 02/08/2019 FINAL  Final   Organism ID, Bacteria METHICILLIN RESISTANT STAPHYLOCOCCUS AUREUS  Final      Susceptibility   Methicillin resistant staphylococcus aureus - MIC*    CIPROFLOXACIN <=0.5 SENSITIVE Sensitive     ERYTHROMYCIN <=0.25 SENSITIVE Sensitive     GENTAMICIN <=0.5 SENSITIVE Sensitive     OXACILLIN >=4 RESISTANT Resistant     TETRACYCLINE <=1 SENSITIVE Sensitive     VANCOMYCIN <=0.5 SENSITIVE Sensitive     TRIMETH/SULFA <=10 SENSITIVE Sensitive     CLINDAMYCIN <=0.25 SENSITIVE Sensitive     RIFAMPIN <=0.5 SENSITIVE Sensitive     Inducible Clindamycin NEGATIVE Sensitive     * METHICILLIN RESISTANT STAPHYLOCOCCUS AUREUS  Blood Culture ID Panel (Reflexed)     Status: Abnormal   Collection Time: 02/04/19  5:30 PM  Result Value Ref Range Status  Enterococcus species NOT DETECTED NOT DETECTED Final   Listeria monocytogenes NOT DETECTED NOT DETECTED Final   Staphylococcus species DETECTED (A) NOT DETECTED Final    Comment: CRITICAL RESULT CALLED TO, READ BACK BY AND VERIFIED WITH: PHARMD M PHAM [id number] MLM    Staphylococcus aureus (BCID) DETECTED (A) NOT DETECTED Final    Comment: Methicillin (oxacillin) susceptible Staphylococcus aureus (MSSA). Preferred therapy is anti staphylococcal beta lactam antibiotic (Cefazolin or Nafcillin), unless clinically contraindicated. CRITICAL RESULT CALLED TO, READ BACK BY AND VERIFIED WITH: PHARMD M PHAM [phone] MLM    Methicillin resistance NOT DETECTED NOT DETECTED Final   Streptococcus species  NOT DETECTED NOT DETECTED Final   Streptococcus agalactiae NOT DETECTED NOT DETECTED Final   Streptococcus pneumoniae NOT DETECTED NOT DETECTED Final   Streptococcus pyogenes NOT DETECTED NOT DETECTED Final   Acinetobacter baumannii NOT DETECTED NOT DETECTED Final   Enterobacteriaceae species NOT DETECTED NOT DETECTED Final   Enterobacter cloacae complex NOT DETECTED NOT DETECTED Final   Escherichia coli NOT DETECTED NOT DETECTED Final   Klebsiella oxytoca NOT DETECTED NOT DETECTED Final   Klebsiella pneumoniae NOT DETECTED NOT DETECTED Final   Proteus species NOT DETECTED NOT DETECTED Final   Serratia marcescens NOT DETECTED NOT DETECTED Final   Haemophilus influenzae NOT DETECTED NOT DETECTED Final   Neisseria meningitidis NOT DETECTED NOT DETECTED Final   Pseudomonas aeruginosa NOT DETECTED NOT DETECTED Final   Candida albicans NOT DETECTED NOT DETECTED Final   Candida glabrata NOT DETECTED NOT DETECTED Final   Candida krusei NOT DETECTED NOT DETECTED Final   Candida parapsilosis NOT DETECTED NOT DETECTED Final   Candida tropicalis NOT DETECTED NOT DETECTED Final    Comment: Performed at [facility], [address], [place], [place] [address]  Culture, blood (Routine X 2) w Reflex to ID Panel     Status: Abnormal   Collection Time: 02/04/19  6:40 PM  Result Value Ref Range Status   Specimen Description BLOOD RIGHT HAND  Final   Special Requests   Final    BOTTLES DRAWN AEROBIC AND ANAEROBIC Blood Culture adequate volume   Culture  Setup Time   Final    GRAM POSITIVE COCCI IN CLUSTERS IN BOTH AEROBIC AND ANAEROBIC BOTTLES CRITICAL VALUE NOTED.  VALUE IS CONSISTENT WITH PREVIOUSLY REPORTED AND CALLED VALUE.    Culture (A)  Final    STAPHYLOCOCCUS AUREUS SUSCEPTIBILITIES PERFORMED ON PREVIOUS CULTURE WITHIN THE LAST 5 DAYS. Performed at [facility], [place] [address], [place], [place] [address]    Report Status 02/08/2019 FINAL  Final  Aerobic Culture (superficial  specimen)     Status: None   Collection Time: 02/04/19  6:44 PM  Result Value Ref Range Status   Specimen Description WOUND ARM  Final   Special Requests NONE  Final   Gram Stain   Final    RARE WBC PRESENT,BOTH PMN AND MONONUCLEAR RARE GRAM POSITIVE COCCI Performed at [facility], [place] [address] [place], [place] [address]    Culture   Final    MODERATE METHICILLIN RESISTANT STAPHYLOCOCCUS AUREUS   Report Status 02/06/2019 FINAL  Final   Organism ID, Bacteria METHICILLIN RESISTANT STAPHYLOCOCCUS AUREUS  Final      Susceptibility   Methicillin resistant staphylococcus aureus - MIC*    CIPROFLOXACIN <=0.5 SENSITIVE Sensitive     ERYTHROMYCIN <=0.25 SENSITIVE Sensitive     GENTAMICIN <=0.5 SENSITIVE Sensitive     OXACILLIN >=4 RESISTANT Resistant  TETRACYCLINE <=1 SENSITIVE Sensitive     VANCOMYCIN <=0.5 SENSITIVE Sensitive     TRIMETH/SULFA <=10 SENSITIVE Sensitive     CLINDAMYCIN <=0.25 SENSITIVE Sensitive     RIFAMPIN <=0.5 SENSITIVE Sensitive     Inducible Clindamycin NEGATIVE Sensitive     * MODERATE METHICILLIN RESISTANT STAPHYLOCOCCUS AUREUS  MRSA PCR Screening     Status: None   Collection Time: 02/04/19  6:56 PM  Result Value Ref Range Status   MRSA by PCR NEGATIVE NEGATIVE Final    Comment:        The GeneXpert MRSA Assay (FDA approved for NASAL specimens only), is one component of a comprehensive MRSA colonization surveillance program. It is not intended to diagnose MRSA infection nor to guide or monitor treatment for MRSA infections. Performed at [facility], [place] [address] [place], [place] [address]   Culture, blood (routine x 2)     Status: None (Preliminary result)   Collection Time: 02/06/19  7:13 AM  Result Value Ref Range Status   Specimen Description BLOOD LEFT ANTECUBITAL  Final   Special Requests   Final    BOTTLES DRAWN AEROBIC ONLY Blood Culture adequate volume   Culture   Final    NO GROWTH 2 DAYS Performed at [facility], [place] [address] [place], [place] [address]    Report Status PENDING  Incomplete  Culture, blood (routine x 2)     Status: None (Preliminary result)   Collection Time: 02/06/19 10:51 AM  Result Value Ref Range Status   Specimen Description BLOOD LEFT HAND  Final   Special Requests   Final    BOTTLES DRAWN AEROBIC ONLY Blood Culture adequate volume   Culture   Final    NO GROWTH 2 DAYS Performed at [facility], [place] [address], [place], [place] [address]    Report Status PENDING  Incomplete    Impression/Plan:  1. Bacteremia - on vancomycin now with MRSA in blood cultures and wound culture.   Ok for picc line tomorrow if repeat blood cultures remain negative Would use vancomycin 14 days total from negative blood culture 3/7 through 3/20.   I will arrange follow up at that time in my office; scheduled for 3/19 11:30  2.  DVT - on Eliquis; will need 3 months.   3.  Leukocytosis - improved.  Will continue to monitor

## 2019-02-08 NOTE — Care Management (Signed)
CM called and spoke to prior approval department for [facility] for the Eliquis. They state he doesn't need prior approval and he should not have any issue obtaining the medication. Co pay should be around the same as the 2.5 mg dose.

## 2019-02-08 NOTE — Care Management Note (Signed)
Case Management Note  Patient Details  Name: Eligh Rybacki MRN: [id number] Date of Birth: 1946/05/15  Subjective/Objective:                    Action/Plan: [facility] unable to accept the referral.  CM called Butch Penny with [facility] to see if they can accept.  Pt will d/c to sisters home: [name]: [place] in [place]   [phone] CM awaiting decision and will follow.   Expected Discharge Date:                  Expected Discharge Plan:  [place]  In-House Referral:  NA  Discharge planning Services  CM Consult  Post Acute Care Choice:  Home Health Choice offered to:  Patient  DME Arranged:  N/A DME Agency:  NA  HH Arranged:  RN, Disease Management, PT, OT, Speech Therapy, Social Work [employer] Agency:     Status of Service:  In process, will continue to follow  If discussed at Long Length of Stay Meetings, dates discussed:    Additional Comments:  Pollie Friar, RN 02/08/2019, 4:17 PM

## 2019-02-08 NOTE — Progress Notes (Signed)
PHARMACY CONSULT NOTE FOR:  OUTPATIENT  PARENTERAL ANTIBIOTIC THERAPY (OPAT)  Indication: MRSA bacteremia secondary to line  Regimen: Vancomycin 1000 mg Q 12 hours  End date: 02/19/2019  IV antibiotic discharge orders are pended. To discharging provider:  please sign these orders via discharge navigator,  Select New Orders & click on the button choice - Manage This Unsigned Work.     Thank you for allowing pharmacy to be a part of this patient's care.  Jimmy Footman, PharmD, BCPS, [facility] Clinical Pharmacist Phone: [phone] 02/08/2019, 10:24 AM

## 2019-02-09 ENCOUNTER — Inpatient Hospital Stay: Payer: Self-pay

## 2019-02-09 DIAGNOSIS — E876 Hypokalemia: Secondary | ICD-10-CM

## 2019-02-09 DIAGNOSIS — I82621 Acute embolism and thrombosis of deep veins of right upper extremity: Secondary | ICD-10-CM

## 2019-02-09 LAB — COMPREHENSIVE METABOLIC PANEL
ALT: 63 U/L — ABNORMAL HIGH (ref 0–44)
AST: 44 U/L — ABNORMAL HIGH (ref 15–41)
Albumin: 1.9 g/dL — ABNORMAL LOW (ref 3.5–5.0)
Alkaline Phosphatase: 198 U/L — ABNORMAL HIGH (ref 38–126)
Anion gap: 6 (ref 5–15)
BUN: 10 mg/dL (ref 8–23)
CO2: 24 mmol/L (ref 22–32)
Calcium: 8 mg/dL — ABNORMAL LOW (ref 8.9–10.3)
Chloride: 105 mmol/L (ref 98–111)
Creatinine, Ser: 0.61 mg/dL (ref 0.61–1.24)
GFR calc non Af Amer: >60 mL/min (ref >60)
Glucose, Bld: 128 mg/dL — ABNORMAL HIGH (ref 70–99)
Potassium: 3.8 mmol/L (ref 3.5–5.1)
Sodium: 135 mmol/L (ref 135–145)
TOTAL PROTEIN: 5 g/dL — AB (ref 6.5–8.1)
Total Bilirubin: 0.7 mg/dL (ref 0.3–1.2)

## 2019-02-09 LAB — CBC
HEMATOCRIT: 33.2 % — AB (ref 39.0–52.0)
Hemoglobin: 11.1 g/dL — ABNORMAL LOW (ref 13.0–17.0)
MCH: 29.1 pg (ref 26.0–34.0)
MCHC: 33.4 g/dL (ref 30.0–36.0)
MCV: 87.1 fL (ref 80.0–100.0)
Platelets: 381 10*3/uL (ref 150–400)
RBC: 3.81 MIL/uL — ABNORMAL LOW (ref 4.22–5.81)
RDW: 13.2 % (ref 11.5–15.5)
WBC: 13 10*3/uL — ABNORMAL HIGH (ref 4.0–10.5)
nRBC: 0 % (ref 0.0–0.2)

## 2019-02-09 LAB — GLUCOSE, CAPILLARY
Glucose-Capillary: 127 mg/dL — ABNORMAL HIGH (ref 70–99)
Glucose-Capillary: 120 mg/dL — ABNORMAL HIGH (ref 70–99)

## 2019-02-09 LAB — VANCOMYCIN, PEAK: Vancomycin Pk: 16 ug/mL — ABNORMAL LOW (ref 30–40)

## 2019-02-09 MED ORDER — SODIUM CHLORIDE 0.9% FLUSH: 10.0000 mL | INTRAVENOUS | Status: DC | PRN

## 2019-02-09 MED ORDER — VANCOMYCIN HCL 10 G IV SOLR
1250.0000 mg | Freq: Two times a day (BID) | INTRAVENOUS | Status: DC
  Administered 2019-02-09 (×2): 1250 mg via INTRAVENOUS
  Filled 2019-02-09 (×3): qty 1250

## 2019-02-09 MED ORDER — SODIUM CHLORIDE 0.9% FLUSH: 10.0000 mL | Freq: Two times a day (BID) | INTRAVENOUS | Status: DC

## 2019-02-09 MED ORDER — VANCOMYCIN IV (FOR PTA / DISCHARGE USE ONLY): 1250.0000 mg | Freq: Two times a day (BID) | INTRAVENOUS | 0 refills | Status: AC

## 2019-02-09 MED ORDER — APIXABAN 5 MG PO TABS: 5.0000 mg | ORAL_TABLET | Freq: Two times a day (BID) | ORAL | 1 refills | Status: DC

## 2019-02-09 NOTE — Progress Notes (Signed)
PROGRESS NOTE   Jon Nelson  QQI:[id number]    DOB: 09/15/1946    DOA: 01/30/2019  PCP: Rochel Brome, MD   I have briefly reviewed patients previous medical records in [facility].  Brief Narrative:  73 year old male with PMH of DM 2, HTN, HLD, GI bleed, presented to OSH with sudden onset of right arm weakness, status post TPA 01/30/2019, transferred to [facility] for evaluation by stroke MD, stroke work-up including TEE has been completed and assessed to have cryptogenic stroke, overnight 3/5 developed fever and sepsis for which TRH was consulted.  Finally diagnosed with MRSA right upper extremity cellulitis with bacteremia.  ID consulted and patient will be discharged home on IV vancomycin via PICC line.  Also right upper extremity basilic vein DVT, now on Eliquis.  Patient plan for discharge home by primary service on 3/10.  TRH signed off 3/10.   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 Sepsis due to MRSA RUE cellulitis with bacteremia: Sepsis developed 3/5.  He was treated per sepsis protocol with IV fluids, cultures were drawn and broad-spectrum antibiotics were initiated.  TEE 3/4 neg for vegetations.  Right upper extremity IV site pus culture confirms MRSA, BCID: MSSA, Blood cultures x2 from 3/5: MRSA.  Blood cultures x2 from 3/7: Negative to date. IV nafcillin changed to vancomycin 3/7.  Sepsis resolved.  As per ID follow-up, okay for PICC line 3/10 if repeat blood cultures remain negative, treat with vancomycin for 14 days total from negative blood culture 3/7 through 3/20, ID follow-up arranged 3/19 at 11:30 AM. OPAT per pharmacy. 2. Right basilic vein DVT: Noted on right upper extremity ultrasound.  After confirming with stroke MD, Eliquis started 3/6.  Duration of anticoagulation: 3 months.  Elevate right upper extremity to reduce swelling.  Swelling  continues to improve. 3. Nausea and vomiting: Resolved. 4. Acute embolic stroke: Management per primary service/Neurology.  Has completed extensive work-up.  Felt to be cryptogenic.  Was on aspirin 325 mg daily.  Since started Eliquis for DVT, I discussed with Dr. Erlinda Hong, Stroke MD on 3/7 who agrees with discontinuing aspirin while on Eliquis.  Resume aspirin 325 daily after Eliquis completed in 3 months.  Stroke service plans to discharge patient home after PICC line placement today.  I discussed with stroke MD. 5. Essential hypertension: Controlled. 6. Hyperlipidemia: Continue Lipitor.  Monitor LFTs periodically while on statins. 7. DM2: A1c 6.6.  Controlled.  Resume home metformin at discharge. 8. Recent flu: Completed course of Tamiflu. 9. Anemia: Likely due to acute illness.  Stable. 10. Leukocytosis: Due to sepsis.  Improving. 11. NAGMA: Bicarbonate 15, anion gap 9 on 3/6.  Unclear etiology.  Lactate normal.  Resolved. 12. Abnormal LFTs: Unclear etiology. LFTs have improved (AST 44 and ALT 63).  Monitor LFTs closely while on Lipitor. 13. Left carotid stenosis: Left ICA 40-59% stenosis.  Vascular surgery consulted and recommended no intervention at this time and will follow-up in office in 6 months. 14. Hypokalemia: Replaced.   DVT prophylaxis: Eliquis started 3/6 Code Status: Full Family Communication: None at bedside Disposition: Neurology plans to discharge patient home 3/10.  TRH signed off 3/10.   Consultants:  TRH are consultants. ID  Procedures:  None  Antimicrobials:  IV nafcillin 3/6 > discontinued IV vancomycin   Subjective: No complaints reported.  No pain in  right upper extremity.  ROS: As above, otherwise negative.  Objective:  Vitals:   02/09/19 0308 02/09/19 0857 02/09/19 0858 02/09/19 1129  BP: 113/66 132/74 132/74 121/77  Pulse: 92 91 90 88  Resp: 18  18 18   Temp: 98 F (36.7 C) 97.8 F (36.6 C) 97.8 F (36.6 C)   TempSrc: Oral Oral Oral   SpO2: 96%  98% 98% 98%  Weight:      Height:        Examination:  General exam: Pleasant elderly male, moderately built and nourished lying comfortably propped up in bed.  Does not appear in any distress. Respiratory system: Clear to auscultation.  No increased work of breathing.  Stable Cardiovascular system: S1 & S2 heard, RRR. No JVD, murmurs, rubs, gallops or clicks. No pedal edema.  Stable Gastrointestinal system: Abdomen is nondistended, soft and nontender. No organomegaly or masses felt. Normal bowel sounds heard.  Stable. Central nervous system: Alert and oriented. No focal neurological deficits.  Stable. Extremities: Left upper extremity grade 3 x 5, left lower extremity grade 4 x 5, right extremities grade 5 x 5 power Skin: Apart from mild induration over medial aspect of right cubital fossa, R upper extremity exam has no acute findings. Psychiatry: Judgement and insight appear normal. Mood & affect pleasant and appropriate.     Data Reviewed: I have personally reviewed following labs and imaging studies  CBC: Recent Labs  Lab 02/04/19 1838 02/05/19 0512 02/06/19 0713 02/07/19 1529 02/08/19 0555 02/09/19 0406  WBC 12.5* 19.4* 12.5* 11.9* 10.9* 13.0*  NEUTROABS 12.1* 18.4*  --   --   --   --   HGB 12.7* 13.2 12.8* 11.5* 11.2* 11.1*  HCT 36.5* 38.9* 38.6* 33.8* 33.4* 33.2*  MCV 84.9 85.5 87.3 86.2 87.0 87.1  PLT 334 391 360 379 372 185   Basic Metabolic Panel: Recent Labs  Lab 02/05/19 1838 02/06/19 0713 02/07/19 1529 02/08/19 0555 02/09/19 0406  NA 136 137 136 135 135  K 4.1 3.6 3.3* 3.5 3.8  CL 110 108 107 106 105  CO2 19* 19* 22 24 24   GLUCOSE 161* 142* 151* 129* 128*  BUN 18 16 16 11 10   CREATININE 0.86 0.86 0.58* 0.52* 0.61  CALCIUM 7.6* 7.9* 7.8* 7.6* 8.0*   Liver Function Tests: Recent Labs  Lab 02/04/19 1838 02/06/19 0713 02/07/19 1529 02/08/19 0555 02/09/19 0406  AST 213* 93* 57* 53* 44*  ALT 146* 96* 66* 66* 63*  ALKPHOS 278* 238* 197* 204* 198*    BILITOT 1.1 1.9* 0.8 0.9 0.7  PROT 5.7* 5.2* 4.7* 4.9* 5.0*  ALBUMIN 2.3* 2.0* 2.0* 1.9* 1.9*   Coagulation Profile: Recent Labs  Lab 02/04/19 1838  INR 1.5*   CBG: Recent Labs  Lab 02/08/19 0838 02/08/19 1213 02/08/19 1828 02/08/19 2150 02/09/19 1232  GLUCAP 122* 102* 117* 119* 127*    Recent Results (from the past 240 hour(s))  MRSA PCR Screening     Status: None   Collection Time: 01/30/19  6:00 PM  Result Value Ref Range Status   MRSA by PCR NEGATIVE NEGATIVE Final    Comment:        The GeneXpert MRSA Assay (FDA approved for NASAL specimens only), is one component of a comprehensive MRSA colonization surveillance program. It is not intended to diagnose MRSA infection nor to guide or monitor treatment for MRSA infections. Performed at [facility], [place] [address] [place], [place] [address]   Culture, blood (single)  Status: Abnormal   Collection Time: 02/04/19  5:30 PM  Result Value Ref Range Status   Specimen Description BLOOD RIGHT ARM  Final   Special Requests   Final    BOTTLES DRAWN AEROBIC AND ANAEROBIC Blood Culture adequate volume   Culture  Setup Time   Final    GRAM POSITIVE COCCI IN BOTH AEROBIC AND ANAEROBIC BOTTLES Organism ID to follow CRITICAL RESULT CALLED TO, READ BACK BY AND VERIFIED WITH: Ellin Mayhew [facility] [id number] MLM Performed at [facility], [place] [address] [place], [place] [address]    Culture METHICILLIN RESISTANT STAPHYLOCOCCUS AUREUS (A)  Final   Report Status 02/08/2019 FINAL  Final   Organism ID, Bacteria METHICILLIN RESISTANT STAPHYLOCOCCUS AUREUS  Final      Susceptibility   Methicillin resistant staphylococcus aureus - MIC*    CIPROFLOXACIN <=0.5 SENSITIVE Sensitive     ERYTHROMYCIN <=0.25 SENSITIVE Sensitive     GENTAMICIN <=0.5 SENSITIVE Sensitive     OXACILLIN >=4 RESISTANT Resistant     TETRACYCLINE <=1 SENSITIVE Sensitive     VANCOMYCIN <=0.5 SENSITIVE Sensitive     TRIMETH/SULFA <=10 SENSITIVE  Sensitive     CLINDAMYCIN <=0.25 SENSITIVE Sensitive     RIFAMPIN <=0.5 SENSITIVE Sensitive     Inducible Clindamycin NEGATIVE Sensitive     * METHICILLIN RESISTANT STAPHYLOCOCCUS AUREUS  Blood Culture ID Panel (Reflexed)     Status: Abnormal   Collection Time: 02/04/19  5:30 PM  Result Value Ref Range Status   Enterococcus species NOT DETECTED NOT DETECTED Final   Listeria monocytogenes NOT DETECTED NOT DETECTED Final   Staphylococcus species DETECTED (A) NOT DETECTED Final    Comment: CRITICAL RESULT CALLED TO, READ BACK BY AND VERIFIED WITH: PHARMD M PHAM [id number] MLM    Staphylococcus aureus (BCID) DETECTED (A) NOT DETECTED Final    Comment: Methicillin (oxacillin) susceptible Staphylococcus aureus (MSSA). Preferred therapy is anti staphylococcal beta lactam antibiotic (Cefazolin or Nafcillin), unless clinically contraindicated. CRITICAL RESULT CALLED TO, READ BACK BY AND VERIFIED WITH: PHARMD M PHAM [id number] MLM    Methicillin resistance NOT DETECTED NOT DETECTED Final   Streptococcus species NOT DETECTED NOT DETECTED Final   Streptococcus agalactiae NOT DETECTED NOT DETECTED Final   Streptococcus pneumoniae NOT DETECTED NOT DETECTED Final   Streptococcus pyogenes NOT DETECTED NOT DETECTED Final   Acinetobacter baumannii NOT DETECTED NOT DETECTED Final   Enterobacteriaceae species NOT DETECTED NOT DETECTED Final   Enterobacter cloacae complex NOT DETECTED NOT DETECTED Final   Escherichia coli NOT DETECTED NOT DETECTED Final   Klebsiella oxytoca NOT DETECTED NOT DETECTED Final   Klebsiella pneumoniae NOT DETECTED NOT DETECTED Final   Proteus species NOT DETECTED NOT DETECTED Final   Serratia marcescens NOT DETECTED NOT DETECTED Final   Haemophilus influenzae NOT DETECTED NOT DETECTED Final   Neisseria meningitidis NOT DETECTED NOT DETECTED Final   Pseudomonas aeruginosa NOT DETECTED NOT DETECTED Final   Candida albicans NOT DETECTED NOT DETECTED Final   Candida glabrata  NOT DETECTED NOT DETECTED Final   Candida krusei NOT DETECTED NOT DETECTED Final   Candida parapsilosis NOT DETECTED NOT DETECTED Final   Candida tropicalis NOT DETECTED NOT DETECTED Final    Comment: Performed at [facility], [address], [place], [place] [address]  Culture, blood (Routine X 2) w Reflex to ID Panel     Status: Abnormal   Collection Time: 02/04/19  6:40 PM  Result Value Ref Range Status   Specimen Description BLOOD  RIGHT HAND  Final   Special Requests   Final    BOTTLES DRAWN AEROBIC AND ANAEROBIC Blood Culture adequate volume   Culture  Setup Time   Final    GRAM POSITIVE COCCI IN CLUSTERS IN BOTH AEROBIC AND ANAEROBIC BOTTLES CRITICAL VALUE NOTED.  VALUE IS CONSISTENT WITH PREVIOUSLY REPORTED AND CALLED VALUE.    Culture (A)  Final    STAPHYLOCOCCUS AUREUS SUSCEPTIBILITIES PERFORMED ON PREVIOUS CULTURE WITHIN THE LAST 5 DAYS. Performed at [facility], [place] [address], [place], [place] [address]    Report Status 02/08/2019 FINAL  Final  Aerobic Culture (superficial specimen)     Status: None   Collection Time: 02/04/19  6:44 PM  Result Value Ref Range Status   Specimen Description WOUND ARM  Final   Special Requests NONE  Final   Gram Stain   Final    RARE WBC PRESENT,BOTH PMN AND MONONUCLEAR RARE GRAM POSITIVE COCCI Performed at [facility], [place] [address] [place] [address] [place], [place] [address]    Culture   Final    MODERATE METHICILLIN RESISTANT STAPHYLOCOCCUS AUREUS   Report Status 02/06/2019 FINAL  Final   Organism ID, Bacteria METHICILLIN RESISTANT STAPHYLOCOCCUS AUREUS  Final      Susceptibility   Methicillin resistant staphylococcus aureus - MIC*    CIPROFLOXACIN <=0.5 SENSITIVE Sensitive     ERYTHROMYCIN <=0.25 SENSITIVE Sensitive     GENTAMICIN <=0.5 SENSITIVE Sensitive     OXACILLIN >=4 RESISTANT Resistant     TETRACYCLINE <=1 SENSITIVE Sensitive     VANCOMYCIN <=0.5 SENSITIVE Sensitive     TRIMETH/SULFA <=10 SENSITIVE Sensitive      CLINDAMYCIN <=0.25 SENSITIVE Sensitive     RIFAMPIN <=0.5 SENSITIVE Sensitive     Inducible Clindamycin NEGATIVE Sensitive     * MODERATE METHICILLIN RESISTANT STAPHYLOCOCCUS AUREUS  MRSA PCR Screening     Status: None   Collection Time: 02/04/19  6:56 PM  Result Value Ref Range Status   MRSA by PCR NEGATIVE NEGATIVE Final    Comment:        The GeneXpert MRSA Assay (FDA approved for NASAL specimens only), is one component of a comprehensive MRSA colonization surveillance program. It is not intended to diagnose MRSA infection nor to guide or monitor treatment for MRSA infections. Performed at [facility], [place] [address], [place], [place] [address]   Culture, blood (routine x 2)     Status: None (Preliminary result)   Collection Time: 02/06/19  7:13 AM  Result Value Ref Range Status   Specimen Description BLOOD LEFT ANTECUBITAL  Final   Special Requests   Final    BOTTLES DRAWN AEROBIC ONLY Blood Culture adequate volume   Culture   Final    NO GROWTH 3 DAYS Performed at [facility], [place] [address], [place], [place] [address]    Report Status PENDING  Incomplete  Culture, blood (routine x 2)     Status: None (Preliminary result)   Collection Time: 02/06/19 10:51 AM  Result Value Ref Range Status   Specimen Description BLOOD LEFT HAND  Final   Special Requests   Final    BOTTLES DRAWN AEROBIC ONLY Blood Culture adequate volume   Culture   Final    NO GROWTH 3 DAYS Performed at [facility], [place] [address], [place], [place] [address]    Report Status PENDING  Incomplete         Radiology Studies: [place] [facility]  Result Date: 02/09/2019 If [facility] image not attached, placement could not be confirmed due to current cardiac rhythm.       Scheduled Meds: . apixaban  5 mg Oral BID  . feeding supplement (GLUCERNA SHAKE)  237 mL Oral TID BM  . insulin aspart  0-9 Units Subcutaneous TID WC  . pantoprazole  40 mg Oral QHS   Continuous  Infusions: . vancomycin 1,250 mg (02/09/19 0629)     LOS: 10 days     Vernell Leep, MD, FACP, [facility]. [facility]  To contact the attending provider between 7A-7P or the covering provider during after hours 7P-7A, please log into the web site [email or web address] and access using universal [place] password for that web site. If you do not have the password, please call the hospital operator.  02/09/2019, 2:24 PM

## 2019-02-09 NOTE — Plan of Care (Signed)

## 2019-02-09 NOTE — Progress Notes (Signed)
Peripherally Inserted Central Catheter/Midline Placement  The IV Nurse has discussed with the patient and/or persons authorized to consent for the patient, the purpose of this procedure and the potential benefits and risks involved with this procedure.  The benefits include less needle sticks, lab draws from the catheter, and the patient may be discharged home with the catheter. Risks include, but not limited to, infection, bleeding, blood clot (thrombus formation), and puncture of an artery; nerve damage and irregular heartbeat and possibility to perform a PICC exchange if needed/ordered by physician.  Alternatives to this procedure were also discussed.  Bard Power PICC patient education guide, fact sheet on infection prevention and patient information card has been provided to patient /or left at bedside.    PICC/Midline Placement Documentation        Jon Nelson 02/09/2019, 3:12 PM

## 2019-02-09 NOTE — Progress Notes (Signed)
Patient ready for discharge home with sister .  Reviewed all discharge instructions including medicaitons and f/u appointments.

## 2019-02-09 NOTE — Progress Notes (Signed)
PHARMACY CONSULT NOTE FOR:  OUTPATIENT  PARENTERAL ANTIBIOTIC THERAPY (OPAT)  Indication: MRSA bacteremia secondary to line  Regimen: Vancomycin 1250 mg Q 12 hours  End date: 02/19/2019  IV antibiotic discharge orders are pended. To discharging provider:  please sign these orders via discharge navigator,  Select New Orders & click on the button choice - Manage This Unsigned Work.     Thank you for allowing pharmacy to be a part of this patient's care.  Jimmy Footman, PharmD, BCPS, [facility] Clinical Pharmacist Phone: [phone] 02/09/2019, 8:27 AM

## 2019-02-09 NOTE — Progress Notes (Addendum)
Pharmacy Antibiotic Note  Jon Nelson is a 73 y.o. male admitted on 01/30/2019 with bacteremia.  Pharmacy has been consulted for Vancomycin dosing.  3/10 AM update: AUC is below goal at 325   Plan: Increase vancomycin to 1250 mg IV q12h >>New estimated AUC: 406 Re-check levels as needed   Height: 5\' 9"  (175.3 cm) Weight: 176 lb 2.4 oz (79.9 kg) IBW/kg (Calculated) : 70.7  Temp (24hrs), Avg:98.1 F (36.7 C), Min:97.6 F (36.4 C), Max:98.8 F (37.1 C)  Recent Labs  Lab 02/04/19 1838 02/04/19 2106 02/05/19 0512 02/05/19 1838 02/06/19 0713 02/07/19 1529 02/08/19 0555 02/08/19 1701 02/08/19 2247  WBC 12.5*  --  19.4*  --  12.5* 11.9* 10.9*  --   --   CREATININE 0.73  --  0.56* 0.86 0.86 0.58* 0.52*  --   --   LATICACIDVEN 1.4 1.3  --   --   --   --   --   --   --   VANCOTROUGH  --   --   --   --   --   --   --  9*  --   VANCOPEAK  --   --   --   --   --   --   --   --  16*    Estimated Creatinine Clearance: 82.2 mL/min (A) (by C-G formula based on SCr of 0.52 mg/dL (L)).    No Known Allergies   Narda Bonds 02/09/2019 12:44 AM

## 2019-02-09 NOTE — Care Management Note (Signed)
Case Management Note  Patient Details  Name: Jon Nelson MRN: [id number] Date of Birth: 07-19-1946  Subjective/Objective:                    Action/Plan: Pt discharging to his sisters home today. Pt agreeable with choice of [facility].  Sister to provide transportation home.  Expected Discharge Date:  02/09/19               Expected Discharge Plan:  [place]  In-House Referral:  NA  Discharge planning Services  CM Consult  Post Acute Care Choice:  Home Health Choice offered to:  Patient  DME Arranged:  N/A DME Agency:  NA  HH Arranged:  RN, Disease Management, PT, OT, Speech Therapy, Social Work [employer] Agency:  [facility]  Status of Service:  Completed, signed off  If discussed at [employer], dates discussed:    Additional Comments:  Pollie Friar, RN 02/09/2019, 3:41 PM

## 2019-02-09 NOTE — Progress Notes (Signed)
STROKE TEAM NOTE   SUBJECTIVE Patient  Sitting up in chair, NAD. Plan for PICC line today and to discharge home with home health.   OBJECTIVE Vitals:   02/09/19 0308 02/09/19 0857 02/09/19 0858 02/09/19 1129  BP: 113/66 132/74 132/74 121/77  Pulse: 92 91 90 88  Resp: 18  18   Temp: 98 F (36.7 C) 97.8 F (36.6 C) 97.8 F (36.6 C)   TempSrc: Oral Oral Oral   SpO2: 96% 98% 98% 98%  Weight:      Height:        Lipid Panel:     Component Value Date/Time   CHOL 120 01/31/2019 0513   TRIG 137 01/31/2019 0513   HDL 22 (L) 01/31/2019 0513   CHOLHDL 5.5 01/31/2019 0513   VLDL 27 01/31/2019 0513   LDLCALC 71 01/31/2019 0513   HgbA1c:  Lab Results  Component Value Date   HGBA1C 6.6 (H) 01/31/2019   IMAGING  CTA of H/N at [facility]  01/30/2019  No LVO but cervical carotid artery atherosclerosis, prominent soft plaque in the left carotid bulb, atherosclerosis with mild right and moderate left cavernous ICA stenosis and mod-severe right P1 stenosis. Moderate distal left vertebral artery stenosis.   CT HEAD WO CONTRAST  01/30/2019 Was negative for acute event at [facility], moderate small-vessel ischemic changes  Mr Brain Wo Contrast 01/31/2019 1. Small scattered foci of acute/early subacute infarction are present within the left posterosuperior frontal and parietal lobes inclusive of precentral gyrus. No hemorrhage or mass effect. 2. Moderate chronic microvascular ischemic changes and volume loss of the brain. 3. Paranasal sinus disease with fluid levels which may represent acute sinusitis. Partial bilateral mastoid opacification.   Carotid Doppler   02/01/2019 There is 1-39% bilateral ICA stenosis. Vertebral artery flow is antegrade.    Transthoracic Echocardiogram  1. The left ventricle has normal systolic function with an ejection fraction of 60-65%. The cavity size was normal. Left ventricular diastolic Doppler parameters are consistent with impaired relaxation.  2. The right  ventricle has normal systolic function. The cavity was normal. There is no increase in right ventricular wall thickness.  3. The mitral valve is normal in structure.  4. The tricuspid valve is normal in structure.  5. The aortic valve is tricuspid.  6. The pulmonic valve was normal in structure.  7. Normal LV systolic function; mild diastolic dysfunction.  8. The interatrial septum appears to be lipomatous.  TEE 02/03/2019 IMPRESSIONS  1. The left ventricle has normal systolic function, with an ejection fraction of 60-65%. The cavity size was normal.  2. The right ventricle has normal systolc function. The cavity was normal. There is no increase in right ventricular wall thickness.  3. The tricuspid valve was normal in structure.  4. The aortic valve is tricuspid Mild thickening of the aortic valve Mild calcification of the aortic valve. Aortic valve regurgitation is trivial by color flow Doppler.  5. The pulmonic valve was normal in structure.  6. The aortic root is normal in size and structure.   Blood Cultures 02/04/19 ; gram-positive cocci in clusters in both aerobic and anaerobic bottles staph aureus  Physical Exam    pleasant elderly   male not in distress. . Afebrile. Head is nontraumatic. Neck is supple without bruit.    Cardiac exam no murmur or gallop. Lungs are clear to auscultation. Distal pulses are well felt.  Right upper extremity mildly swollen Neurological Exam ;  Awake alert oriented 3 with normal speech and language  function. No aphasia, apraxia dysarthria. Extraocular moments are full range without nystagmus. Blinks to threat bilaterally. Fundi not visualized. Face is symmetric without weakness. Tongue midline. Motor system exam reveals left upper extremity 3/5 and left lower extremity 4+/5. DTRs are symmetric. Sensation is intact. Plantars are downgoing. Gait not tested.  ASSESSMENT/PLAN [name] with history of diabetes, HTN, TIA, GIB, HLD,  depression presenting with sudden right arm weakness.  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   CTA H&N - cervical carotid artery atherosclerosis, prominent soft plaque in the left carotid bulb, atherosclerosis with mild right and moderate left cavernous ICA stenosis and mod-severe right P1 stenosis. Moderate distal left vertebral artery stenosis.   Carotid Doppler left ICA 40 to 59% stenosis    TEE 02/03/2019 - EF 60 - 65%. No PFO  Loop recorder placed  LDL - 71  HgbA1c - 6.6  UDS - positive for opiates  VTE prophylaxis - SCDs  No antithrombotic prior to admission, now on Eliquis 5 mg twice daily due to right arm DVT.  Continue Eliquis for 3 months and then switched to aspirin 325 daily.  Therapy recommendations:  Home Health PT recommended  Disposition: home with home health  Left carotid stenosis  CTA head and neck showed left ICA prominent soft plaque at ICA bulb  Carotid Doppler left ICA 40 to 59% stenosis  Vascular surgery Dr. Donzetta Matters consulted  Decided no intervention needed at this time, will follow-up in office in 6 months with Dr. Donzetta Matters  Right arm basilic vein thrombosis  Right upper extremity Doppler confirmed  On Eliquis 5 mg twice daily  Likely provoked due to septic phlebitis  Continue AC for 3 months and then aspirin 325 daily  Bacteremia and right upper extremity cellulitis  Fever resolved  Leukocytosis improving, WBC 12.5-19.4-12.5  TEE 02/03/2019 no endocarditis  Right upper extremity IV site pus culture shows gram-positive cocci  Blood cultures 02/04/2019 positive for  MRSA  Blood culture 02/06/2019 - no growth so far   ID on board, recommend PICC line today with home IV vancomycin for total of 14 days; ( 10 days left)  Continue IV vancomycin   Hypertension  Stable  . Long-term BP goal normotensive  Hyperlipidemia  Lipid lowering medication PTA:  none  LDL 71, goal < 70  Lipitor on hold due to elevated AST/ALT, which is improving  Diabetes  HgbA1c 6.6, at goal < 7.0  Controlled  SSI  CBG monitoring  Other Stroke Risk Factors  Advanced age  Other Active Problems  Recent flu like sxs - on Tamiflu - droplet precautions dc'd 3/4/ 2020  Elevated LFTs - continue to improve  Hypokalemia - 3.3 -> supplement -> 3.5   Hospital day # [place], MSN, NP-C [facility] [phone]    To contact Stroke Continuity provider, please refer to [email or web address] After hours, contact General Neurology

## 2019-02-10 DIAGNOSIS — I69322 Dysarthria following cerebral infarction: Secondary | ICD-10-CM | POA: Diagnosis not present

## 2019-02-10 DIAGNOSIS — I69351 Hemiplegia and hemiparesis following cerebral infarction affecting right dominant side: Secondary | ICD-10-CM | POA: Diagnosis not present

## 2019-02-10 DIAGNOSIS — I82611 Acute embolism and thrombosis of superficial veins of right upper extremity: Secondary | ICD-10-CM | POA: Diagnosis not present

## 2019-02-10 DIAGNOSIS — T82868D Thrombosis of vascular prosthetic devices, implants and grafts, subsequent encounter: Secondary | ICD-10-CM | POA: Diagnosis not present

## 2019-02-10 DIAGNOSIS — I700 Atherosclerosis of aorta: Secondary | ICD-10-CM | POA: Diagnosis not present

## 2019-02-10 DIAGNOSIS — Z452 Encounter for adjustment and management of vascular access device: Secondary | ICD-10-CM | POA: Diagnosis not present

## 2019-02-10 DIAGNOSIS — I119 Hypertensive heart disease without heart failure: Secondary | ICD-10-CM | POA: Diagnosis not present

## 2019-02-10 DIAGNOSIS — I69391 Dysphagia following cerebral infarction: Secondary | ICD-10-CM | POA: Diagnosis not present

## 2019-02-10 DIAGNOSIS — L03113 Cellulitis of right upper limb: Secondary | ICD-10-CM | POA: Diagnosis not present

## 2019-02-10 DIAGNOSIS — R419 Unspecified symptoms and signs involving cognitive functions and awareness: Secondary | ICD-10-CM | POA: Diagnosis not present

## 2019-02-11 DIAGNOSIS — I82611 Acute embolism and thrombosis of superficial veins of right upper extremity: Secondary | ICD-10-CM | POA: Diagnosis not present

## 2019-02-11 DIAGNOSIS — I69322 Dysarthria following cerebral infarction: Secondary | ICD-10-CM | POA: Diagnosis not present

## 2019-02-11 DIAGNOSIS — I69391 Dysphagia following cerebral infarction: Secondary | ICD-10-CM | POA: Diagnosis not present

## 2019-02-11 DIAGNOSIS — T82868D Thrombosis of vascular prosthetic devices, implants and grafts, subsequent encounter: Secondary | ICD-10-CM | POA: Diagnosis not present

## 2019-02-11 DIAGNOSIS — I700 Atherosclerosis of aorta: Secondary | ICD-10-CM | POA: Diagnosis not present

## 2019-02-11 DIAGNOSIS — Z452 Encounter for adjustment and management of vascular access device: Secondary | ICD-10-CM | POA: Diagnosis not present

## 2019-02-11 DIAGNOSIS — I69351 Hemiplegia and hemiparesis following cerebral infarction affecting right dominant side: Secondary | ICD-10-CM | POA: Diagnosis not present

## 2019-02-11 DIAGNOSIS — I69331 Monoplegia of upper limb following cerebral infarction affecting right dominant side: Secondary | ICD-10-CM | POA: Diagnosis not present

## 2019-02-11 DIAGNOSIS — L03113 Cellulitis of right upper limb: Secondary | ICD-10-CM | POA: Diagnosis not present

## 2019-02-11 DIAGNOSIS — R419 Unspecified symptoms and signs involving cognitive functions and awareness: Secondary | ICD-10-CM | POA: Diagnosis not present

## 2019-02-11 DIAGNOSIS — I119 Hypertensive heart disease without heart failure: Secondary | ICD-10-CM | POA: Diagnosis not present

## 2019-02-11 LAB — CULTURE, BLOOD (ROUTINE X 2)
Culture: NO GROWTH
Culture: NO GROWTH
SPECIAL REQUESTS: ADEQUATE
Special Requests: ADEQUATE

## 2019-02-12 DIAGNOSIS — T82868D Thrombosis of vascular prosthetic devices, implants and grafts, subsequent encounter: Secondary | ICD-10-CM | POA: Diagnosis not present

## 2019-02-12 DIAGNOSIS — I69351 Hemiplegia and hemiparesis following cerebral infarction affecting right dominant side: Secondary | ICD-10-CM | POA: Diagnosis not present

## 2019-02-12 DIAGNOSIS — L03113 Cellulitis of right upper limb: Secondary | ICD-10-CM | POA: Diagnosis not present

## 2019-02-12 DIAGNOSIS — Z452 Encounter for adjustment and management of vascular access device: Secondary | ICD-10-CM | POA: Diagnosis not present

## 2019-02-12 DIAGNOSIS — I82611 Acute embolism and thrombosis of superficial veins of right upper extremity: Secondary | ICD-10-CM | POA: Diagnosis not present

## 2019-02-12 DIAGNOSIS — I69391 Dysphagia following cerebral infarction: Secondary | ICD-10-CM | POA: Diagnosis not present

## 2019-02-12 DIAGNOSIS — I700 Atherosclerosis of aorta: Secondary | ICD-10-CM | POA: Diagnosis not present

## 2019-02-12 DIAGNOSIS — R419 Unspecified symptoms and signs involving cognitive functions and awareness: Secondary | ICD-10-CM | POA: Diagnosis not present

## 2019-02-12 DIAGNOSIS — I69322 Dysarthria following cerebral infarction: Secondary | ICD-10-CM | POA: Diagnosis not present

## 2019-02-12 DIAGNOSIS — I119 Hypertensive heart disease without heart failure: Secondary | ICD-10-CM | POA: Diagnosis not present

## 2019-02-15 DIAGNOSIS — I69322 Dysarthria following cerebral infarction: Secondary | ICD-10-CM | POA: Diagnosis not present

## 2019-02-15 DIAGNOSIS — I82611 Acute embolism and thrombosis of superficial veins of right upper extremity: Secondary | ICD-10-CM | POA: Diagnosis not present

## 2019-02-15 DIAGNOSIS — I700 Atherosclerosis of aorta: Secondary | ICD-10-CM | POA: Diagnosis not present

## 2019-02-15 DIAGNOSIS — R7881 Bacteremia: Secondary | ICD-10-CM | POA: Diagnosis not present

## 2019-02-15 DIAGNOSIS — L03113 Cellulitis of right upper limb: Secondary | ICD-10-CM | POA: Diagnosis not present

## 2019-02-15 DIAGNOSIS — I119 Hypertensive heart disease without heart failure: Secondary | ICD-10-CM | POA: Diagnosis not present

## 2019-02-15 DIAGNOSIS — R419 Unspecified symptoms and signs involving cognitive functions and awareness: Secondary | ICD-10-CM | POA: Diagnosis not present

## 2019-02-15 DIAGNOSIS — Z452 Encounter for adjustment and management of vascular access device: Secondary | ICD-10-CM | POA: Diagnosis not present

## 2019-02-15 DIAGNOSIS — T82868D Thrombosis of vascular prosthetic devices, implants and grafts, subsequent encounter: Secondary | ICD-10-CM | POA: Diagnosis not present

## 2019-02-15 DIAGNOSIS — I69351 Hemiplegia and hemiparesis following cerebral infarction affecting right dominant side: Secondary | ICD-10-CM | POA: Diagnosis not present

## 2019-02-15 DIAGNOSIS — I69391 Dysphagia following cerebral infarction: Secondary | ICD-10-CM | POA: Diagnosis not present

## 2019-02-16 ENCOUNTER — Ambulatory Visit (INDEPENDENT_AMBULATORY_CARE_PROVIDER_SITE_OTHER): Payer: Medicare HMO | Admitting: Nurse Practitioner

## 2019-02-16 ENCOUNTER — Other Ambulatory Visit: Payer: Self-pay

## 2019-02-16 DIAGNOSIS — R419 Unspecified symptoms and signs involving cognitive functions and awareness: Secondary | ICD-10-CM | POA: Diagnosis not present

## 2019-02-16 DIAGNOSIS — I69391 Dysphagia following cerebral infarction: Secondary | ICD-10-CM | POA: Diagnosis not present

## 2019-02-16 DIAGNOSIS — T82868D Thrombosis of vascular prosthetic devices, implants and grafts, subsequent encounter: Secondary | ICD-10-CM | POA: Diagnosis not present

## 2019-02-16 DIAGNOSIS — L03113 Cellulitis of right upper limb: Secondary | ICD-10-CM | POA: Diagnosis not present

## 2019-02-16 DIAGNOSIS — I119 Hypertensive heart disease without heart failure: Secondary | ICD-10-CM | POA: Diagnosis not present

## 2019-02-16 DIAGNOSIS — I82611 Acute embolism and thrombosis of superficial veins of right upper extremity: Secondary | ICD-10-CM | POA: Diagnosis not present

## 2019-02-16 DIAGNOSIS — I700 Atherosclerosis of aorta: Secondary | ICD-10-CM | POA: Diagnosis not present

## 2019-02-16 DIAGNOSIS — Z452 Encounter for adjustment and management of vascular access device: Secondary | ICD-10-CM | POA: Diagnosis not present

## 2019-02-16 DIAGNOSIS — I69322 Dysarthria following cerebral infarction: Secondary | ICD-10-CM | POA: Diagnosis not present

## 2019-02-16 DIAGNOSIS — I639 Cerebral infarction, unspecified: Secondary | ICD-10-CM

## 2019-02-16 DIAGNOSIS — I69351 Hemiplegia and hemiparesis following cerebral infarction affecting right dominant side: Secondary | ICD-10-CM | POA: Diagnosis not present

## 2019-02-16 NOTE — Progress Notes (Signed)
ILR wound check in clinic. Steri strips removed. Wound well healed. Home monitor transmitting nightly. No episodes. Questions answered.  

## 2019-02-18 ENCOUNTER — Other Ambulatory Visit: Payer: Self-pay

## 2019-02-18 ENCOUNTER — Encounter: Payer: Self-pay | Admitting: Internal Medicine

## 2019-02-18 ENCOUNTER — Telehealth: Payer: Self-pay

## 2019-02-18 ENCOUNTER — Ambulatory Visit: Payer: Medicare HMO | Admitting: Internal Medicine

## 2019-02-18 DIAGNOSIS — I639 Cerebral infarction, unspecified: Secondary | ICD-10-CM

## 2019-02-18 DIAGNOSIS — Z452 Encounter for adjustment and management of vascular access device: Secondary | ICD-10-CM | POA: Diagnosis not present

## 2019-02-18 DIAGNOSIS — R7881 Bacteremia: Secondary | ICD-10-CM

## 2019-02-18 NOTE — Progress Notes (Signed)
   Subjective:    Patient ID: Jon Nelson, male    DOB: 12/11/1945, 73 y.o.   MRN: [id number]  HPI Here for follow up from recent hospitalization. He developed a CVA and during his hospitalization developed MRSA bacteremia.  He had a right arm thrombophlebitis as likely cause.  He had a negative TEE.  He is completing 14 days of vancomycin tomorrow. He feels well with no associated fever or chills.  No n/v/d or rash.  No complaints today.     Review of Systems  Constitutional: Negative for fatigue and fever.  Gastrointestinal: Negative for diarrhea and nausea.  Skin: Negative for rash.       Objective:   Physical Exam Constitutional:      Appearance: Normal appearance.  Eyes:     General: No scleral icterus. Cardiovascular:     Rate and Rhythm: Normal rate and regular rhythm.     Heart sounds: No murmur.  Pulmonary:     Effort: Pulmonary effort is normal.     Breath sounds: Normal breath sounds.  Musculoskeletal:     Comments: picc line without surrounding erythema  Skin:    Findings: No rash.  Neurological:     Mental Status: He is alert.    SH: no tobacco       Assessment & Plan:

## 2019-02-18 NOTE — Telephone Encounter (Signed)
Per Dr. Linus Salmons called [facility] to have patient's picc line removed after last dose of IV Vancomycin. Mary with [place] was able to take the call and verbal order. Will relay order to home health nurse. [place]

## 2019-02-21 DIAGNOSIS — Z452 Encounter for adjustment and management of vascular access device: Secondary | ICD-10-CM | POA: Insufficient documentation

## 2019-02-21 NOTE — Assessment & Plan Note (Signed)
Recovering well from this.  

## 2019-02-21 NOTE — Assessment & Plan Note (Signed)
Doing well, completing treatment and no new concerns.   rtc as needed

## 2019-02-21 NOTE — Assessment & Plan Note (Signed)
No issues with the picc line and will be removed after his last dose (he has 4 left)

## 2019-02-22 DIAGNOSIS — Z452 Encounter for adjustment and management of vascular access device: Secondary | ICD-10-CM | POA: Diagnosis not present

## 2019-02-22 DIAGNOSIS — R419 Unspecified symptoms and signs involving cognitive functions and awareness: Secondary | ICD-10-CM | POA: Diagnosis not present

## 2019-02-22 DIAGNOSIS — I69322 Dysarthria following cerebral infarction: Secondary | ICD-10-CM | POA: Diagnosis not present

## 2019-02-22 DIAGNOSIS — I69391 Dysphagia following cerebral infarction: Secondary | ICD-10-CM | POA: Diagnosis not present

## 2019-02-22 DIAGNOSIS — I700 Atherosclerosis of aorta: Secondary | ICD-10-CM | POA: Diagnosis not present

## 2019-02-22 DIAGNOSIS — L03113 Cellulitis of right upper limb: Secondary | ICD-10-CM | POA: Diagnosis not present

## 2019-02-22 DIAGNOSIS — I69351 Hemiplegia and hemiparesis following cerebral infarction affecting right dominant side: Secondary | ICD-10-CM | POA: Diagnosis not present

## 2019-02-22 DIAGNOSIS — I119 Hypertensive heart disease without heart failure: Secondary | ICD-10-CM | POA: Diagnosis not present

## 2019-02-22 DIAGNOSIS — T82868D Thrombosis of vascular prosthetic devices, implants and grafts, subsequent encounter: Secondary | ICD-10-CM | POA: Diagnosis not present

## 2019-02-22 DIAGNOSIS — I82611 Acute embolism and thrombosis of superficial veins of right upper extremity: Secondary | ICD-10-CM | POA: Diagnosis not present

## 2019-02-23 DIAGNOSIS — I82611 Acute embolism and thrombosis of superficial veins of right upper extremity: Secondary | ICD-10-CM | POA: Diagnosis not present

## 2019-02-23 DIAGNOSIS — I69351 Hemiplegia and hemiparesis following cerebral infarction affecting right dominant side: Secondary | ICD-10-CM | POA: Diagnosis not present

## 2019-02-23 DIAGNOSIS — I69391 Dysphagia following cerebral infarction: Secondary | ICD-10-CM | POA: Diagnosis not present

## 2019-02-23 DIAGNOSIS — I700 Atherosclerosis of aorta: Secondary | ICD-10-CM | POA: Diagnosis not present

## 2019-02-23 DIAGNOSIS — R419 Unspecified symptoms and signs involving cognitive functions and awareness: Secondary | ICD-10-CM | POA: Diagnosis not present

## 2019-02-23 DIAGNOSIS — I69322 Dysarthria following cerebral infarction: Secondary | ICD-10-CM | POA: Diagnosis not present

## 2019-02-23 DIAGNOSIS — I119 Hypertensive heart disease without heart failure: Secondary | ICD-10-CM | POA: Diagnosis not present

## 2019-02-23 DIAGNOSIS — Z452 Encounter for adjustment and management of vascular access device: Secondary | ICD-10-CM | POA: Diagnosis not present

## 2019-02-23 DIAGNOSIS — T82868D Thrombosis of vascular prosthetic devices, implants and grafts, subsequent encounter: Secondary | ICD-10-CM | POA: Diagnosis not present

## 2019-02-23 DIAGNOSIS — L03113 Cellulitis of right upper limb: Secondary | ICD-10-CM | POA: Diagnosis not present

## 2019-02-25 DIAGNOSIS — I69322 Dysarthria following cerebral infarction: Secondary | ICD-10-CM | POA: Diagnosis not present

## 2019-02-25 DIAGNOSIS — L03113 Cellulitis of right upper limb: Secondary | ICD-10-CM | POA: Diagnosis not present

## 2019-02-25 DIAGNOSIS — R419 Unspecified symptoms and signs involving cognitive functions and awareness: Secondary | ICD-10-CM | POA: Diagnosis not present

## 2019-02-25 DIAGNOSIS — I69391 Dysphagia following cerebral infarction: Secondary | ICD-10-CM | POA: Diagnosis not present

## 2019-02-25 DIAGNOSIS — I69351 Hemiplegia and hemiparesis following cerebral infarction affecting right dominant side: Secondary | ICD-10-CM | POA: Diagnosis not present

## 2019-02-25 DIAGNOSIS — I700 Atherosclerosis of aorta: Secondary | ICD-10-CM | POA: Diagnosis not present

## 2019-02-25 DIAGNOSIS — I82611 Acute embolism and thrombosis of superficial veins of right upper extremity: Secondary | ICD-10-CM | POA: Diagnosis not present

## 2019-02-25 DIAGNOSIS — T82868D Thrombosis of vascular prosthetic devices, implants and grafts, subsequent encounter: Secondary | ICD-10-CM | POA: Diagnosis not present

## 2019-02-25 DIAGNOSIS — Z452 Encounter for adjustment and management of vascular access device: Secondary | ICD-10-CM | POA: Diagnosis not present

## 2019-02-25 DIAGNOSIS — I119 Hypertensive heart disease without heart failure: Secondary | ICD-10-CM | POA: Diagnosis not present

## 2019-03-01 DIAGNOSIS — Z452 Encounter for adjustment and management of vascular access device: Secondary | ICD-10-CM | POA: Diagnosis not present

## 2019-03-01 DIAGNOSIS — L03113 Cellulitis of right upper limb: Secondary | ICD-10-CM | POA: Diagnosis not present

## 2019-03-01 DIAGNOSIS — I700 Atherosclerosis of aorta: Secondary | ICD-10-CM | POA: Diagnosis not present

## 2019-03-01 DIAGNOSIS — I69391 Dysphagia following cerebral infarction: Secondary | ICD-10-CM | POA: Diagnosis not present

## 2019-03-01 DIAGNOSIS — R419 Unspecified symptoms and signs involving cognitive functions and awareness: Secondary | ICD-10-CM | POA: Diagnosis not present

## 2019-03-01 DIAGNOSIS — I119 Hypertensive heart disease without heart failure: Secondary | ICD-10-CM | POA: Diagnosis not present

## 2019-03-01 DIAGNOSIS — T82868D Thrombosis of vascular prosthetic devices, implants and grafts, subsequent encounter: Secondary | ICD-10-CM | POA: Diagnosis not present

## 2019-03-01 DIAGNOSIS — I82611 Acute embolism and thrombosis of superficial veins of right upper extremity: Secondary | ICD-10-CM | POA: Diagnosis not present

## 2019-03-01 DIAGNOSIS — I69351 Hemiplegia and hemiparesis following cerebral infarction affecting right dominant side: Secondary | ICD-10-CM | POA: Diagnosis not present

## 2019-03-01 DIAGNOSIS — I69322 Dysarthria following cerebral infarction: Secondary | ICD-10-CM | POA: Diagnosis not present

## 2019-03-02 DIAGNOSIS — I69351 Hemiplegia and hemiparesis following cerebral infarction affecting right dominant side: Secondary | ICD-10-CM | POA: Diagnosis not present

## 2019-03-02 DIAGNOSIS — T82868D Thrombosis of vascular prosthetic devices, implants and grafts, subsequent encounter: Secondary | ICD-10-CM | POA: Diagnosis not present

## 2019-03-02 DIAGNOSIS — L03113 Cellulitis of right upper limb: Secondary | ICD-10-CM | POA: Diagnosis not present

## 2019-03-02 DIAGNOSIS — I82611 Acute embolism and thrombosis of superficial veins of right upper extremity: Secondary | ICD-10-CM | POA: Diagnosis not present

## 2019-03-02 DIAGNOSIS — I69322 Dysarthria following cerebral infarction: Secondary | ICD-10-CM | POA: Diagnosis not present

## 2019-03-02 DIAGNOSIS — Z452 Encounter for adjustment and management of vascular access device: Secondary | ICD-10-CM | POA: Diagnosis not present

## 2019-03-02 DIAGNOSIS — I69391 Dysphagia following cerebral infarction: Secondary | ICD-10-CM | POA: Diagnosis not present

## 2019-03-02 DIAGNOSIS — I119 Hypertensive heart disease without heart failure: Secondary | ICD-10-CM | POA: Diagnosis not present

## 2019-03-02 DIAGNOSIS — I700 Atherosclerosis of aorta: Secondary | ICD-10-CM | POA: Diagnosis not present

## 2019-03-02 DIAGNOSIS — R419 Unspecified symptoms and signs involving cognitive functions and awareness: Secondary | ICD-10-CM | POA: Diagnosis not present

## 2019-03-08 ENCOUNTER — Ambulatory Visit (INDEPENDENT_AMBULATORY_CARE_PROVIDER_SITE_OTHER): Payer: Medicare HMO | Admitting: *Deleted

## 2019-03-08 ENCOUNTER — Other Ambulatory Visit: Payer: Self-pay

## 2019-03-08 DIAGNOSIS — I639 Cerebral infarction, unspecified: Secondary | ICD-10-CM | POA: Diagnosis not present

## 2019-03-08 LAB — CUP PACEART REMOTE DEVICE CHECK
Date Time Interrogation Session: 20200406165442
Implantable Pulse Generator Implant Date: 20200304

## 2019-03-09 ENCOUNTER — Telehealth: Payer: Self-pay | Admitting: Adult Health

## 2019-03-09 DIAGNOSIS — L03113 Cellulitis of right upper limb: Secondary | ICD-10-CM | POA: Diagnosis not present

## 2019-03-09 DIAGNOSIS — I700 Atherosclerosis of aorta: Secondary | ICD-10-CM | POA: Diagnosis not present

## 2019-03-09 DIAGNOSIS — Z452 Encounter for adjustment and management of vascular access device: Secondary | ICD-10-CM | POA: Diagnosis not present

## 2019-03-09 DIAGNOSIS — I119 Hypertensive heart disease without heart failure: Secondary | ICD-10-CM | POA: Diagnosis not present

## 2019-03-09 DIAGNOSIS — I69351 Hemiplegia and hemiparesis following cerebral infarction affecting right dominant side: Secondary | ICD-10-CM | POA: Diagnosis not present

## 2019-03-09 DIAGNOSIS — R419 Unspecified symptoms and signs involving cognitive functions and awareness: Secondary | ICD-10-CM | POA: Diagnosis not present

## 2019-03-09 DIAGNOSIS — T82868D Thrombosis of vascular prosthetic devices, implants and grafts, subsequent encounter: Secondary | ICD-10-CM | POA: Diagnosis not present

## 2019-03-09 DIAGNOSIS — I69391 Dysphagia following cerebral infarction: Secondary | ICD-10-CM | POA: Diagnosis not present

## 2019-03-09 DIAGNOSIS — I82611 Acute embolism and thrombosis of superficial veins of right upper extremity: Secondary | ICD-10-CM | POA: Diagnosis not present

## 2019-03-09 DIAGNOSIS — I69322 Dysarthria following cerebral infarction: Secondary | ICD-10-CM | POA: Diagnosis not present

## 2019-03-09 NOTE — Telephone Encounter (Signed)
Due to current COVID 19 pandemic, our office is severely reducing in office visits for at least the next 2 weeks, in order to minimize the risk to our patients and healthcare providers. Pt understands that although there may be some limitations with this type of visit, we will take all precautions to reduce any security or privacy concerns.  Pt understands that this will be treated like an in office visit and we will file with pt's insurance, and there may be a patient responsible charge related to this service. Pt's email is [email or web address]. Pt understands that the cisco webex software must be downloaded and operational on the device pt plans to use for the visit.

## 2019-03-12 DIAGNOSIS — L03113 Cellulitis of right upper limb: Secondary | ICD-10-CM | POA: Diagnosis not present

## 2019-03-12 DIAGNOSIS — I82611 Acute embolism and thrombosis of superficial veins of right upper extremity: Secondary | ICD-10-CM | POA: Diagnosis not present

## 2019-03-12 DIAGNOSIS — I69351 Hemiplegia and hemiparesis following cerebral infarction affecting right dominant side: Secondary | ICD-10-CM | POA: Diagnosis not present

## 2019-03-12 DIAGNOSIS — I119 Hypertensive heart disease without heart failure: Secondary | ICD-10-CM | POA: Diagnosis not present

## 2019-03-12 DIAGNOSIS — R419 Unspecified symptoms and signs involving cognitive functions and awareness: Secondary | ICD-10-CM | POA: Diagnosis not present

## 2019-03-12 DIAGNOSIS — I69391 Dysphagia following cerebral infarction: Secondary | ICD-10-CM | POA: Diagnosis not present

## 2019-03-12 DIAGNOSIS — Z452 Encounter for adjustment and management of vascular access device: Secondary | ICD-10-CM | POA: Diagnosis not present

## 2019-03-12 DIAGNOSIS — I69322 Dysarthria following cerebral infarction: Secondary | ICD-10-CM | POA: Diagnosis not present

## 2019-03-12 DIAGNOSIS — I700 Atherosclerosis of aorta: Secondary | ICD-10-CM | POA: Diagnosis not present

## 2019-03-12 DIAGNOSIS — T82868D Thrombosis of vascular prosthetic devices, implants and grafts, subsequent encounter: Secondary | ICD-10-CM | POA: Diagnosis not present

## 2019-03-16 DIAGNOSIS — I700 Atherosclerosis of aorta: Secondary | ICD-10-CM | POA: Diagnosis not present

## 2019-03-16 DIAGNOSIS — I69351 Hemiplegia and hemiparesis following cerebral infarction affecting right dominant side: Secondary | ICD-10-CM | POA: Diagnosis not present

## 2019-03-16 DIAGNOSIS — I82611 Acute embolism and thrombosis of superficial veins of right upper extremity: Secondary | ICD-10-CM | POA: Diagnosis not present

## 2019-03-16 DIAGNOSIS — Z452 Encounter for adjustment and management of vascular access device: Secondary | ICD-10-CM | POA: Diagnosis not present

## 2019-03-16 DIAGNOSIS — L03113 Cellulitis of right upper limb: Secondary | ICD-10-CM | POA: Diagnosis not present

## 2019-03-16 DIAGNOSIS — I69322 Dysarthria following cerebral infarction: Secondary | ICD-10-CM | POA: Diagnosis not present

## 2019-03-16 DIAGNOSIS — T82868D Thrombosis of vascular prosthetic devices, implants and grafts, subsequent encounter: Secondary | ICD-10-CM | POA: Diagnosis not present

## 2019-03-16 DIAGNOSIS — I69391 Dysphagia following cerebral infarction: Secondary | ICD-10-CM | POA: Diagnosis not present

## 2019-03-16 DIAGNOSIS — R419 Unspecified symptoms and signs involving cognitive functions and awareness: Secondary | ICD-10-CM | POA: Diagnosis not present

## 2019-03-16 DIAGNOSIS — I119 Hypertensive heart disease without heart failure: Secondary | ICD-10-CM | POA: Diagnosis not present

## 2019-03-16 NOTE — Progress Notes (Signed)
Carelink Summary Report / Loop Recorder 

## 2019-03-17 DIAGNOSIS — I82611 Acute embolism and thrombosis of superficial veins of right upper extremity: Secondary | ICD-10-CM | POA: Diagnosis not present

## 2019-03-17 DIAGNOSIS — I69322 Dysarthria following cerebral infarction: Secondary | ICD-10-CM | POA: Diagnosis not present

## 2019-03-17 DIAGNOSIS — T82868D Thrombosis of vascular prosthetic devices, implants and grafts, subsequent encounter: Secondary | ICD-10-CM | POA: Diagnosis not present

## 2019-03-17 DIAGNOSIS — I69351 Hemiplegia and hemiparesis following cerebral infarction affecting right dominant side: Secondary | ICD-10-CM | POA: Diagnosis not present

## 2019-03-17 DIAGNOSIS — I69391 Dysphagia following cerebral infarction: Secondary | ICD-10-CM | POA: Diagnosis not present

## 2019-03-17 NOTE — Progress Notes (Signed)
[facility] [address] [place] [address] [phone]       VIRTUAL VISIT FOLLOW UP NOTE  [name] Date of Birth:  [birth year] Medical Record Number:  [id number]   Reason for Referral:  hospital stroke follow up    Virtual Visit via Video Note  I connected with Jon Nelson on 03/18/19 at  9:15 AM EDT by a video enabled telemedicine application located remotely in my own home and verified that I am speaking with the correct person using two identifiers who was located at their own home and accompanied by his sister-in-law, Jon Nelson.   I discussed the limitations of evaluation and management by telemedicine and the availability of in person appointments. The patient expressed understanding and agreed to proceed.   CHIEF COMPLAINT:  Chief Complaint  Patient presents with   Follow-up    Stroke hospital follow-up    HPI: Jon Nelson for in office hospital follow-up regarding scattered left posteriosuperior frontrol and parietal infarcts s/p TPA embolic pattern secondary to unknown source on 01/31/19 but due to COVID-19 safety precautions, visit transition to telemedicine via WebEx. History obtained from patient, sister-in-law and chart review. Reviewed all radiology images and labs personally.  Mr.Jon Nelson a 73 y.o.malewith history of diabetes, HTN, TIA, GI Bleed, HLD, depressionwho presented to [facility] ED with sudden right arm weakness. NIHSS 1. CT head negative. CTA negative for LVO but did show cervical carotid artery atherosclerosis, promient soft plaque in the left carotid bulb, atherosclerosis with mild right and moderate left cavernous ICA stenosis and mod-severe right P1 stenosis and moderate distal left vertebral artery stenosis. He received IV TPA and transferred to [facility] for further evaluation. MRI brain reviewed and showed small scattered foci of acute/early subacute infarction in the left  posterosuperior frontal and parietal lobes inclusive of precentral gyrus embolic pattern secondary to unknown source. Recommended TEE unremarkable therefore loop recorder placed to rule out atrial fibrillation. No intervention indicated for left carotid stenosis but recommended 6 month follow up outpatient with Dr. Donzetta Matters. Finding of right arm basilic vein thrombosis therefore initiated Eliquis for 3 months then will continue on aspirin 325mg  daily for secondary stroke prevention. Was also found to have bacteremia and right upper extremity cellulitis and d/c'd home on IV vanco. HTN stable. LDL 71. No statin at this time due to elevated AST/ALT during admission. Follow up outpatient. A1c 6.6 recommended ongoing f/u with PCP. Discharged home in stable condition with [facility] PT for residual left hemiparesis.   He has been stable from a stroke standpoint with residual deficits of mild right hand weakness.  He has since completed therapies and continues to do exercises on his own.  He is currently living with his brother and sister-in-law but is questioning whether he can return back home at this time along with returning to driving.  He does endorse mild short-term memory loss but this has been stable even prior to the stroke.  He was previously living independently without any difficulty and does have family members who live close to check on him frequently.  He is able to maintain all ADLs without assistance.  No reported difficulties with ambulation and denies any recent falls.  He has continued on Eliquis without side effects of bleeding or bruising.  Continues on atorvastatin 10 mg without side effects of myalgias.  He is not currently monitoring blood pressure at home or glucose levels but sister-in-law plans on assisting him with obtaining devices so this  can be monitored at home.  He did have follow-up visit with PCP 2 days after discharge for ongoing monitoring and management.  They do have scheduled follow-up visit  with PCP in 05/2019.  Loop recorder has not shown atrial fibrillation thus far.  He continues to follow with ID and has since completed IV antibiotics with removal of PICC line for bacteremia.  He denies any residual complications from his prior infection.  No further concerns at this time.  Denies new or worsening stroke/TIA symptoms.   ROS:   14 system review of systems performed and negative with exception of hearing loss, memory loss, and weakness  PMH:  Past Medical History:  Diagnosis Date   Diabetes ([place])    GI bleed    HLD (hyperlipidemia)    HTN (hypertension)    TIA (transient ischemic attack)     PSH:  Past Surgical History:  Procedure Laterality Date   LOOP RECORDER INSERTION N/A 02/03/2019   Procedure: LOOP RECORDER INSERTION;  Surgeon: Constance Haw, MD;  Location: [facility];  Service: Cardiovascular;  Laterality: N/A;   TEE WITHOUT CARDIOVERSION N/A 02/03/2019   Procedure: TRANSESOPHAGEAL ECHOCARDIOGRAM (TEE);  Surgeon: Jerline Pain, MD;  Location: [facility] ENDOSCOPY;  Service: Cardiovascular;  Laterality: N/A;  loop    Social History:  Social History   Socioeconomic History   Marital status: Widowed    Spouse name: Not on file   Number of children: Not on file   Years of education: Not on file   Highest education level: Not on file  Occupational History   Not on file  Social Needs   Financial resource strain: Not on file   Food insecurity:    Worry: Not on file    Inability: Not on file   Transportation needs:    Medical: Not on file    Non-medical: Not on file  Tobacco Use   Smoking status: Never Smoker   Smokeless tobacco: Never Used  Substance and Sexual Activity   Alcohol use: Never    Frequency: Never   Drug use: Never   Sexual activity: Not on file  Lifestyle   Physical activity:    Days per week: Not on file    Minutes per session: Not on file   Stress: Not on file  Relationships   Social connections:     Talks on phone: Not on file    Gets together: Not on file    Attends religious service: Not on file    Active member of club or organization: Not on file    Attends meetings of clubs or organizations: Not on file    Relationship status: Not on file   Intimate partner violence:    Fear of current or ex partner: Not on file    Emotionally abused: Not on file    Physically abused: Not on file    Forced sexual activity: Not on file  Other Topics Concern   Not on file  Social History Narrative   Not on file    Family History: No family history on file.  Medications:   Current Outpatient Medications on File Prior to Visit  Medication Sig Dispense Refill   apixaban (ELIQUIS) 5 MG TABS tablet Take 1 tablet (5 mg total) by mouth 2 (two) times daily. 60 tablet 1   atorvastatin (LIPITOR) 10 MG tablet Take 10 mg by mouth daily.     metFORMIN (GLUCOPHAGE) 500 MG tablet Take 500 mg by mouth every morning.  No current facility-administered medications on file prior to visit.     Allergies:  No Known Allergies   Physical Exam   Depression screen PHQ 2/9 03/18/2019  Decreased Interest 0  Down, Depressed, Hopeless 0  PHQ - 2 Score 0     General: well developed, well nourished, pleasant elderly Caucasian male, seated, in no evident distress Head: head normocephalic and atraumatic.     Neurologic Exam Mental Status: Awake and fully alert. Oriented to place and time. Recent and remote memory intact. Attention span, concentration and fund of knowledge appropriate. Mood and affect appropriate.  Cranial Nerves:  Extraocular movements full without nystagmus. Visual fields full to confrontation. 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  Face, tongue, palate moves normally and symmetrically.  Shoulder shrug symmetric. Motor: No evidence of large muscle group weakness per drift assessment Sensory.: intact to touch , pinprick , position and vibratory sensation.  Coordination: Decreased right  finger dexterity per finger tapping.  Finger-to-nose and heel-to-shin performed accurately bilaterally. Gait and Station: Arises from chair without difficulty. Stance is normal. Gait demonstrates normal stride length and balance. Able to heel, toe and tandem walk without difficulty.  Reflexes: UTA    NIHSS  0 Modified Rankin  1    Diagnostic Data (Labs, Imaging, Testing)  CTA of H/N at [facility]  01/30/2019  No LVO but cervical carotid artery atherosclerosis, prominent soft plaque in the left carotid bulb, atherosclerosis with mild right and moderate left cavernous ICA stenosis and mod-severe right P1 stenosis. Moderate distal left vertebral artery stenosis.   CT HEAD WO CONTRAST  01/30/2019 Was negative for acute event at [facility], moderate small-vessel ischemic changes  Mr Brain Wo Contrast 01/31/2019 1. Small scattered foci of acute/early subacute infarction are present within the left posterosuperior frontal and parietal lobes inclusive of precentral gyrus. No hemorrhage or mass effect. 2. Moderate chronic microvascular ischemic changes and volume loss of the brain. 3. Paranasal sinus disease with fluid levels which may represent acute sinusitis. Partial bilateral mastoid opacification.   Carotid Doppler   02/01/2019 There is 1-39% bilateral ICA stenosis. Vertebral artery flow is antegrade.    Transthoracic Echocardiogram  1. The left ventricle has normal systolic function with an ejection fraction of 60-65%. The cavity size was normal. Left ventricular diastolic Doppler parameters are consistent with impaired relaxation. 2. The right ventricle has normal systolic function. The cavity was normal. There is no increase in right ventricular wall thickness. 3. The mitral valve is normal in structure. 4. The tricuspid valve is normal in structure. 5. The aortic valve is tricuspid. 6. The pulmonic valve was normal in structure. 7. Normal LV systolic function; mild diastolic  dysfunction. 8. The interatrial septum appears to be lipomatous.  TEE 02/03/2019 IMPRESSIONS 1. The left ventricle has normal systolic function, with an ejection fraction of 60-65%. The cavity size was normal. 2. The right ventricle has normal systolc function. The cavity was normal. There is no increase in right ventricular wall thickness. 3. The tricuspid valve was normal in structure. 4. The aortic valve is tricuspid Mild thickening of the aortic valve Mild calcification of the aortic valve. Aortic valve regurgitation is trivial by color flow Doppler. 5. The pulmonic valve was normal in structure. 6. The aortic root is normal in size and structure.    ASSESSMENT: Canuto Kingston is a 73 y.o. year old male here with scattered left posteriosuperior frontal and parietal infarcts s/p tPA embolic pattern on 12/20/12 secondary to undetermined source. Vascular risk factors include DM,  HTN, prior TIA, HLD and intracranial stenosis.  He has been stable from a stroke standpoint with only residual deficit of mild right hand dexterity.     PLAN:  1. Left sided infarcts : Continue Eliquis (apixaban) daily  and atorvastatin 10 mg for secondary stroke prevention.  Advised that he will discontinue Eliquis and initiate aspirin 325 mg daily for secondary stroke prevention after 05/09/2019 as 3 months AC therapy completed.  Maintain strict control of hypertension with blood pressure goal below 130/90, diabetes with hemoglobin A1c goal below 6.5% and cholesterol with LDL cholesterol (bad cholesterol) goal below 70 mg/dL.  I also advised the patient to eat a healthy diet with plenty of whole grains, cereals, fruits and vegetables, exercise regularly with at least 30 minutes of continuous activity daily and maintain ideal body weight.  We will continue to monitor loop recorder to assess for atrial fibrillation 2. HTN: Advised to continue current treatment regimen.  Encouraged obtaining device to monitor at home  and continue to follow with PCP for ongoing management 3. HLD: Advised to continue current treatment regimen along with continued follow-up with PCP for future prescribing and monitoring of lipid panel 4. DMII: Encouraged obtaining device at home to monitor glucose levels and ongoing monitoring/management by PCP 5. Left carotid stenosis: We will follow with vascular surgery Dr. Donzetta Matters 6 months post discharge 6. Right arm basilic vein thrombosis: 3 months AC therapy with Eliquis which will be completed after 05/09/2019 7. As he has recovered very well from a stroke standpoint with only mild residual deficits and no cognitive concerns, I believe at this time he will be safe to return to his home where he lives independently but did recommend frequent monitoring by family.  He was also released to return to driving but did recommend family member riding with him for the first couple times and to avoid main roads, highways or nighttime driving.  Sister-in-law has no concerns regarding these recommendations.    Follow up in 3 months or call earlier if needed   Greater than 50% of time during this 25 minute visit was spent on counseling, explanation of diagnosis of left-sided infarcts, reviewing risk factor management of HTN, HLD, DM, and left carotid stenosis, planning of further management along with potential future management, and discussion with patient and family answering all questions.    Venancio Poisson, AGNP-BC  [facility] [address] [place], [place] [address]  Phone [phone] Fax [phone] Note: This document was prepared with digital dictation and possible smart phrase technology. Any transcriptional errors that result from this process are unintentional.

## 2019-03-18 ENCOUNTER — Other Ambulatory Visit: Payer: Self-pay

## 2019-03-18 ENCOUNTER — Ambulatory Visit (INDEPENDENT_AMBULATORY_CARE_PROVIDER_SITE_OTHER): Payer: Medicare HMO | Admitting: Adult Health

## 2019-03-18 ENCOUNTER — Encounter

## 2019-03-18 ENCOUNTER — Encounter: Payer: Self-pay | Admitting: Adult Health

## 2019-03-18 DIAGNOSIS — I639 Cerebral infarction, unspecified: Secondary | ICD-10-CM | POA: Diagnosis not present

## 2019-03-18 DIAGNOSIS — I10 Essential (primary) hypertension: Secondary | ICD-10-CM | POA: Diagnosis not present

## 2019-03-18 DIAGNOSIS — E785 Hyperlipidemia, unspecified: Secondary | ICD-10-CM | POA: Diagnosis not present

## 2019-03-18 DIAGNOSIS — I6522 Occlusion and stenosis of left carotid artery: Secondary | ICD-10-CM | POA: Diagnosis not present

## 2019-03-18 DIAGNOSIS — E1159 Type 2 diabetes mellitus with other circulatory complications: Secondary | ICD-10-CM | POA: Diagnosis not present

## 2019-03-18 DIAGNOSIS — C44612 Basal cell carcinoma of skin of right upper limb, including shoulder: Secondary | ICD-10-CM | POA: Diagnosis not present

## 2019-03-18 NOTE — Progress Notes (Signed)
I agree with the above plan 

## 2019-03-29 ENCOUNTER — Other Ambulatory Visit: Payer: Self-pay

## 2019-03-29 ENCOUNTER — Ambulatory Visit (INDEPENDENT_AMBULATORY_CARE_PROVIDER_SITE_OTHER): Payer: Medicare HMO | Admitting: *Deleted

## 2019-03-29 DIAGNOSIS — I639 Cerebral infarction, unspecified: Secondary | ICD-10-CM | POA: Diagnosis not present

## 2019-03-29 LAB — CUP PACEART INCLINIC DEVICE CHECK
Date Time Interrogation Session: 20200427164129
Implantable Pulse Generator Implant Date: 20200304

## 2019-03-29 NOTE — Progress Notes (Signed)
Loop check in clinic. Battery status: Good. R-waves 0.85mV.Pause and brady detection off since implant. 0 symptom episodes, 0 tachy episodes, 27 AF episodes--available ECGs all show SR with undersensing and TWOS. Sensing Threshold Decay Delay increased to 351ms to cover t wave oversensing and increased sensitivity to 0.069mV. Reprogrammed AF detection to Less Sensitive and AT/AF recording Threshold to episodes >= 6 minutes. Monthly summary reports and ROV with WC prn

## 2019-04-12 ENCOUNTER — Ambulatory Visit (INDEPENDENT_AMBULATORY_CARE_PROVIDER_SITE_OTHER): Payer: Medicare HMO | Admitting: *Deleted

## 2019-04-12 ENCOUNTER — Other Ambulatory Visit: Payer: Self-pay

## 2019-04-12 DIAGNOSIS — I639 Cerebral infarction, unspecified: Secondary | ICD-10-CM | POA: Diagnosis not present

## 2019-04-12 LAB — CUP PACEART REMOTE DEVICE CHECK
Date Time Interrogation Session: 20200509201107
Implantable Pulse Generator Implant Date: 20200304

## 2019-04-20 NOTE — Progress Notes (Signed)
Carelink Summary Report / Loop Recorder 

## 2019-05-07 ENCOUNTER — Other Ambulatory Visit: Payer: Self-pay

## 2019-05-07 NOTE — Patient Outreach (Signed)
Telephone outreach to patient to obtain mRs was successfully completed. mRs= 0. 

## 2019-05-13 ENCOUNTER — Ambulatory Visit (INDEPENDENT_AMBULATORY_CARE_PROVIDER_SITE_OTHER): Payer: Medicare HMO | Admitting: *Deleted

## 2019-05-13 DIAGNOSIS — I639 Cerebral infarction, unspecified: Secondary | ICD-10-CM | POA: Diagnosis not present

## 2019-05-14 LAB — CUP PACEART REMOTE DEVICE CHECK
Date Time Interrogation Session: 20200611201005
Implantable Pulse Generator Implant Date: 20200304

## 2019-05-18 DIAGNOSIS — I69331 Monoplegia of upper limb following cerebral infarction affecting right dominant side: Secondary | ICD-10-CM | POA: Diagnosis not present

## 2019-05-18 DIAGNOSIS — E782 Mixed hyperlipidemia: Secondary | ICD-10-CM | POA: Diagnosis not present

## 2019-05-18 DIAGNOSIS — E119 Type 2 diabetes mellitus without complications: Secondary | ICD-10-CM | POA: Diagnosis not present

## 2019-05-18 DIAGNOSIS — E1169 Type 2 diabetes mellitus with other specified complication: Secondary | ICD-10-CM | POA: Diagnosis not present

## 2019-05-18 DIAGNOSIS — I6523 Occlusion and stenosis of bilateral carotid arteries: Secondary | ICD-10-CM | POA: Diagnosis not present

## 2019-05-19 NOTE — Progress Notes (Signed)
Carelink Summary Report / Loop Recorder 

## 2019-06-15 ENCOUNTER — Ambulatory Visit (INDEPENDENT_AMBULATORY_CARE_PROVIDER_SITE_OTHER): Payer: Medicare HMO | Admitting: *Deleted

## 2019-06-15 DIAGNOSIS — I639 Cerebral infarction, unspecified: Secondary | ICD-10-CM | POA: Diagnosis not present

## 2019-06-16 LAB — CUP PACEART REMOTE DEVICE CHECK
Date Time Interrogation Session: 20200714203640
Implantable Pulse Generator Implant Date: 20200304

## 2019-06-18 ENCOUNTER — Telehealth: Payer: Self-pay

## 2019-06-18 NOTE — Telephone Encounter (Signed)
Left message for patient regarding disconnected monitor.  

## 2019-06-22 ENCOUNTER — Telehealth: Payer: Self-pay | Admitting: Adult Health

## 2019-06-22 NOTE — Telephone Encounter (Signed)
I called patient to confirm his 7/22 appt. I spoke with patient's POA who requested that we cancel this appt, and did not give a reason. Appt has been cancelled and I advised them to call back if needed.

## 2019-06-23 ENCOUNTER — Ambulatory Visit: Payer: Medicare HMO | Admitting: Adult Health

## 2019-06-29 NOTE — Progress Notes (Signed)
Carelink Summary Report / Loop Recorder 

## 2019-07-19 ENCOUNTER — Ambulatory Visit (INDEPENDENT_AMBULATORY_CARE_PROVIDER_SITE_OTHER): Payer: Medicare HMO | Admitting: *Deleted

## 2019-07-19 DIAGNOSIS — I639 Cerebral infarction, unspecified: Secondary | ICD-10-CM

## 2019-07-19 LAB — CUP PACEART REMOTE DEVICE CHECK
Date Time Interrogation Session: 20200816204008
Implantable Pulse Generator Implant Date: 20200304

## 2019-07-23 ENCOUNTER — Telehealth: Payer: Self-pay

## 2019-07-23 NOTE — Telephone Encounter (Signed)
Left message for patient regarding disconnected monitor.  

## 2019-07-27 NOTE — Progress Notes (Signed)
Carelink Summary Report / Loop Recorder 

## 2019-08-05 ENCOUNTER — Encounter: Payer: Self-pay | Admitting: Cardiology

## 2019-08-20 ENCOUNTER — Encounter: Payer: Medicare HMO | Admitting: *Deleted

## 2019-08-26 ENCOUNTER — Encounter: Payer: Self-pay | Admitting: Cardiology

## 2019-08-30 DIAGNOSIS — E782 Mixed hyperlipidemia: Secondary | ICD-10-CM | POA: Diagnosis not present

## 2019-08-30 DIAGNOSIS — E1169 Type 2 diabetes mellitus with other specified complication: Secondary | ICD-10-CM | POA: Diagnosis not present

## 2019-08-30 DIAGNOSIS — I6523 Occlusion and stenosis of bilateral carotid arteries: Secondary | ICD-10-CM | POA: Diagnosis not present

## 2019-08-30 DIAGNOSIS — Z8673 Personal history of transient ischemic attack (TIA), and cerebral infarction without residual deficits: Secondary | ICD-10-CM | POA: Diagnosis not present

## 2019-08-30 DIAGNOSIS — Z23 Encounter for immunization: Secondary | ICD-10-CM | POA: Diagnosis not present

## 2019-11-30 DIAGNOSIS — Z8673 Personal history of transient ischemic attack (TIA), and cerebral infarction without residual deficits: Secondary | ICD-10-CM | POA: Diagnosis not present

## 2019-11-30 DIAGNOSIS — R413 Other amnesia: Secondary | ICD-10-CM | POA: Diagnosis not present

## 2019-11-30 DIAGNOSIS — Z6827 Body mass index (BMI) 27.0-27.9, adult: Secondary | ICD-10-CM | POA: Diagnosis not present

## 2019-11-30 DIAGNOSIS — E1169 Type 2 diabetes mellitus with other specified complication: Secondary | ICD-10-CM | POA: Diagnosis not present

## 2019-11-30 DIAGNOSIS — I6523 Occlusion and stenosis of bilateral carotid arteries: Secondary | ICD-10-CM | POA: Diagnosis not present

## 2019-11-30 DIAGNOSIS — E782 Mixed hyperlipidemia: Secondary | ICD-10-CM | POA: Diagnosis not present

## 2019-12-03 HISTORY — PX: HERNIA REPAIR: SHX51

## 2020-03-06 IMAGING — US US EXTREM UP *R* LTD
1 series · 10 of 10 positions shown · non-contrast
Comparison: None.

CLINICAL DATA: Possible abscess, right arm. Status post IV. Right
antecubital redness.

EXAM:
ULTRASOUND RIGHT UPPER EXTREMITY LIMITED
TECHNIQUE: Ultrasound examination of the upper extremity soft tissues was
performed in the area of clinical concern.

[Series 1: us extrem up *right* ltd · 0.05mm/px · 10 of 10 slices shown]
[im 1/10]
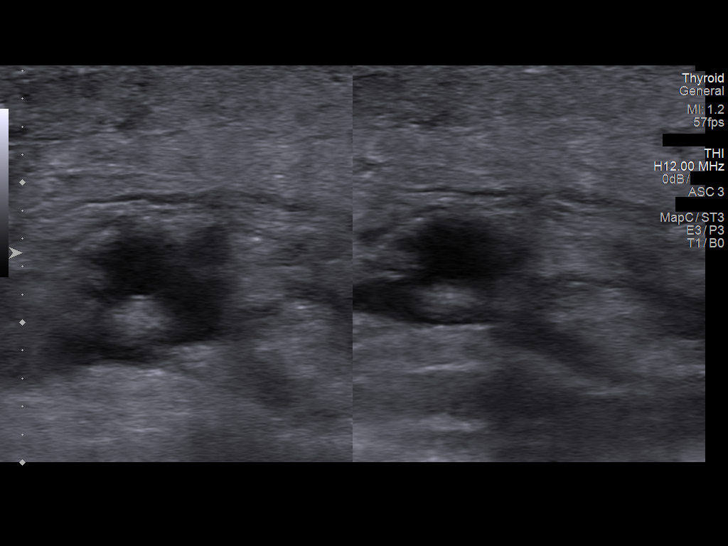
[im 2/10]
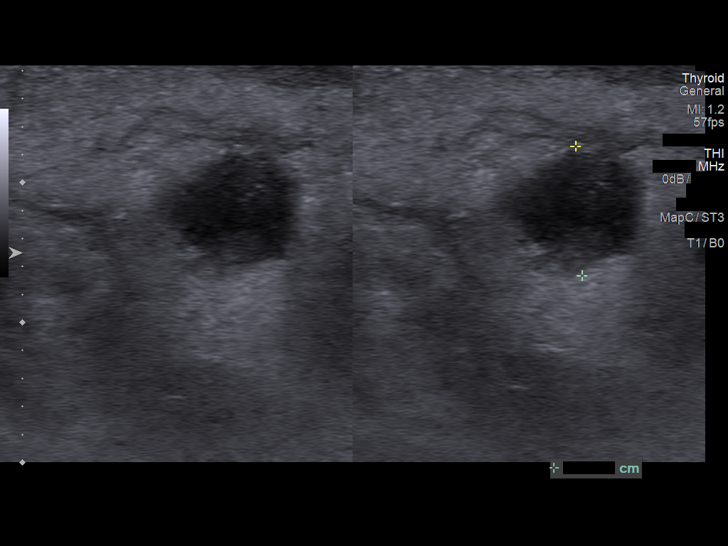
[im 3/10]
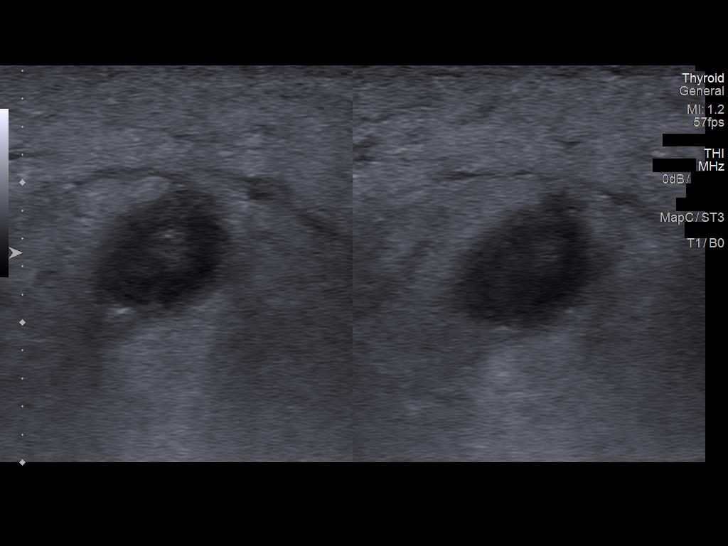
[im 4/10]
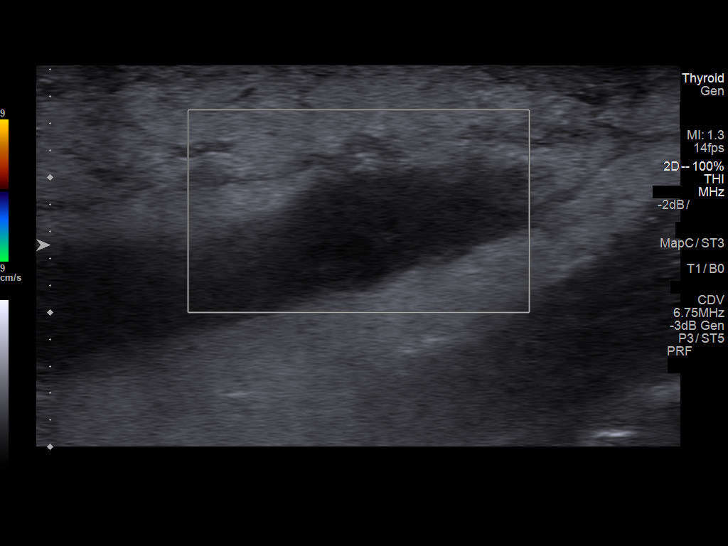
[im 5/10]
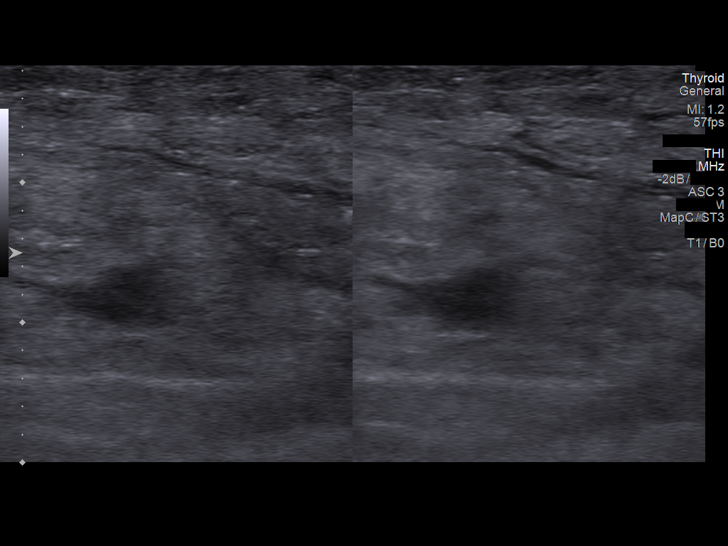
[im 6/10]
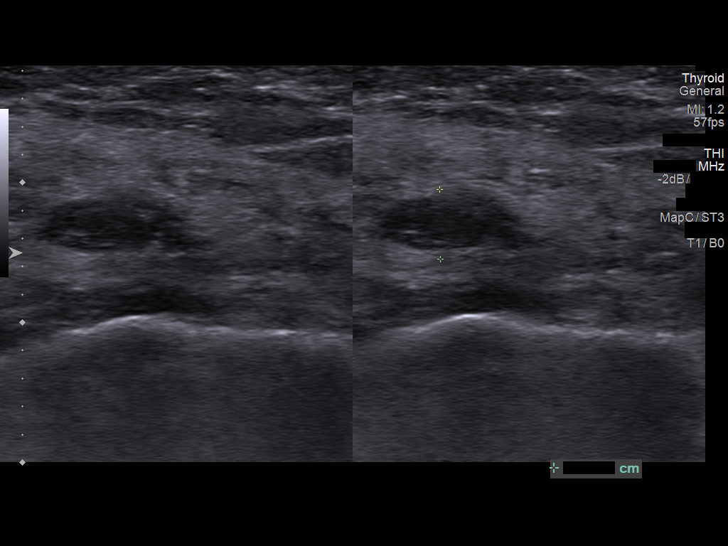
[im 7/10]
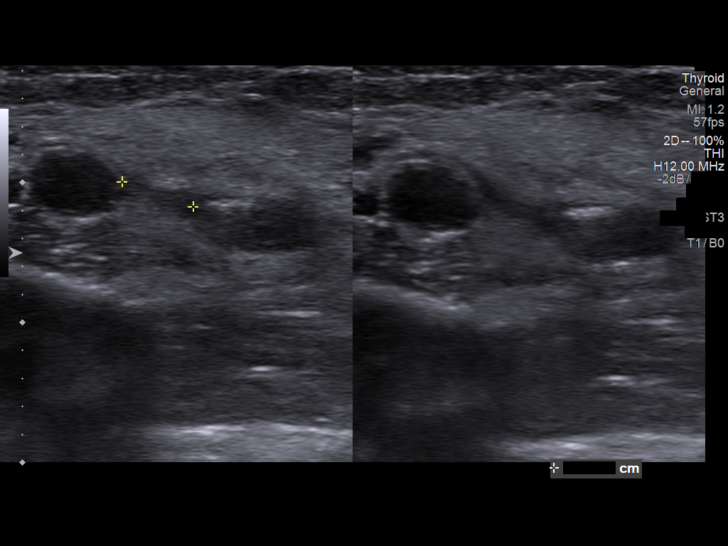
[im 8/10]
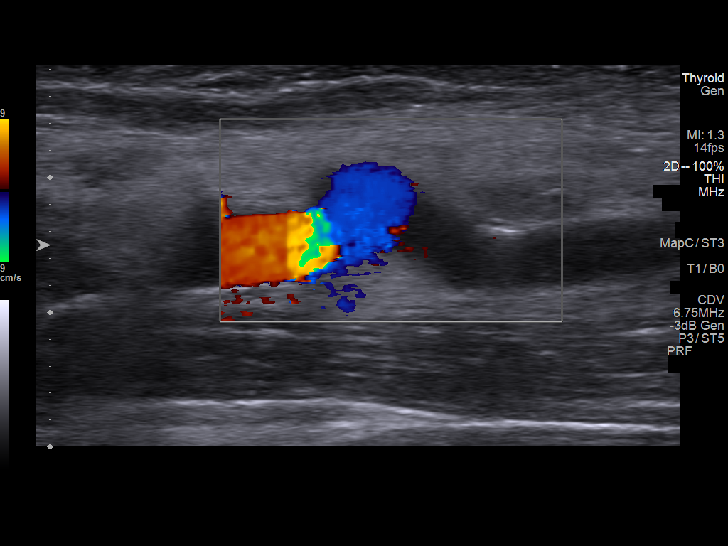
[im 9/10]
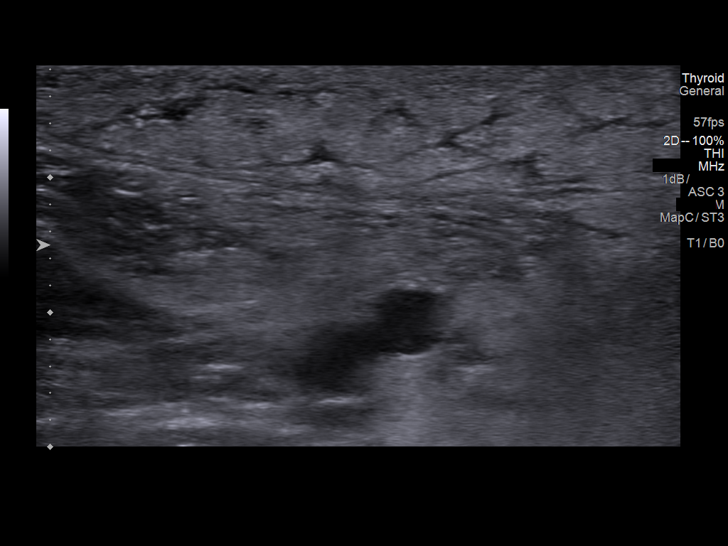
[im 10/10]
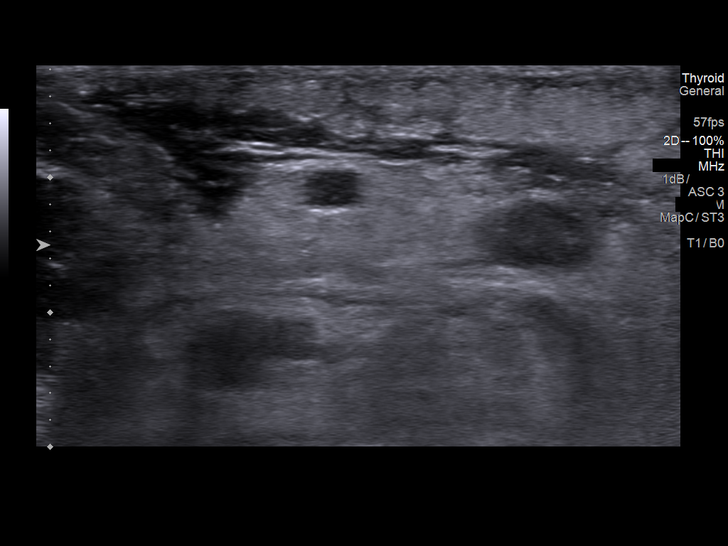

[10 of 10 positions shown; findings below may reference images not displayed]

FINDINGS: There is thrombus in the basilic vein extending from the upper arm
across the antecubital fossa, beginning just below the axillary vein
confluence. There is associated diffuse soft tissue edema with some
increased echogenicity of the subcutaneous fat consistent with
inflammation. However, there is no abscess. No mass.
IMPRESSION: 1. No abscess.
2. Acute thrombosis in the basilic vein with associated soft tissue
edema and inflammation.

## 2020-03-28 ENCOUNTER — Encounter: Payer: Self-pay | Admitting: Family Medicine

## 2020-03-28 NOTE — Progress Notes (Signed)
Subjective:  Patient ID: Jon Nelson, male    DOB: Jul 15, 1946  Age: 74 y.o. MRN: [id number]  Chief Complaint  Patient presents with  . Diabetes  . Hyperlipidemia  . Hypertension    HPI Patient to be evaluated for type 2 diabetes mellitus with other specified complication.  Specifically, this is type 2, non-insulin requiring diabetes, complicated by hyperlipidemia and carotid artery stenosis.  Compliance with treatment has been good; he takes his medication as directed, maintains his exercise regimen, and follows up as directed.  He denies experiencing any diabetes related symptoms.  Depression screen is performed and is negative.  Current meds include an oral hypoglycemic ( Glucophage ( 500mg  qd ) ) and lipid lowering agent ( Lipitor ).  He does not perform home blood glucose monitoring.  Most recent lab results include glycohemoglobin 6.4%.  His most recent systolic blood pressure was < 130 mmHg. The last diastolic blood pressure was < 80 mmHg. In regard to preventative care, his last ophthalmology exam was in 2006.  Concurrent health problems include hypercholesterolemia.      Pt presents with hyperlipidemia.  Date of diagnosis 10/2017.  Current treatment includes Lipitor.  Compliance with treatment has been fair; he skips some medication doses due to forgetfulness.  He denies experiencing any hypercholesterolemia related symptoms.   Concurrent health problems include diabetes.      Jon Nelson presents with a diagnosis of occlusion and stenosis of bilateral carotid arteries.  This was diagnosed 02/01/2019.  The course has been stable and nonprogressive. He is currently on aspirin and lipitor.  He denies chest pain, cough, headache and palpitations.  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       Past Medical History:  Diagnosis Date  . Carotid artery stenosis   . Diabetes ([place])   . GI bleed   . HLD (hyperlipidemia)   . HTN (hypertension)   . Other amnesia   .  TIA (transient ischemic attack)    Past Surgical History:  Procedure Laterality Date  . HEMORRHOID SURGERY    . LOOP RECORDER INSERTION N/A 02/03/2019   Procedure: LOOP RECORDER INSERTION;  Surgeon: Constance Haw, MD;  Location: [place] [facility];  Service: Cardiovascular;  Laterality: N/A;  . TEE WITHOUT CARDIOVERSION N/A 02/03/2019   Procedure: TRANSESOPHAGEAL ECHOCARDIOGRAM (TEE);  Surgeon: Jerline Pain, MD;  Location: [facility] ENDOSCOPY;  Service: Cardiovascular;  Laterality: N/A;  loop    Family History  Problem Relation Age of Onset  . Stroke Mother    Social History   Socioeconomic History  . Marital status: Widowed    Spouse name: Not on file  . Number of children: Not on file  . Years of education: Not on file  . Highest education level: Not on file  Occupational History  . Occupation: retired  Tobacco Use  . Smoking status: Never Smoker  . Smokeless tobacco: Never Used  Substance and Sexual Activity  . Alcohol use: Never  . Drug use: Never  . Sexual activity: Not on file  Other Topics Concern  . Not on file  Social History Narrative  . Not on file   Social Determinants of Health   Financial Resource Strain:   . Difficulty of Paying Living Expenses:   Food Insecurity:   . Worried About Charity fundraiser in the Last Year:   . Arboriculturist in the Last Year:   Transportation Needs:   . Film/video editor (Medical):   Marland Kitchen  Lack of Transportation (Non-Medical):   Physical Activity:   . Days of Exercise per Week:   . Minutes of Exercise per Session:   Stress:   . Feeling of Stress :   Social Connections:   . Frequency of Communication with Friends and Family:   . Frequency of Social Gatherings with Friends and Family:   . Attends Religious Services:   . Active Member of Clubs or Organizations:   . Attends Archivist Meetings:   Marland Kitchen Marital Status:     Review of Systems  Constitutional: Negative for chills, diaphoresis, fatigue and fever.    HENT: Negative for congestion, ear pain and sore throat.   Respiratory: Negative for cough and shortness of breath.   Cardiovascular: Negative for chest pain and leg swelling.  Gastrointestinal: Negative for abdominal pain, constipation, diarrhea, nausea and vomiting.  Genitourinary: Negative for dysuria and urgency.  Musculoskeletal: Negative for arthralgias and myalgias.       Dysfunction of motor function of right hand since stroke.  Neurological: Negative for dizziness and headaches.  Psychiatric/Behavioral: Positive for confusion (memory issues since his stroke. His MRI showed the stroke, but also some moderate generalized shrinking.). Negative for dysphoric mood.     Objective:  BP 120/80   Pulse 96   Temp 97.9 F (36.6 C)   Resp 16   Ht 5\' 8"  (1.727 m)   Wt 184 lb (83.5 kg)   BMI 27.98 kg/m   BP/Weight 03/30/2020 02/18/2019 [id number]  Systolic BP 123456 [id number]  Diastolic BP 80 74 92  Wt. (Lbs) 184 180 -  BMI 27.98 26.58 -    Physical Exam Constitutional:      Appearance: Normal appearance.  Eyes:     Conjunctiva/sclera: Conjunctivae normal.     Comments: Retina: only partially visualized due to cataracts.  Cardiovascular:     Rate and Rhythm: Normal rate and regular rhythm.     Pulses: Normal pulses.     Heart sounds: Normal heart sounds.  Pulmonary:     Effort: Pulmonary effort is normal. No respiratory distress.     Breath sounds: Normal breath sounds.  Abdominal:     General: Abdomen is flat. Bowel sounds are normal.     Palpations: Abdomen is soft.  Neurological:     Mental Status: He is alert and oriented to person, place, and time.  Psychiatric:        Mood and Affect: Mood normal.        Behavior: Behavior normal.    Diabetic Foot Exam - Simple   Simple Foot Form Diabetic Foot exam was performed with the following findings: Yes   Visual Inspection No deformities, no ulcerations, no other skin breakdown bilaterally: Yes Sensation Testing Intact to  touch and monofilament testing bilaterally: Yes Pulse Check Posterior Tibialis and Dorsalis pulse intact bilaterally: Yes Comments       Assessment & Plan:  1. Essential hypertension Well controlled.  No changes to medicines.  Continue to work on eating a healthy diet and exercise.  Labs drawn today.  - Comprehensive metabolic panel - CBC with Differential/Platelet  2. Mixed hyperlipidemia Well controlled.  No changes to medicines.  Continue to work on eating a healthy diet and exercise.  Labs drawn today.  - Lipid panel  3. Diabetic nephropathy associated with type 2 diabetes mellitus ([place]) Control: good Recommend check sugars fasting daily. Recommend check feet daily. Recommend annual eye exams. Medicines: start on lisinopril 5 mg once daily. Continue to work  on eating a healthy diet and exercise.  Labs drawn today.   - POCT UA - Microalbumin - Hemoglobin A1c  4. Memory loss Trial on aricept 5 mg once at night. Follow up in 1 month. - B12 and Folate Panel  5. 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  6. Bilateral carotid artery stenosis Continue aspirin and lipitor.   7. History of Holter monitoring Pt has a loop recorder.  8. Age-related cataract of both eyes, unspecified age-related cataract type Eye exam suspicious for cataracts. - Ambulatory referral to Ophthalmology  Meds ordered this encounter  Medications  . lisinopril (ZESTRIL) 5 MG tablet    Sig: Take 1 tablet (5 mg total) by mouth daily.    Dispense:  90 tablet    Refill:  0  . donepezil (ARICEPT) 5 MG tablet    Sig: Take 1 tablet (5 mg total) by mouth at bedtime.    Dispense:  30 tablet    Refill:  0    Orders Placed This Encounter  Procedures  . Lipid panel  . Hemoglobin A1c  . Comprehensive metabolic panel  . CBC with Differential/Platelet  . B12 and Folate Panel  . POCT UA - Microalbumin     Follow-up: Return in  about 4 weeks (around 04/27/2020) for memory.  An After Visit Summary was printed and given to the patient.  Rochel Brome [facility] [phone]

## 2020-03-30 ENCOUNTER — Encounter: Payer: Self-pay | Admitting: Family Medicine

## 2020-03-30 ENCOUNTER — Other Ambulatory Visit: Payer: Self-pay

## 2020-03-30 ENCOUNTER — Ambulatory Visit (INDEPENDENT_AMBULATORY_CARE_PROVIDER_SITE_OTHER): Payer: Medicare HMO | Admitting: Family Medicine

## 2020-03-30 VITALS — BP 120/80 | HR 96 | Temp 97.9°F | Resp 16 | Ht 68.0 in | Wt 184.0 lb

## 2020-03-30 DIAGNOSIS — R413 Other amnesia: Secondary | ICD-10-CM | POA: Diagnosis not present

## 2020-03-30 DIAGNOSIS — I10 Essential (primary) hypertension: Secondary | ICD-10-CM

## 2020-03-30 DIAGNOSIS — Z9889 Other specified postprocedural states: Secondary | ICD-10-CM | POA: Diagnosis not present

## 2020-03-30 DIAGNOSIS — H259 Unspecified age-related cataract: Secondary | ICD-10-CM

## 2020-03-30 DIAGNOSIS — I679 Cerebrovascular disease, unspecified: Secondary | ICD-10-CM

## 2020-03-30 DIAGNOSIS — E782 Mixed hyperlipidemia: Secondary | ICD-10-CM | POA: Diagnosis not present

## 2020-03-30 DIAGNOSIS — E1121 Type 2 diabetes mellitus with diabetic nephropathy: Secondary | ICD-10-CM | POA: Diagnosis not present

## 2020-03-30 DIAGNOSIS — I6523 Occlusion and stenosis of bilateral carotid arteries: Secondary | ICD-10-CM | POA: Diagnosis not present

## 2020-03-30 DIAGNOSIS — G8191 Hemiplegia, unspecified affecting right dominant side: Secondary | ICD-10-CM | POA: Insufficient documentation

## 2020-03-30 MED ORDER — LISINOPRIL 5 MG PO TABS: 5.0000 mg | ORAL_TABLET | Freq: Every day | ORAL | 0 refills | Status: DC

## 2020-03-30 MED ORDER — DONEPEZIL HCL 5 MG PO TABS: 5.0000 mg | ORAL_TABLET | Freq: Every day | ORAL | 0 refills | Status: DC

## 2020-03-30 NOTE — Patient Instructions (Addendum)
Diabetes  Spilling protein in your urine.  Start on lisinopril 5 mg once daily.  This is also a blood pressure medicine however your blood pressure is well controlled. Memory loss-start Aricept 5 mg once at night. Patient to return in 1 month. Carotid artery narrowing: Ordering carotid ultrasound or will speak with vascular surgeon schedule an appointment there.

## 2020-04-01 ENCOUNTER — Encounter: Payer: Self-pay | Admitting: Family Medicine

## 2020-04-01 LAB — POCT UA - MICROALBUMIN: Microalbumin Ur, POC: 150 mg/L

## 2020-04-06 IMAGING — MR MR HEAD W/O CM
9 of 10 series · 34 of 48 positions shown · non-contrast
Comparison: None.

CLINICAL DATA: 73 y/o  M; right arm weakness.  Post tPA.

EXAM:
MRI HEAD WITHOUT CONTRAST
TECHNIQUE: Multiplanar, multiecho pulse sequences of the brain and surrounding
structures were obtained without intravenous contrast.

[Series 3: DWI · coronal · 4.0mm · 0.94mm/px · 6 of 72 slices shown (1 of 2)]
[im 1/72]
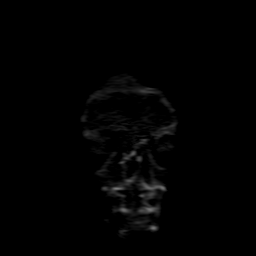
[im 15/72]
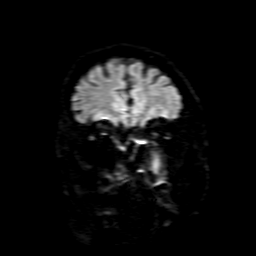
[im 29/72]
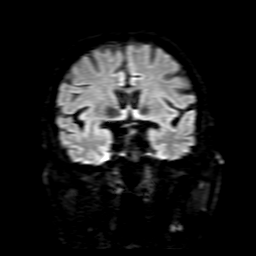
[im 43/72]
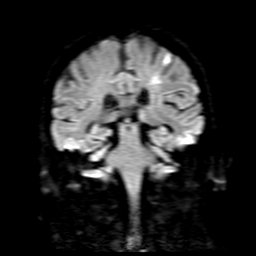
[im 57/72]
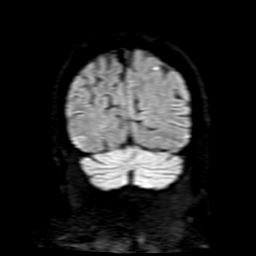
[im 72/72]
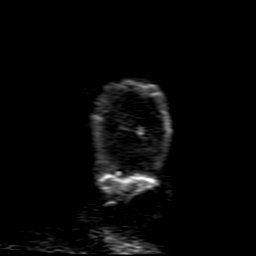

[Series 4: DWI · axial · 3.0mm · 0.94mm/px · z∈[-159,+11]mm · 8 of 115 slices shown (2 of 2)]
[im 1/115]
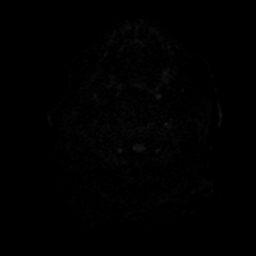
[im 13/115]
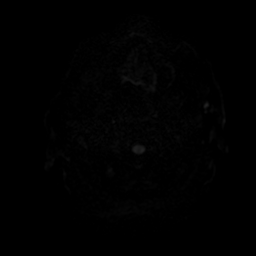
[im 39/115]
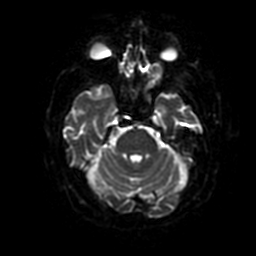
[im 51/115]
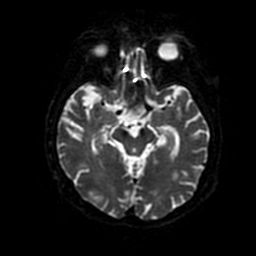
[im 64/115]
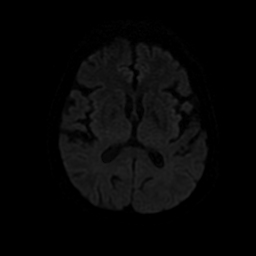
[im 77/115]
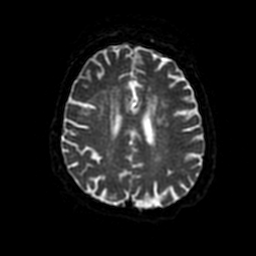
[im 102/115]
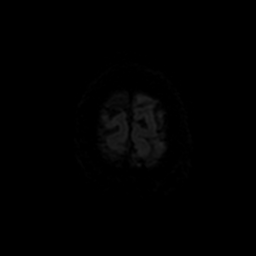
[im 115/115]
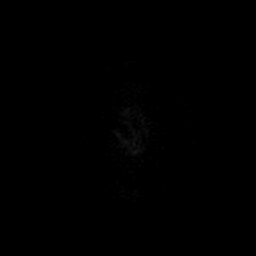

[Series 5: FLAIR · sagittal · 5.0mm · 0.47mm/px · 2 of 27 slices shown (1 of 2)]
[im 1/27]
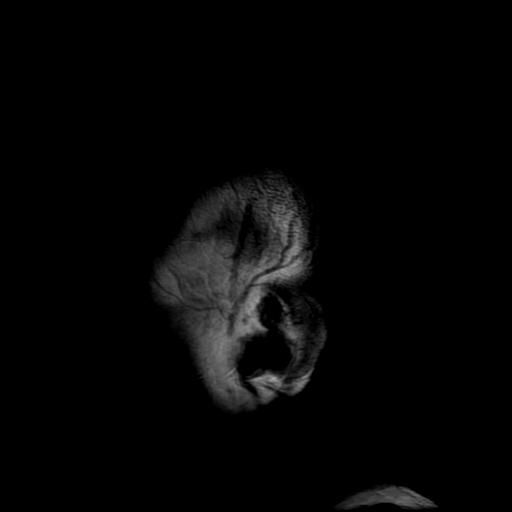
[im 27/27]
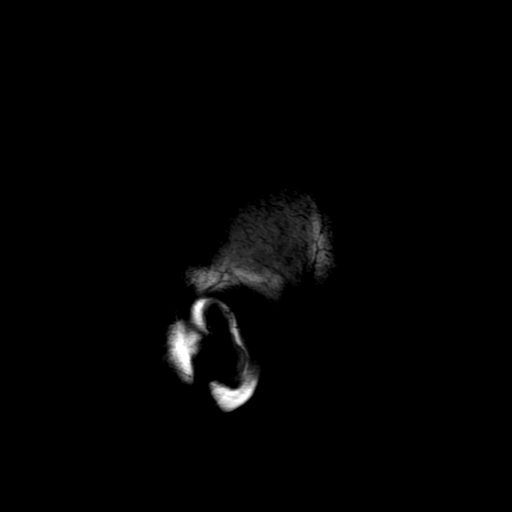

[Series 7: T2 · axial · 5.0mm · 0.47mm/px · z∈[-157,+9]mm · 2 of 29 slices shown]
[im 1/29]
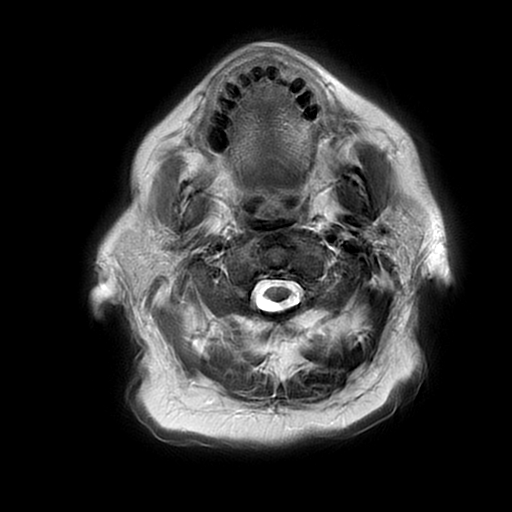
[im 29/29]
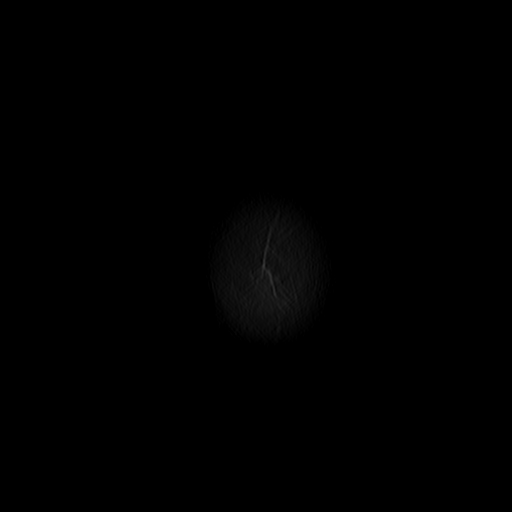

[Series 8: FLAIR · axial · 3.0mm · 0.47mm/px · z∈[-157,+9]mm · 2 of 29 slices shown (2 of 2)]
[im 1/29]
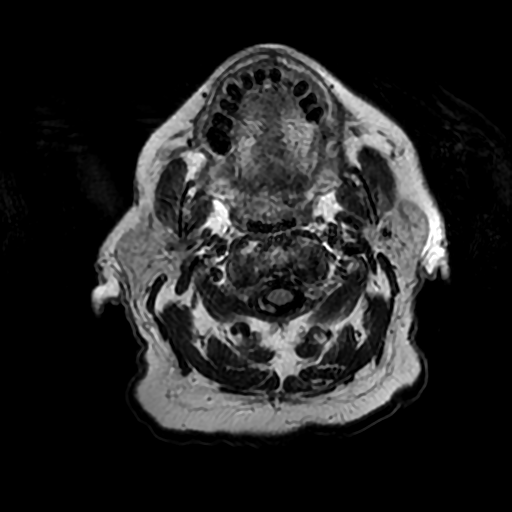
[im 29/29]
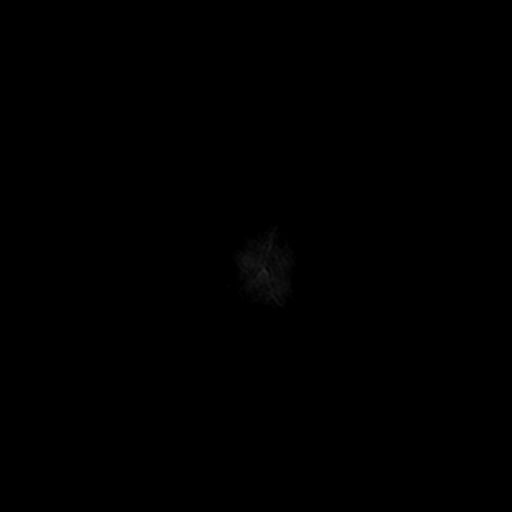

[Series 9: (person_name) · axial · 3.0mm · 0.47mm/px · z∈[-159,-102]mm · 3 of 116 slices shown]
[im 1/116]
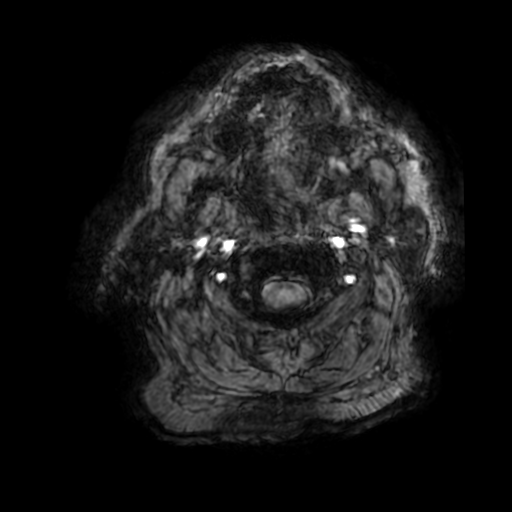
[im 13/116]
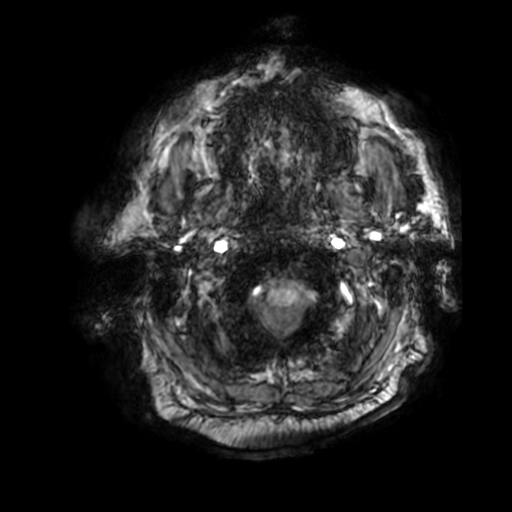
[im 39/116]
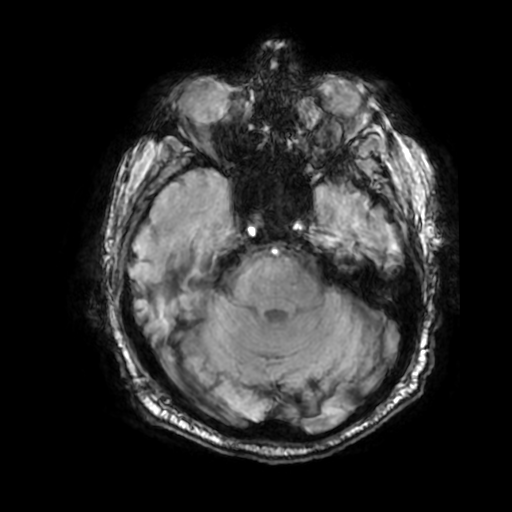

[Series 11: T2 post-contrast · coronal · 5.0mm · 0.47mm/px · 3 of 30 slices shown]
[im 1/30]
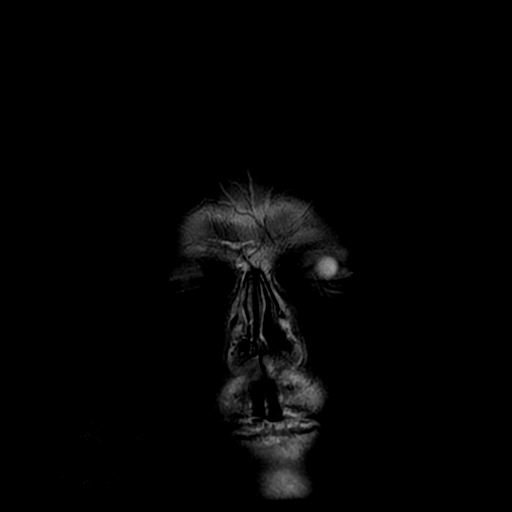
[im 15/30]
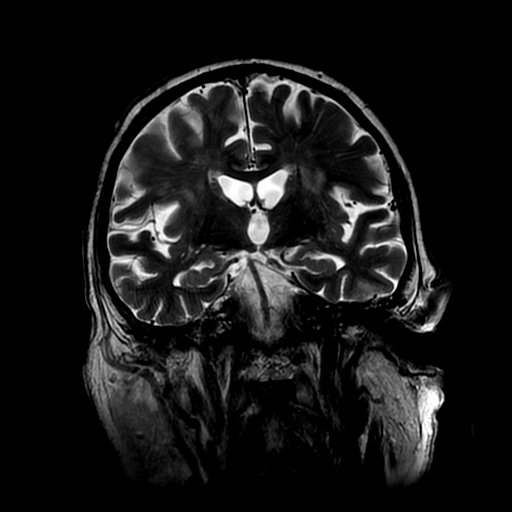
[im 30/30]
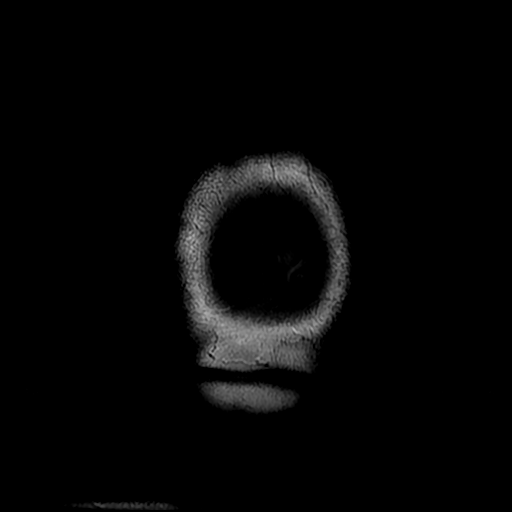

[Series 350: ADC · coronal · 4.0mm · 0.94mm/px · 3 of 36 slices shown (1 of 2)]
[im 1/36]
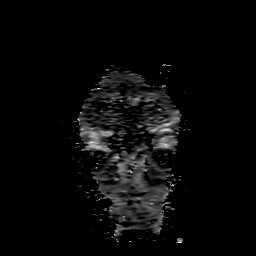
[im 18/36]
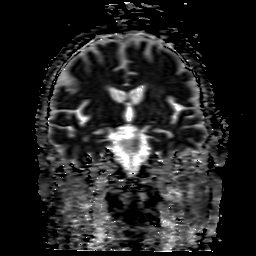
[im 36/36]
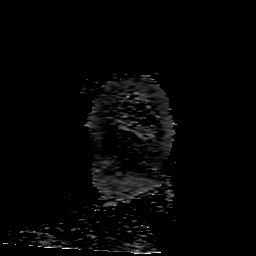

[Series 450: ADC · axial · 3.0mm · 0.94mm/px · z∈[-159,+11]mm · 5 of 58 slices shown (2 of 2)]
[im 1/58]
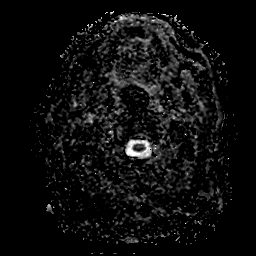
[im 15/58]
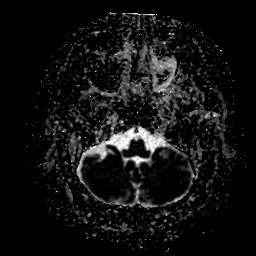
[im 29/58]
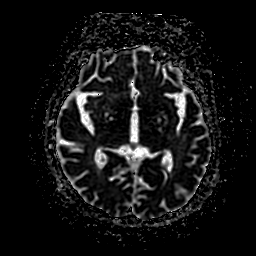
[im 43/58]
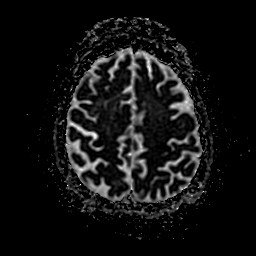
[im 58/58]
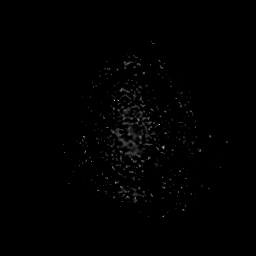

[34 of 48 positions shown; findings below may reference images not displayed]

FINDINGS: Brain: Small scattered foci of reduced diffusion are present within
the left posterosuperior frontal and parietal lobes inclusive of the
precentral gyrus compatible with acute/early subacute infarction.
Foci of infarction demonstrate T2 FLAIR hyperintense signal. No
abnormal susceptibility hypointensity to indicate intracranial
hemorrhage.

Early confluent nonspecific T2 FLAIR hyperintensities in subcortical
and periventricular white matter are compatible with moderate
chronic microvascular ischemic changes. Moderate volume loss of the
brain. No extra-axial collection, hydrocephalus, mass effect, or
herniation.

Vascular: Normal flow voids.

Skull and upper cervical spine: Normal marrow signal.

Sinuses/Orbits: Extensive paranasal sinus disease with fluid levels.
Partial opacification of the mastoid air cells. Orbits are
unremarkable.

Other: None.
IMPRESSION: 1. Small scattered foci of acute/early subacute infarction are
present within the left posterosuperior frontal and parietal lobes
inclusive of precentral gyrus. No hemorrhage or mass effect.
2. Moderate chronic microvascular ischemic changes and volume loss
of the brain.
3. Paranasal sinus disease with fluid levels which may represent
acute sinusitis. Partial bilateral mastoid opacification.

These results will be called to the ordering clinician or
representative by the Radiologist Assistant, and communication
documented in the PACS or zVision Dashboard.

## 2020-04-10 IMAGING — DX DG ABDOMEN 1V
1 series · 1 of 1 positions shown · non-contrast
Comparison: None.

CLINICAL DATA: Nausea and vomiting

EXAM:
ABDOMEN - 1 VIEW

[abdomen kub]
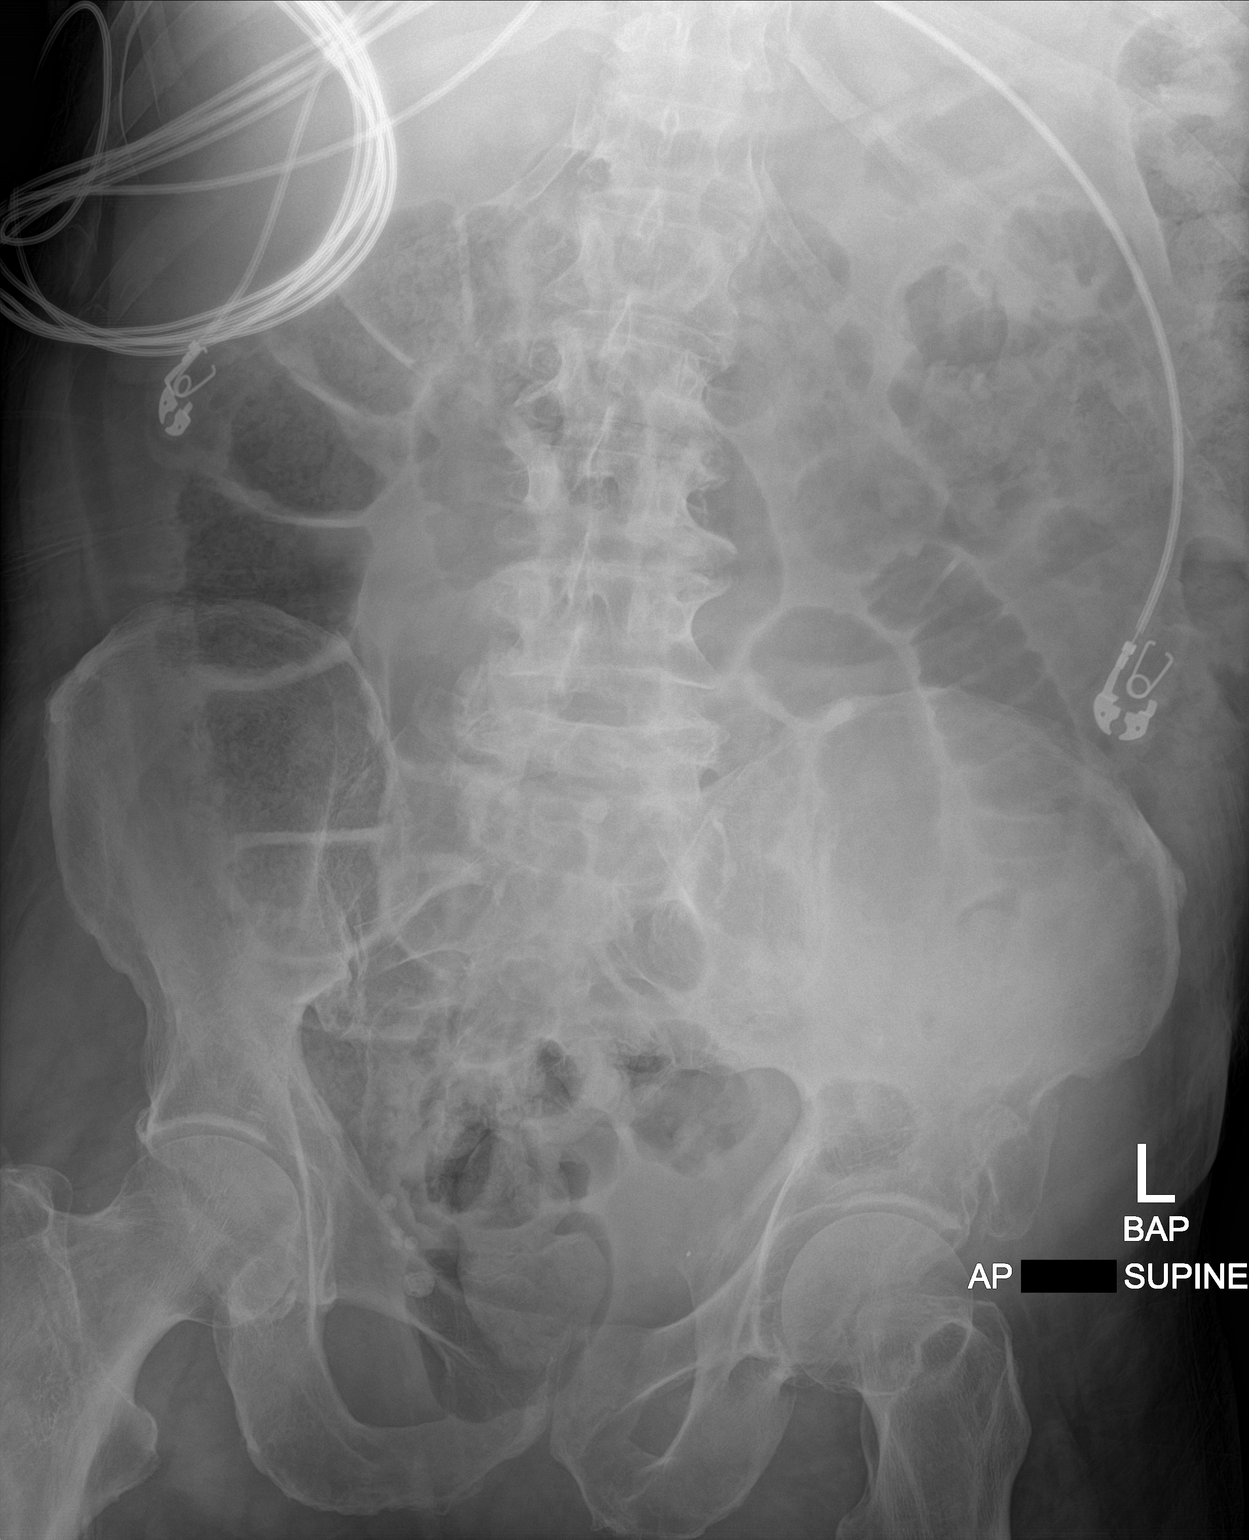

[1 of 1 positions shown; findings below may reference images not displayed]

FINDINGS: Diffuse increased small and large bowel gas. Moderate stool in the
colon. No radiopaque calculi.
IMPRESSION: Mild diffuse increased bowel gas throughout, possible ileus.

## 2020-04-10 IMAGING — DX DG CHEST 1V PORT
1 series · 1 of 1 positions shown · non-contrast
Comparison: None.

CLINICAL DATA: Pneumonia

EXAM:
PORTABLE CHEST 1 VIEW

[chest ap]
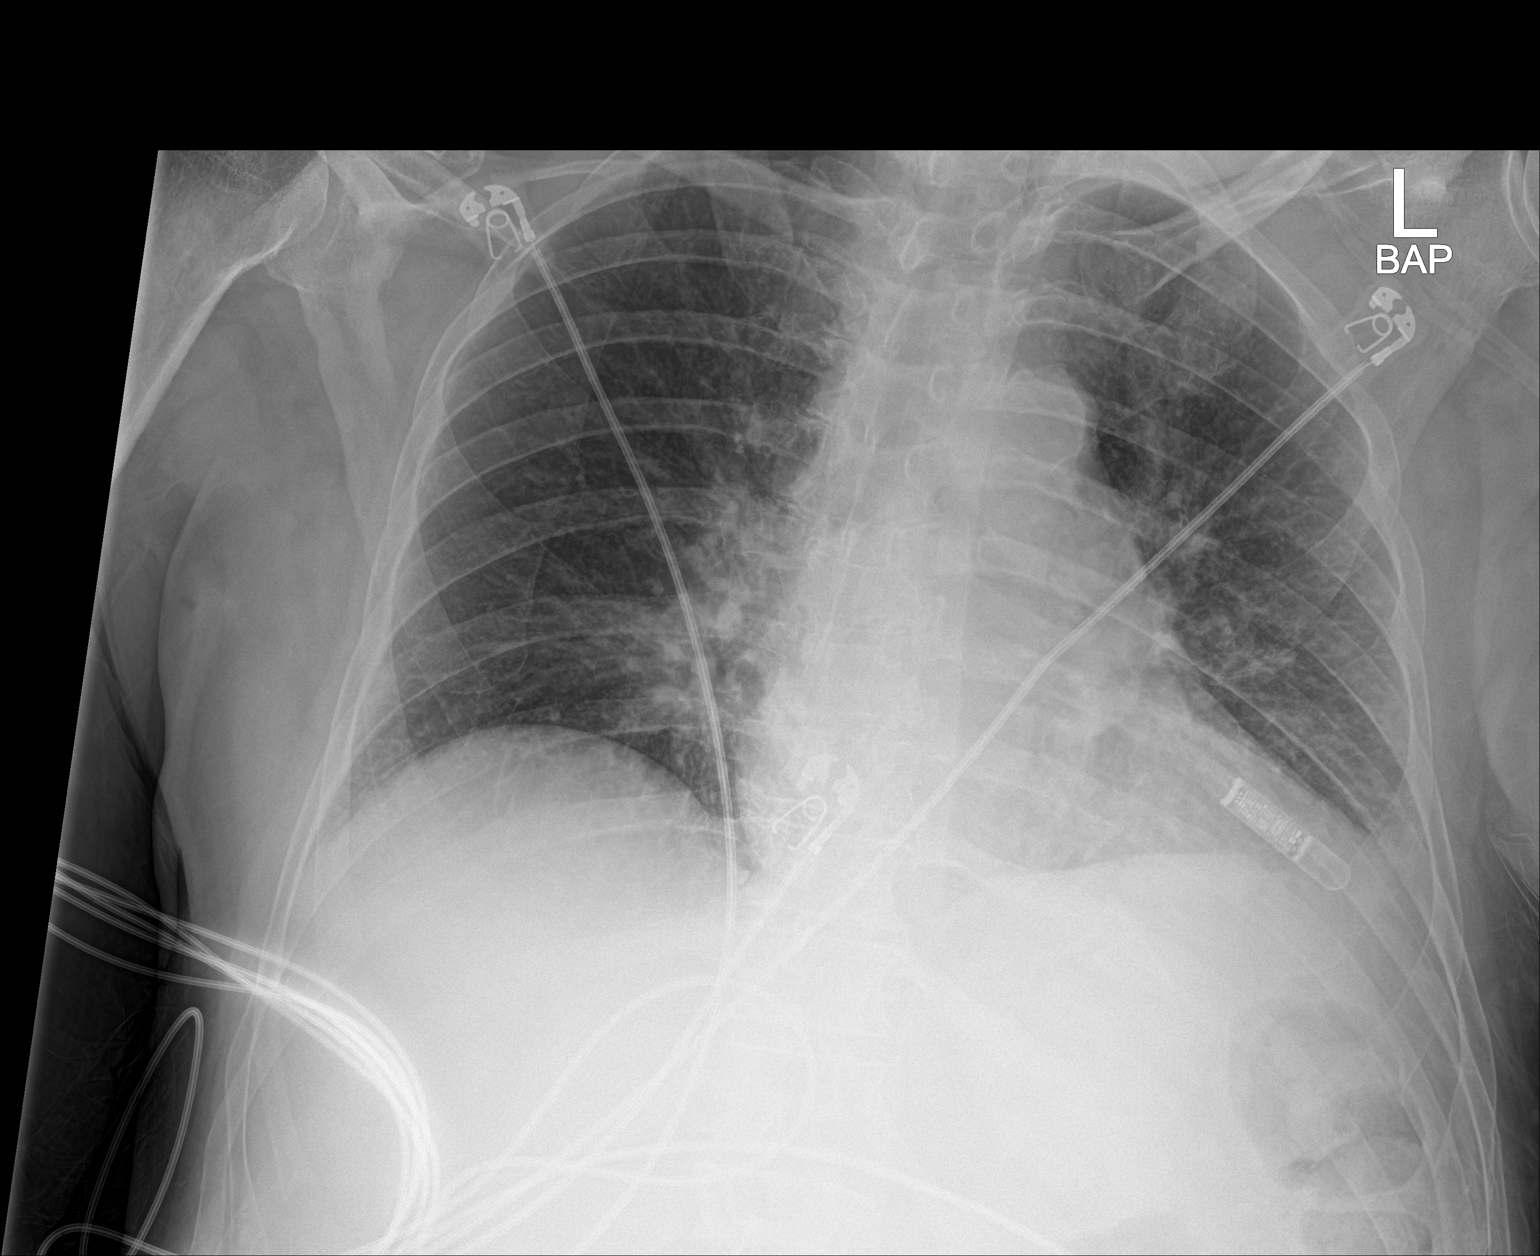

[1 of 1 positions shown; findings below may reference images not displayed]

FINDINGS: Right lung is clear. Borderline cardiomegaly. Patchy atelectasis or
minimal infiltrate at the left base. Aortic atherosclerosis.
IMPRESSION: Patchy atelectasis or minimal infiltrate at the left base

## 2020-04-25 NOTE — Progress Notes (Signed)
Subjective:  Patient ID: Jon Nelson, male    DOB: [birth year]  Age: 74 y.o. MRN: [id number]  Chief Complaint  Patient presents with  . Memory Loss    Patient states his memeory has improved some.    HPI Patient presents for memory loss follow-up.  He does feel like he is improved on Aricept 5 mg once daily at night.  Patient has diabetes with nephropathy and I added lisinopril to his regimen at his last visit.  He is day today for a repeat chemistry panel.  Jon presents with a diagnosis of occlusion and stenosis of bilateral carotid arteries.  This was diagnosed 02/01/2019.  The course has been stable and nonprogressive. He is currently on aspirin and lipitor.  He denies chest pain, cough, headache and palpitations.  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       Past Medical History:  Diagnosis Date  . Carotid artery stenosis   . Diabetes ([place])   . GI bleed   . HLD (hyperlipidemia)   . HTN (hypertension)   . Other amnesia   . TIA (transient ischemic attack)    Past Surgical History:  Procedure Laterality Date  . HEMORRHOID SURGERY    . LOOP RECORDER INSERTION N/A 02/03/2019   Procedure: LOOP RECORDER INSERTION;  Surgeon: Constance Haw, MD;  Location: [place] [facility];  Service: Cardiovascular;  Laterality: N/A;  . TEE WITHOUT CARDIOVERSION N/A 02/03/2019   Procedure: TRANSESOPHAGEAL ECHOCARDIOGRAM (TEE);  Surgeon: Jerline Pain, MD;  Location: [facility] ENDOSCOPY;  Service: Cardiovascular;  Laterality: N/A;  loop    Family History  Problem Relation Age of Onset  . Stroke Mother    Social History   Socioeconomic History  . Marital status: Widowed    Spouse name: Not on file  . Number of children: Not on file  . Years of education: Not on file  . Highest education level: Not on file  Occupational History  . Occupation: retired  Tobacco Use  . Smoking status: Never Smoker  . Smokeless tobacco: Never Used  Vaping Use  . Vaping Use:  Never used  Substance and Sexual Activity  . Alcohol use: Never  . Drug use: Never  . Sexual activity: Not on file  Other Topics Concern  . Not on file  Social History Narrative  . Not on file   Social Determinants of Health   Financial Resource Strain:   . Difficulty of Paying Living Expenses:   Food Insecurity:   . Worried About Charity fundraiser in the Last Year:   . Arboriculturist in the Last Year:   Transportation Needs:   . Film/video editor (Medical):   Marland Kitchen Lack of Transportation (Non-Medical):   Physical Activity:   . Days of Exercise per Week:   . Minutes of Exercise per Session:   Stress:   . Feeling of Stress :   Social Connections:   . Frequency of Communication with Friends and Family:   . Frequency of Social Gatherings with Friends and Family:   . Attends Religious Services:   . Active Member of Clubs or Organizations:   . Attends Archivist Meetings:   Marland Kitchen Marital Status:     Review of Systems  Constitutional: Negative for chills, diaphoresis, fatigue and fever.  HENT: Negative for congestion, ear pain and sore throat.   Respiratory: Negative for cough and shortness of breath.   Cardiovascular: Negative for chest pain  and leg swelling.  Musculoskeletal: Negative for arthralgias and myalgias.       Dysfunction of motor function of right hand since stroke.  Neurological: Negative for dizziness and headaches.  Psychiatric/Behavioral: Negative for confusion (memory issues since his stroke. His MRI showed the stroke, but also some moderate generalized shrinking.) and dysphoric mood. The patient is not nervous/anxious.      Objective:  BP 124/82   Pulse 73   Temp 97.6 F (36.4 C)   Ht 5\' 9"  (1.753 m)   Wt 189 lb (85.7 kg)   SpO2 98%   BMI 27.91 kg/m   BP/Weight 04/26/2020 03/30/2020 [id number]  Systolic BP [id number] [id number] [id number]  Diastolic BP 82 80 74  Wt. (Lbs) 189 184 180  BMI 27.91 27.98 26.58    Physical Exam Vitals reviewed.    Constitutional:      Appearance: Normal appearance.  Eyes:     Conjunctiva/sclera: Conjunctivae normal.     Comments: Retina: only partially visualized due to cataracts.  Cardiovascular:     Rate and Rhythm: Normal rate and regular rhythm.     Pulses: Normal pulses.     Heart sounds: Normal heart sounds.  Pulmonary:     Effort: Pulmonary effort is normal. No respiratory distress.     Breath sounds: Normal breath sounds.  Abdominal:     General: Abdomen is flat. Bowel sounds are normal.     Palpations: Abdomen is soft.  Musculoskeletal:        General: Normal range of motion.     Cervical back: Normal range of motion.  Skin:    General: Skin is warm.  Neurological:     Mental Status: He is alert and oriented to person, place, and time.  Psychiatric:        Mood and Affect: Mood normal.        Behavior: Behavior normal.    Diabetic Foot Exam - Simple   No data filed        Assessment & Plan:  1. Memory loss Increased aricept to 10 mg once daily at night.   2. Diabetic nephropathy Recommend continue lisinopril Check CMP.  Follow-up: Return in about 2 months (around 06/26/2020) for fasting visit. Marland Kitchen  An After Visit Summary was printed and given to the patient.  Rochel Brome [facility] [phone]

## 2020-04-26 ENCOUNTER — Encounter: Payer: Self-pay | Admitting: Family Medicine

## 2020-04-26 ENCOUNTER — Other Ambulatory Visit: Payer: Self-pay

## 2020-04-26 ENCOUNTER — Ambulatory Visit (INDEPENDENT_AMBULATORY_CARE_PROVIDER_SITE_OTHER): Payer: Medicare HMO | Admitting: Family Medicine

## 2020-04-26 VITALS — BP 124/82 | HR 73 | Temp 97.6°F | Ht 69.0 in | Wt 189.0 lb

## 2020-04-26 DIAGNOSIS — E1121 Type 2 diabetes mellitus with diabetic nephropathy: Secondary | ICD-10-CM | POA: Diagnosis not present

## 2020-04-26 DIAGNOSIS — R413 Other amnesia: Secondary | ICD-10-CM | POA: Diagnosis not present

## 2020-04-26 MED ORDER — DONEPEZIL HCL 10 MG PO TABS
10.0000 mg | ORAL_TABLET | Freq: Every day | ORAL | 0 refills | Status: DC
Start: 2020-04-26 — End: 2020-06-28

## 2020-04-26 NOTE — Patient Instructions (Signed)
1. Memory loss related to stroke. Increase donepazil 10 mg once daily at night.

## 2020-04-27 LAB — COMPREHENSIVE METABOLIC PANEL
ALT: 20 IU/L (ref 0–44)
AST: 21 IU/L (ref 0–40)
Albumin/Globulin Ratio: 1.5 (ref 1.2–2.2)
Albumin: 4 g/dL (ref 3.7–4.7)
Alkaline Phosphatase: 106 IU/L (ref 48–121)
BUN/Creatinine Ratio: 14 (ref 10–24)
BUN: 11 mg/dL (ref 8–27)
Bilirubin Total: 0.5 mg/dL (ref 0.0–1.2)
CO2: 23 mmol/L (ref 20–29)
Calcium: 9.3 mg/dL (ref 8.6–10.2)
Chloride: 101 mmol/L (ref 96–106)
Creatinine, Ser: 0.78 mg/dL (ref 0.76–1.27)
GFR calc Af Amer: 103 mL/min/{1.73_m2} (ref >59)
GFR calc non Af Amer: 89 mL/min/{1.73_m2} (ref >59)
Globulin, Total: 2.7 g/dL (ref 1.5–4.5)
Glucose: 130 mg/dL — ABNORMAL HIGH (ref 65–99)
Potassium: 5.5 mmol/L — ABNORMAL HIGH (ref 3.5–5.2)
Sodium: 136 mmol/L (ref 134–144)
Total Protein: 6.7 g/dL (ref 6.0–8.5)

## 2020-05-14 ENCOUNTER — Encounter: Payer: Self-pay | Admitting: Family Medicine

## 2020-05-18 ENCOUNTER — Ambulatory Visit (INDEPENDENT_AMBULATORY_CARE_PROVIDER_SITE_OTHER): Payer: Medicare HMO

## 2020-05-18 ENCOUNTER — Other Ambulatory Visit: Payer: Self-pay

## 2020-05-18 VITALS — BP 122/70 | HR 69 | Temp 97.7°F | Resp 16 | Ht 69.0 in | Wt 184.6 lb

## 2020-05-18 DIAGNOSIS — Z0000 Encounter for general adult medical examination without abnormal findings: Secondary | ICD-10-CM | POA: Diagnosis not present

## 2020-05-18 NOTE — Progress Notes (Signed)
Subjective:   Jon Nelson is a 74 y.o. male who presents for Medicare Annual/Subsequent preventive examination.  This wellness visit is conducted by a nurse.  The patient's medications were reviewed and reconciled since the patient's last visit.  History details were provided by the patient.  The history appears to be reliable.    Patient's last AWV was over a year ago.   Medical History: Patient history and Family history was reviewed  Medications, Allergies, and preventative health maintenance was reviewed and updated.   Review of Systems:  Review of Systems  Constitutional: Negative.   HENT: Positive for hearing loss.        Left Ear  Eyes: Positive for visual disturbance.       Cataracts   Respiratory: Negative.  Negative for cough, chest tightness and shortness of breath.   Cardiovascular: Negative for chest pain and palpitations.  Gastrointestinal: Negative.   Genitourinary: Negative.   Neurological: Negative for dizziness, numbness and headaches.       Problems with memory  Psychiatric/Behavioral: Negative for agitation and suicidal ideas. The patient is not nervous/anxious.    Cardiac Risk Factors include: none;advanced age (>56mn, >>52women);diabetes mellitus;male gender     Objective:    Vitals: BP 122/70 (BP Location: Left Arm, Patient Position: Sitting, Cuff Size: Large)   Pulse 69   Temp 97.7 F (36.5 C) (Temporal)   Resp 16   Ht _0  (1.753 m)   Wt 184 lb 9.6 oz (83.7 kg)   SpO2 97%   BMI 27.26 kg/m   Body mass index is 27.26 kg/m.  Advanced Directives 05/18/2020 01/30/2019  Does Patient Have a Medical Advance Directive? Yes Yes  Type of [place] Does patient want to make changes to medical advance directive? No - Patient declined No - Patient declined  Copy of [place] Chart? No - copy requested No - copy requested    Tobacco Social History   Tobacco Use    Smoking Status Never Smoker  Smokeless Tobacco Never Used     Counseling given: Not Answered   Clinical Intake:  Pre-visit preparation completed: Yes  Pain : No/denies pain Pain Score: 0-No pain     Diabetes: Yes CBG done?: No Did pt. bring in CBG monitor from home?: No  How often do you need to have someone help you when you read instructions, pamphlets, or other written materials from your doctor or pharmacy?: 3 - Sometimes        Past Medical History:  Diagnosis Date  . Carotid artery stenosis   . Diabetes ([place]   . GI bleed   . HLD (hyperlipidemia)   . HTN (hypertension)   . Other amnesia   . TIA (transient ischemic attack)    Past Surgical History:  Procedure Laterality Date  . HEMORRHOID SURGERY    . LOOP RECORDER INSERTION N/A 02/03/2019   Procedure: LOOP RECORDER INSERTION;  Surgeon: CConstance Haw MD;  Location: [facility];  Service: Cardiovascular;  Laterality: N/A;  . TEE WITHOUT CARDIOVERSION N/A 02/03/2019   Procedure: TRANSESOPHAGEAL ECHOCARDIOGRAM (TEE);  Surgeon: SJerline Pain MD;  [facility];  Service: Cardiovascular;  Laterality: N/A;  loop   Family History  Problem Relation Age of Onset  . Stroke Mother    Social History   Socioeconomic History  . Marital status: Widowed    Spouse name: Not on file  . Number of  children: Not on file  . Years of education: Not on file  . Highest education level: Not on file  Occupational History  . Occupation: retired  Tobacco Use  . Smoking status: Never Smoker  . Smokeless tobacco: Never Used  Vaping Use  . Vaping Use: Never used  Substance and Sexual Activity  . Alcohol use: Never  . Drug use: Never  . Sexual activity: Not on file  Other Topics Concern  . Not on file  Social History Narrative  . Not on file   Social Determinants of Health   Financial Resource Strain:   . Difficulty of Paying Living Expenses:   Food Insecurity:   . Worried About Sales executive in the Last Year:   . Arboriculturist in the Last Year:   Transportation Needs:   . Film/video editor (Medical):   Marland Kitchen Lack of Transportation (Non-Medical):   Physical Activity:   . Days of Exercise per Week:   . Minutes of Exercise per Session:   Stress:   . Feeling of Stress :   Social Connections:   . Frequency of Communication with Friends and Family:   . Frequency of Social Gatherings with Friends and Family:   . Attends Religious Services:   . Active Member of Clubs or Organizations:   . Attends Archivist Meetings:   Marland Kitchen Marital Status:     Outpatient Encounter Medications as of 05/18/2020  Medication Sig  . aspirin 325 MG tablet Take 325 mg by mouth daily.  Marland Kitchen atorvastatin (LIPITOR) 80 MG tablet   . Cyanocobalamin 1000 MCG/ML KIT Inject 1,000 mcg as directed every 30 (thirty) days.  Marland Kitchen lisinopril (ZESTRIL) 5 MG tablet Take 1 tablet (5 mg total) by mouth daily.  . metFORMIN (GLUCOPHAGE) 500 MG tablet Take 500 mg by mouth every morning.   . donepezil (ARICEPT) 10 MG tablet Take 1 tablet (10 mg total) by mouth at bedtime. (Patient not taking: Reported on 05/18/2020)   No facility-administered encounter medications on file as of 05/18/2020.    Activities of Daily Living In your present state of health, do you have any difficulty performing the following activities: 05/18/2020 04/26/2020  Hearing? Tempie Donning  Vision? N N  Difficulty concentrating or making decisions? Tempie Donning  Walking or climbing stairs? N N  Dressing or bathing? N N  Doing errands, shopping? N N  Preparing Food and eating ? N -  Using the Toilet? N -  In the past six months, have you accidently leaked urine? N -  Do you have problems with loss of bowel control? N -  Managing your Medications? N -  Managing your Finances? N -  Housekeeping or managing your Housekeeping? N -  Some recent data might be hidden    Patient Care Team: Rochel Brome, MD as PCP - General (Family Medicine)   Assessment:    This is a routine wellness examination for Jon Nelson.  Exercise Activities and Dietary recommendations Current Exercise Habits: Home exercise routine, Type of exercise: walking;stretching, Time (Minutes): 15, Frequency (Times/Week): 3, Weekly Exercise (Minutes/Week): 45, Intensity: Mild, Exercise limited by: None identified    Fall Risk Fall Risk  05/18/2020 05/18/2020 03/30/2020  Falls in the past year? 0 0 0  Number falls in past yr: 0 0 0  Injury with Fall? 0 0 -  Risk for fall due to : - No Fall Risks -  Follow up - Falls evaluation completed;Education provided;Falls prevention discussed -  Is the patient's home free of loose throw rugs in walkways, pet beds, electrical cords, etc?   yes      Grab bars in the bathroom? no      Handrails on the stairs?   yes      Adequate lighting?   yes  Depression Screen PHQ 2/9 Scores 05/18/2020 03/30/2020 03/30/2020 03/18/2019  PHQ - 2 Score 0 0 0 0    Cognitive Function MMSE - Mini Mental State Exam 05/18/2020  Orientation to time 5  Orientation to Place 5  Registration 3  Attention/ Calculation 2  Recall 0  Language- name 2 objects 2  Language- repeat 1  Language- follow 3 step command 3  Language- read & follow direction 1  Write a sentence 1  Copy design 1  Total score 24   Previous score 11/30/2019: 22      Immunization History  Administered Date(s) Administered  . Moderna SARS-COVID-2 Vaccination 01/14/2020, 02/09/2020  . Td 12/16/1989     Screening Tests Health Maintenance  Topic Date Due  . OPHTHALMOLOGY EXAM  Never done  . TETANUS/TDAP  12/17/1999  . PNA vac Low Risk Adult (1 of 2 - PCV13) Never done  . HEMOGLOBIN A1C  08/03/2019  . INFLUENZA VACCINE  07/02/2020  . FOOT EXAM  03/30/2021  . COLONOSCOPY  12/14/2023  . COVID-19 Vaccine  Completed  . Hepatitis C Screening  Completed   Cancer Screenings: Lung: Low Dose CT Chest recommended if Age 44-80 years, 30 pack-year currently smoking OR have quit w/in 15years.  Patient does not qualify. Colorectal:         Plan:    Counseling was provided today regarding the following topics: healthy eating habits, home safety, vitamin and mineral supplementation (Multivitiman), regular exercise, tobacco avoidance, limitation of alcohol intake, use of seat belts, firearm safety, and fall prevention.  Annual recommendations include: influenza vaccine, dental cleanings, and eye exams.  I was unable to locate proof of PNA Vac, patient states that he has had them both  I have personally reviewed and noted the following in the patient's chart:   . Medical and social history . Use of alcohol, tobacco or illicit drugs  . Current medications and supplements . Functional ability and status . Nutritional status . Physical activity . Advanced directives . List of other physicians . Hospitalizations, surgeries, and ER visits in previous 12 months . Vitals . Screenings to include cognitive, depression, and falls . Referrals and appointments  In addition, I have reviewed and discussed with patient certain preventive protocols, quality metrics, and best practice recommendations. A written personalized care plan for preventive services as well as general preventive health recommendations were provided to patient.     Erie Noe, LPN  4/48/1856

## 2020-05-18 NOTE — Patient Instructions (Signed)
 Fall Prevention in the Home, Adult Falls can cause injuries. They can happen to people of all ages. There are many things you can do to make your home safe and to help prevent falls. Ask for help when making these changes, if needed. What actions can I take to prevent falls? General Instructions  Use good lighting in all rooms. Replace any light bulbs that burn out.  Turn on the lights when you go into a dark area. Use night-lights.  Keep items that you use often in easy-to-reach places. Lower the shelves around your home if necessary.  Set up your furniture so you have a clear path. Avoid moving your furniture around.  Do not have throw rugs and other things on the floor that can make you trip.  Avoid walking on wet floors.  If any of your floors are uneven, fix them.  Add color or contrast paint or tape to clearly mark and help you see: ? Any grab bars or handrails. ? First and last steps of stairways. ? Where the edge of each step is.  If you use a stepladder: ? Make sure that it is fully opened. Do not climb a closed stepladder. ? Make sure that both sides of the stepladder are locked into place. ? Ask someone to hold the stepladder for you while you use it.  If there are any pets around you, be aware of where they are. What can I do in the bathroom?      Keep the floor dry. Clean up any water that spills onto the floor as soon as it happens.  Remove soap buildup in the tub or shower regularly.  Use non-skid mats or decals on the floor of the tub or shower.  Attach bath mats securely with double-sided, non-slip rug tape.  If you need to sit down in the shower, use a plastic, non-slip stool.  Install grab bars by the toilet and in the tub and shower. Do not use towel bars as grab bars. What can I do in the bedroom?  Make sure that you have a light by your bed that is easy to reach.  Do not use any sheets or blankets that are too big for your bed. They should  not hang down onto the floor.  Have a firm chair that has side arms. You can use this for support while you get dressed. What can I do in the kitchen?  Clean up any spills right away.  If you need to reach something above you, use a strong step stool that has a grab bar.  Keep electrical cords out of the way.  Do not use floor polish or wax that makes floors slippery. If you must use wax, use non-skid floor wax. What can I do with my stairs?  Do not leave any items on the stairs.  Make sure that you have a light switch at the top of the stairs and the bottom of the stairs. If you do not have them, ask someone to add them for you.  Make sure that there are handrails on both sides of the stairs, and use them. Fix handrails that are broken or loose. Make sure that handrails are as long as the stairways.  Install non-slip stair treads on all stairs in your home.  Avoid having throw rugs at the top or bottom of the stairs. If you do have throw rugs, attach them to the floor with carpet tape.  Choose a carpet that   does not hide the edge of the steps on the stairway.  Check any carpeting to make sure that it is firmly attached to the stairs. Fix any carpet that is loose or worn. What can I do on the outside of my home?  Use bright outdoor lighting.  Regularly fix the edges of walkways and driveways and fix any cracks.  Remove anything that might make you trip as you walk through a door, such as a raised step or threshold.  Trim any bushes or trees on the path to your home.  Regularly check to see if handrails are loose or broken. Make sure that both sides of any steps have handrails.  Install guardrails along the edges of any raised decks and porches.  Clear walking paths of anything that might make someone trip, such as tools or rocks.  Have any leaves, snow, or ice cleared regularly.  Use sand or salt on walking paths during winter.  Clean up any spills in your garage right  away. This includes grease or oil spills. What other actions can I take?  Wear shoes that: ? Have a low heel. Do not wear high heels. ? Have rubber bottoms. ? Are comfortable and fit you well. ? Are closed at the toe. Do not wear open-toe sandals.  Use tools that help you move around (mobility aids) if they are needed. These include: ? Canes. ? Walkers. ? Scooters. ? Crutches.  Review your medicines with your doctor. Some medicines can make you feel dizzy. This can increase your chance of falling. Ask your doctor what other things you can do to help prevent falls. Where to find more information  Centers for Disease Control and Prevention, STEAD[employer]: https://cdc.gov  National Institute on Aging: https://go4life.nia.nih.gov Contact a doctor if:  You are afraid of falling at home.  You feel weak, drowsy, or dizzy at home.  You fall at home. Summary  There are many simple things that you can do to make your home safe and to help prevent falls.  Ways to make your home safe include removing tripping hazards and installing grab bars in the bathroom.  Ask for help when making these changes in your home. This information is not intended to replace advice given to you by your health care provider. Make sure you discuss any questions you have with your health care provider. Document Revised: 03/11/2019 Document Reviewed: 07/03/2017 Elsevier Patient Education  2020 Elsevier Inc.   Health Maintenance, Male Adopting a healthy lifestyle and getting preventive care are important in promoting health and wellness. Ask your health care provider about:  The right schedule for you to have regular tests and exams.  Things you can do on your own to prevent diseases and keep yourself healthy. What should I know about diet, weight, and exercise? Eat a healthy diet   Eat a diet that includes plenty of vegetables, fruits, low-fat dairy products, and lean protein.  Do not eat a lot of foods  that are high in solid fats, added sugars, or sodium. Maintain a healthy weight Body mass index (BMI) is a measurement that can be used to identify possible weight problems. It estimates body fat based on height and weight. Your health care provider can help determine your BMI and help you achieve or maintain a healthy weight. Get regular exercise Get regular exercise. This is one of the most important things you can do for your health. Most adults should:  Exercise for at least 150 minutes each week. The   exercise should increase your heart rate and make you sweat (moderate-intensity exercise).  Do strengthening exercises at least twice a week. This is in addition to the moderate-intensity exercise.  Spend less time sitting. Even light physical activity can be beneficial. Watch cholesterol and blood lipids Have your blood tested for lipids and cholesterol at 74 years of age, then have this test every 5 years. You may need to have your cholesterol levels checked more often if:  Your lipid or cholesterol levels are high.  You are older than 74 years of age.  You are at high risk for heart disease. What should I know about cancer screening? Many types of cancers can be detected early and may often be prevented. Depending on your health history and family history, you may need to have cancer screening at various ages. This may include screening for:  Colorectal cancer.  Prostate cancer.  Skin cancer.  Lung cancer. What should I know about heart disease, diabetes, and high blood pressure? Blood pressure and heart disease  High blood pressure causes heart disease and increases the risk of stroke. This is more likely to develop in people who have high blood pressure readings, are of African descent, or are overweight.  Talk with your health care provider about your target blood pressure readings.  Have your blood pressure checked: ? Every 3-5 years if you are 18-39 years of  age. ? Every year if you are 40 years old or older.  If you are between the ages of 65 and 75 and are a current or former smoker, ask your health care provider if you should have a one-time screening for abdominal aortic aneurysm (AAA). Diabetes Have regular diabetes screenings. This checks your fasting blood sugar level. Have the screening done:  Once every three years after age 45 if you are at a normal weight and have a low risk for diabetes.  More often and at a younger age if you are overweight or have a high risk for diabetes. What should I know about preventing infection? Hepatitis B If you have a higher risk for hepatitis B, you should be screened for this virus. Talk with your health care provider to find out if you are at risk for hepatitis B infection. Hepatitis C Blood testing is recommended for:  Everyone born from 1945 through 1965.  Anyone with known risk factors for hepatitis C. Sexually transmitted infections (STIs)  You should be screened each year for STIs, including gonorrhea and chlamydia, if: ? You are sexually active and are younger than 74 years of age. ? You are older than 74 years of age and your health care provider tells you that you are at risk for this type of infection. ? Your sexual activity has changed since you were last screened, and you are at increased risk for chlamydia or gonorrhea. Ask your health care provider if you are at risk.  Ask your health care provider about whether you are at high risk for HIV. Your health care provider may recommend a prescription medicine to help prevent HIV infection. If you choose to take medicine to prevent HIV, you should first get tested for HIV. You should then be tested every 3 months for as long as you are taking the medicine. Follow these instructions at home: Lifestyle  Do not use any products that contain nicotine or tobacco, such as cigarettes, e-cigarettes, and chewing tobacco. If you need help quitting,  ask your health care provider.  Do not use street   drugs.  Do not share needles.  Ask your health care provider for help if you need support or information about quitting drugs. Alcohol use  Do not drink alcohol if your health care provider tells you not to drink.  If you drink alcohol: ? Limit how much you have to 0-2 drinks a day. ? Be aware of how much alcohol is in your drink. In the [place] one drink equals one 12 oz bottle of beer (355 mL), one 5 oz glass of wine (148 mL), or one 1 oz glass of hard liquor (44 mL). General instructions  Schedule regular health, dental, and eye exams.  Stay current with your vaccines.  Tell your health care provider if: ? You often feel depressed. ? You have ever been abused or do not feel safe at home. Summary  Adopting a healthy lifestyle and getting preventive care are important in promoting health and wellness.  Follow your health care provider's instructions about healthy diet, exercising, and getting tested or screened for diseases.  Follow your health care provider's instructions on monitoring your cholesterol and blood pressure. This information is not intended to replace advice given to you by your health care provider. Make sure you discuss any questions you have with your health care provider. Document Revised: 11/11/2018 Document Reviewed: 11/11/2018 Elsevier Patient Education  [place].   Hearing Loss Hearing loss is a partial or total loss of the ability to hear. This can be temporary or permanent, and it can happen in one or both ears. Medical care is necessary to treat hearing loss properly and to prevent the condition from getting worse. Your hearing may partially or completely come back, depending on what caused your hearing loss and how severe it is. In some cases, hearing loss is permanent. What are the causes? Common causes of hearing loss include:  Too much wax in the ear canal.  Infection of the ear canal  or middle ear.  Fluid in the middle ear.  Injury to the ear or surrounding area.  An object stuck in the ear.  A history of prolonged exposure to loud sounds, such as music. Less common causes of hearing loss include:  Tumors in the ear.  Viral or bacterial infections, such as meningitis.  A hole in the eardrum (perforated eardrum).  Problems with the hearing nerve that sends signals between the brain and the ear.  Certain medicines. What are the signs or symptoms? Symptoms of this condition may include:  Difficulty telling the difference between sounds.  Difficulty following a conversation when there is background noise.  Lack of response to sounds in your environment. This may be most noticeable when you do not respond to startling sounds.  Needing to turn up the volume on the television, radio, or other devices.  Ringing in the ears.  Dizziness. How is this diagnosed? This condition is diagnosed based on:  A physical exam.  A hearing test (audiometry). The audiometry test will be performed by a hearing specialist (audiologist). You may also be referred to an ear, nose, and throat (ENT) specialist (otolaryngologist). How is this treated? Treatment for hearing loss may include:  Ear wax removal.  Medicines to treat or prevent infection (antibiotics).  Medicines to reduce inflammation (corticosteroids).  Hearing aids for hearing loss related to nerve damage. Follow these instructions at home:  If you were prescribed an antibiotic medicine, take it as told by your health care provider. Do not stop taking the antibiotic even if you start  to feel better.  Take over-the-counter and prescription medicines only as told by your health care provider.  Avoid loud noises.  Return to your normal activities as told by your health care provider. Ask your health care provider what activities are safe for you.  Keep all follow-up visits as told by your health care  provider. This is important. Contact a health care provider if:  You feel dizzy.  You develop new symptoms.  You vomit or feel nauseous.  You have a fever. Get help right away if:  You develop sudden changes in your vision.  You have severe ear pain.  You have new or increased weakness.  You have a severe headache. Summary  Hearing loss is a decreased ability to hear sounds around you. It can be temporary or permanent.  Treatment will depend on the cause of your hearing loss. It may include ear wax removal, medicines, or a hearing aid.  Your hearing may partially or completely come back, depending on what caused your hearing loss and how severe it is.  Keep all follow-up visits as told by your health care provider. This is important. This information is not intended to replace advice given to you by your health care provider. Make sure you discuss any questions you have with your health care provider. Document Revised: 08/18/2018 Document Reviewed: 08/18/2018 Elsevier Patient Education  [place].

## 2020-06-28 ENCOUNTER — Other Ambulatory Visit: Payer: Self-pay

## 2020-06-28 ENCOUNTER — Ambulatory Visit (INDEPENDENT_AMBULATORY_CARE_PROVIDER_SITE_OTHER): Payer: Medicare HMO | Admitting: Family Medicine

## 2020-06-28 ENCOUNTER — Encounter: Payer: Self-pay | Admitting: Family Medicine

## 2020-06-28 VITALS — BP 110/74 | HR 81 | Temp 97.4°F | Ht 69.0 in | Wt 185.0 lb

## 2020-06-28 DIAGNOSIS — R3911 Hesitancy of micturition: Secondary | ICD-10-CM | POA: Diagnosis not present

## 2020-06-28 DIAGNOSIS — R413 Other amnesia: Secondary | ICD-10-CM

## 2020-06-28 DIAGNOSIS — I10 Essential (primary) hypertension: Secondary | ICD-10-CM

## 2020-06-28 DIAGNOSIS — E1121 Type 2 diabetes mellitus with diabetic nephropathy: Secondary | ICD-10-CM | POA: Diagnosis not present

## 2020-06-28 DIAGNOSIS — R3915 Urgency of urination: Secondary | ICD-10-CM | POA: Diagnosis not present

## 2020-06-28 DIAGNOSIS — E782 Mixed hyperlipidemia: Secondary | ICD-10-CM | POA: Diagnosis not present

## 2020-06-28 LAB — POCT URINALYSIS DIPSTICK
Bilirubin, UA: NEGATIVE
Blood, UA: NEGATIVE
Glucose, UA: NEGATIVE
Ketones, UA: NEGATIVE
Leukocytes, UA: NEGATIVE
Nitrite, UA: NEGATIVE
Protein, UA: POSITIVE — AB
Spec Grav, UA: 1.025 (ref 1.010–1.025)
Urobilinogen, UA: 0.2 E.U./dL
pH, UA: 6 (ref 5.0–8.0)

## 2020-06-28 LAB — POCT UA - MICROALBUMIN: Microalbumin Ur, POC: 150 mg/L

## 2020-06-28 NOTE — Progress Notes (Signed)
Subjective:  Patient ID: Jon Nelson, male    DOB: [birth year]  Age: 74 y.o. MRN: [id number]  Chief Complaint  Patient presents with  . Diabetes  . Hyperlipidemia  . Depression    HPI Patient to be evaluated for type 2 diabetes mellitus with other specified complication.  Specifically, this is type 2, non-insulin requiring diabetes, complicated by hyperlipidemia and carotid artery stenosis.  Compliance with treatment has been good; he takes his medication as directed, maintains his exercise regimen, and follows up as directed.  He has polyuria. Current meds include an oral hypoglycemic ( Glucophage (500mg  qd ) ) and lipid lowering agent (Lipitor ).  He does not perform home blood glucose monitoring.  Most recent lab results include glycohemoglobin 6.9%.  Has eye exam scheduled 07/27/2020. Concurrent health problems include hypercholesterolemia.      Pt presents with hyperlipidemia.  Date of diagnosis 10/2017.  Current treatment includes Lipitor.  Compliance with treatment has been fair; he skips some medication doses due to forgetfulness.  He denies experiencing any hypercholesterolemia related symptoms.   Concurrent health problems include diabetes.      Jon Nelson presents with a diagnosis of occlusion and stenosis of bilateral carotid arteries.  This was diagnosed 02/01/2019.  The course has been stable and nonprogressive. He is currently on aspirin and lipitor.  He denies chest pain, cough, headache and palpitations.  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      Patient c/o of SOB with exertion.  Memory loss - only took aricept x 2 days due to nausea and vomiting. He took 10 mg instead of 5 mg.   Past Medical History:  Diagnosis Date  . Carotid artery stenosis   . Diabetes ([place])   . GI bleed   . HLD (hyperlipidemia)   . HTN (hypertension)   . Other amnesia   . TIA (transient ischemic attack)    Past Surgical History:  Procedure Laterality Date  .  HEMORRHOID SURGERY    . LOOP RECORDER INSERTION N/A 02/03/2019   Procedure: LOOP RECORDER INSERTION;  Surgeon: Constance Haw, MD;  Location: [facility];  Service: Cardiovascular;  Laterality: N/A;  . TEE WITHOUT CARDIOVERSION N/A 02/03/2019   Procedure: TRANSESOPHAGEAL ECHOCARDIOGRAM (TEE);  Surgeon: Jerline Pain, MD;  Location: [facility] END[facility];  Service: Cardiovascular;  Laterality: N/A;  loop    Family History  Problem Relation Age of Onset  . Stroke Mother    Social History   Socioeconomic History  . Marital status: Widowed    Spouse name: Not on file  . Number of children: Not on file  . Years of education: Not on file  . Highest education level: Not on file  Occupational History  . Occupation: retired  Tobacco Use  . Smoking status: Never Smoker  . Smokeless tobacco: Never Used  Vaping Use  . Vaping Use: Never used  Substance and Sexual Activity  . Alcohol use: Never  . Drug use: Never  . Sexual activity: Not on file  Other Topics Concern  . Not on file  Social History Narrative  . Not on file   Social Determinants of Health   Financial Resource Strain:   . Difficulty of Paying Living Expenses:   Food Insecurity:   . Worried About Charity fundraiser in the Last Year:   . Arboriculturist in the Last Year:   Transportation Needs:   . Film/video editor (Medical):   Marland Kitchen  Lack of Transportation (Non-Medical):   Physical Activity:   . Days of Exercise per Week:   . Minutes of Exercise per Session:   Stress:   . Feeling of Stress :   Social Connections:   . Frequency of Communication with Friends and Family:   . Frequency of Social Gatherings with Friends and Family:   . Attends Religious Services:   . Active Member of Clubs or Organizations:   . Attends Archivist Meetings:   Marland Kitchen Marital Status:     Review of Systems  Constitutional: Positive for fatigue. Negative for chills and fever.  HENT: Negative for congestion, ear pain and sore  throat.   Respiratory: Positive for cough and shortness of breath (x 1 month).   Cardiovascular: Negative for chest pain and leg swelling.  Gastrointestinal: Negative for abdominal pain, constipation, diarrhea, nausea and vomiting.  Endocrine: Negative for polydipsia and polyphagia.  Genitourinary: Positive for frequency and urgency. Negative for dysuria.       Poor bladder control. Increased urine output  Musculoskeletal: Negative for arthralgias, back pain and myalgias.       Dysfunction of motor function of right hand since stroke.  Neurological: Positive for weakness. Negative for headaches.  Psychiatric/Behavioral: Negative for dysphoric mood.     Objective:  BP 110/74   Pulse 81   Temp (!) 97.4 F (36.3 C)   Ht 5\' 9"  (1.753 m)   Wt 185 lb (83.9 kg)   SpO2 99%   BMI 27.32 kg/m   BP/Weight 06/28/2020 05/18/2020 1/75/1025  Systolic BP 852 778 242  Diastolic BP 74 70 82  Wt. (Lbs) 185 184.6 189  BMI 27.32 27.26 27.91    Physical Exam Constitutional:      Appearance: Normal appearance. He is normal weight.  Neck:     Vascular: No carotid bruit.  Cardiovascular:     Rate and Rhythm: Normal rate and regular rhythm.     Pulses: Normal pulses.     Heart sounds: Normal heart sounds.  Pulmonary:     Effort: Pulmonary effort is normal. No respiratory distress.     Breath sounds: Normal breath sounds.  Abdominal:     General: Abdomen is flat. Bowel sounds are normal.     Palpations: Abdomen is soft.  Neurological:     Mental Status: He is alert and oriented to person, place, and time.  Psychiatric:        Mood and Affect: Mood normal.        Behavior: Behavior normal.    Diabetic Foot Exam - Simple   Simple Foot Form Diabetic Foot exam was performed with the following findings: Yes 06/28/2020  9:28 AM  Visual Inspection Sensation Testing Pulse Check Comments Athletes foot in between toes of BL feet Hammer toes 2-5 on BL feet Calluses Thickened toenails Sensation  on rt lateral foot Negative sensation on lt lateral foot       Assessment & Plan:  1. Essential hypertension Well controlled.  No changes to medicines.  Continue to work on eating a healthy diet and exercise.  Labs drawn today.  - Comprehensive metabolic panel - CBC with Differential/Platelet  2. Mixed hyperlipidemia Poorly controlled.  Change lipitor to crestor 40 mg once daily.  Continue to work on eating a healthy diet and exercise.  Labs drawn today.  - Lipid panel  3. Diabetic nephropathy associated with type 2 diabetes mellitus (HCC) Control: good Recommend check feet daily. Recommend annual eye exams. Encouraged to keep appt.  Medicines: start on lisinopril 10 mg once daily. Continue to work on eating a healthy diet and exercise.  Labs drawn today.   - POCT UA - Microalbumin - Hemoglobin A1c  4. Memory loss Try aricept 5 mg once at night (lower dose.)  5. Urinary urgency.  Urinalysis normal.  Check psa. Was elevated slightly. Refer to urology.   Orders Placed This Encounter  Procedures  . CBC with Differential/Platelet  . Comprehensive metabolic panel  . Hemoglobin A1c  . Lipid panel  . Ambulatory referral to Podiatry  . POCT urinalysis dipstick  . POCT UA - Microalbumin     Follow-up: Return in about 3 months (around 09/28/2020) for Chronic Fasting.  An After Visit Summary was printed and given to the patient.  Rochel Brome [facility] [phone]

## 2020-06-29 ENCOUNTER — Other Ambulatory Visit: Payer: Self-pay

## 2020-06-29 DIAGNOSIS — E1121 Type 2 diabetes mellitus with diabetic nephropathy: Secondary | ICD-10-CM

## 2020-06-29 LAB — LIPID PANEL
Chol/HDL Ratio: 8.6 ratio — ABNORMAL HIGH (ref 0.0–5.0)
Cholesterol, Total: 224 mg/dL — ABNORMAL HIGH (ref 100–199)
HDL: 26 mg/dL — ABNORMAL LOW (ref >39)
LDL Chol Calc (NIH): 143 mg/dL — ABNORMAL HIGH (ref 0–99)
Triglycerides: 299 mg/dL — ABNORMAL HIGH (ref 0–149)
VLDL Cholesterol Cal: 55 mg/dL — ABNORMAL HIGH (ref 5–40)

## 2020-06-29 LAB — COMPREHENSIVE METABOLIC PANEL
ALT: 13 IU/L (ref 0–44)
AST: 16 IU/L (ref 0–40)
Albumin/Globulin Ratio: 1.5 (ref 1.2–2.2)
Albumin: 4 g/dL (ref 3.7–4.7)
Alkaline Phosphatase: 93 IU/L (ref 48–121)
BUN/Creatinine Ratio: 17 (ref 10–24)
BUN: 11 mg/dL (ref 8–27)
Bilirubin Total: 0.5 mg/dL (ref 0.0–1.2)
CO2: 20 mmol/L (ref 20–29)
Calcium: 8.6 mg/dL (ref 8.6–10.2)
Chloride: 103 mmol/L (ref 96–106)
Creatinine, Ser: 0.64 mg/dL — ABNORMAL LOW (ref 0.76–1.27)
GFR calc Af Amer: 112 mL/min/{1.73_m2} (ref >59)
GFR calc non Af Amer: 96 mL/min/{1.73_m2} (ref >59)
Globulin, Total: 2.6 g/dL (ref 1.5–4.5)
Glucose: 125 mg/dL — ABNORMAL HIGH (ref 65–99)
Potassium: 4.7 mmol/L (ref 3.5–5.2)
Sodium: 137 mmol/L (ref 134–144)
Total Protein: 6.6 g/dL (ref 6.0–8.5)

## 2020-06-29 LAB — CBC WITH DIFFERENTIAL/PLATELET
Basophils Absolute: 0 10*3/uL (ref 0.0–0.2)
Basos: 0 %
EOS (ABSOLUTE): 0.2 10*3/uL (ref 0.0–0.4)
Eos: 2 %
Hematocrit: 44.1 % (ref 37.5–51.0)
Hemoglobin: 15.4 g/dL (ref 13.0–17.7)
Immature Grans (Abs): 0.1 10*3/uL (ref 0.0–0.1)
Immature Granulocytes: 1 %
Lymphocytes Absolute: 3.4 10*3/uL — ABNORMAL HIGH (ref 0.7–3.1)
Lymphs: 33 %
MCH: 30 pg (ref 26.6–33.0)
MCHC: 34.9 g/dL (ref 31.5–35.7)
MCV: 86 fL (ref 79–97)
Monocytes Absolute: 0.9 10*3/uL (ref 0.1–0.9)
Monocytes: 9 %
Neutrophils Absolute: 5.8 10*3/uL (ref 1.4–7.0)
Neutrophils: 55 %
Platelets: 321 10*3/uL (ref 150–450)
RBC: 5.13 x10E6/uL (ref 4.14–5.80)
RDW: 13.3 % (ref 11.6–15.4)
WBC: 10.3 10*3/uL (ref 3.4–10.8)

## 2020-06-29 LAB — CARDIOVASCULAR RISK ASSESSMENT

## 2020-06-29 LAB — HEMOGLOBIN A1C
Est. average glucose Bld gHb Est-mCnc: 151 mg/dL
Hgb A1c MFr Bld: 6.9 % — ABNORMAL HIGH (ref 4.8–5.6)

## 2020-06-29 MED ORDER — LISINOPRIL 10 MG PO TABS: 5.0000 mg | ORAL_TABLET | Freq: Every day | ORAL | 0 refills | Status: DC

## 2020-06-29 NOTE — Progress Notes (Signed)
Wbc normal, no anemia, glucose 125, kidney tests OK, potassium normal, liver tests normal, A1c 6.9, cholesterol and triglycerides are high- is she taking atorvastatin regularly lp

## 2020-06-30 ENCOUNTER — Other Ambulatory Visit: Payer: Self-pay

## 2020-06-30 LAB — PSA: Prostate Specific Ag, Serum: 4.7 ng/mL — ABNORMAL HIGH (ref 0.0–4.0)

## 2020-06-30 MED ORDER — ROSUVASTATIN CALCIUM 40 MG PO TABS: 40.0000 mg | ORAL_TABLET | Freq: Every day | ORAL | 1 refills | Status: DC

## 2020-07-03 ENCOUNTER — Other Ambulatory Visit: Payer: Self-pay

## 2020-07-03 DIAGNOSIS — R972 Elevated prostate specific antigen [PSA]: Secondary | ICD-10-CM

## 2020-07-21 DIAGNOSIS — H353131 Nonexudative age-related macular degeneration, bilateral, early dry stage: Secondary | ICD-10-CM | POA: Diagnosis not present

## 2020-07-21 DIAGNOSIS — H2512 Age-related nuclear cataract, left eye: Secondary | ICD-10-CM | POA: Diagnosis not present

## 2020-07-21 DIAGNOSIS — Z01818 Encounter for other preprocedural examination: Secondary | ICD-10-CM | POA: Diagnosis not present

## 2020-08-15 DIAGNOSIS — H2512 Age-related nuclear cataract, left eye: Secondary | ICD-10-CM | POA: Diagnosis not present

## 2020-08-15 DIAGNOSIS — H259 Unspecified age-related cataract: Secondary | ICD-10-CM | POA: Diagnosis not present

## 2020-08-15 DIAGNOSIS — E1136 Type 2 diabetes mellitus with diabetic cataract: Secondary | ICD-10-CM | POA: Diagnosis not present

## 2020-08-15 DIAGNOSIS — I69898 Other sequelae of other cerebrovascular disease: Secondary | ICD-10-CM | POA: Diagnosis not present

## 2020-08-15 DIAGNOSIS — I10 Essential (primary) hypertension: Secondary | ICD-10-CM | POA: Diagnosis not present

## 2020-08-15 DIAGNOSIS — E7800 Pure hypercholesterolemia, unspecified: Secondary | ICD-10-CM | POA: Diagnosis not present

## 2020-08-15 HISTORY — PX: CATARACT EXTRACTION: SUR2

## 2020-09-22 DIAGNOSIS — L570 Actinic keratosis: Secondary | ICD-10-CM | POA: Diagnosis not present

## 2020-09-22 DIAGNOSIS — L820 Inflamed seborrheic keratosis: Secondary | ICD-10-CM | POA: Diagnosis not present

## 2020-09-27 ENCOUNTER — Other Ambulatory Visit: Payer: Self-pay | Admitting: Family Medicine

## 2020-09-28 NOTE — Progress Notes (Signed)
No showed

## 2020-09-29 ENCOUNTER — Ambulatory Visit (INDEPENDENT_AMBULATORY_CARE_PROVIDER_SITE_OTHER): Payer: Medicare HMO | Admitting: Family Medicine

## 2020-09-29 DIAGNOSIS — I10 Essential (primary) hypertension: Secondary | ICD-10-CM

## 2020-09-29 DIAGNOSIS — E782 Mixed hyperlipidemia: Secondary | ICD-10-CM

## 2020-09-29 DIAGNOSIS — E1159 Type 2 diabetes mellitus with other circulatory complications: Secondary | ICD-10-CM

## 2020-11-23 DIAGNOSIS — H35372 Puckering of macula, left eye: Secondary | ICD-10-CM | POA: Diagnosis not present

## 2021-02-02 DIAGNOSIS — L570 Actinic keratosis: Secondary | ICD-10-CM | POA: Diagnosis not present

## 2021-05-11 ENCOUNTER — Telehealth: Payer: Self-pay | Admitting: Family Medicine

## 2021-05-11 NOTE — Chronic Care Management (AMB) (Signed)
  Chronic Care Management   Outreach Note  05/11/2021 Name: Jon Nelson MRN: [id number] DOB: 03-12-1946  Referred by: Rochel Brome, MD Reason for referral : No chief complaint on file.   An unsuccessful telephone outreach was attempted today. The patient was referred to the pharmacist for assistance with care management and care coordination.   Follow Up Plan:   Tatjana Dellinger Upstream Scheduler

## 2021-05-21 ENCOUNTER — Ambulatory Visit: Payer: Medicare HMO

## 2021-05-24 DIAGNOSIS — H2511 Age-related nuclear cataract, right eye: Secondary | ICD-10-CM | POA: Diagnosis not present

## 2021-05-24 DIAGNOSIS — H35372 Puckering of macula, left eye: Secondary | ICD-10-CM | POA: Diagnosis not present

## 2021-05-24 LAB — HM DIABETES EYE EXAM

## 2021-05-30 ENCOUNTER — Telehealth: Payer: Self-pay | Admitting: Family Medicine

## 2021-05-30 NOTE — Progress Notes (Signed)
  Chronic Care Management   Outreach Note  05/30/2021 Name: Jon Nelson MRN: [id number] DOB: [birth year]  Referred by: Rochel Brome, MD Reason for referral : No chief complaint on file.   A second unsuccessful telephone outreach was attempted today. The patient was referred to pharmacist for assistance with care management and care coordination.  Follow Up Plan:   Tatjana Dellinger Upstream Scheduler

## 2021-06-08 ENCOUNTER — Telehealth: Payer: Self-pay | Admitting: Family Medicine

## 2021-06-08 NOTE — Progress Notes (Signed)
  Chronic Care Management   Outreach Note  06/08/2021 Name: Jon Nelson MRN: [id number] DOB: Oct 07, 1946  Referred by: Rochel Brome, MD Reason for referral : No chief complaint on file.   Third unsuccessful telephone outreach was attempted today. The patient was referred to the pharmacist for assistance with care management and care coordination.   Follow Up Plan:  Tatjana Dellinger Upstream Scheduler

## 2021-08-01 ENCOUNTER — Telehealth: Payer: Self-pay

## 2021-08-01 NOTE — Telephone Encounter (Signed)
Called pt to schedule an appt. Could not leave VM. VM was not set up.  KP

## 2021-08-07 DIAGNOSIS — D225 Melanocytic nevi of trunk: Secondary | ICD-10-CM | POA: Diagnosis not present

## 2021-08-07 DIAGNOSIS — L814 Other melanin hyperpigmentation: Secondary | ICD-10-CM | POA: Diagnosis not present

## 2021-08-07 DIAGNOSIS — L570 Actinic keratosis: Secondary | ICD-10-CM | POA: Diagnosis not present

## 2021-08-07 DIAGNOSIS — C44622 Squamous cell carcinoma of skin of right upper limb, including shoulder: Secondary | ICD-10-CM | POA: Diagnosis not present

## 2021-08-07 DIAGNOSIS — L821 Other seborrheic keratosis: Secondary | ICD-10-CM | POA: Diagnosis not present

## 2021-08-07 DIAGNOSIS — L578 Other skin changes due to chronic exposure to nonionizing radiation: Secondary | ICD-10-CM | POA: Diagnosis not present

## 2021-08-07 DIAGNOSIS — C44321 Squamous cell carcinoma of skin of nose: Secondary | ICD-10-CM | POA: Diagnosis not present

## 2021-08-15 ENCOUNTER — Ambulatory Visit (INDEPENDENT_AMBULATORY_CARE_PROVIDER_SITE_OTHER): Payer: Medicare HMO

## 2021-08-15 VITALS — BP 138/72 | HR 92 | Resp 16 | Ht 69.0 in | Wt 184.4 lb

## 2021-08-15 DIAGNOSIS — Z0000 Encounter for general adult medical examination without abnormal findings: Secondary | ICD-10-CM | POA: Diagnosis not present

## 2021-08-15 DIAGNOSIS — Z9114 Patient's other noncompliance with medication regimen: Secondary | ICD-10-CM

## 2021-08-15 DIAGNOSIS — Z9111 Patient's noncompliance with dietary regimen: Secondary | ICD-10-CM | POA: Diagnosis not present

## 2021-08-15 DIAGNOSIS — Z23 Encounter for immunization: Secondary | ICD-10-CM | POA: Diagnosis not present

## 2021-08-15 NOTE — Patient Instructions (Signed)
Fall Prevention in the Home, Adult Falls can cause injuries and can happen to people of all ages. There are many things you can do to make your home safe and to help prevent falls. Ask for help when making these changes. What actions can I take to prevent falls? General Instructions Use good lighting in all rooms. Replace any light bulbs that burn out. Turn on the lights in dark areas. Use night-lights. Keep items that you use often in easy-to-reach places. Lower the shelves around your home if needed. Set up your furniture so you have a clear path. Avoid moving your furniture around. Do not have throw rugs or other things on the floor that can make you trip. Avoid walking on wet floors. If any of your floors are uneven, fix them. Add color or contrast paint or tape to clearly mark and help you see: Grab bars or handrails. First and last steps of staircases. Where the edge of each step is. If you use a stepladder: Make sure that it is fully opened. Do not climb a closed stepladder. Make sure the sides of the stepladder are locked in place. Ask someone to hold the stepladder while you use it. Know where your pets are when moving through your home. What can I do in the bathroom?   Keep the floor dry. Clean up any water on the floor right away. Remove soap buildup in the tub or shower. Use nonskid mats or decals on the floor of the tub or shower. Attach bath mats securely with double-sided, nonslip rug tape. If you need to sit down in the shower, use a plastic, nonslip stool. Install grab bars by the toilet and in the tub and shower. Do not use towel bars as grab bars. What can I do in the bedroom? Make sure that you have a light by your bed that is easy to reach. Do not use any sheets or blankets for your bed that hang to the floor. Have a firm chair with side arms that you can use for support when you get dressed. What can I do in the kitchen? Clean up any spills right away. If you  need to reach something above you, use a step stool with a grab bar. Keep electrical cords out of the way. Do not use floor polish or wax that makes floors slippery. What can I do with my stairs? Do not leave any items on the stairs. Make sure that you have a light switch at the top and the bottom of the stairs. Make sure that there are handrails on both sides of the stairs. Fix handrails that are broken or loose. Install nonslip stair treads on all your stairs. Avoid having throw rugs at the top or bottom of the stairs. Choose a carpet that does not hide the edge of the steps on the stairs. Check carpeting to make sure that it is firmly attached to the stairs. Fix carpet that is loose or worn. What can I do on the outside of my home? Use bright outdoor lighting. Fix the edges of walkways and driveways and fix any cracks. Remove anything that might make you trip as you walk through a door, such as a raised step or threshold. Trim any bushes or trees on paths to your home. Check to see if handrails are loose or broken and that both sides of all steps have handrails. Install guardrails along the edges of any raised decks and porches. Clear paths of anything that can  make you trip, such as tools or rocks. Have leaves, snow, or ice cleared regularly. Use sand or salt on paths during winter. Clean up any spills in your garage right away. This includes grease or oil spills. What other actions can I take? Wear shoes that: Have a low heel. Do not wear high heels. Have rubber bottoms. Feel good on your feet and fit well. Are closed at the toe. Do not wear open-toe sandals. Use tools that help you move around if needed. These include: Canes. Walkers. Scooters. Crutches. Review your medicines with your doctor. Some medicines can make you feel dizzy. This can increase your chance of falling. Ask your doctor what else you can do to help prevent falls. Where to find more information Centers for  Disease Control and Prevention, [employer]: http://www.wolf.info/ National Institute on Aging: http://kim-miller.com/ Contact a doctor if: You are afraid of falling at home. You feel weak, drowsy, or dizzy at home. You fall at home. Summary There are many simple things that you can do to make your home safe and to help prevent falls. Ways to make your home safe include removing things that can make you trip and installing grab bars in the bathroom. Ask for help when making these changes in your home. This information is not intended to replace advice given to you by your health care provider. Make sure you discuss any questions you have with your health care provider. Document Revised: 06/21/2020 Document Reviewed: 06/21/2020 Elsevier Patient Education  Jon Nelson, Male Adopting a healthy lifestyle and getting preventive care are important in promoting health and wellness. Ask your health care provider about: The right schedule for you to have regular tests and exams. Things you can do on your own to prevent diseases and keep yourself healthy. What should I know about diet, weight, and exercise? Eat a healthy diet  Eat a diet that includes plenty of vegetables, fruits, low-fat dairy products, and lean protein. Do not eat a lot of foods that are high in solid fats, added sugars, or sodium. Maintain a healthy weight Body mass index (BMI) is a measurement that can be used to identify possible weight problems. It estimates body fat based on height and weight. Your health care provider can help determine your BMI and help you achieve or maintain a healthy weight. Get regular exercise Get regular exercise. This is one of the most important things you can do for your health. Most adults should: Exercise for at least 150 minutes each week. The exercise should increase your heart rate and make you sweat (moderate-intensity exercise). Do strengthening exercises at least twice a week. This  is in addition to the moderate-intensity exercise. Spend less time sitting. Even light physical activity can be beneficial. Watch cholesterol and blood lipids Have your blood tested for lipids and cholesterol at 75 years of age, then have this test every 5 years. You may need to have your cholesterol levels checked more often if: Your lipid or cholesterol levels are high. You are older than 75 years of age. You are at high risk for heart disease. What should I know about cancer screening? Many types of cancers can be detected early and may often be prevented. Depending on your health history and family history, you may need to have cancer screening at various ages. This may include screening for: Colorectal cancer. Prostate cancer. Skin cancer. Lung cancer. What should I know about heart disease, diabetes, and high blood pressure? Blood pressure and  heart disease High blood pressure causes heart disease and increases the risk of stroke. This is more likely to develop in people who have high blood pressure readings, are of African descent, or are overweight. Talk with your health care provider about your target blood pressure readings. Have your blood pressure checked: Every 3-5 years if you are 56-76 years of age. Every year if you are 34 years old or older. If you are between the ages of 68 and 49 and are a current or former smoker, ask your health care provider if you should have a one-time screening for abdominal aortic aneurysm (AAA). Diabetes Have regular diabetes screenings. This checks your fasting blood sugar level. Have the screening done: Once every three years after age 31 if you are at a normal weight and have a low risk for diabetes. More often and at a younger age if you are overweight or have a high risk for diabetes. What should I know about preventing infection? Hepatitis B If you have a higher risk for hepatitis B, you should be screened for this virus. Talk with your  health care provider to find out if you are at risk for hepatitis B infection. Hepatitis C Blood testing is recommended for: Everyone born from [birth year] through 1965. Anyone with known risk factors for hepatitis C. Sexually transmitted infections (STIs) You should be screened each year for STIs, including gonorrhea and chlamydia, if: You are sexually active and are younger than 75 years of age. You are older than 75 years of age and your health care provider tells you that you are at risk for this type of infection. Your sexual activity has changed since you were last screened, and you are at increased risk for chlamydia or gonorrhea. Ask your health care provider if you are at risk. Ask your health care provider about whether you are at high risk for HIV. Your health care provider may recommend a prescription medicine to help prevent HIV infection. If you choose to take medicine to prevent HIV, you should first get tested for HIV. You should then be tested every 3 months for as long as you are taking the medicine. Follow these instructions at home: Lifestyle Do not use any products that contain nicotine or tobacco, such as cigarettes, e-cigarettes, and chewing tobacco. If you need help quitting, ask your health care provider. Do not use street drugs. Do not share needles. Ask your health care provider for help if you need support or information about quitting drugs. Alcohol use Do not drink alcohol if your health care provider tells you not to drink. If you drink alcohol: Limit how much you have to 0-2 drinks a day. Be aware of how much alcohol is in your drink. In the [place] one drink equals one 12 oz bottle of beer (355 mL), one 5 oz glass of wine (148 mL), or one 1 oz glass of hard liquor (44 mL). General instructions Schedule regular health, dental, and eye exams. Stay current with your vaccines. Tell your health care provider if: You often feel depressed. You have ever been abused or do  not feel safe at home. Summary Adopting a healthy lifestyle and getting preventive care are important in promoting health and wellness. Follow your health care provider's instructions about healthy diet, exercising, and getting tested or screened for diseases. Follow your health care provider's instructions on monitoring your cholesterol and blood pressure. This information is not intended to replace advice given to you by your health care provider.  Make sure you discuss any questions you have with your health care provider. Document Revised: 01/26/2021 Document Reviewed: 11/11/2018 Elsevier Patient Education  2022 [employer].

## 2021-08-15 NOTE — Progress Notes (Signed)
Subjective:   Jon Nelson is a 75 y.o. male who presents for Medicare Annual/Subsequent preventive examination.  This wellness visit is conducted by a nurse.  The patient's medications were reviewed and reconciled since the patient's last visit.  History details were provided by the patient.  The history appears to be reliable however patient had trouble recalling events.  Patient's last AWV was one year ago.   Medical History: Patient history and Family history was reviewed  Medications, Allergies, and preventative health maintenance was reviewed and updated.   Review of Systems    Review of Systems  Constitutional: Negative.   HENT: Negative.    Eyes: Negative.   Respiratory:  Negative for cough, chest tightness and shortness of breath.   Cardiovascular: Negative.  Negative for chest pain and palpitations.  Gastrointestinal: Negative.   Genitourinary: Negative.   Musculoskeletal: Negative.   Neurological:  Positive for weakness. Negative for dizziness, numbness and headaches.       Weakness in right hand (grip)  Psychiatric/Behavioral: Negative.  Negative for behavioral problems, confusion and suicidal ideas.     Cardiac Risk Factors include: advanced age (>62mn, >>68women);diabetes mellitus;dyslipidemia;hypertension;male gender     Objective:    Today's Vitals   08/15/21 0948  BP: 138/72  Pulse: 92  Resp: 16  SpO2: 98%  Weight: 184 lb 6.4 oz (83.6 kg)  Height: _0  (1.753 m)  PainSc: 0-No pain   Body mass index is 27.23 kg/m.  Advanced Directives 08/15/2021 05/18/2020 01/30/2019  Does Patient Have a Medical Advance Directive? No Yes Yes  Type of Advance Directive - Healthcare Power of [place] Does patient want to make changes to medical advance directive? - No - Patient declined No - Patient declined  Copy of [place]? - No - copy requested No - copy requested  Would patient like information on creating a  medical advance directive? No - Patient declined - -    Current Medications (verified) Patient stopped taking ALL medication on his own Outpatient Encounter Medications as of 08/15/2021  Medication Sig   aspirin 325 MG tablet Take 325 mg by mouth daily. (Patient not taking: Reported on 08/15/2021)   Cyanocobalamin 1000 MCG/ML KIT Inject 1,000 mcg as directed every 30 (thirty) days. (Patient not taking: Reported on 08/15/2021)   donepezil (ARICEPT) 10 MG tablet TAKE ONE TABLET BY MOUTH AT BEDTIME (Patient not taking: Reported on 08/15/2021)   donepezil (ARICEPT) 5 MG tablet Take 5 mg by mouth at bedtime. (Patient not taking: Reported on 08/15/2021)   lisinopril (ZESTRIL) 10 MG tablet Take 0.5 tablets (5 mg total) by mouth daily. (Patient not taking: Reported on 08/15/2021)   metFORMIN (GLUCOPHAGE) 500 MG tablet Take 500 mg by mouth every morning.  (Patient not taking: Reported on 08/15/2021)   rosuvastatin (CRESTOR) 40 MG tablet Take 1 tablet (40 mg total) by mouth daily. (Patient not taking: Reported on 08/15/2021)   No facility-administered encounter medications on file as of 08/15/2021.    Allergies (verified) Aricept [donepezil]   History: Past Medical History:  Diagnosis Date   Carotid artery stenosis    Diabetes (HCC)    GI bleed    HLD (hyperlipidemia)    HTN (hypertension)    Other amnesia    TIA (transient ischemic attack)    Past Surgical History:  Procedure Laterality Date   HEMORRHOID SURGERY     LOOP RECORDER INSERTION N/A 02/03/2019   Procedure: LOOP RECORDER INSERTION;  Surgeon: Constance Haw, MD;  Location: [facility];  Service: Cardiovascular;  Laterality: N/A;   TEE WITHOUT CARDIOVERSION N/A 02/03/2019   Procedure: TRANSESOPHAGEAL ECHOCARDIOGRAM (TEE);  Surgeon: Jerline Pain, MD;  [facility];  Service: Cardiovascular;  Laterality: N/A;  loop   Family History  Problem Relation Age of Onset   Stroke Mother    Social History   Socioeconomic  History   Marital status: Widowed   Number of children: 0  Occupational History   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  Patient has a sister, his niece and her family lives in his home temporarily    Social Determinants of Health   Financial Resource Strain: Not on file  Food Insecurity: Not on file  Transportation Needs: Not on file  Physical Activity: Not on file  Stress: Not on file  Social Connections: Not on file    Tobacco Counseling Counseling given: Never tobacco user   Clinical Intake:  Pre-visit preparation completed: Yes Pain : No/denies pain Pain Score: 0-No pain   BMI - recorded: 27.23 Nutritional Status: BMI 25 -29 Overweight Nutritional Risks: None Diabetes: Yes (last A1C 6.9 >1 year ago) CBG done?: No Did pt. bring in CBG monitor from home?: No (Pt does not take his medication or check his CBG) How often do you need to have someone help you when you read instructions, pamphlets, or other written materials from your doctor or pharmacy?: 1 - Never Interpreter Needed?: No    Activities of Daily Living In your present state of health, do you have any difficulty performing the following activities: 08/15/2021  Hearing? N  Vision? N  Difficulty concentrating or making decisions? Y  Walking or climbing stairs? N  Dressing or bathing? N  Doing errands, shopping? N  Preparing Food and eating ? N  Using the Toilet? N  In the past six months, have you accidently leaked urine? N  Do you have problems with loss of bowel control? N  Managing your Medications? N  Managing your Finances? N  Housekeeping or managing  your Housekeeping? N  Some recent data might be hidden    Patient Care Team: Rochel Brome, MD as PCP - General (Family Medicine) Stacie Glaze, Jacqualyn Posey, MD as Consulting Physician (Optometry) Jolene Schimke, MD as Referring Physician (Dermatology)     Assessment:   This is a routine wellness examination for Dalin.   Dietary issues and exercise activities discussed: Current Exercise Habits: Home exercise routine, Type of exercise: walking, Time (Minutes): 20, Frequency (Times/Week): 4, Weekly Exercise (Minutes/Week): 80, Intensity: Mild, Exercise limited by: None identified   Goals Addressed             This Visit's Progress    Follow up with Primary Care Provider       Regular appointments with a provider recommended -- scheduled appointment for later this month     Have 3 meals a day       Eat more vegetables and cut down on junk food     Maintain timely refills of diabetic medication  as prescribed within the year .       Take medication as directed, refilling before running out.       Depression Screen PHQ 2/9 Scores 08/15/2021 05/18/2020 03/30/2020 03/30/2020 03/18/2019  PHQ - 2 Score 0 0 0 0 0    Fall Risk Fall Risk  08/15/2021 05/18/2020 05/18/2020 03/30/2020  Falls in the past year? 1 0 0 0  Number falls in past yr: 1 0 0 0  Injury with Fall? 0 0 0 -  Risk for fall due to : History of fall(s) - No Fall Risks -  Follow up Falls evaluation completed;Education provided;Falls prevention discussed - Falls evaluation completed;Education provided;Falls prevention discussed -    FALL RISK PREVENTION PERTAINING TO THE HOME:  Any stairs in or around the home? Yes  If so, are there any without handrails? No  Home free of loose throw rugs in walkways, pet beds, electrical cords, etc? Yes  Adequate lighting in your home to reduce risk of falls? Yes   ASSISTIVE DEVICES UTILIZED TO PREVENT FALLS:  Life alert? No  Use of a cane, walker or w/c? No  Grab bars in the bathroom? No   Shower chair or bench in shower? No  Elevated toilet seat or a handicapped toilet? No   Gait steady and fast without use of assistive device  Cognitive Function: MMSE - Mini Mental State Exam 05/18/2020  Orientation to time 5  Orientation to Place 5  Registration 3  Attention/ Calculation 2  Recall 0  Language- name 2 objects 2  Language- repeat 1  Language- follow 3 step command 3  Language- read & follow direction 1  Write a sentence 1  Copy design 1  Total score 24     6CIT Screen 08/15/2021  What Year? 0 points  What month? 0 points  What time? 0 points  Count back from 20 2 points  Months in reverse 4 points  Repeat phrase 4 points  Total Score 10    Immunizations Immunization History  Administered Date(s) Administered   Hepatitis B 05/15/1998, 06/12/1998, 10/11/1998   Moderna Sars-Covid-2 Vaccination 01/14/2020, 02/09/2020   Td 12/16/1989    TDAP status: Due, Education has been provided regarding the importance of this vaccine. Advised may receive this vaccine at local pharmacy or Health Dept. Aware to provide a copy of the vaccination record if obtained from local pharmacy or Health Dept. Verbalized acceptance and understanding.  Flu Vaccine status: Completed at today's visit  Pneumococcal vaccine status: Due, Education has been provided regarding the importance of this vaccine. Advised may receive this vaccine at local pharmacy or Health Dept. Aware to provide a copy of the vaccination record if obtained from local pharmacy or Health Dept. Verbalized acceptance and understanding.  Covid-19 vaccine status: Information provided on how to obtain vaccines.   Qualifies for Shingles Vaccine? Yes   Zostavax completed No   Shingrix Completed?: No.    Education has been provided regarding the importance of this vaccine. Patient has been advised to call insurance company to determine out of pocket expense if they have not yet received this vaccine. Advised may also  receive vaccine at local pharmacy or Health Dept. Verbalized acceptance and understanding.  Screening Tests Health Maintenance  Topic Date Due   OPHTHALMOLOGY EXAM  Never done   Zoster Vaccines- Shingrix (1 of 2) Never done   TETANUS/TDAP  12/17/1999   PNA vac Low Risk Adult (1 of 2 - PCV13) Never done   COVID-19 Vaccine (  3 - Moderna risk series) 03/08/2020   HEMOGLOBIN A1C  12/29/2020   FOOT EXAM  06/28/2021   INFLUENZA VACCINE  07/02/2021   COLONOSCOPY (Pts 45-50yr Insurance coverage will need to be confirmed)  12/14/2023   Hepatitis C Screening  Completed   HPV VACCINES  Aged Out    Health Maintenance  Health Maintenance Due  Topic Date Due   OPHTHALMOLOGY EXAM  Never done   Zoster Vaccines- Shingrix (1 of 2) Never done   TETANUS/TDAP  12/17/1999   PNA vac Low Risk Adult (1 of 2 - PCV13) Never done   COVID-19 Vaccine (3 - Moderna risk series) 03/08/2020   HEMOGLOBIN A1C  12/29/2020   FOOT EXAM  06/28/2021   INFLUENZA VACCINE  07/02/2021    Colorectal cancer screening: Type of screening: Colonoscopy. Completed 2015. Repeat every 10 years  Lung Cancer Screening: (Low Dose CT Chest recommended if Age 75-80years, 30 pack-year currently smoking OR have quit w/in 15years.) does not qualify.   Additional Screening:  Vision Screening: Recommended annual ophthalmology exams for early detection of glaucoma and other disorders of the eye. Is the patient up to date with their annual eye exam?  Yes  Who is the provider or what is the name of the office in which the patient attends annual eye exams? [facility]: Recommended annual dental exams for proper oral hygiene     Plan:    1- Received Flu vaccine at today's visit 2- Declined PNA vaccine, Tetanus Vaccine, and Shingrix Vaccine 3- Colonoscopy will be due in 2025 4- Diabetic [facility] Ex[facility]- Diet - recommended  healthy diet, less "junk food" 6- Recommended  daily exercise - walking outside, stretching 7- Recommended follow-up with provider for fasting labs and medications - discussed medication compliance as patient has stopped his medications on his own.  Appointment scheduled with Dr PHenrene Pastorfor later this month.  Confirmed phone number for appointment reminder and gave him a printed appointment reminder. 8- Recommended COVID Booster when available  I have personally reviewed and noted the following in the patient's chart:   Medical and social history Use of alcohol, tobacco or illicit drugs  Current medications and supplements including opioid prescriptions. Patient is not currently taking opioid prescriptions. Functional ability and status Nutritional status Physical activity Advanced directives List of other physicians Hospitalizations, surgeries, and ER visits in previous 12 months Vitals Screenings to include cognitive, depression, and falls Referrals and appointments  In addition, I have reviewed and discussed with patient certain preventive protocols, quality metrics, and best practice recommendations. A written personalized care plan for preventive services as well as general preventive health recommendations were provided to patient.     KErie Noe LPN   [id number]

## 2021-08-21 DIAGNOSIS — C44622 Squamous cell carcinoma of skin of right upper limb, including shoulder: Secondary | ICD-10-CM | POA: Diagnosis not present

## 2021-08-28 ENCOUNTER — Other Ambulatory Visit: Payer: Self-pay

## 2021-08-28 ENCOUNTER — Ambulatory Visit (INDEPENDENT_AMBULATORY_CARE_PROVIDER_SITE_OTHER): Payer: Medicare HMO | Admitting: Legal Medicine

## 2021-08-28 ENCOUNTER — Encounter: Payer: Self-pay | Admitting: Legal Medicine

## 2021-08-28 VITALS — BP 138/70 | HR 99 | Temp 97.4°F | Ht 69.0 in | Wt 183.7 lb

## 2021-08-28 DIAGNOSIS — E1121 Type 2 diabetes mellitus with diabetic nephropathy: Secondary | ICD-10-CM

## 2021-08-28 DIAGNOSIS — I82629 Acute embolism and thrombosis of deep veins of unspecified upper extremity: Secondary | ICD-10-CM

## 2021-08-28 DIAGNOSIS — G8191 Hemiplegia, unspecified affecting right dominant side: Secondary | ICD-10-CM

## 2021-08-28 DIAGNOSIS — R413 Other amnesia: Secondary | ICD-10-CM

## 2021-08-28 DIAGNOSIS — I679 Cerebrovascular disease, unspecified: Secondary | ICD-10-CM

## 2021-08-28 DIAGNOSIS — E1159 Type 2 diabetes mellitus with other circulatory complications: Secondary | ICD-10-CM | POA: Diagnosis not present

## 2021-08-28 DIAGNOSIS — E782 Mixed hyperlipidemia: Secondary | ICD-10-CM | POA: Diagnosis not present

## 2021-08-28 DIAGNOSIS — I10 Essential (primary) hypertension: Secondary | ICD-10-CM

## 2021-08-28 MED ORDER — ROSUVASTATIN CALCIUM 40 MG PO TABS: 40.0000 mg | ORAL_TABLET | Freq: Every day | ORAL | 1 refills | Status: DC

## 2021-08-28 MED ORDER — METFORMIN HCL 500 MG PO TABS: 500.0000 mg | ORAL_TABLET | ORAL | 2 refills | Status: DC

## 2021-08-28 MED ORDER — LISINOPRIL 10 MG PO TABS: 5.0000 mg | ORAL_TABLET | Freq: Every day | ORAL | 2 refills | Status: DC

## 2021-08-28 NOTE — Progress Notes (Signed)
Established Patient Office Visit  Subjective:  Patient ID: Jon Nelson, male    DOB: [birth year]  Age: 75 y.o. MRN: [id number]  CC:  Chief Complaint  Patient presents with   Diabetes   Hyperlipidemia   Hypertension    HPI Jon Nelson presents for chronic visit. He is a poor historian and he may not be taking his medicines.  I could not get a straight answereer  Patient present with type 2 diabetes.  Specifically, this is type 2, noninsulin requiring diabetes, complicated by neuropathy.  Compliance with treatment has been good; patient take medicines as directed, maintains diet and exercise regimen, follows up as directed, and is keeping glucose diary.  Date of  diagnosis 01-31-09.  Depression screen has been performed.Tobacco screen nonsmoker. Current medicines for diabetes metformin.  Patient is on lisinopril for renal protection and crestor for cholesterol control.  Patient performs foot exams daily and last ophthalmologic exam was not done.   Patient presents for follow up of hypertension.  Patient tolerating lisinopril well with side effects.  Patient was diagnosed with hypertension 2009/01/31 so has been treated for hypertension for 10 years.Patient is working on maintaining diet and exercise regimen and follows up as directed. Complication include none.   Patient presents with hyperlipidemia.  Compliance with treatment has been good; patient takes medicines as directed, maintains low cholesterol diet, follows up as directed, and maintains exercise regimen.  Patient is using crestor without problems.   He is having memory problems, family has now moved in with him.  Past Medical History:  Diagnosis Date   Carotid artery stenosis    Diabetes (HCC)    GI bleed    HLD (hyperlipidemia)    HTN (hypertension)    Other amnesia    TIA (transient ischemic attack)     Past Surgical History:  Procedure Laterality Date   HEMORRHOID SURGERY     LOOP RECORDER INSERTION N/A 02/03/2019    Procedure: LOOP RECORDER INSERTION;  Surgeon: Constance Haw, MD;  Location: [facility];  Service: Cardiovascular;  Laterality: N/A;   TEE WITHOUT CARDIOVERSION N/A 02/03/2019   Procedure: TRANSESOPHAGEAL ECHOCARDIOGRAM (TEE);  Surgeon: Jerline Pain, MD;  Location: [facility];  Service: Cardiovascular;  Laterality: N/A;  loop    Family History  Problem Relation Age of Onset   Stroke Mother     Social History   Socioeconomic History   Marital status: Widowed    Spouse name: Not on file   Number of children: 0   Years of education: Not on file   Highest education level: Not on file  Occupational History   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  Patient has a sister, his niece and her family lives in his home temporarily    Social Determinants of Health   Financial Resource Strain: Not on file  Food Insecurity: Not on file  Transportation Needs: Not on file  Physical Activity: Not on file  Stress: Not on file  Social Connections: Not on file  Intimate Partner Violence: Not on file    Outpatient Medications  Prior to Visit  Medication Sig Dispense Refill   aspirin 325 MG tablet Take 325 mg by mouth daily.     Cyanocobalamin 1000 MCG/ML KIT Inject 1,000 mcg as directed every 30 (thirty) days.     donepezil (ARICEPT) 10 MG tablet TAKE ONE TABLET BY MOUTH AT BEDTIME 90 tablet 0   donepezil (ARICEPT) 5 MG tablet Take 5 mg by mouth at bedtime.     lisinopril (ZESTRIL) 10 MG tablet Take 0.5 tablets (5 mg total) by mouth daily. 90 tablet 0   metFORMIN (GLUCOPHAGE) 500 MG tablet Take 500 mg by mouth every morning.      rosuvastatin (CRESTOR) 40 MG tablet Take 1 tablet (40 mg total) by mouth daily. 90 tablet 1   No facility-administered medications prior to visit.    Allergies  Allergen Reactions   Aricept [Donepezil] Nausea Only    ROS Review of Systems  Constitutional:  Negative for chills, fatigue and fever.  HENT:  Negative for congestion, ear pain and sore throat.   Respiratory:  Negative for cough and shortness of breath.   Cardiovascular:  Negative for chest pain.  Gastrointestinal:  Negative for abdominal pain, constipation, diarrhea, nausea and vomiting.  Endocrine: Negative for polydipsia, polyphagia and polyuria.  Genitourinary:  Negative for dysuria and frequency.  Musculoskeletal:  Negative for arthralgias and myalgias.  Neurological:  Negative for dizziness and headaches.  Psychiatric/Behavioral:  Negative for dysphoric mood.        No dysphoria     Objective:    Physical Exam Vitals reviewed.  Constitutional:      General: He is not in acute distress.    Appearance: Normal appearance.  HENT:     Head: Normocephalic.     Right Ear: Tympanic membrane, ear canal and external ear normal.     Left Ear: Tympanic membrane, ear canal and external ear normal.     Mouth/Throat:     Mouth: Mucous membranes are moist.     Pharynx: Oropharynx is clear.  Eyes:     Extraocular Movements: Extraocular movements intact.     Conjunctiva/sclera: Conjunctivae normal.     Pupils: Pupils are equal, round, and reactive to light.  Cardiovascular:     Rate and Rhythm: Normal rate and regular rhythm.     Pulses: Normal pulses.     Heart sounds: Normal heart sounds. No murmur heard.   No gallop.  Pulmonary:     Effort: Pulmonary effort is normal. No respiratory distress.     Breath sounds: No wheezing.  Abdominal:     General: Abdomen is flat. Bowel sounds are normal. There is no distension.     Palpations: Abdomen is soft.     Tenderness: There is no abdominal tenderness.  Musculoskeletal:         General: Normal range of motion.     Cervical back: Normal range of motion.     Comments: As per diabetic foot exam  Skin:    General: Skin is warm.     Capillary Refill: Capillary refill takes less than 2 seconds.  Neurological:     Mental Status: He is alert and oriented to person, place, and time.     Sensory: Sensory deficit (no sensation in feet) present.     Gait: Gait normal.     Deep Tendon Reflexes: Reflexes normal.     Comments: Minimal weakness right side    BP 138/70   Pulse 99   Temp (!) 97.4 F (36.3 C)  Ht _0  (1.753 m)   Wt 183 lb 11.2 oz (83.3 kg)   SpO2 98%   BMI 27.13 kg/m  Wt Readings from Last 3 Encounters:  08/28/21 183 lb 11.2 oz (83.3 kg)  08/15/21 184 lb 6.4 oz (83.6 kg)  06/28/20 185 lb (83.9 kg)     Health Maintenance Due  Topic Date Due   OPHTHALMOLOGY EXAM  Never done   Zoster Vaccines- Shingrix (1 of 2) Never done   TETANUS/TDAP  12/17/1999   COVID-19 Vaccine (3 - Moderna risk series) 03/08/2020   HEMOGLOBIN A1C  12/29/2020    There are no preventive care reminders to display for this patient.  No results found for: TSH Lab Results  Component Value Date   WBC 10.3 06/28/2020   HGB 15.4 06/28/2020   HCT 44.1 06/28/2020   MCV 86 06/28/2020   PLT 321 06/28/2020   Lab Results  Component Value Date   NA 137 06/28/2020   K 4.7 06/28/2020   CO2 20 06/28/2020   GLUCOSE 125 (H) 06/28/2020   BUN 11 06/28/2020   CREATININE 0.64 (L) 06/28/2020   BILITOT 0.5 06/28/2020   ALKPHOS 93 06/28/2020   AST 16 06/28/2020   ALT 13 06/28/2020   PROT 6.6 06/28/2020   ALBUMIN 4.0 06/28/2020   CALCIUM 8.6 06/28/2020   ANIONGAP 6 02/09/2019   Lab Results  Component Value Date   CHOL 224 (H) 06/28/2020   Lab Results  Component Value Date   HDL 26 (L) 06/28/2020   Lab Results  Component Value Date   LDLCALC 143 (H) 06/28/2020   Lab Results  Component Value Date   TRIG 299 (H) 06/28/2020   Lab Results  Component Value Date    CHOLHDL 8.6 (H) 06/28/2020   Lab Results  Component Value Date   HGBA1C 6.9 (H) 06/28/2020      Assessment & Plan:   Problem List Items Addressed This Visit       Cardiovascular and Mediastinum   HTN (hypertension)   Relevant Medications   lisinopril (ZESTRIL) 10 MG tablet   rosuvastatin (CRESTOR) 40 MG tablet An individual hypertension care plan was established and reinforced today.  The patient's status was assessed using clinical findings on exam and labs or diagnostic tests. The patient's success at meeting treatment goals on disease specific evidence-based guidelines and found to be well controlled. SELF MANAGEMENT: The patient and I together assessed ways to personally work towards obtaining the recommended goals. RECOMMENDATIONS: avoid decongestants found in common cold remedies, decrease consumption of alcohol, perform routine monitoring of BP with home BP cuff, exercise, reduction of dietary salt, take medicines as prescribed, try not to miss doses and quit smoking.  Regular exercise and maintaining a healthy weight is needed.  Stress reduction may help. A CLINICAL SUMMARY including written plan identify barriers to care unique to individual due to social or financial issues.  We attempt to mutually creat solutions for individual and family understanding.     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)   Relevant Medications   lisinopril (ZESTRIL) 10 MG tablet   rosuvastatin (CRESTOR) 40 MG tablet   Other Relevant Orders   AMB Referral to [place] Patient continues to have some right sided weakness, minimal arm drift    Acute venous embolism and thrombosis of deep veins of upper extremity, unspecified laterality (HCC)   Relevant Medications   lisinopril (ZESTRIL) 10 MG tablet   rosuvastatin (CRESTOR) 40 MG tablet  Patient is doing well     Endocrine   Diabetes ([place])   Relevant Medications   lisinopril (ZESTRIL) 10 MG  tablet   metFORMIN (GLUCOPHAGE) 500 MG tablet   rosuvastatin (CRESTOR) 40 MG tablet   Other Relevant Orders   Hemoglobin A1c   AMB Referral to [place] Use Only DME Diabetic Shoe An individual care plan for diabetes was established and reinforced today.  The patient's status was assessed using clinical findings on exam, labs and diagnostic testing. Patient success at meeting goals based on disease specific evidence-based guidelines and found to be fair controlled. Medications were assessed and patient's understanding of the medical issues , including barriers were assessed. Recommend adherence to a diabetic diet, a graduated exercise program, HgbA1c level is checked quarterly, and urine microalbumin performed yearly .  Annual mono-filament sensation testing performed. Lower blood pressure and control hyperlipidemia is important. Get annual eye exams and annual flu shots and smoking cessation discussed.  Self management goals were discussed.    Diabetic nephropathy associated with type 2 diabetes mellitus (HCC)   Relevant Medications   lisinopril (ZESTRIL) 10 MG tablet   metFORMIN (GLUCOPHAGE) 500 MG tablet   rosuvastatin (CRESTOR) 40 MG tablet   Other Relevant Orders   AMB Referral to [place] Use Only DME Diabetic Shoe An individual care plan for diabetes was established and reinforced today.  The patient's status was assessed using clinical findings on exam, labs and diagnostic testing. Patient success at meeting goals based on disease specific evidence-based guidelines and found to be fair controlled. Medications were assessed and patient's understanding of the medical issues , including barriers were assessed. Recommend adherence to a diabetic diet, a graduated exercise program, HgbA1c level is checked quarterly, and urine microalbumin performed yearly .  Annual mono-filament sensation testing performed. Lower blood pressure and  control hyperlipidemia is important. Get annual eye exams and annual flu shots and smoking cessation discussed.  Self management goals were discussed.      Other   Hyperlipidemia   Relevant Medications   lisinopril (ZESTRIL) 10 MG tablet   rosuvastatin (CRESTOR) 40 MG tablet   Other Relevant Orders   Lipid panel   AMB Referral to [place] for hyperlipidemia/ cholesterol was established and reinforced today.  The patient's status was assessed using clinical findings on exam, lab and other diagnostic tests. The patient's disease status was assessed based on evidence-based guidelines and found to be fair controlled. MEDICATIONS were reviewed. SELF MANAGEMENT GOALS have been discussed and patient's success at attaining the goal of low cholesterol was assessed. RECOMMENDATION given include regular exercise 3 days a week and low cholesterol/low fat diet. CLINICAL SUMMARY including written plan to identify barriers unique to the patient due to social or economic  reasons was discussed.    Memory loss   Relevant Orders   AMB Referral to [facility] MMSE is 22/30   Other Visit Diagnoses     Essential hypertension    -  Primary   Relevant Medications   lisinopril (ZESTRIL) 10 MG tablet   rosuvastatin (CRESTOR) 40 MG tablet   Other Relevant Orders   CBC with Differential   Comprehensive metabolic panel   AMB Referral to [place] An individual hypertension care plan was established and reinforced today.  The patient's status was assessed using clinical findings on exam and labs or diagnostic tests. The patient's success at meeting  treatment goals on disease specific evidence-based guidelines and found to be well controlled. SELF MANAGEMENT: The patient and I together assessed ways to personally work towards obtaining the recommended goals. RECOMMENDATIONS: avoid decongestants found in common cold remedies, decrease  consumption of alcohol, perform routine monitoring of BP with home BP cuff, exercise, reduction of dietary salt, take medicines as prescribed, try not to miss doses and quit smoking.  Regular exercise and maintaining a healthy weight is needed.  Stress reduction may help. A CLINICAL SUMMARY including written plan identify barriers to care unique to individual due to social or financial issues.  We attempt to mutually creat solutions for individual and family understanding.        Meds ordered this encounter  Medications   lisinopril (ZESTRIL) 10 MG tablet    Sig: Take 0.5 tablets (5 mg total) by mouth daily.    Dispense:  90 tablet    Refill:  2   metFORMIN (GLUCOPHAGE) 500 MG tablet    Sig: Take 1 tablet (500 mg total) by mouth every morning.    Dispense:  90 tablet    Refill:  2   rosuvastatin (CRESTOR) 40 MG tablet    Sig: Take 1 tablet (40 mg total) by mouth daily.    Dispense:  90 tablet    Refill:  1    Follow-up: Return in about 4 months (around 12/28/2021) for diabetes Dr. cox.    Reinaldo Meeker, MD

## 2021-08-29 LAB — LIPID PANEL
Chol/HDL Ratio: 8.1 ratio — ABNORMAL HIGH (ref 0.0–5.0)
Cholesterol, Total: 226 mg/dL — ABNORMAL HIGH (ref 100–199)
HDL: 28 mg/dL — ABNORMAL LOW (ref >39)
LDL Chol Calc (NIH): 150 mg/dL — ABNORMAL HIGH (ref 0–99)
Triglycerides: 258 mg/dL — ABNORMAL HIGH (ref 0–149)
VLDL Cholesterol Cal: 48 mg/dL — ABNORMAL HIGH (ref 5–40)

## 2021-08-29 LAB — COMPREHENSIVE METABOLIC PANEL
ALT: 14 IU/L (ref 0–44)
AST: 16 IU/L (ref 0–40)
Albumin/Globulin Ratio: 1.5 (ref 1.2–2.2)
Albumin: 4 g/dL (ref 3.7–4.7)
Alkaline Phosphatase: 99 IU/L (ref 44–121)
BUN/Creatinine Ratio: 18 (ref 10–24)
BUN: 16 mg/dL (ref 8–27)
Bilirubin Total: 0.5 mg/dL (ref 0.0–1.2)
CO2: 22 mmol/L (ref 20–29)
Calcium: 8.9 mg/dL (ref 8.6–10.2)
Chloride: 101 mmol/L (ref 96–106)
Creatinine, Ser: 0.87 mg/dL (ref 0.76–1.27)
Globulin, Total: 2.6 g/dL (ref 1.5–4.5)
Glucose: 135 mg/dL — ABNORMAL HIGH (ref 70–99)
Potassium: 4.9 mmol/L (ref 3.5–5.2)
Sodium: 140 mmol/L (ref 134–144)
Total Protein: 6.6 g/dL (ref 6.0–8.5)
eGFR: 90 mL/min/{1.73_m2} (ref >59)

## 2021-08-29 LAB — CBC WITH DIFFERENTIAL/PLATELET
Basophils Absolute: 0.1 10*3/uL (ref 0.0–0.2)
Basos: 1 %
EOS (ABSOLUTE): 0.2 10*3/uL (ref 0.0–0.4)
Eos: 2 %
Hematocrit: 44.7 % (ref 37.5–51.0)
Hemoglobin: 15 g/dL (ref 13.0–17.7)
Immature Grans (Abs): 0.1 10*3/uL (ref 0.0–0.1)
Immature Granulocytes: 1 %
Lymphocytes Absolute: 2.9 10*3/uL (ref 0.7–3.1)
Lymphs: 30 %
MCH: 29.4 pg (ref 26.6–33.0)
MCHC: 33.6 g/dL (ref 31.5–35.7)
MCV: 88 fL (ref 79–97)
Monocytes Absolute: 1.1 10*3/uL — ABNORMAL HIGH (ref 0.1–0.9)
Monocytes: 11 %
Neutrophils Absolute: 5.4 10*3/uL (ref 1.4–7.0)
Neutrophils: 55 %
Platelets: 358 10*3/uL (ref 150–450)
RBC: 5.1 x10E6/uL (ref 4.14–5.80)
RDW: 12.8 % (ref 11.6–15.4)
WBC: 9.7 10*3/uL (ref 3.4–10.8)

## 2021-08-29 LAB — HEMOGLOBIN A1C
Est. average glucose Bld gHb Est-mCnc: 163 mg/dL
Hgb A1c MFr Bld: 7.3 % — ABNORMAL HIGH (ref 4.8–5.6)

## 2021-08-29 LAB — CARDIOVASCULAR RISK ASSESSMENT

## 2021-08-29 NOTE — Progress Notes (Signed)
Cbc normal, glucose 135, kidney tests normal, liver tests normal, cholesterol high, triglycerides 258, LDL cholesterol 150 is he remembering to take his crestor? lp

## 2021-09-05 DIAGNOSIS — C44321 Squamous cell carcinoma of skin of nose: Secondary | ICD-10-CM | POA: Diagnosis not present

## 2021-09-14 ENCOUNTER — Ambulatory Visit: Payer: Medicare HMO

## 2021-09-14 NOTE — Progress Notes (Unsigned)
outside

## 2021-09-17 DIAGNOSIS — Z20822 Contact with and (suspected) exposure to covid-19: Secondary | ICD-10-CM | POA: Diagnosis not present

## 2021-09-17 DIAGNOSIS — E7800 Pure hypercholesterolemia, unspecified: Secondary | ICD-10-CM | POA: Diagnosis not present

## 2021-09-17 DIAGNOSIS — I214 Non-ST elevation (NSTEMI) myocardial infarction: Secondary | ICD-10-CM | POA: Diagnosis not present

## 2021-09-17 DIAGNOSIS — M1712 Unilateral primary osteoarthritis, left knee: Secondary | ICD-10-CM | POA: Diagnosis not present

## 2021-09-17 DIAGNOSIS — M79631 Pain in right forearm: Secondary | ICD-10-CM | POA: Diagnosis not present

## 2021-09-17 DIAGNOSIS — E785 Hyperlipidemia, unspecified: Secondary | ICD-10-CM | POA: Diagnosis not present

## 2021-09-17 DIAGNOSIS — I255 Ischemic cardiomyopathy: Secondary | ICD-10-CM | POA: Diagnosis not present

## 2021-09-17 DIAGNOSIS — E119 Type 2 diabetes mellitus without complications: Secondary | ICD-10-CM | POA: Diagnosis not present

## 2021-09-17 DIAGNOSIS — I249 Acute ischemic heart disease, unspecified: Secondary | ICD-10-CM | POA: Diagnosis not present

## 2021-09-17 DIAGNOSIS — I517 Cardiomegaly: Secondary | ICD-10-CM | POA: Diagnosis not present

## 2021-09-17 DIAGNOSIS — Z79899 Other long term (current) drug therapy: Secondary | ICD-10-CM | POA: Diagnosis not present

## 2021-09-17 DIAGNOSIS — Z9114 Patient's other noncompliance with medication regimen: Secondary | ICD-10-CM | POA: Diagnosis not present

## 2021-09-17 DIAGNOSIS — M25562 Pain in left knee: Secondary | ICD-10-CM | POA: Diagnosis not present

## 2021-09-17 DIAGNOSIS — Z8673 Personal history of transient ischemic attack (TIA), and cerebral infarction without residual deficits: Secondary | ICD-10-CM | POA: Diagnosis not present

## 2021-09-17 DIAGNOSIS — I69911 Memory deficit following unspecified cerebrovascular disease: Secondary | ICD-10-CM | POA: Diagnosis not present

## 2021-09-17 DIAGNOSIS — R079 Chest pain, unspecified: Secondary | ICD-10-CM | POA: Diagnosis not present

## 2021-09-17 DIAGNOSIS — I10 Essential (primary) hypertension: Secondary | ICD-10-CM | POA: Diagnosis not present

## 2021-09-18 ENCOUNTER — Other Ambulatory Visit: Payer: Self-pay

## 2021-09-18 ENCOUNTER — Encounter (HOSPITAL_COMMUNITY)
Admission: AD | Disposition: A | Discharge status: Home / Self Care | Payer: Self-pay | Source: Other Acute Inpatient Hospital | Attending: Interventional Cardiology

## 2021-09-18 ENCOUNTER — Inpatient Hospital Stay (HOSPITAL_COMMUNITY)
Admission: AD | Admit: 2021-09-18 | Discharge: 2021-09-28 | DRG: 234 | Disposition: A | Discharge status: Home / Self Care | Payer: Medicare HMO | Source: Other Acute Inpatient Hospital | Attending: Thoracic Surgery (Cardiothoracic Vascular Surgery) | Admitting: Thoracic Surgery (Cardiothoracic Vascular Surgery)

## 2021-09-18 DIAGNOSIS — Z7984 Long term (current) use of oral hypoglycemic drugs: Secondary | ICD-10-CM | POA: Diagnosis not present

## 2021-09-18 DIAGNOSIS — Z01818 Encounter for other preprocedural examination: Secondary | ICD-10-CM | POA: Diagnosis not present

## 2021-09-18 DIAGNOSIS — Z823 Family history of stroke: Secondary | ICD-10-CM

## 2021-09-18 DIAGNOSIS — I351 Nonrheumatic aortic (valve) insufficiency: Secondary | ICD-10-CM | POA: Diagnosis not present

## 2021-09-18 DIAGNOSIS — E871 Hypo-osmolality and hyponatremia: Secondary | ICD-10-CM | POA: Diagnosis not present

## 2021-09-18 DIAGNOSIS — I25110 Atherosclerotic heart disease of native coronary artery with unstable angina pectoris: Secondary | ICD-10-CM | POA: Diagnosis present

## 2021-09-18 DIAGNOSIS — E119 Type 2 diabetes mellitus without complications: Secondary | ICD-10-CM | POA: Diagnosis not present

## 2021-09-18 DIAGNOSIS — Z888 Allergy status to other drugs, medicaments and biological substances status: Secondary | ICD-10-CM

## 2021-09-18 DIAGNOSIS — Z8673 Personal history of transient ischemic attack (TIA), and cerebral infarction without residual deficits: Secondary | ICD-10-CM | POA: Diagnosis not present

## 2021-09-18 DIAGNOSIS — I517 Cardiomegaly: Secondary | ICD-10-CM | POA: Diagnosis not present

## 2021-09-18 DIAGNOSIS — I255 Ischemic cardiomyopathy: Secondary | ICD-10-CM | POA: Diagnosis not present

## 2021-09-18 DIAGNOSIS — I248 Other forms of acute ischemic heart disease: Secondary | ICD-10-CM | POA: Diagnosis not present

## 2021-09-18 DIAGNOSIS — J9811 Atelectasis: Secondary | ICD-10-CM | POA: Diagnosis not present

## 2021-09-18 DIAGNOSIS — J90 Pleural effusion, not elsewhere classified: Secondary | ICD-10-CM | POA: Diagnosis not present

## 2021-09-18 DIAGNOSIS — Z7982 Long term (current) use of aspirin: Secondary | ICD-10-CM | POA: Diagnosis not present

## 2021-09-18 DIAGNOSIS — D62 Acute posthemorrhagic anemia: Secondary | ICD-10-CM | POA: Diagnosis not present

## 2021-09-18 DIAGNOSIS — I2510 Atherosclerotic heart disease of native coronary artery without angina pectoris: Secondary | ICD-10-CM | POA: Diagnosis present

## 2021-09-18 DIAGNOSIS — I214 Non-ST elevation (NSTEMI) myocardial infarction: Principal | ICD-10-CM

## 2021-09-18 DIAGNOSIS — Z01810 Encounter for preprocedural cardiovascular examination: Secondary | ICD-10-CM | POA: Diagnosis not present

## 2021-09-18 DIAGNOSIS — I10 Essential (primary) hypertension: Secondary | ICD-10-CM | POA: Diagnosis not present

## 2021-09-18 DIAGNOSIS — I6529 Occlusion and stenosis of unspecified carotid artery: Secondary | ICD-10-CM | POA: Diagnosis not present

## 2021-09-18 DIAGNOSIS — E8770 Fluid overload, unspecified: Secondary | ICD-10-CM | POA: Diagnosis not present

## 2021-09-18 DIAGNOSIS — Z9889 Other specified postprocedural states: Secondary | ICD-10-CM | POA: Diagnosis not present

## 2021-09-18 DIAGNOSIS — I249 Acute ischemic heart disease, unspecified: Secondary | ICD-10-CM | POA: Diagnosis not present

## 2021-09-18 DIAGNOSIS — Z79899 Other long term (current) drug therapy: Secondary | ICD-10-CM

## 2021-09-18 DIAGNOSIS — J939 Pneumothorax, unspecified: Secondary | ICD-10-CM | POA: Diagnosis not present

## 2021-09-18 DIAGNOSIS — I2584 Coronary atherosclerosis due to calcified coronary lesion: Secondary | ICD-10-CM | POA: Diagnosis not present

## 2021-09-18 DIAGNOSIS — I959 Hypotension, unspecified: Secondary | ICD-10-CM | POA: Diagnosis not present

## 2021-09-18 DIAGNOSIS — E785 Hyperlipidemia, unspecified: Secondary | ICD-10-CM | POA: Diagnosis present

## 2021-09-18 DIAGNOSIS — Z951 Presence of aortocoronary bypass graft: Secondary | ICD-10-CM | POA: Diagnosis not present

## 2021-09-18 DIAGNOSIS — I69351 Hemiplegia and hemiparesis following cerebral infarction affecting right dominant side: Secondary | ICD-10-CM

## 2021-09-18 DIAGNOSIS — Z4682 Encounter for fitting and adjustment of non-vascular catheter: Secondary | ICD-10-CM | POA: Diagnosis not present

## 2021-09-18 HISTORY — PX: LEFT HEART CATH AND CORONARY ANGIOGRAPHY: CATH118249

## 2021-09-18 LAB — GLUCOSE, CAPILLARY: Glucose-Capillary: 282 mg/dL — ABNORMAL HIGH (ref 70–99)

## 2021-09-18 SURGERY — LEFT HEART CATH AND CORONARY ANGIOGRAPHY
Anesthesia: LOCAL

## 2021-09-18 MED ORDER — ASPIRIN 81 MG PO CHEW: 81.0000 mg | CHEWABLE_TABLET | Freq: Every day | ORAL | Status: DC

## 2021-09-18 MED ORDER — HEPARIN SODIUM (PORCINE) 1000 UNIT/ML IJ SOLN
INTRAMUSCULAR | Status: AC
  Filled 2021-09-18: qty 1

## 2021-09-18 MED ORDER — MIDAZOLAM HCL 2 MG/2ML IJ SOLN
INTRAMUSCULAR | Status: AC
  Filled 2021-09-18: qty 2

## 2021-09-18 MED ORDER — IOHEXOL 350 MG/ML SOLN
INTRAVENOUS | Status: DC | PRN
  Administered 2021-09-18: 65 mL

## 2021-09-18 MED ORDER — ASPIRIN EC 81 MG PO TBEC
81.0000 mg | DELAYED_RELEASE_TABLET | Freq: Every day | ORAL | Status: DC
Start: 2021-09-19 — End: 2021-09-24
  Administered 2021-09-19 – 2021-09-23 (×5): 81 mg via ORAL
  Filled 2021-09-18 (×5): qty 1

## 2021-09-18 MED ORDER — FENTANYL CITRATE (PF) 100 MCG/2ML IJ SOLN
INTRAMUSCULAR | Status: DC | PRN
  Administered 2021-09-18: 25 ug via INTRAVENOUS

## 2021-09-18 MED ORDER — HEPARIN SODIUM (PORCINE) 1000 UNIT/ML IJ SOLN
INTRAMUSCULAR | Status: DC | PRN
  Administered 2021-09-18: 4000 [IU] via INTRAVENOUS

## 2021-09-18 MED ORDER — SODIUM CHLORIDE 0.9 % IV SOLN: 250.0000 mL | INTRAVENOUS | Status: DC | PRN

## 2021-09-18 MED ORDER — LIDOCAINE HCL (PF) 1 % IJ SOLN
INTRAMUSCULAR | Status: AC
  Filled 2021-09-18: qty 30

## 2021-09-18 MED ORDER — NITROGLYCERIN 0.4 MG SL SUBL: 0.4000 mg | SUBLINGUAL_TABLET | SUBLINGUAL | Status: DC | PRN

## 2021-09-18 MED ORDER — VERAPAMIL HCL 2.5 MG/ML IV SOLN
INTRAVENOUS | Status: AC
  Filled 2021-09-18: qty 2

## 2021-09-18 MED ORDER — ROSUVASTATIN CALCIUM 20 MG PO TABS
40.0000 mg | ORAL_TABLET | Freq: Every day | ORAL | Status: DC
  Administered 2021-09-19 – 2021-09-28 (×9): 40 mg via ORAL
  Filled 2021-09-18 (×9): qty 2

## 2021-09-18 MED ORDER — INSULIN ASPART 100 UNIT/ML IJ SOLN
0.0000 [IU] | Freq: Three times a day (TID) | INTRAMUSCULAR | Status: DC
Start: 2021-09-19 — End: 2021-09-24
  Administered 2021-09-19: 5 [IU] via SUBCUTANEOUS
  Administered 2021-09-19: 3 [IU] via SUBCUTANEOUS
  Administered 2021-09-19 – 2021-09-20 (×3): 5 [IU] via SUBCUTANEOUS
  Administered 2021-09-20: 2 [IU] via SUBCUTANEOUS
  Administered 2021-09-21: 8 [IU] via SUBCUTANEOUS
  Administered 2021-09-21: 5 [IU] via SUBCUTANEOUS
  Administered 2021-09-21: 3 [IU] via SUBCUTANEOUS
  Administered 2021-09-22: 5 [IU] via SUBCUTANEOUS
  Administered 2021-09-22: 8 [IU] via SUBCUTANEOUS
  Administered 2021-09-22: 5 [IU] via SUBCUTANEOUS
  Administered 2021-09-23: 8 [IU] via SUBCUTANEOUS
  Administered 2021-09-23 (×2): 3 [IU] via SUBCUTANEOUS

## 2021-09-18 MED ORDER — LIDOCAINE HCL (PF) 1 % IJ SOLN
INTRAMUSCULAR | Status: DC | PRN
  Administered 2021-09-18: 2 mL

## 2021-09-18 MED ORDER — SODIUM CHLORIDE 0.9% FLUSH
3.0000 mL | Freq: Two times a day (BID) | INTRAVENOUS | Status: DC
  Administered 2021-09-18: 3 mL via INTRAVENOUS

## 2021-09-18 MED ORDER — NITROGLYCERIN IN D5W 200-5 MCG/ML-% IV SOLN
3.0000 ug/min | INTRAVENOUS | Status: DC
  Administered 2021-09-18: 3 ug/min via INTRAVENOUS
  Filled 2021-09-18: qty 250

## 2021-09-18 MED ORDER — HEPARIN (PORCINE) IN NACL 1000-0.9 UT/500ML-% IV SOLN
INTRAVENOUS | Status: AC
  Filled 2021-09-18: qty 1000

## 2021-09-18 MED ORDER — ACETAMINOPHEN 325 MG PO TABS: 650.0000 mg | ORAL_TABLET | ORAL | Status: DC | PRN

## 2021-09-18 MED ORDER — HEPARIN (PORCINE) 25000 UT/250ML-% IV SOLN
1600.0000 [IU]/h | INTRAVENOUS | Status: DC
  Administered 2021-09-19 (×2): 1100 [IU]/h via INTRAVENOUS
  Administered 2021-09-20: 1400 [IU]/h via INTRAVENOUS
  Administered 2021-09-21 (×2): 1500 [IU]/h via INTRAVENOUS
  Administered 2021-09-22 – 2021-09-24 (×3): 1600 [IU]/h via INTRAVENOUS
  Filled 2021-09-18 (×8): qty 250

## 2021-09-18 MED ORDER — ONDANSETRON HCL 4 MG/2ML IJ SOLN: 4.0000 mg | Freq: Four times a day (QID) | INTRAMUSCULAR | Status: DC | PRN

## 2021-09-18 MED ORDER — SODIUM CHLORIDE 0.9 % IV SOLN: INTRAVENOUS | Status: AC

## 2021-09-18 MED ORDER — HYDRALAZINE HCL 20 MG/ML IJ SOLN: 10.0000 mg | INTRAMUSCULAR | Status: AC | PRN

## 2021-09-18 MED ORDER — HEPARIN (PORCINE) IN NACL 1000-0.9 UT/500ML-% IV SOLN
INTRAVENOUS | Status: DC | PRN
  Administered 2021-09-18 (×2): 500 mL

## 2021-09-18 MED ORDER — ATORVASTATIN CALCIUM 80 MG PO TABS: 80.0000 mg | ORAL_TABLET | Freq: Every day | ORAL | Status: DC

## 2021-09-18 MED ORDER — FENTANYL CITRATE (PF) 100 MCG/2ML IJ SOLN
INTRAMUSCULAR | Status: AC
  Filled 2021-09-18: qty 2

## 2021-09-18 MED ORDER — SODIUM CHLORIDE 0.9% FLUSH: 3.0000 mL | INTRAVENOUS | Status: DC | PRN

## 2021-09-18 MED ORDER — VERAPAMIL HCL 2.5 MG/ML IV SOLN
INTRAVENOUS | Status: DC | PRN
  Administered 2021-09-18: 10 mL via INTRA_ARTERIAL

## 2021-09-18 MED ORDER — MIDAZOLAM HCL 2 MG/2ML IJ SOLN
INTRAMUSCULAR | Status: DC | PRN
  Administered 2021-09-18: 1 mg via INTRAVENOUS

## 2021-09-18 MED ORDER — LABETALOL HCL 5 MG/ML IV SOLN: 10.0000 mg | INTRAVENOUS | Status: AC | PRN

## 2021-09-18 SURGICAL SUPPLY — 10 items
CATH 5FR JL3.5 JR4 ANG PIG MP (CATHETERS) ×2 IMPLANT
DEVICE RAD COMP TR BAND LRG (VASCULAR PRODUCTS) ×2 IMPLANT
GLIDESHEATH SLEND A-KIT 6F 22G (SHEATH) ×2 IMPLANT
GUIDEWIRE INQWIRE 1.5J.035X260 (WIRE) ×1 IMPLANT
INQWIRE 1.5J .035X260CM (WIRE) ×2
KIT HEART LEFT (KITS) ×2 IMPLANT
PACK CARDIAC CATHETERIZATION (CUSTOM PROCEDURE TRAY) ×2 IMPLANT
SHEATH PROBE COVER 6X72 (BAG) ×2 IMPLANT
TRANSDUCER W/STOPCOCK (MISCELLANEOUS) ×2 IMPLANT
TUBING CIL FLEX 10 FLL-RA (TUBING) ×2 IMPLANT

## 2021-09-18 NOTE — Progress Notes (Signed)
ANTICOAGULATION CONSULT NOTE - Initial Consult  Pharmacy Consult for IV heparin Indication: CAD awaiting surgical consult  Allergies  Allergen Reactions   Aricept [Donepezil] Nausea Only    Patient Measurements:   Heparin Dosing Weight: 83 kg  Vital Signs: BP: 108/50 (10/18 1720) Pulse Rate: 52 (10/18 1720)  Labs: No results for input(s): HGB, HCT, PLT, APTT, LABPROT, INR, HEPARINUNFRC, HEPRLOWMOCWT, CREATININE, CKTOTAL, CKMB, TROPONINIHS in the last 72 hours.  CrCl cannot be calculated (Patient's most recent lab result is older than the maximum 21 days allowed.).   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  Pharmacy asked to start IV heparin while awaiting consult for OHS.  Hx GIB in the past, no known active bleeding.  Asked to start heparin 8 hrs after sheath removed (pulled at 1710 PM).  Baseline labs in process.  Goal of Therapy:  Heparin level 0.3-0.7 units/ml Monitor platelets by anticoagulation protocol: Yes   Plan:  Start IV heparin at 0100 AM (10/19) at rate of 1100 units/hr. Check heparin level 8 hrs after gtt starts Daily heparin level and CBC. F/u plans for cardiac surgery.  [place] Crane, Roylene Reason, BCCP Clinical Pharmacist  09/18/2021 5:59 PM   [facility] pharmacy phone numbers are listed on [email or web address]

## 2021-09-18 NOTE — Progress Notes (Signed)
TR BAND REMOVAL  LOCATION:    right radial  DEFLATED PER PROTOCOL:    Yes.    TIME BAND OFF / DRESSING APPLIED:    1935    SITE UPON ARRIVAL:    Level 0  SITE AFTER BAND REMOVAL:    Level 0  CIRCULATION SENSATION AND MOVEMENT:    Within Normal Limits   Yes.    COMMENTS:   post TRB care and instruction given. Tolerated procedure well.

## 2021-09-18 NOTE — CV Procedure (Signed)
3VCAD LV dysfunction.

## 2021-09-18 NOTE — H&P (Addendum)
The patient has been seen in conjunction with Sande Rives, PAC. All aspects of care have been considered and discussed. The patient has been personally interviewed, examined, and all clinical data has been reviewed.  Recent onset right arm and chest discomfort, dyspnea on exertion, and dynamic EKG ST-T wave changes.  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  Some right arm weakness.  Background problems include primary hypertension, hyperlipidemia, diabetes mellitus type 2.  Currently pain-free.  Has orthopnea and PND Exam is unremarkable.  Vitals in the Cath Lab include heart rate 50 bpm and BP 114/62 mmHg.  Neck veins are flat.  Lungs are clear.  Abdomen protuberant but soft.  Radial, carotid, femoral, and dorsalis pedis are 2+ and symmetric. EKG with marked precordial T wave inversion, new since admitting EKG. Mildly elevated troponins. Echocardiogram done at [facility] suggests anterior wall motion abnormality possibly consistent with LAD disease.  Rule out stress cardiomyopathy.  CONCLUSIONS: Acute coronary syndrome  Recommendation: Coronary angiography; preventive therapy; further management will be dependent upon findings.    Cardiology Admission History and Physical:   Patient ID: Jon Nelson MRN: [id number]; DOB: [birth year]   Admission date: 09/18/2021  PCP:  Rochel Brome, MD   [place] Prov[facility] Cardiologist: New (Dr. Fraser Din)  Chief Complaint: Right arm pain/ NSTEMI  Patient Profile:   Jon Nelson is a 75 y.o. male with a history of hypertension, hyperlipidemia, diabetes, and prior stroke in 01/2019 s/p loop recorder which showed no evidence of atrial fibrillation who presented to [facility] ED on 09/17/2021 with right arm pain and ruled in for non-STEMI.  He was transferred to [facility] on 09/18/2021 for further evaluation with cardiac catheterization.  History of Present Illness:   Jon Nelson is a 75 year old male with above history.  No known  cardiac history. He has known hypertension, hyperlipidemia, and diabetes. He is on Lisinopril and Crestor at home but has not been taking Aspirin or any diabetes medications. He notes residual mild right sided weakness from prior stroke. He denies any smoking history.   Patient was in his usual state of health until 09/15/2021 when he had sudden onset of right arm pain. This resolved on its own. He had recurrent right arm pain on the evening of 09/17/2021 as well as some mild chest pain with it, diaphoresis, nausea/vomiting. This prompted him to go to the ED. He denies any symptoms like this before hand but does state he thinks he has been pushing himself to hard. No shortness of breath, orthopnea, PND, lower extremity edema, palpitations, lightheadedness, dizziness, or syncope.    In the [facility] ED, EKG showed normal sinus rhythm, rate 91 bpm, with possible Q waves in V1-V3 and T wave inversions in lead aVL. Troponin I elevated at 0.88. Pro-BNP normal. Chest x-ray showed no acute findings. WBC 11.9, Hgb 16.5, Plts 360. Na 137, K 5.0, Glucose 299, BUN 13, Cr 0.70. LFTs normal.  Patient was admitted for NSTEMI and started on aspirin and metoprolol. Repeat EKG on 09/18/2021 showed evolving T wave changes with clear Q waves in V1-V3 and deep T wave inversions in anterolateral leads.  Repeat troponin markedly elevated at 14.00 >> 16.30.  He was started on IV Heparin.  Echo reportedly showed segmental wall motion abnormality in distribution of LAD.  He was seen by Dr. Agustin Cree with our group incision was made to transfer patient to [facility] for cardiac catheterization.  Upon arrival to [facility], he denies any chest/arm pain.   Past  Medical History:  Diagnosis Date   Carotid artery stenosis    Diabetes (HCC)    GI bleed    HLD (hyperlipidemia)    HTN (hypertension)    Other amnesia    TIA (transient ischemic attack)     Past Surgical History:  Procedure Laterality Date   HEMORRHOID SURGERY      LOOP RECORDER INSERTION N/A 02/03/2019   Procedure: LOOP RECORDER INSERTION;  Surgeon: Constance Haw, MD;  Location: [place] CV LAB;  Service: Cardiovascular;  Laterality: N/A;   TEE WITHOUT CARDIOVERSION N/A 02/03/2019   Procedure: TRANSESOPHAGEAL ECHOCARDIOGRAM (TEE);  Surgeon: Jerline Pain, MD;  Location: [facility] ENDOSCOPY;  Service: Cardiovascular;  Laterality: N/A;  loop     Medications Prior to Admission: Prior to Admission medications   Medication Sig Start Date End Date Taking? Authorizing Provider  aspirin 325 MG tablet Take 325 mg by mouth daily.    [provider]  Cyanocobalamin 1000 MCG/ML KIT Inject 1,000 mcg as directed every 30 (thirty) days.    [provider]  lisinopril (ZESTRIL) 10 MG tablet Take 0.5 tablets (5 mg total) by mouth daily. 08/28/21   Jon Anes, MD  metFORMIN (GLUCOPHAGE) 500 MG tablet Take 1 tablet (500 mg total) by mouth every morning. 08/28/21   Jon Anes, MD  rosuvastatin (CRESTOR) 40 MG tablet Take 1 tablet (40 mg total) by mouth daily. 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  Patient has a sister, his niece and her family lives in his home temporarily    Social Determinants of Health   Financial  Resource Strain: Not on file  Food Insecurity: Not on file  Transportation Needs: Not on file  Physical Activity: Not on file  Stress: Not on file  Social Connections: Not on file  Intimate Partner Violence: Not on file    Family History:   The patient's family history includes Stroke in his mother.    ROS:  Please see the history of present illness.  All other ROS reviewed and negative.     Physical Exam/Data:  There were no vitals filed for this visit. No intake or output data in the 24 hours ending 09/18/21 1640 Last 3 Weights 08/28/2021 08/15/2021 06/28/2020  Weight (lbs) 183 lb 11.2 oz 184 lb 6.4 oz 185 lb  Weight (kg) 83.326 kg 83.643 kg 83.915 kg     There is no height or weight on file to calculate BMI.   Physical Exam per MD: General: 75 y.o. male resting comfortably in no acute distress. HEENT: Normocephalic and atraumatic. Sclera clear.  Neck: Supple. No carotid bruits. No JVD. Heart:  Mildly bradycardic with regular rhythm. Distinct S1 and S2. No murmurs, gallops, or rubs. Radial and distal pedal pulses 2+ and equal bilaterally. Lungs: No increased work of breathing. Clear to ausculation bilaterally. No wheezes, rhonchi, or rales.  Abdomen: Soft, non-distended, and non-tender to palpation. Bowel sounds present.  Extremities: No lower extremity edema.    Skin: Warm and dry. Neuro: Alert and oriented x3. No focal deficits. Psych: Normal affect. Responds appropriately.   EKG:  The following ECGs were personally reviewed: - EKG on 09/17/2021 demonstrated: Normal sinus rhythm, rate 91 bpm, with possible Q waves in V1-V3 and T wave inversions in lead aVL. - EKG on 09/18/2021 demonstrated: Normal sinus rhythm showed evolving T wave changes with clear Q waves in V1-V3 and deep T wave inversions in anterolateral leads  Relevant CV Studies: N/A.  Laboratory Data:  High Sensitivity Troponin:  No results for input(s): TROPONINIHS in the last 720 hours.    ChemistryNo  results for input(s): NA, K, CL, CO2, GLUCOSE, BUN, CREATININE, CALCIUM, MG, GFRNONAA, GFRAA, ANIONGAP in the last 168 hours.  No results for input(s): PROT, ALBUMIN, AST, ALT, ALKPHOS, BILITOT in the last 168 hours. Lipids No results for input(s): CHOL, TRIG, HDL, LABVLDL, LDLCALC, CHOLHDL in the last 168 hours. HematologyNo results for input(s): WBC, RBC, HGB, HCT, MCV, MCH, MCHC, RDW, PLT in the last 168 hours. Thyroid No results for input(s): TSH, FREET4 in the last 168 hours. BNPNo results for input(s): BNP, PROBNP in the last 168 hours.  DDimer No results for input(s): DDIMER in the last 168 hours.   Radiology/Studies:  No results found.   Assessment and Plan:   NSTEMI - Patient transferred from [facility] with NSTEMI with plans for cardiac catheterization. - EKG shows Q waves in leads V1-V3 and involving T wave changes in anterior lateral leads. - Troponin I elevated at 0.88 >> 14.00 >> 16.30. - Echo at [facility] reported showed wall motion abnormalities in distribution of LAD but unable to see full report. - Continue aspirin and high-intensity statin. - Started on Lopressor 90m twice daily at [facility] for now given mild bradycardia. Can likely restart after cath. - Will proceed with cardiac catheterization. The patient understands that risks include but are not limited to stroke (1 in 1000), death (1 in 117, kidney failure [usually temporary] (1 in 500), bleeding (1 in 200), allergic reaction [possibly serious] (1 in 200), and agrees to proceed.   [facility] with segmental wall motion abnormality indicating problem with known proximal LAD (unable to personally see report). - [place]ed [place] Lopressor 25 twice [place] [facility] [place] hold for now given mild bradycardia. Can hopefully restart Coreg or Toprol-XL after cath given reduced EF. - On Lisinopril at home. Will hold for now until after cath. - Will  repeat Echo here.  Hypertension - On Lisinopril at home. Will hold for now and can hopefully restart after cath.  Hyperlipidemia - Recent lipid panel on 08/28/2021: Total Cholesterol 226, Triglycerides 258, HDL 49, LDL 150. - LDL goal <70 given history of stroke and now likely CAD. - Patient was not taking his Crestor at time of last check but has since restarted. - Continue Crestor 469mdaily. - Will need repeat lipid panel and LFTs in about 1 month. If LDL still above goal, consider PCSK9 inhibitor.  Type 2 Diabetes Mellitus  - Hemoglobin A1c 7.3 on 08/28/2021. Not on any medications at home. - Will start him on sliding  scale insulin here.  - Will need to follow-up with PCP.  Risk Assessment/Risk Scores:   TIMI Risk Score for Unstable Angina or Non-ST Elevation MI:   The patient's TIMI risk score is 5, which indicates a 26% risk of all cause mortality, new or recurrent myocardial infarction or need for urgent revascularization in the next 14 days.{  Severity of Illness: The appropriate patient status for this patient is OBSERVATION. Observation status is judged to be reasonable and necessary in order to provide the required intensity of service to ensure the patient's safety. The patient's presenting symptoms, physical exam findings, and initial radiographic and laboratory data in the context of their medical condition is felt to place them at decreased risk for further clinical deterioration. Furthermore, it is anticipated that the patient will be medically stable for discharge from the hospital within 2 midnights of admission.    For questions or updates, please contact [place] Please consult [email or web address] for contact info under     Signed, Sinclair Grooms, MD  09/18/2021 4:40 PM

## 2021-09-19 ENCOUNTER — Inpatient Hospital Stay (HOSPITAL_COMMUNITY): Payer: Medicare HMO

## 2021-09-19 ENCOUNTER — Encounter (HOSPITAL_COMMUNITY): Payer: Self-pay | Admitting: Interventional Cardiology

## 2021-09-19 DIAGNOSIS — Z7982 Long term (current) use of aspirin: Secondary | ICD-10-CM

## 2021-09-19 DIAGNOSIS — Z01810 Encounter for preprocedural cardiovascular examination: Secondary | ICD-10-CM

## 2021-09-19 DIAGNOSIS — Z8673 Personal history of transient ischemic attack (TIA), and cerebral infarction without residual deficits: Secondary | ICD-10-CM

## 2021-09-19 DIAGNOSIS — I2510 Atherosclerotic heart disease of native coronary artery without angina pectoris: Secondary | ICD-10-CM

## 2021-09-19 DIAGNOSIS — E119 Type 2 diabetes mellitus without complications: Secondary | ICD-10-CM

## 2021-09-19 DIAGNOSIS — I248 Other forms of acute ischemic heart disease: Secondary | ICD-10-CM

## 2021-09-19 DIAGNOSIS — I2584 Coronary atherosclerosis due to calcified coronary lesion: Secondary | ICD-10-CM | POA: Diagnosis not present

## 2021-09-19 DIAGNOSIS — I214 Non-ST elevation (NSTEMI) myocardial infarction: Secondary | ICD-10-CM | POA: Diagnosis not present

## 2021-09-19 DIAGNOSIS — Z823 Family history of stroke: Secondary | ICD-10-CM

## 2021-09-19 DIAGNOSIS — I6529 Occlusion and stenosis of unspecified carotid artery: Secondary | ICD-10-CM

## 2021-09-19 DIAGNOSIS — I10 Essential (primary) hypertension: Secondary | ICD-10-CM | POA: Diagnosis not present

## 2021-09-19 LAB — CBC
HCT: 41.3 % (ref 39.0–52.0)
Hemoglobin: 14 g/dL (ref 13.0–17.0)
MCH: 30.3 pg (ref 26.0–34.0)
MCHC: 33.9 g/dL (ref 30.0–36.0)
MCV: 89.4 fL (ref 80.0–100.0)
Platelets: 330 10*3/uL (ref 150–400)
RBC: 4.62 MIL/uL (ref 4.22–5.81)
RDW: 13.3 % (ref 11.5–15.5)
WBC: 21.6 10*3/uL — ABNORMAL HIGH (ref 4.0–10.5)
nRBC: 0 % (ref 0.0–0.2)

## 2021-09-19 LAB — ECHOCARDIOGRAM COMPLETE
AR max vel: 1.66 cm2
AV Area VTI: 1.8 cm2
AV Area mean vel: 1.66 cm2
AV Mean grad: 4 mmHg
AV Peak grad: 8.8 mmHg
Ao pk vel: 1.48 m/s
Area-P 1/2: 4.17 cm2
Height: 69 in
S' Lateral: 4.1 cm
Weight: 2998.26 oz

## 2021-09-19 LAB — GLUCOSE, CAPILLARY
Glucose-Capillary: 196 mg/dL — ABNORMAL HIGH (ref 70–99)
Glucose-Capillary: 216 mg/dL — ABNORMAL HIGH (ref 70–99)
Glucose-Capillary: 205 mg/dL — ABNORMAL HIGH (ref 70–99)
Glucose-Capillary: 233 mg/dL — ABNORMAL HIGH (ref 70–99)
Glucose-Capillary: 234 mg/dL — ABNORMAL HIGH (ref 70–99)

## 2021-09-19 LAB — SURGICAL PCR SCREEN
MRSA, PCR: NEGATIVE
Staphylococcus aureus: NEGATIVE

## 2021-09-19 LAB — HEPARIN LEVEL (UNFRACTIONATED)
Heparin Unfractionated: 0.27 IU/mL — ABNORMAL LOW (ref 0.30–0.70)
Heparin Unfractionated: 0.14 IU/mL — ABNORMAL LOW (ref 0.30–0.70)

## 2021-09-19 MED ORDER — CARVEDILOL 3.125 MG PO TABS
3.1250 mg | ORAL_TABLET | Freq: Two times a day (BID) | ORAL | Status: DC
  Administered 2021-09-19 – 2021-09-23 (×9): 3.125 mg via ORAL
  Filled 2021-09-19 (×9): qty 1

## 2021-09-19 MED ORDER — HEPARIN BOLUS VIA INFUSION
1500.0000 [IU] | Freq: Once | INTRAVENOUS | Status: AC
  Administered 2021-09-19: 1500 [IU] via INTRAVENOUS
  Filled 2021-09-19: qty 1500

## 2021-09-19 MED ORDER — PERFLUTREN LIPID MICROSPHERE
1.0000 mL | INTRAVENOUS | Status: AC | PRN
  Administered 2021-09-19: 2 mL via INTRAVENOUS
  Filled 2021-09-19: qty 10

## 2021-09-19 NOTE — Progress Notes (Signed)
Admitted to unit from cath lab with MIVF and TR band deflated but in place. Pain and vitals assessed. Placed on continuous cardiac monitoring. Oriented to unit and staff. I&O assessed and needs attended to. Bed alarm on and callbell within reach. Will continue to monitor.

## 2021-09-19 NOTE — Progress Notes (Signed)
Progress Note  Patient Name: Jon Nelson Date of Encounter: 09/19/2021  [facility] Cardiologist: Jenne Campus, MD   Subjective   Feeling well. No chest pain, sob or palpitations.   Pending CABG evaluation.   Inpatient Medications    Scheduled Meds:  aspirin EC  81 mg Oral Daily   insulin aspart  0-15 Units Subcutaneous TID WC   rosuvastatin  40 mg Oral Daily   sodium chloride flush  3 mL Intravenous Q12H   Continuous Infusions:  sodium chloride     heparin 1,100 Units/hr (09/19/21 0136)   nitroGLYCERIN Stopped (09/18/21 2215)   PRN Meds: sodium chloride, acetaminophen, nitroGLYCERIN, ondansetron (ZOFRAN) IV, sodium chloride flush   Vital Signs    Vitals:   09/18/21 1955 09/19/21 0035 09/19/21 0452 09/19/21 0735  BP:  (!) 98/59 105/68 106/74  Pulse:  (!) 57 60 63  Resp:  13 18 16   Temp:  98 F (36.7 C) 97.8 F (36.6 C) 97.9 F (36.6 C)  TempSrc:  Oral Oral Oral  SpO2:  97% 97% 98%  Weight: 85 kg     Height: 5\' 9"  (1.753 m)       Intake/Output Summary (Last 24 hours) at 09/19/2021 0941 Last data filed at 09/19/2021 0839 Gross per 24 hour  Intake 366.54 ml  Output 860 ml  Net -493.46 ml   Last 3 Weights 09/18/2021 08/28/2021 08/15/2021  Weight (lbs) 187 lb 6.3 oz 183 lb 11.2 oz 184 lb 6.4 oz  Weight (kg) 85 kg 83.326 kg 83.643 kg      Telemetry    NSR - Personally Reviewed  ECG    Sinus bradycardia, TWI in anterior lateral leads - Personally Reviewed  Physical Exam   GEN: No acute distress.   Neck: No JVD Cardiac: RRR, no murmurs, rubs, or gallops. R radial cath site without hematoma  Respiratory: Clear to auscultation bilaterally. GI: Soft, nontender, non-distended  MS: No edema; No deformity. Neuro:  Nonfocal  Psych: Normal affect   Labs     Hematology Recent Labs  Lab 09/19/21 0322  WBC 21.6*  RBC 4.62  HGB 14.0  HCT 41.3  MCV 89.4  MCH 30.3  MCHC 33.9  RDW 13.3  PLT 330    Radiology    CARDIAC  CATHETERIZATION  Result Date: 09/18/2021 CONCLUSIONS: 99% proximal LAD within a heavily calcified segment followed by 80% mid stenosis and 95% apical stenosis.  LAD flow is TIMI grade II.  First and second diagonals have severe obstructive disease. Left main is widely patent. The circumflex is codominant.  30% proximal circumflex, 99% first obtuse marginal, 70 to 80% proximal large second obtuse marginal.  The second obtuse marginal has high-grade distal disease in a trifurcation.  The PDA of the left circumflex contains proximal to mid 90% stenosis. RCA is nondominant and contains segmental 70% stenosis before a large right ventricular branch. Anteroapical wall motion abnormality, EF 40 to 45%, apical dyskinesis, and LVEDP 14 mmHg. RECOMMENDATIONS: Surgical anatomy.  TCTS consultation is recommended. RFM.    Cardiac Studies   Pending echo    LEFT HEART CATH AND CORONARY ANGIOGRAPHY   Conclusion  CONCLUSIONS: 99% proximal LAD within a heavily calcified segment followed by 80% mid stenosis and 95% apical stenosis.  LAD flow is TIMI grade II.  First and second diagonals have severe obstructive disease. Left main is widely patent. The circumflex is codominant.  30% proximal circumflex, 99% first obtuse marginal, 70 to 80% proximal large second obtuse marginal.  The second obtuse marginal has high-grade distal disease in a trifurcation.  The PDA of the left circumflex contains proximal to mid 90% stenosis. RCA is nondominant and contains segmental 70% stenosis before a large right ventricular branch. Anteroapical wall motion abnormality, EF 40 to 45%, apical dyskinesis, and LVEDP 14 mmHg.   RECOMMENDATIONS:   Surgical anatomy.  TCTS consultation is recommended. RFM.  Diagnostic Dominance: Left   Patient Profile     75 y.o. male with a history of hypertension, hyperlipidemia, diabetes, and prior stroke in 01/2019 s/p loop recorder which showed no evidence of atrial fibrillation who  presented to [facility] ED on 09/17/2021 with right arm pain and ruled in for non-STEMI.  He was transferred to [facility] on 09/18/2021 for further evaluation with cardiac catheterization.  Assessment & Plan    NSTEMI/CAD - Troponin I elevated at 0.88 >> 14.00 >> 16.30 Outside hospital. EKG shows Q waves in leads V1-V3 and involving T wave changes in anterior lateral leads. Cardiac cath showed multivessel CAD. Pending CTCS evaluation.  - Continue ASA and statin. - Continue heparin  -Started on Lopressor 25mg  twice daily at [place], however stopped due to bradycardia in 50s. Now HR improved to 70-80s. Will re-challenge with Coreg 3.125mg  BID.   2. Ischemic cardiomyopathy - Echo at [facility] reportedly showed diminished LVEF with segmental wall motion abnormality indicating problem with known proximal LAD (unable to personally see report). - Pending echo here - BB as summarized above - Home lisinopril on hold, wait echo result - Euvolemic   3. HTN - BP currently soft - As above   4. HLD - 08/28/2021: Cholesterol, Total 226; HDL 28; LDL Chol Calc (NIH) 150; Triglycerides 258  - Continue high intensity statin  - Consider OP f/u labs 6-8 weeks given statin initiation this admission.   5. DM - HgbA1c 7.3 - Metformin on hold - SSI while here  For questions or updates, please contact [place] Please consult [email or web address] for contact info under        SignedLeanor Kail, PA  09/19/2021, 9:41 AM

## 2021-09-19 NOTE — Consult Note (Addendum)
Jon Nelson       [place] [address]             [phone]        Jon Nelson [place] Medical Record #[id number] Date of Birth: [birth year]  Referring: No ref. provider found Primary Care: Rochel Brome, MD Primary Cardiologist:Robert Agustin Cree, MD  Chief Complaint:   Right arm pain with rule in for non-STEMI  History of Present Illness:    The patient is a 75 year old male with a history of multiple medical and cardiac comorbidities.  Significant diagnoses include hypertension, hyperlipidemia, diabetes as well as a previous stroke in March 2020.  He presented to the emergency department on 09/17/2021 at [facility] with chief complaint of right arm pain and has subsequently ruled in for non-STEMI.  He was transferred to [facility] on 09/18/2021 for further evaluation to include cardiac catheterization.  He had initial right arm pain symptoms on 09/15/2021 which resolved.  It did recur on the 17th prompting the ER visit.  It was associated with some mild chest pain as well as diaphoresis and nausea/vomiting.  He denies shortness of breath, orthopnea, PND, lower extremity edema, palpitations, lightheadedness, dizziness or syncope.  Initial EKG in the ER at [facility] emergency department showed possible Q waves in leads V1-V3 and T wave inversions in lead aVL.  Troponin I was elevated at 0.88 and proBNP was normal.  Repeat EKG on 1018 showed evolving T wave changes with clear Q waves in V1-V3, and deep T wave inversions in the anterolateral leads.  Repeat troponin was markedly elevated up to 16.3.  He was started on IV heparin.  Echocardiogram reportedly showed segmental wall motion abnormalities in the distribution of the LAD.  He was transferred to [facility] and cardiac catheterization results are listed below.  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  We are asked to see the patient in consideration of CABG for  revascularization.  He does have a history of previous loop recorder with no atrial fibrillation.    Current Activity/ Functional Status: Patient is independent with mobility/ambulation, transfers, ADL's, IADL's.   Zubrod Score: At the time of surgery this patient's most appropriate activity status/level should be described as: []     0    Normal activity, no symptoms [x]     1    Restricted in physical strenuous activity but ambulatory, able to do out light work []     2    Ambulatory and capable of self care, unable to do work activities, up and about                 more than 50%  Of the time                            []     3    Only limited self care, in bed greater than 50% of waking hours []     4    Completely disabled, no self care, confined to bed or chair []     5    Moribund  Past Medical History:  Diagnosis Date   Carotid artery stenosis    Diabetes ([place])    GI bleed    HLD (hyperlipidemia)    HTN (hypertension)    Other amnesia    TIA (transient ischemic attack)     Past Surgical History:  Procedure Laterality Date  HEMORRHOID SURGERY     LEFT HEART CATH AND CORONARY ANGIOGRAPHY N/A 09/18/2021   Procedure: LEFT HEART CATH AND CORONARY ANGIOGRAPHY;  Surgeon: Belva Crome, MD;  Location: [place] [facility];  Service: Cardiovascular;  Laterality: N/A;   LOOP RECORDER INSERTION N/A 02/03/2019   Procedure: LOOP RECORDER INSERTION;  Surgeon: Constance Haw, MD;  Location: [place] [facility];  Service: Cardiovascular;  Laterality: N/A;   TEE WITHOUT CARDIOVERSION N/A 02/03/2019   Procedure: TRANSESOPHAGEAL ECHOCARDIOGRAM (TEE);  Surgeon: Jerline Pain, MD;  Location: [facility] ENDOSCOPY;  Service: Cardiovascular;  Laterality: N/A;  loop    Social History   Tobacco Use  Smoking Status Never  Smokeless Tobacco Never    Social History   Substance and Sexual Activity  Alcohol Use Never     Allergies  Allergen Reactions   Aricept [Donepezil] Nausea Only    Current  Facility-Administered Medications  Medication Dose Route Frequency Provider Last Rate Last Admin   0.9 %  sodium chloride infusion  250 mL Intravenous PRN Belva Crome, MD       acetaminophen (TYLENOL) tablet 650 mg  650 mg Oral Q4H PRN Belva Crome, MD       aspirin EC tablet 81 mg  81 mg Oral Daily Sande Rives E, PA-C   81 mg at 09/19/21 0842   carvedilol (COREG) tablet 3.125 mg  3.125 mg Oral BID WC Bhagat, Bhavinkumar, PA       heparin ADULT infusion 100 units/mL (25000 units/228m)  1,100 Units/hr Intravenous Continuous Carney, JGay Filler RPH 11 mL/hr at 09/19/21 0136 1,100 Units/hr at 09/19/21 0136   insulin aspart (novoLOG) injection 0-15 Units  0-15 Units Subcutaneous TID WC GSande RivesE, PA-C   3 Units at 09/19/21 0842   nitroGLYCERIN (NITROSTAT) SL tablet 0.4 mg  0.4 mg Sublingual Q5 Min x 3 PRN GDarreld Mclean PA-C       nitroGLYCERIN 50 mg in dextrose 5 % 250 mL (0.2 mg/mL) infusion  3 mcg/min Intravenous Titrated SBelva Crome MD   Stopped at 09/18/21 2215   ondansetron (ZOFRAN) injection 4 mg  4 mg Intravenous Q6H PRN GSande RivesE, PA-C       rosuvastatin (CRESTOR) tablet 40 mg  40 mg Oral Daily GSande RivesE, PA-C   40 mg at 09/19/21 0843   sodium chloride flush (NS) 0.9 % injection 3 mL  3 mL Intravenous Q12H SBelva Crome MD   3 mL at 09/18/21 2143   sodium chloride flush (NS) 0.9 % injection 3 mL  3 mL Intravenous PRN SBelva Crome MD        Medications Prior to Admission  Medication Sig Dispense Refill Last Dose   aspirin 325 MG tablet Take 325 mg by mouth daily.   09/18/2021   Cyanocobalamin 1000 MCG/ML KIT Inject 1,000 mcg as directed every 30 (thirty) days.   Past Month   lisinopril (ZESTRIL) 10 MG tablet Take 0.5 tablets (5 mg total) by mouth daily. 90 tablet 2 09/17/2021   metFORMIN (GLUCOPHAGE) 500 MG tablet Take 1 tablet (500 mg total) by mouth every morning. 90 tablet 2 Past Month   rosuvastatin (CRESTOR) 40 MG tablet Take 1 tablet  (40 mg total) by mouth daily. 90 tablet 1 Past Month    Family History  Problem Relation Age of Onset   Stroke Mother      Review of Systems:   Review of Systems  Constitutional:  Positive for diaphoresis. Negative  for chills, fever, malaise/fatigue and weight loss.  HENT:  Positive for hearing loss. Negative for congestion, ear discharge, ear pain, nosebleeds, sinus pain, sore throat and tinnitus.   Eyes:  Positive for blurred vision. Negative for double vision, photophobia, pain, discharge and redness.  Respiratory:  Negative for cough, hemoptysis, sputum production, shortness of breath, wheezing and stridor.   Cardiovascular:  Negative for chest pain, palpitations, orthopnea, claudication, leg swelling and PND.  Gastrointestinal:  Positive for nausea and vomiting. Negative for abdominal pain, blood in stool, constipation, diarrhea, heartburn and melena.  Genitourinary:  Positive for frequency and urgency. Negative for dysuria, flank pain and hematuria.  Musculoskeletal:  Positive for joint pain. Negative for back pain, falls, myalgias and neck pain.  Skin: Negative.   Neurological:  Positive for focal weakness and weakness. Negative for dizziness, tingling, tremors, sensory change, speech change, seizures, loss of consciousness and headaches.       Right arm weakness following CVA in 2020  Endo/Heme/Allergies:  Negative for environmental allergies and polydipsia. Does not bruise/bleed easily.  Psychiatric/Behavioral:  Positive for memory loss. Negative for depression, hallucinations, substance abuse and suicidal ideas. The patient has insomnia. The patient is not nervous/anxious.      Physical Exam: BP 106/74 (BP Location: Left Arm)   Pulse 63   Temp 97.9 F (36.6 C) (Oral)   Resp 16   Ht 5' 9"  (1.753 m)   Wt 85 kg   SpO2 98%   BMI 27.67 kg/m Physical Exam  Constitutional: No distress. He appears acutely ill.  HENT:  Mouth/Throat: Dental caries present. Oropharynx is clear.  Pharynx is normal.  Fair dentition  Eyes: Pupils are equal, round, and reactive to light. Conjunctivae are normal.  Neck: Thyroid normal. No JVD present. No neck adenopathy. No thyromegaly present.  Cardiovascular: Regular rhythm, normal heart sounds, intact distal pulses and normal pulses. Exam reveals no gallop.  No murmur heard. No carotid bruits  Pulmonary/Chest: Breath sounds normal. He has no wheezes. He has no rales. He exhibits no tenderness.  Abdominal: Soft. Bowel sounds are normal. He exhibits no distension and no mass. There is no hepatomegaly. There is no abdominal tenderness.  Musculoskeletal:        General: Normal range of motion.     Cervical back: Normal range of motion and neck supple.  Neurological: He is alert and oriented to person, place, and time. He has normal motor skills.  Skin: Skin is warm and dry. No rash noted. No cyanosis. No jaundice. Nails show no clubbing.       Diagnostic Studies & Laboratory data:    LEFT HEART CATH AND CORONARY ANGIOGRAPHY   Conclusion  CONCLUSIONS: 99% proximal LAD within a heavily calcified segment followed by 80% mid stenosis and 95% apical stenosis.  LAD flow is TIMI grade II.  First and second diagonals have severe obstructive disease. Left main is widely patent. The circumflex is codominant.  30% proximal circumflex, 99% first obtuse marginal, 70 to 80% proximal large second obtuse marginal.  The second obtuse marginal has high-grade distal disease in a trifurcation.  The PDA of the left circumflex contains proximal to mid 90% stenosis. RCA is nondominant and contains segmental 70% stenosis before a large right ventricular branch. Anteroapical wall motion abnormality, EF 40 to 45%, apical dyskinesis, and LVEDP 14 mmHg.   RECOMMENDATIONS:   Surgical anatomy.  TCTS consultation is recommended. RFM.   Recommendations  Antiplatelet/Anticoag Recommend Aspirin 40m daily for moderate CAD.   Surgeon Notes  09/18/2021   5:35 PM CV Procedure signed by Belva Crome, MD    Procedural Details  Technical Details The right radial area was sterilely prepped and draped. Intravenous sedation with Versed and fentanyl was administered. 1% Xylocaine was infiltrated to achieve local analgesia. Using real-time vascular ultrasound, a double wall stick with an angiocath was utilized to obtain intra-arterial access. A VUS image was saved for the record.The modified Seldinger technique was used to place a 29F " Slender" sheath in the right radial artery. Weight based heparin was administered. Coronary angiography was done using 5 F catheters. Right coronary angiography was performed with a JR4. Left ventricular hemodymic recordings and angiography was done using the JR 4 catheter and hand injection. Left coronary angiography was performed with a JL 3.5 cm.  Hemostasis was achieved using a pneumatic band.  During this procedure the patient is administered a total of Versed 1 mg and Fentanyl 25 mcg to achieve and maintain moderate conscious sedation.  The patient's heart rate, blood pressure, and oxygen saturation are monitored continuously during the procedure. The period of conscious sedation is 30 minutes, of which I was present face-to-face 100% of this time.  Estimated blood loss <50 mL.   During this procedure medications were administered to achieve and maintain moderate conscious sedation while the patient's heart rate, blood pressure, and oxygen saturation were continuously monitored and I was present face-to-face 100% of this time.   Medications (Filter: Administrations occurring from 1632 to 1711 on 09/18/21)  important  Continuous medications are totaled by the amount administered until 09/18/21 1711.    Heparin (Porcine) in NaCl 1000-0.9 UT/500ML-% SOLN (mL) Total volume:  1,000 mL  Date/Time Rate/Dose/Volume Action   09/18/21 1640 500 mL Given   1640 500 mL Given    lidocaine (PF) (XYLOCAINE) 1 % injection  (mL) Total volume:  2 mL  Date/Time Rate/Dose/Volume Action   09/18/21 1649 2 mL Given    fentaNYL (SUBLIMAZE) injection (mcg) Total dose:  25 mcg  Date/Time Rate/Dose/Volume Action   09/18/21 1649 25 mcg Given    midazolam (VERSED) injection (mg) Total dose:  1 mg  Date/Time Rate/Dose/Volume Action   09/18/21 1649 1 mg Given    Radial Cocktail/Verapamil only (mL) Total volume:  10 mL  Date/Time Rate/Dose/Volume Action   09/18/21 1650 10 mL Given    heparin sodium (porcine) injection (Units) Total dose:  4,000 Units  Date/Time Rate/Dose/Volume Action   09/18/21 1654 4,000 Units Given    iohexol (OMNIPAQUE) 350 MG/ML injection (mL) Total volume:  65 mL  Date/Time Rate/Dose/Volume Action   09/18/21 1711 65 mL Given    Contrast  Medication Name Total Dose  iohexol (OMNIPAQUE) 350 MG/ML injection 65 mL    Radiation/Fluoro  Fluoro time: 3 (min) DAP: 19205 (mGycm2) Cumulative Air Kerma: 316 (mGy)   Coronary Findings   Diagnostic Dominance: Left  Left Anterior Descending  There is moderate diffuse disease throughout the vessel.  Prox LAD to Mid LAD lesion is 99% stenosed.  Mid LAD lesion is 85% stenosed.  Dist LAD lesion is 95% stenosed.  First Diagonal Branch  Vessel is small in size. The vessel exhibits minimal luminal irregularities.  1st Diag lesion is 99% stenosed.  Left Circumflex  Ost Cx to Prox Cx lesion is 40% stenosed.  First Obtuse Marginal Branch  Vessel is small in size.  1st Mrg lesion is 95% stenosed.  Second Obtuse Marginal Branch  Vessel is large in size.  2nd Mrg-1 lesion is 75%  stenosed.  2nd Mrg-2 lesion is 95% stenosed.  Left Posterior Descending Artery  LPDA lesion is 80% stenosed.  Right Coronary Artery  Vessel is small.  Prox RCA lesion is 75% stenosed.   Intervention   No interventions have been documented.  Wall Motion              Left Heart  Left Ventricle There is mild to moderate left ventricular  systolic dysfunction. LV end diastolic pressure is normal. The left ventricular ejection fraction is 45-50% by visual estimate. There are LV function abnormalities due to segmental dysfunction.   Coronary Diagrams   Diagnostic Dominance: Left    Intervention     I have independently reviewed the above radiologic studies and discussed with the patient   Recent Lab Findings: Lab Results  Component Value Date   WBC 21.6 (H) 09/19/2021   HGB 14.0 09/19/2021   HCT 41.3 09/19/2021   PLT 330 09/19/2021   GLUCOSE 135 (H) 08/28/2021   CHOL 226 (H) 08/28/2021   TRIG 258 (H) 08/28/2021   HDL 28 (L) 08/28/2021   LDLCALC 150 (H) 08/28/2021   ALT 14 08/28/2021   AST 16 08/28/2021   NA 140 08/28/2021   K 4.9 08/28/2021   CL 101 08/28/2021   CREATININE 0.87 08/28/2021   BUN 16 08/28/2021   CO2 22 08/28/2021   INR 1.5 (H) 02/04/2019   HGBA1C 7.3 (H) 08/28/2021      Assessment / Plan:  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  He does appear to be an adequate candidate to proceed with CABG.  Timing of surgery to be determined.    I  spent 40 minutes counseling the patient face to face.   John Giovanni, PA-C  09/19/2021 73:1 AM  75 year old male admitted following an NSTEMI.  He underwent a left heart cath which showed severe three-vessel coronary artery disease.  I personally reviewed his left heart cath and he has good targets for surgical bypass.  His echocardiogram shows mildly reduced LV function with preserved RV function.  There is no significant valvular disease.  Plan for surgical revascularization in early next week.  Shaneque Merkle Bary Leriche

## 2021-09-19 NOTE — Progress Notes (Signed)
Pre-CABG exam has been completed/  Results can be found under chart review under CV PROC. 09/19/2021 1:09 PM Nashaun Hillmer RVT, RDMS

## 2021-09-19 NOTE — Progress Notes (Signed)
2D echocardiogram with Definity completed.  09/19/2021 4:43 PM Kelby Aline., MHA, RVT, RDCS, RDMS

## 2021-09-19 NOTE — Plan of Care (Signed)
Pt expressed feeling tired and wiped out upon admission. Drowsy and deeply sleeping most of this shift. Nitro drip initiated per order but very quick stopped after pressures dropped to 90s/40s with HR 40s-60s Sinus brady/sinus rhythm. Pressures now 90-110s/50-60s. HR continues to bounce 40s-60s sinus brady/rhythm but in the middle of shift started having PVCs followed by 1 second pauses. Heparin drip started per order. Patient continues to rest and states no pain or symptoms.    Problem: Education: Goal: Knowledge of General Education information will improve Description: Including pain rating scale, medication(s)/side effects and non-pharmacologic comfort measures 09/19/2021 0408 by Kayren Eaves, RN Outcome: Progressing 09/19/2021 0120 by Kayren Eaves, RN Outcome: Progressing   Problem: Health Behavior/Discharge Planning: Goal: Ability to manage health-related needs will improve 09/19/2021 0408 by Kayren Eaves, RN Outcome: Progressing 09/19/2021 0120 by Kayren Eaves, RN Outcome: Progressing   Problem: Clinical Measurements: Goal: Ability to maintain clinical measurements within normal limits will improve 09/19/2021 0408 by Kayren Eaves, RN Outcome: Progressing 09/19/2021 0120 by Kayren Eaves, RN Outcome: Progressing Goal: Will remain free from infection 09/19/2021 0408 by Kayren Eaves, RN Outcome: Progressing 09/19/2021 0120 by Kayren Eaves, RN Outcome: Progressing Goal: Diagnostic test results will improve 09/19/2021 0408 by Kayren Eaves, RN Outcome: Progressing 09/19/2021 0120 by Kayren Eaves, RN Outcome: Progressing Goal: Respiratory complications will improve 09/19/2021 0408 by Kayren Eaves, RN Outcome: Progressing 09/19/2021 0120 by Kayren Eaves, RN Outcome: Progressing Goal: Cardiovascular complication will be avoided 09/19/2021 0408 by Kayren Eaves, RN Outcome:  Progressing 09/19/2021 0120 by Kayren Eaves, RN Outcome: Progressing   Problem: Activity: Goal: Risk for activity intolerance will decrease 09/19/2021 0408 by Kayren Eaves, RN Outcome: Progressing 09/19/2021 0120 by Kayren Eaves, RN Outcome: Progressing   Problem: Nutrition: Goal: Adequate nutrition will be maintained 09/19/2021 0408 by Kayren Eaves, RN Outcome: Progressing 09/19/2021 0120 by Kayren Eaves, RN Outcome: Progressing   Problem: Coping: Goal: Level of anxiety will decrease 09/19/2021 0408 by Kayren Eaves, RN Outcome: Progressing 09/19/2021 0120 by Kayren Eaves, RN Outcome: Progressing   Problem: Elimination: Goal: Will not experience complications related to bowel motility 09/19/2021 0408 by Kayren Eaves, RN Outcome: Progressing 09/19/2021 0120 by Kayren Eaves, RN Outcome: Progressing Goal: Will not experience complications related to urinary retention 09/19/2021 0408 by Kayren Eaves, RN Outcome: Progressing 09/19/2021 0120 by Kayren Eaves, RN Outcome: Progressing   Problem: Pain Managment: Goal: General experience of comfort will improve 09/19/2021 0408 by Kayren Eaves, RN Outcome: Progressing 09/19/2021 0120 by Kayren Eaves, RN Outcome: Progressing   Problem: Safety: Goal: Ability to remain free from injury will improve 09/19/2021 0408 by Kayren Eaves, RN Outcome: Progressing 09/19/2021 0120 by Kayren Eaves, RN Outcome: Progressing   Problem: Skin Integrity: Goal: Risk for impaired skin integrity will decrease 09/19/2021 0408 by Kayren Eaves, RN Outcome: Progressing 09/19/2021 0120 by Kayren Eaves, RN Outcome: Progressing

## 2021-09-19 NOTE — Progress Notes (Signed)
ANTICOAGULATION CONSULT NOTE   Pharmacy Consult for IV heparin Indication: CAD awaiting surgical consult  Allergies  Allergen Reactions   Aricept [Donepezil] Nausea Only    Patient Measurements: Height: 5\' 9"  (175.3 cm) Weight: 85 kg (187 lb 6.3 oz) IBW/kg (Calculated) : 70.7 Heparin Dosing Weight: 83 kg  Vital Signs: Temp: 97.7 F (36.5 C) (10/19 2131) Temp Source: Oral (10/19 2131) BP: 98/66 (10/19 2131) Pulse Rate: 64 (10/19 2131)  Labs: Recent Labs    09/19/21 0322 09/19/21 0849 09/19/21 2042  HGB 14.0  --   --   HCT 41.3  --   --   PLT 330  --   --   HEPARINUNFRC  --  0.27* 0.14*     CrCl cannot be calculated (Patient's most recent lab result is older than the maximum 21 days allowed.).   Medical History: Past Medical History:  Diagnosis Date   Carotid artery stenosis    Diabetes ([facility])    GI bleed    HLD (hyperlipidemia)    HTN (hypertension)    Other amnesia    TIA (transient ischemic attack)     Medications:  Infusions:   sodium chloride     heparin 1,200 Units/hr (09/19/21 1440)   nitroGLYCERIN Stopped (09/18/21 2215)    Assessment: 75 yo male admitted with chest pain, now s/p cath lab which revealed multivessel CAD.  Pharmacy asked to dose IV heparin while awaiting consult for OHS.  Hx GIB in the past.   Heparin level remains subtherapeutic.  Goal of Therapy:  Heparin level 0.3-0.7 units/ml Monitor platelets by anticoagulation protocol: Yes   Plan:  -Heparin 1500 units x1 -Increase heparin to 1400 units/h -Repeat heparin level in 8h  Arrie Senate, PharmD, [place], [place] Pharmacist [phone] Please check AMION for all [place] numbers 09/19/2021

## 2021-09-19 NOTE — Progress Notes (Signed)
TCTS consulted for CABG evaluation. °

## 2021-09-19 NOTE — Progress Notes (Signed)
[place] for IV heparin Indication: CAD awaiting surgical consult  Allergies  Allergen Reactions   Aricept [Donepezil] Nausea Only    Patient Measurements: Height: 5\' 9"  (175.3 cm) Weight: 85 kg (187 lb 6.3 oz) IBW/kg (Calculated) : 70.7 Heparin Dosing Weight: 83 kg  Vital Signs: Temp: 97.9 F (36.6 C) (10/19 0735) Temp Source: Oral (10/19 0735) BP: 131/62 (10/19 1142) Pulse Rate: 65 (10/19 1142)  Labs: Recent Labs    09/19/21 0322 09/19/21 0849  HGB 14.0  --   HCT 41.3  --   PLT 330  --   HEPARINUNFRC  --  0.27*    CrCl cannot be calculated (Patient's most recent lab result is older than the maximum 21 days allowed.).   Medical History: Past Medical History:  Diagnosis Date   Carotid artery stenosis    Diabetes ([facility])    GI bleed    HLD (hyperlipidemia)    HTN (hypertension)    Other amnesia    TIA (transient ischemic attack)     Medications:  Infusions:   sodium chloride     heparin 1,100 Units/hr (09/19/21 0136)   nitroGLYCERIN Stopped (09/18/21 2215)    Assessment: 75 yo male admitted with chest pain, now s/p cath lab which revealed multivessel CAD.  Pharmacy asked to dose IV heparin while awaiting consult for OHS.  Hx GIB in the past.   Goal of Therapy:  Heparin level 0.3-0.7 units/ml Monitor platelets by anticoagulation protocol: Yes   Plan:  -Increase heparin to 1200 units/hr -Heparin level in 6 hours and daily wth CBC daily  Hildred Laser, PharmD Clinical Pharmacist **Pharmacist phone directory can now be found on [email or web address] ([email or web address]).  Listed under [place].

## 2021-09-20 ENCOUNTER — Other Ambulatory Visit (HOSPITAL_COMMUNITY): Payer: Self-pay

## 2021-09-20 DIAGNOSIS — I214 Non-ST elevation (NSTEMI) myocardial infarction: Secondary | ICD-10-CM | POA: Diagnosis not present

## 2021-09-20 LAB — CBC
HCT: 41.8 % (ref 39.0–52.0)
Hemoglobin: 14.3 g/dL (ref 13.0–17.0)
MCH: 29.9 pg (ref 26.0–34.0)
MCHC: 34.2 g/dL (ref 30.0–36.0)
MCV: 87.4 fL (ref 80.0–100.0)
Platelets: 315 10*3/uL (ref 150–400)
RBC: 4.78 MIL/uL (ref 4.22–5.81)
RDW: 13.1 % (ref 11.5–15.5)
WBC: 16.6 10*3/uL — ABNORMAL HIGH (ref 4.0–10.5)
nRBC: 0 % (ref 0.0–0.2)

## 2021-09-20 LAB — GLUCOSE, CAPILLARY
Glucose-Capillary: 208 mg/dL — ABNORMAL HIGH (ref 70–99)
Glucose-Capillary: 220 mg/dL — ABNORMAL HIGH (ref 70–99)
Glucose-Capillary: 137 mg/dL — ABNORMAL HIGH (ref 70–99)

## 2021-09-20 LAB — HEPARIN LEVEL (UNFRACTIONATED)
Heparin Unfractionated: 0.29 IU/mL — ABNORMAL LOW (ref 0.30–0.70)
Heparin Unfractionated: 0.36 IU/mL (ref 0.30–0.70)

## 2021-09-20 NOTE — Progress Notes (Signed)
ANTICOAGULATION CONSULT NOTE   Pharmacy Consult for IV heparin Indication: CAD awaiting surgical consult  Allergies  Allergen Reactions   Aricept [Donepezil] Nausea Only    Patient Measurements: Height: 5\' 9"  (175.3 cm) Weight: 85 kg (187 lb 6.3 oz) IBW/kg (Calculated) : 70.7 Heparin Dosing Weight: 83 kg  Vital Signs: Temp: 97.8 F (36.6 C) (10/20 0700) Temp Source: Oral (10/20 0700) BP: 119/80 (10/20 0700) Pulse Rate: 60 (10/20 0833)  Labs: Recent Labs    09/19/21 0322 09/19/21 0849 09/19/21 2042 09/20/21 0708  HGB 14.0  --   --  14.3  HCT 41.3  --   --  41.8  PLT 330  --   --  315  HEPARINUNFRC  --  0.27* 0.14* 0.29*     CrCl cannot be calculated (Patient's most recent lab result is older than the maximum 21 days allowed.).   Medical History: Past Medical History:  Diagnosis Date   Carotid artery stenosis    Diabetes (HCC)    GI bleed    HLD (hyperlipidemia)    HTN (hypertension)    Other amnesia    TIA (transient ischemic attack)     Medications:  Infusions:   sodium chloride     heparin 1,400 Units/hr (09/20/21 7106)   nitroGLYCERIN Stopped (09/18/21 2215)    Assessment: 75 yo male admitted with chest pain, now s/p cath lab which revealed multivessel CAD.  Pharmacy asked to dose IV heparin while awaiting consult for OHS.  Hx GIB in the past.   Heparin level up to 0.29 on 1400 units/hr. CBC stable.   Goal of Therapy:  Heparin level 0.3-0.7 units/ml Monitor platelets by anticoagulation protocol: Yes   Plan:  -Increase heparin to 1500 units/hr -Heparin level in 8 hours and daily wth CBC daily  Hildred Laser, PharmD Clinical Pharmacist **Pharmacist phone directory can now be found on [email or web address] ([email or web address]).  Listed under [place].

## 2021-09-20 NOTE — Plan of Care (Signed)

## 2021-09-20 NOTE — Progress Notes (Signed)
ANTICOAGULATION CONSULT NOTE   Pharmacy Consult for IV heparin Indication: CAD awaiting surgical consult  Allergies  Allergen Reactions   Aricept [Donepezil] Nausea Only    Patient Measurements: Height: 5\' 9"  (175.3 cm) Weight: 85 kg (187 lb 6.3 oz) IBW/kg (Calculated) : 70.7 Heparin Dosing Weight: 83 kg  Vital Signs: Temp: 97.7 F (36.5 C) (10/20 1435) Temp Source: Oral (10/20 1435) BP: 107/51 (10/20 1632) Pulse Rate: 71 (10/20 1632)  Labs: Recent Labs    09/19/21 0322 09/19/21 0849 09/19/21 2042 09/20/21 0708 09/20/21 1735  HGB 14.0  --   --  14.3  --   HCT 41.3  --   --  41.8  --   PLT 330  --   --  315  --   HEPARINUNFRC  --    < > 0.14* 0.29* 0.36   < > = values in this interval not displayed.     CrCl cannot be calculated (Patient's most recent lab result is older than the maximum 21 days allowed.).   Medical History: Past Medical History:  Diagnosis Date   Carotid artery stenosis    Diabetes (HCC)    GI bleed    HLD (hyperlipidemia)    HTN (hypertension)    Other amnesia    TIA (transient ischemic attack)     Medications:  Infusions:   sodium chloride     heparin 1,500 Units/hr (09/20/21 0946)   nitroGLYCERIN Stopped (09/18/21 2215)    Assessment: 75 yo male admitted with chest pain, now s/p cath lab which revealed multivessel CAD.  Pharmacy asked to dose IV heparin while awaiting consult for OHS.  Hx GIB in the past.   Heparin level therapeutic at 0.36.  Goal of Therapy:  Heparin level 0.3-0.7 units/ml Monitor platelets by anticoagulation protocol: Yes   Plan:  -Continue heparin 1500 units/h -Daily heparin level and CBC  Arrie Senate, PharmD, BCPS, [place] Clinical Pharmacist [phone] Please check AMION for all [facility] Pharmacy numbers 09/20/2021

## 2021-09-20 NOTE — Progress Notes (Signed)
Progress Note  Patient Name: Jon Nelson Date of Encounter: 09/20/2021  [facility] Cardiologist: Jenne Campus, MD   Subjective   Feeling well. No chest pain, sob or palpitations.   CVTS PA has seen no date for surgery yet   Inpatient Medications    Scheduled Meds:  aspirin EC  81 mg Oral Daily   carvedilol  3.125 mg Oral BID WC   insulin aspart  0-15 Units Subcutaneous TID WC   rosuvastatin  40 mg Oral Daily   sodium chloride flush  3 mL Intravenous Q12H   Continuous Infusions:  sodium chloride     heparin 1,400 Units/hr (09/20/21 0838)   nitroGLYCERIN Stopped (09/18/21 2215)   PRN Meds: sodium chloride, acetaminophen, nitroGLYCERIN, ondansetron (ZOFRAN) IV, sodium chloride flush   Vital Signs    Vitals:   09/19/21 2131 09/20/21 0530 09/20/21 0700 09/20/21 0833  BP: 98/66 115/65 119/80   Pulse: 64 60 62 60  Resp: 18 19 20    Temp: 97.7 F (36.5 C) 97.7 F (36.5 C) 97.8 F (36.6 C)   TempSrc: Oral Oral Oral   SpO2: 98% 98% 98%   Weight:      Height:   5\' 9"  (1.753 m)     Intake/Output Summary (Last 24 hours) at 09/20/2021 0903 Last data filed at 09/20/2021 0533 Gross per 24 hour  Intake 230.09 ml  Output 2100 ml  Net -1869.91 ml   Last 3 Weights 09/18/2021 08/28/2021 08/15/2021  Weight (lbs) 187 lb 6.3 oz 183 lb 11.2 oz 184 lb 6.4 oz  Weight (kg) 85 kg 83.326 kg 83.643 kg      Telemetry    NSR - Personally Reviewed 09/20/2021   ECG    Sinus bradycardia, TWI in anterior lateral leads - Personally Reviewed  Physical Exam   GEN: No acute distress.   Neck: No JVD Cardiac: RRR, no murmurs, rubs, or gallops. R radial cath site without hematoma  Respiratory: Clear to auscultation bilaterally. GI: Soft, nontender, non-distended  MS: No edema; No deformity. Neuro:  Nonfocal  Psych: Normal affect   Labs     Hematology Recent Labs  Lab 09/19/21 0322 09/20/21 0708  WBC 21.6* 16.6*  RBC 4.62 4.78  HGB 14.0 14.3  HCT 41.3 41.8  MCV  89.4 87.4  MCH 30.3 29.9  MCHC 33.9 34.2  RDW 13.3 13.1  PLT 330 315    Radiology    CARDIAC CATHETERIZATION  Result Date: 09/18/2021 CONCLUSIONS: 99% proximal LAD within a heavily calcified segment followed by 80% mid stenosis and 95% apical stenosis.  LAD flow is TIMI grade II.  First and second diagonals have severe obstructive disease. Left main is widely patent. The circumflex is codominant.  30% proximal circumflex, 99% first obtuse marginal, 70 to 80% proximal large second obtuse marginal.  The second obtuse marginal has high-grade distal disease in a trifurcation.  The PDA of the left circumflex contains proximal to mid 90% stenosis. RCA is nondominant and contains segmental 70% stenosis before a large right ventricular branch. Anteroapical wall motion abnormality, EF 40 to 45%, apical dyskinesis, and LVEDP 14 mmHg. RECOMMENDATIONS: Surgical anatomy.  TCTS consultation is recommended. [facility]M.   ECHOCARDIOGRAM COMPLETE  Result Date: 09/19/2021    ECHOCARDIOGRAM REPORT   Patient Name:   Jon Nelson Date of Exam: 09/19/2021 Medical Rec #:  [id number]    Height:       69.0 in Accession #:    [id number]   Weight:  187.4 lb Date of Birth:  1946/08/13    BSA:          2.010 m Patient Age:    75 years     BP:           108/59 mmHg Patient Gender: M            HR:           62 bpm. Exam Location:  Inpatient Procedure: 2D Echo, Color Doppler, Cardiac Doppler and Intracardiac            Opacification Agent Indications:    Acute ischemic heart disease  History:        Patient has prior history of Echocardiogram examinations, most                 recent 01/31/2019. TIA; Risk Factors:Diabetes, Dyslipidemia and                 Hypertension.  Sonographer:    Maudry Mayhew MHA, RDMS, RVT, RDCS Referring Phys: [place]  Sonographer Comments: Suboptimal apical window. IMPRESSIONS  1. No left ventricular thrombus is seen with Definity contrast. Left ventricular ejection fraction, by estimation,  is 45 to 50%. The left ventricle has mildly decreased function. The left ventricle demonstrates regional wall motion abnormalities (see scoring diagram/findings for description). Left ventricular diastolic parameters were normal. There is moderate hypokinesis of the left ventricular, apical septal wall, inferior wall, apical segment and anterior wall.  2. Right ventricular systolic function is normal. The right ventricular size is normal.  3. Left atrial size was mildly dilated.  4. The mitral valve is normal in structure. No evidence of mitral valve regurgitation. No evidence of mitral stenosis.  5. The aortic valve is normal in structure. There is mild calcification of the aortic valve. Aortic valve regurgitation is not visualized. Mild aortic valve sclerosis is present, with no evidence of aortic valve stenosis.  6. The inferior vena cava is normal in size with greater than 50% respiratory variability, suggesting right atrial pressure of 3 mmHg. Comparison(s): Prior images reviewed side by side. The left ventricular function is worsened. The left ventricular wall motion abnormality is new. FINDINGS  Left Ventricle: No left ventricular thrombus is seen with Definity contrast. Left ventricular ejection fraction, by estimation, is 45 to 50%. The left ventricle has mildly decreased function. The left ventricle demonstrates regional wall motion abnormalities. Moderate hypokinesis of the left ventricular, apical septal wall, inferior wall, apical segment and anterior wall. The left ventricular internal cavity size was normal in size. There is no left ventricular hypertrophy. Left ventricular diastolic parameters were normal.  LV Wall Scoring: The apical septal segment, apical anterior segment, apical inferior segment, and apex are hypokinetic. The anterior wall, entire lateral wall, anterior septum, inferior wall, mid inferoseptal segment, and basal inferoseptal segment are normal. Right Ventricle: The right  ventricular size is normal. No increase in right ventricular wall thickness. Right ventricular systolic function is normal. Left Atrium: Left atrial size was mildly dilated. Right Atrium: Right atrial size was normal in size. Pericardium: There is no evidence of pericardial effusion. Mitral Valve: The mitral valve is normal in structure. No evidence of mitral valve regurgitation. No evidence of mitral valve stenosis. Tricuspid Valve: The tricuspid valve is normal in structure. Tricuspid valve regurgitation is not demonstrated. No evidence of tricuspid stenosis. Aortic Valve: The aortic valve is normal in structure. There is mild calcification of the aortic valve. Aortic valve regurgitation is not visualized.  Mild aortic valve sclerosis is present, with no evidence of aortic valve stenosis. Aortic valve mean gradient measures 4.0 mmHg. Aortic valve peak gradient measures 8.8 mmHg. Aortic valve area, by VTI measures 1.80 cm. Pulmonic Valve: The pulmonic valve was normal in structure. Pulmonic valve regurgitation is not visualized. No evidence of pulmonic stenosis. Aorta: The aortic root is normal in size and structure. Venous: The inferior vena cava is normal in size with greater than 50% respiratory variability, suggesting right atrial pressure of 3 mmHg. IAS/Shunts: No atrial level shunt detected by color flow Doppler.  LEFT VENTRICLE PLAX 2D LVIDd:         5.30 cm   Diastology LVIDs:         4.10 cm   LV e' medial:    8.49 cm/s LV PW:         0.80 cm   LV E/e' medial:  10.8 LV IVS:        0.70 cm   LV e' lateral:   8.92 cm/s LVOT diam:     1.80 cm   LV E/e' lateral: 10.2 LV SV:         50 LV SV Index:   25 LVOT Area:     2.54 cm  RIGHT VENTRICLE RV S prime:     7.07 cm/s TAPSE (M-mode): 2.8 cm LEFT ATRIUM             Index        RIGHT ATRIUM           Index LA diam:        3.80 cm 1.89 cm/m   RA Area:     15.10 cm LA Vol (A2C):   45.2 ml 22.49 ml/m  RA Volume:   42.40 ml  21.10 ml/m LA Vol (A4C):   70.7 ml  35.18 ml/m LA Biplane Vol: 61.7 ml 30.70 ml/m  AORTIC VALVE AV Area (Vmax):    1.66 cm AV Area (Vmean):   1.66 cm AV Area (VTI):     1.80 cm AV Vmax:           148.00 cm/s AV Vmean:          95.000 cm/s AV VTI:            0.277 m AV Peak Grad:      8.8 mmHg AV Mean Grad:      4.0 mmHg LVOT Vmax:         96.40 cm/s LVOT Vmean:        62.000 cm/s LVOT VTI:          0.196 m LVOT/AV VTI ratio: 0.71  AORTA Ao Root diam: 3.10 cm MITRAL VALVE MV Area (PHT): 4.17 cm    SHUNTS MV Decel Time: 182 msec    Systemic VTI:  0.20 m MV E velocity: 91.30 cm/s  Systemic Diam: 1.80 cm MV A velocity: 99.40 cm/s MV E/A ratio:  0.92 Mihai Croitoru MD Electronically signed by Sanda Klein MD Signature Date/Time: 09/19/2021/6:13:37 PM    Final    VAS US DOPPLER PRE CABG  Result Date: 09/19/2021 PREOPERATIVE VASCULAR EVALUATION Patient Name:  Jon Nelson  Date of Exam:   09/19/2021 Medical Rec #: [id number]     Accession #:    [id number] Date of Birth: Jul 28, 1946     Patient Gender: M Patient Age:   89 years Exam Location:  [facility] Procedure:      VAS US DOPPLER PRE CABG Referring Phys: Collier Salina  Nigel Ericsson --------------------------------------------------------------------------------  Indications:      Pre-CABG. Risk Factors:     Hypertension, hyperlipidemia, Diabetes, no history of smoking,                   prior MI, coronary artery disease, prior CVA. Other Factors:    Carotid artery stenosis. Comparison Study: Previous carotid duplex on 02/01/2019 - LT ICA 40-59% Performing Technologist: Rogelia Rohrer RVT, RDMS  Examination Guidelines: A complete evaluation includes B-mode imaging, spectral Doppler, color Doppler, and power Doppler as needed of all accessible portions of each vessel. Bilateral testing is considered an integral part of a complete examination. Limited examinations for reoccurring indications may be performed as noted.  Right Carotid Findings:  +----------+-------+--------+--------+-----------------------+-----------------+           PSV    EDV cm/sStenosisDescribe               Comments                    cm/s                                                            +----------+-------+--------+--------+-----------------------+-----------------+ CCA Prox  76     10                                     intimal                                                                   thickening        +----------+-------+--------+--------+-----------------------+-----------------+ CCA Distal76     12                                     intimal                                                                   thickening        +----------+-------+--------+--------+-----------------------+-----------------+ ICA Prox  79     18              calcific and                                                              heterogenous                             +----------+-------+--------+--------+-----------------------+-----------------+ ICA Distal59     12                                                       +----------+-------+--------+--------+-----------------------+-----------------+  ECA       169                                                             +----------+-------+--------+--------+-----------------------+-----------------+ +----------+--------+-------+--------+------------+           PSV cm/sEDV cmsDescribeArm Pressure +----------+--------+-------+--------+------------+ Subclavian73                                  +----------+--------+-------+--------+------------+ +---------+--------+--+--------+--+ VertebralPSV cm/s42EDV cm/s10 +---------+--------+--+--------+--+ Left Carotid Findings: +----------+-------+--------+--------+-----------------------+-----------------+           PSV    EDV cm/sStenosisDescribe               Comments                    cm/s                                                             +----------+-------+--------+--------+-----------------------+-----------------+ CCA Prox  73     12                                                       +----------+-------+--------+--------+-----------------------+-----------------+ CCA Distal82     14                                     intimal                                                                   thickening        +----------+-------+--------+--------+-----------------------+-----------------+ ICA Prox  124    24      40-59%  calcific and                                                              heterogenous                             +----------+-------+--------+--------+-----------------------+-----------------+ ICA Mid   128    22      40-59%  hypoechoic                               +----------+-------+--------+--------+-----------------------+-----------------+ ICA Distal82     15                                                       +----------+-------+--------+--------+-----------------------+-----------------+  ECA       159    0                                                        +----------+-------+--------+--------+-----------------------+-----------------+ +----------+--------+--------+--------+------------+ SubclavianPSV cm/sEDV cm/sDescribeArm Pressure +----------+--------+--------+--------+------------+           131                                  +----------+--------+--------+--------+------------+ +---------+--------+--+--------+--+ VertebralPSV cm/s46EDV cm/s10 +---------+--------+--+--------+--+  ABI Findings: +---------+------------------+-----+----------+--------+ Right    Rt Pressure (mmHg)IndexWaveform  Comment  +---------+------------------+-----+----------+--------+ Brachial 130                    triphasic          +---------+------------------+-----+----------+--------+  PTA      121               0.88 monophasic         +---------+------------------+-----+----------+--------+ DP       156               1.14 triphasic          +---------+------------------+-----+----------+--------+ Great Toe                       Normal             +---------+------------------+-----+----------+--------+ +---------+------------------+-----+---------+-------+ Left     Lt Pressure (mmHg)IndexWaveform Comment +---------+------------------+-----+---------+-------+ Brachial 137                    triphasic        +---------+------------------+-----+---------+-------+ PTA      123               0.90 biphasic         +---------+------------------+-----+---------+-------+ DP       120               0.88 biphasic         +---------+------------------+-----+---------+-------+ Great Toe                       Normal           +---------+------------------+-----+---------+-------+ +-------+---------------+----------------+ ABI/TBIToday's ABI/TBIPrevious ABI/TBI +-------+---------------+----------------+ Right  1.14 / 0.83                     +-------+---------------+----------------+ Left   0.90 / 0.69                     +-------+---------------+----------------+  Right Doppler Findings: +--------+--------+-----+---------+-----------------------------+ Site    PressureIndexDoppler  Comments                      +--------+--------+-----+---------+-----------------------------+ Brachial130          triphasic                              +--------+--------+-----+---------+-----------------------------+ Radial               biphasic RT radial heart cath 09/18/21 +--------+--------+-----+---------+-----------------------------+ Ulnar                triphasic                              +--------+--------+-----+---------+-----------------------------+  Left Doppler Findings: +--------+--------+-----+---------+--------+ Site     PressureIndexDoppler  Comments +--------+--------+-----+---------+--------+ [id number]          triphasic         +--------+--------+-----+---------+--------+ Radial               triphasic         +--------+--------+-----+---------+--------+ Ulnar                triphasic         +--------+--------+-----+---------+--------+  Summary: Right Carotid: The extracranial vessels were near-normal with only minimal wall                thickening or plaque. Left Carotid: Velocities in the left ICA are consistent with a 40-59% stenosis.               The CCA nad ECA were near-normal with only minimal wall thickening               or plaque. Vertebrals:  Bilateral vertebral arteries demonstrate antegrade flow. Subclavians: Normal flow hemodynamics were seen in bilateral subclavian              arteries. Right ABI: Resting right ankle-brachial index is within normal range. No evidence of significant right lower extremity arterial disease. The right toe-brachial index is normal. 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 The left toe-brachial index is normal. Right Upper Extremity: Doppler waveform obliterate with right radial compression. Doppler waveforms remain within normal limits with right ulnar compression. Left Upper Extremity: Doppler waveforms remain within normal limits with left radial compression. Doppler waveforms remain within normal limits with left ulnar compression.  Electronically signed by Orlie Pollen on 09/19/2021 at 7:52:00 PM.    Final     Cardiac Studies   Pending echo    LEFT HEART CATH AND CORONARY ANGIOGRAPHY   Conclusion  CONCLUSIONS: 99% proximal LAD within a heavily calcified segment followed by 80% mid stenosis and 95% apical stenosis.  LAD flow is TIMI grade II.  First and second diagonals have severe obstructive disease. Left main is widely patent. The circumflex is codominant.  30% proximal  circumflex, 99% first obtuse marginal, 70 to 80% proximal large second obtuse marginal.  The second obtuse marginal has high-grade distal disease in a trifurcation.  The PDA of the left circumflex contains proximal to mid 90% stenosis. RCA is nondominant and contains segmental 70% stenosis before a large right ventricular branch. Anteroapical wall motion abnormality, EF 40 to 45%, apical dyskinesis, and LVEDP 14 mmHg.   RECOMMENDATIONS:   Surgical anatomy.  TCTS consultation is recommended. RFM.  Diagnostic Dominance: Left   Patient Profile     75 y.o. male with a history of hypertension, hyperlipidemia, diabetes, and prior stroke in 01/2019 s/p loop recorder which showed no evidence of atrial fibrillation who presented to [facility] ED on 09/17/2021 with right arm pain and ruled in for non-STEMI.  He was transferred to [facility] on 09/18/2021 for further evaluation with cardiac catheterization.  Assessment & Plan    NSTEMI/CAD - Troponin I elevated at 0.88 >> 14.00 >> 16.30 Outside hospital. EKG shows Q waves in leads V1-V3 and involving T wave changes in anterior lateral leads. Cardiac cath showed multivessel CAD. Pending CTCS evaluation.  - Continue ASA and statin. - Continue heparin  -Coreg low dose with some bradycardia.  - Pre CABG dopplers  with 40-59% left ICA stenosis Left ABI only mildly decreased 0.9   2.  Ischemic cardiomyopathy - Echo at [facility] reportedly showed diminished LVEF with segmental wall motion abnormality indicating problem with known proximal LAD (unable to personally see report). - TTE with EF 45-50%  - BB as summarized above - Home lisinopril on hold,likely start post op - Euvolemic with good diuresis   3. HTN - BP currently soft - As above   4. HLD - 08/28/2021: Cholesterol, Total 226; HDL 28; LDL Chol Calc (NIH) 150; Triglycerides 258  - Continue high intensity statin  - Consider OP f/u labs 6-8 weeks given statin initiation this admission.    5. DM - HgbA1c 7.3 - Metformin on hold - SSI while here  For questions or updates, please contact [place] Please consult [email or web address] for contact info under        Signed, Jenkins Rouge, MD  09/20/2021, 9:03 AM

## 2021-09-20 NOTE — TOC Benefit Eligibility Note (Signed)
Patient Teacher, English as a foreign language completed.    The patient is currently admitted and upon discharge could be taking Jardiance 10 mg.  The current 30 day co-pay is, $47.00.   The patient is currently admitted and upon discharge could be taking Farxiga 10 mg.  The current 30 day co-pay is, $47.00.   The patient is insured through [place], [place] Patient Advocate Specialist [place] Team Direct Number: [phone]  Fax: [phone]

## 2021-09-21 DIAGNOSIS — I214 Non-ST elevation (NSTEMI) myocardial infarction: Secondary | ICD-10-CM | POA: Diagnosis not present

## 2021-09-21 LAB — HEPARIN LEVEL (UNFRACTIONATED): Heparin Unfractionated: 0.38 IU/mL (ref 0.30–0.70)

## 2021-09-21 LAB — CBC
HCT: 42.7 % (ref 39.0–52.0)
Hemoglobin: 14.7 g/dL (ref 13.0–17.0)
MCH: 30.1 pg (ref 26.0–34.0)
MCHC: 34.4 g/dL (ref 30.0–36.0)
MCV: 87.5 fL (ref 80.0–100.0)
Platelets: 311 10*3/uL (ref 150–400)
RBC: 4.88 MIL/uL (ref 4.22–5.81)
RDW: 13.1 % (ref 11.5–15.5)
WBC: 17.4 10*3/uL — ABNORMAL HIGH (ref 4.0–10.5)
nRBC: 0 % (ref 0.0–0.2)

## 2021-09-21 LAB — GLUCOSE, CAPILLARY
Glucose-Capillary: 221 mg/dL — ABNORMAL HIGH (ref 70–99)
Glucose-Capillary: 191 mg/dL — ABNORMAL HIGH (ref 70–99)
Glucose-Capillary: 203 mg/dL — ABNORMAL HIGH (ref 70–99)
Glucose-Capillary: 256 mg/dL — ABNORMAL HIGH (ref 70–99)
Glucose-Capillary: 287 mg/dL — ABNORMAL HIGH (ref 70–99)

## 2021-09-21 NOTE — Care Management Important Message (Signed)
Important Message  Patient Details  Name: Tryston Gilliam MRN: [id number] Date of Birth: 01/09/1946   Medicare Important Message Given:  Yes     Shelda Altes 09/21/2021, 10:56 AM

## 2021-09-21 NOTE — Progress Notes (Signed)
   09/21/21 0806  Clinical Encounter Type  Visited With Other (Comment) (Patient's sister-in-law, [name] Cox called)  Visit Type Initial  Referral From Family  Consult/Referral To Chaplain   Family called, identifying herself as the patient's sister-in-law, [name] Cox. She said she is the patient's POA. She said the patient is on 2H and he is requesting an Advance Directive because he is having open heart surgery next Tuesday. Thanked her for calling, to provide update. Chaplain did not confirm if the patient is here. Chaplain will request for the AM Chaplain to visit the patient and provide A.D. education.  This note was prepared by Jeanine Luz, M.Div..  For questions please contact by phone [phone].

## 2021-09-21 NOTE — Progress Notes (Signed)
CARDIAC REHAB PHASE I   PRE:  Rate/Rhythm: 77 SR    BP: sitting 116/73    SaO2:   MODE:  Ambulation: 420 ft   POST:  Rate/Rhythm: 86 SR    BP: sitting 130/86     SaO2: 98 RA  Ambulated with standby assist. C/o wobbliness, seemed to improve with distance. No other c/o, VSS, to recliner. Discussed IS (max 2000 ml), sternal precautions, mobility post op and d/c planning. Pt receptive but sts he doesn't like to know too many details. Declined preop video. He was emotional briefly and misses his Nat Christen. His wife passed 6 years ago and his sis-in-law is his support. He thinks he will have her or others with him at d/c. Encouraged him to discuss this with her.  [place], ACSM 09/21/2021 9:33 AM

## 2021-09-21 NOTE — Progress Notes (Signed)
ANTICOAGULATION CONSULT NOTE   Pharmacy Consult for IV heparin Indication: CAD awaiting surgical consult  Allergies  Allergen Reactions   Aricept [Donepezil] Nausea Only    Patient Measurements: Height: 5\' 9"  (175.3 cm) Weight: 85 kg (187 lb 6.3 oz) IBW/kg (Calculated) : 70.7 Heparin Dosing Weight: 83 kg  Vital Signs: Temp: 97.9 F (36.6 C) (10/21 0434) Temp Source: Oral (10/21 0434) BP: 122/74 (10/21 0434) Pulse Rate: 73 (10/21 0434)  Labs: Recent Labs    09/19/21 0322 09/19/21 0849 09/20/21 0708 09/20/21 1735 09/21/21 0127  HGB 14.0  --  14.3  --  14.7  HCT 41.3  --  41.8  --  42.7  PLT 330  --  315  --  311  HEPARINUNFRC  --    < > 0.29* 0.36 0.38   < > = values in this interval not displayed.     CrCl cannot be calculated (Patient's most recent lab result is older than the maximum 21 days allowed.).   Medical History: Past Medical History:  Diagnosis Date   Carotid artery stenosis    Diabetes (HCC)    GI bleed    HLD (hyperlipidemia)    HTN (hypertension)    Other amnesia    TIA (transient ischemic attack)     Medications:  Infusions:   sodium chloride     heparin 1,500 Units/hr (09/21/21 0254)   nitroGLYCERIN Stopped (09/18/21 2215)    Assessment: 75 yo male admitted with chest pain, now s/p cath lab which revealed multivessel CAD.  Pharmacy asked to dose IV heparin while awaiting consult for OHS.  Hx GIB in the past. Plans noted for CABG on 10/25  Heparin level up to 0.29 on 1400 units/hr. CBC stable.   Goal of Therapy:  Heparin level 0.3-0.7 units/ml Monitor platelets by anticoagulation protocol: Yes   Plan:  -Continue heparin at 1500 units/hr -Heparin level daily wth CBC daily  Hildred Laser, PharmD Clinical Pharmacist **Pharmacist phone directory can now be found on [email or web address] ([email or web address]).  Listed under [facility].

## 2021-09-21 NOTE — Progress Notes (Signed)
     [address]       [place] [address]             [phone]       No events. We will plan for CABG on 09/24/2021.  Jon Nelson

## 2021-09-21 NOTE — Progress Notes (Signed)
Progress Note  Patient Name: Jon Nelson Date of Encounter: 09/21/2021  [facility] Cardiologist: Jenne Campus, MD   Subjective   No chest pain Apparently seen by Dr Kipp Brood this am Surgery Tuesday next week  Inpatient Medications    Scheduled Meds:  aspirin EC  81 mg Oral Daily   carvedilol  3.125 mg Oral BID WC   insulin aspart  0-15 Units Subcutaneous TID WC   rosuvastatin  40 mg Oral Daily   sodium chloride flush  3 mL Intravenous Q12H   Continuous Infusions:  sodium chloride     heparin 1,500 Units/hr (09/21/21 0254)   nitroGLYCERIN Stopped (09/18/21 2215)   PRN Meds: sodium chloride, acetaminophen, nitroGLYCERIN, ondansetron (ZOFRAN) IV, sodium chloride flush   Vital Signs    Vitals:   09/20/21 1632 09/20/21 2032 09/21/21 0434 09/21/21 0838  BP: (!) 107/51 119/73 122/74 116/73  Pulse: 71 77 73 64  Resp:   17   Temp:  97.6 F (36.4 C) 97.9 F (36.6 C)   TempSrc:  Oral Oral   SpO2: 99% 95% 99% 97%  Weight:      Height:        Intake/Output Summary (Last 24 hours) at 09/21/2021 0916 Last data filed at 09/21/2021 0830 Gross per 24 hour  Intake 540 ml  Output 2100 ml  Net -1560 ml   Last 3 Weights 09/20/2021 09/18/2021 08/28/2021  Weight (lbs) 187 lb 6.3 oz 187 lb 6.3 oz 183 lb 11.2 oz  Weight (kg) 85 kg 85 kg 83.326 kg      Telemetry    NSR - Personally Reviewed 09/21/2021   ECG    Sinus bradycardia, TWI in anterior lateral leads - Personally Reviewed  Physical Exam   GEN: No acute distress.   Neck: No JVD Cardiac: RRR, no murmurs, rubs, or gallops. R radial cath site without hematoma  Respiratory: Clear to auscultation bilaterally. GI: Soft, nontender, non-distended  MS: No edema; No deformity. Neuro:  Nonfocal  Psych: Normal affect   Labs     Hematology Recent Labs  Lab 09/19/21 0322 09/20/21 0708 09/21/21 0127  WBC 21.6* 16.6* 17.4*  RBC 4.62 4.78 4.88  HGB 14.0 14.3 14.7  HCT 41.3 41.8 42.7  MCV 89.4 87.4 87.5   MCH 30.3 29.9 30.1  MCHC 33.9 34.2 34.4  RDW 13.3 13.1 13.1  PLT 330 315 311    Radiology    ECHOCARDIOGRAM COMPLETE  Result Date: 09/19/2021    ECHOCARDIOGRAM REPORT   Patient Name:   Jon Nelson Date of Exam: 09/19/2021 Medical Rec #:  [id number]    Height:       69.0 in Accession #:    [id number]   Weight:       187.4 lb Date of Birth:  1946/05/10    BSA:          2.010 m Patient Age:    75 years     BP:           108/59 mmHg Patient Gender: M            HR:           62 bpm. Exam Location:  Inpatient Procedure: 2D Echo, Color Doppler, Cardiac Doppler and Intracardiac            Opacification Agent Indications:    Acute ischemic heart disease  History:        Patient has prior history of Echocardiogram examinations, most  recent 01/31/2019. TIA; Risk Factors:Diabetes, Dyslipidemia and                 Hypertension.  Sonographer:    Maudry Mayhew MHA, RDMS, RVT, RDCS Referring Phys: [place]  Sonographer Comments: Suboptimal apical window. IMPRESSIONS  1. No left ventricular thrombus is seen with Definity contrast. Left ventricular ejection fraction, by estimation, is 45 to 50%. The left ventricle has mildly decreased function. The left ventricle demonstrates regional wall motion abnormalities (see scoring diagram/findings for description). Left ventricular diastolic parameters were normal. There is moderate hypokinesis of the left ventricular, apical septal wall, inferior wall, apical segment and anterior wall.  2. Right ventricular systolic function is normal. The right ventricular size is normal.  3. Left atrial size was mildly dilated.  4. The mitral valve is normal in structure. No evidence of mitral valve regurgitation. No evidence of mitral stenosis.  5. The aortic valve is normal in structure. There is mild calcification of the aortic valve. Aortic valve regurgitation is not visualized. Mild aortic valve sclerosis is present, with no evidence of aortic valve  stenosis.  6. The inferior vena cava is normal in size with greater than 50% respiratory variability, suggesting right atrial pressure of 3 mmHg. Comparison(s): Prior images reviewed side by side. The left ventricular function is worsened. The left ventricular wall motion abnormality is new. FINDINGS  Left Ventricle: No left ventricular thrombus is seen with Definity contrast. Left ventricular ejection fraction, by estimation, is 45 to 50%. The left ventricle has mildly decreased function. The left ventricle demonstrates regional wall motion abnormalities. Moderate hypokinesis of the left ventricular, apical septal wall, inferior wall, apical segment and anterior wall. The left ventricular internal cavity size was normal in size. There is no left ventricular hypertrophy. Left ventricular diastolic parameters were normal.  LV Wall Scoring: The apical septal segment, apical anterior segment, apical inferior segment, and apex are hypokinetic. The anterior wall, entire lateral wall, anterior septum, inferior wall, mid inferoseptal segment, and basal inferoseptal segment are normal. Right Ventricle: The right ventricular size is normal. No increase in right ventricular wall thickness. Right ventricular systolic function is normal. Left Atrium: Left atrial size was mildly dilated. Right Atrium: Right atrial size was normal in size. Pericardium: There is no evidence of pericardial effusion. Mitral Valve: The mitral valve is normal in structure. No evidence of mitral valve regurgitation. No evidence of mitral valve stenosis. Tricuspid Valve: The tricuspid valve is normal in structure. Tricuspid valve regurgitation is not demonstrated. No evidence of tricuspid stenosis. Aortic Valve: The aortic valve is normal in structure. There is mild calcification of the aortic valve. Aortic valve regurgitation is not visualized. Mild aortic valve sclerosis is present, with no evidence of aortic valve stenosis. Aortic valve mean gradient  measures 4.0 mmHg. Aortic valve peak gradient measures 8.8 mmHg. Aortic valve area, by VTI measures 1.80 cm. Pulmonic Valve: The pulmonic valve was normal in structure. Pulmonic valve regurgitation is not visualized. No evidence of pulmonic stenosis. Aorta: The aortic root is normal in size and structure. Venous: The inferior vena cava is normal in size with greater than 50% respiratory variability, suggesting right atrial pressure of 3 mmHg. IAS/Shunts: No atrial level shunt detected by color flow Doppler.  LEFT VENTRICLE PLAX 2D LVIDd:         5.30 cm   Diastology LVIDs:         4.10 cm   LV e' medial:    8.49 cm/s LV PW:  0.80 cm   LV E/e' medial:  10.8 LV IVS:        0.70 cm   LV e' lateral:   8.92 cm/s LVOT diam:     1.80 cm   LV E/e' lateral: 10.2 LV SV:         50 LV SV Index:   25 LVOT Area:     2.54 cm  RIGHT VENTRICLE RV S prime:     7.07 cm/s TAPSE (M-mode): 2.8 cm LEFT ATRIUM             Index        RIGHT ATRIUM           Index LA diam:        3.80 cm 1.89 cm/m   RA Area:     15.10 cm LA Vol (A2C):   45.2 ml 22.49 ml/m  RA Volume:   42.40 ml  21.10 ml/m LA Vol (A4C):   70.7 ml 35.18 ml/m LA Biplane Vol: 61.7 ml 30.70 ml/m  AORTIC VALVE AV Area (Vmax):    1.66 cm AV Area (Vmean):   1.66 cm AV Area (VTI):     1.80 cm AV Vmax:           148.00 cm/s AV Vmean:          95.000 cm/s AV VTI:            0.277 m AV Peak Grad:      8.8 mmHg AV Mean Grad:      4.0 mmHg LVOT Vmax:         96.40 cm/s LVOT Vmean:        62.000 cm/s LVOT VTI:          0.196 m LVOT/AV VTI ratio: 0.71  AORTA Ao Root diam: 3.10 cm MITRAL VALVE MV Area (PHT): 4.17 cm    SHUNTS MV Decel Time: 182 msec    Systemic VTI:  0.20 m MV E velocity: 91.30 cm/s  Systemic Diam: 1.80 cm MV A velocity: 99.40 cm/s MV E/A ratio:  0.92 Mihai Croitoru MD Electronically signed by Sanda Klein MD Signature Date/Time: 09/19/2021/6:13:37 PM    Final    VAS US DOPPLER PRE CABG  Result Date: 09/19/2021 PREOPERATIVE VASCULAR EVALUATION  Patient Name:  VERSIE FLEENER  Date of Exam:   09/19/2021 Medical Rec #: [id number]     Accession #:    [id number] Date of Birth: 1946-09-15     Patient Gender: M Patient Age:   50 years Exam Location:  [facility] Procedure:      VAS US DOPPLER PRE CABG Referring Phys: Jenkins Rouge --------------------------------------------------------------------------------  Indications:      Pre-CABG. Risk Factors:     Hypertension, hyperlipidemia, Diabetes, no history of smoking,                   prior MI, coronary artery disease, prior CVA. Other Factors:    Carotid artery stenosis. Comparison Study: Previous carotid duplex on 02/01/2019 - LT ICA 40-59% Performing Technologist: Rogelia Rohrer RVT, RDMS  Examination Guidelines: A complete evaluation includes B-mode imaging, spectral Doppler, color Doppler, and power Doppler as needed of all accessible portions of each vessel. Bilateral testing is considered an integral part of a complete examination. Limited examinations for reoccurring indications may be performed as noted.  Right Carotid Findings: +----------+-------+--------+--------+-----------------------+-----------------+           PSV    EDV cm/sStenosisDescribe  Comments                    cm/s                                                            +----------+-------+--------+--------+-----------------------+-----------------+ CCA Prox  76     10                                     intimal                                                                   thickening        +----------+-------+--------+--------+-----------------------+-----------------+ CCA Distal76     12                                     intimal                                                                   thickening        +----------+-------+--------+--------+-----------------------+-----------------+ ICA Prox  79     18              calcific and                                                               heterogenous                             +----------+-------+--------+--------+-----------------------+-----------------+ ICA Distal59     12                                                       +----------+-------+--------+--------+-----------------------+-----------------+ ECA       169                                                             +----------+-------+--------+--------+-----------------------+-----------------+ +----------+--------+-------+--------+------------+           PSV cm/sEDV cmsDescribeArm Pressure +----------+--------+-------+--------+------------+ [id number]                                  +----------+--------+-------+--------+------------+ +---------+--------+--+--------+--+  VertebralPSV cm/s42EDV cm/s10 +---------+--------+--+--------+--+ Left Carotid Findings: +----------+-------+--------+--------+-----------------------+-----------------+           PSV    EDV cm/sStenosisDescribe               Comments                    cm/s                                                            +----------+-------+--------+--------+-----------------------+-----------------+ CCA Prox  73     12                                                       +----------+-------+--------+--------+-----------------------+-----------------+ CCA Distal82     14                                     intimal                                                                   thickening        +----------+-------+--------+--------+-----------------------+-----------------+ ICA Prox  124    24      40-59%  calcific and                                                              heterogenous                             +----------+-------+--------+--------+-----------------------+-----------------+ ICA Mid   128    22      40-59%  hypoechoic                                +----------+-------+--------+--------+-----------------------+-----------------+ ICA Distal82     15                                                       +----------+-------+--------+--------+-----------------------+-----------------+ ECA       159    0                                                        +----------+-------+--------+--------+-----------------------+-----------------+ +----------+--------+--------+--------+------------+ SubclavianPSV cm/sEDV cm/sDescribeArm Pressure +----------+--------+--------+--------+------------+  131                                  +----------+--------+--------+--------+------------+ +---------+--------+--+--------+--+ VertebralPSV cm/s46EDV cm/s10 +---------+--------+--+--------+--+  ABI Findings: +---------+------------------+-----+----------+--------+ Right    Rt Pressure (mmHg)IndexWaveform  Comment  +---------+------------------+-----+----------+--------+ Brachial 130                    triphasic          +---------+------------------+-----+----------+--------+ PTA      121               0.88 monophasic         +---------+------------------+-----+----------+--------+ DP       156               1.14 triphasic          +---------+------------------+-----+----------+--------+ Great Toe                       Normal             +---------+------------------+-----+----------+--------+ +---------+------------------+-----+---------+-------+ Left     Lt Pressure (mmHg)IndexWaveform Comment +---------+------------------+-----+---------+-------+ Brachial 137                    triphasic        +---------+------------------+-----+---------+-------+ PTA      123               0.90 biphasic         +---------+------------------+-----+---------+-------+ DP       120               0.88 biphasic         +---------+------------------+-----+---------+-------+ Great Toe                        Normal           +---------+------------------+-----+---------+-------+ +-------+---------------+----------------+ ABI/TBIToday's ABI/TBIPrevious ABI/TBI +-------+---------------+----------------+ Right  1.14 / 0.83                     +-------+---------------+----------------+ Left   0.90 / 0.69                     +-------+---------------+----------------+  Right Doppler Findings: +--------+--------+-----+---------+-----------------------------+ Site    PressureIndexDoppler  Comments                      +--------+--------+-----+---------+-----------------------------+ Brachial130          triphasic                              +--------+--------+-----+---------+-----------------------------+ Radial               biphasic RT radial heart cath 09/18/21 +--------+--------+-----+---------+-----------------------------+ Ulnar                triphasic                              +--------+--------+-----+---------+-----------------------------+  Left Doppler Findings: +--------+--------+-----+---------+--------+ Site    PressureIndexDoppler  Comments +--------+--------+-----+---------+--------+ [id number]          triphasic         +--------+--------+-----+---------+--------+ Radial               triphasic         +--------+--------+-----+---------+--------+ Ulnar  triphasic         +--------+--------+-----+---------+--------+  Summary: Right Carotid: The extracranial vessels were near-normal with only minimal wall                thickening or plaque. Left Carotid: Velocities in the left ICA are consistent with a 40-59% stenosis.               The CCA nad ECA were near-normal with only minimal wall thickening               or plaque. Vertebrals:  Bilateral vertebral arteries demonstrate antegrade flow. Subclavians: Normal flow hemodynamics were seen in bilateral subclavian              arteries. Right ABI: Resting right ankle-brachial index  is within normal range. No evidence of significant right lower extremity arterial disease. The right toe-brachial index is normal. 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 The left toe-brachial index is normal. Right Upper Extremity: Doppler waveform obliterate with right radial compression. Doppler waveforms remain within normal limits with right ulnar compression. Left Upper Extremity: Doppler waveforms remain within normal limits with left radial compression. Doppler waveforms remain within normal limits with left ulnar compression.  Electronically signed by Orlie Pollen on 09/19/2021 at 7:52:00 PM.    Final     Cardiac Studies   Pending echo    LEFT HEART CATH AND CORONARY ANGIOGRAPHY   Conclusion  CONCLUSIONS: 99% proximal LAD within a heavily calcified segment followed by 80% mid stenosis and 95% apical stenosis.  LAD flow is TIMI grade II.  First and second diagonals have severe obstructive disease. Left main is widely patent. The circumflex is codominant.  30% proximal circumflex, 99% first obtuse marginal, 70 to 80% proximal large second obtuse marginal.  The second obtuse marginal has high-grade distal disease in a trifurcation.  The PDA of the left circumflex contains proximal to mid 90% stenosis. RCA is nondominant and contains segmental 70% stenosis before a large right ventricular branch. Anteroapical wall motion abnormality, EF 40 to 45%, apical dyskinesis, and LVEDP 14 mmHg.   RECOMMENDATIONS:   Surgical anatomy.  TCTS consultation is recommended. RFM.  Diagnostic Dominance: Left   Patient Profile     75 y.o. male with a history of hypertension, hyperlipidemia, diabetes, and prior stroke in 01/2019 s/p loop recorder which showed no evidence of atrial fibrillation who presented to [facility] ED on 09/17/2021 with right arm pain and ruled in for non-STEMI.  He was transferred to [facility] on 09/18/2021 for  further evaluation with cardiac catheterization.  Assessment & Plan    NSTEMI/CAD - Troponin I elevated at 0.88 >> 14.00 >> 16.30 Outside hospital. EKG shows Q waves in leads V1-V3 and involving T wave changes in anterior lateral leads. Cardiac cath showed multivessel CAD. CABG Tuesday next week Keep in hospital on iv nitro and heparin given severity of dx.  - Continue ASA and statin. - Continue heparin  -Coreg low dose with some bradycardia.  - Pre CABG dopplers  with 40-59% left ICA stenosis Left ABI only mildly decreased 0.9   2. Ischemic cardiomyopathy - Echo at [facility] reportedly showed diminished LVEF with segmental wall motion abnormality indicating problem with known proximal LAD (unable to personally see report). - TTE with EF 45-50%  - BB as summarized above - Home lisinopril on hold,likely start post op - Euvolemic with good diuresis   3. HTN - BP currently soft - As  above   4. HLD - 08/28/2021: Cholesterol, Total 226; HDL 28; LDL Chol Calc (NIH) 150; Triglycerides 258  - Continue high intensity statin  - Consider OP f/u labs 6-8 weeks given statin initiation this admission.   5. DM - HgbA1c 7.3 - Metformin on hold - SSI while here  For questions or updates, please contact [place] Please consult [email or web address] for contact info under        Signed, Jenkins Rouge, MD  09/21/2021, 9:16 AM

## 2021-09-22 ENCOUNTER — Inpatient Hospital Stay (HOSPITAL_COMMUNITY): Payer: Medicare HMO

## 2021-09-22 DIAGNOSIS — I214 Non-ST elevation (NSTEMI) myocardial infarction: Secondary | ICD-10-CM | POA: Diagnosis not present

## 2021-09-22 LAB — CBC
HCT: 44.9 % (ref 39.0–52.0)
Hemoglobin: 15.3 g/dL (ref 13.0–17.0)
MCH: 29.7 pg (ref 26.0–34.0)
MCHC: 34.1 g/dL (ref 30.0–36.0)
MCV: 87.2 fL (ref 80.0–100.0)
Platelets: 309 10*3/uL (ref 150–400)
RBC: 5.15 MIL/uL (ref 4.22–5.81)
RDW: 13.1 % (ref 11.5–15.5)
WBC: 18.4 10*3/uL — ABNORMAL HIGH (ref 4.0–10.5)
nRBC: 0 % (ref 0.0–0.2)

## 2021-09-22 LAB — BLOOD GAS, ARTERIAL
Acid-base deficit: 2.3 mmol/L — ABNORMAL HIGH (ref 0.0–2.0)
Bicarbonate: 21.2 mmol/L (ref 20.0–28.0)
Drawn by: 38235
FIO2: 27
O2 Saturation: 98.1 %
Patient temperature: 37
pCO2 arterial: 31.7 mmHg — ABNORMAL LOW (ref 32.0–48.0)
pH, Arterial: 7.441 (ref 7.350–7.450)
pO2, Arterial: 98.7 mmHg (ref 83.0–108.0)

## 2021-09-22 LAB — HEPARIN LEVEL (UNFRACTIONATED)
Heparin Unfractionated: 0.12 IU/mL — ABNORMAL LOW (ref 0.30–0.70)
Heparin Unfractionated: 0.53 IU/mL (ref 0.30–0.70)
Heparin Unfractionated: 0.53 IU/mL (ref 0.30–0.70)

## 2021-09-22 LAB — PROTIME-INR
INR: 1.1 (ref 0.8–1.2)
Prothrombin Time: 14.3 seconds (ref 11.4–15.2)

## 2021-09-22 LAB — SURGICAL PCR SCREEN
MRSA, PCR: NEGATIVE
Staphylococcus aureus: NEGATIVE

## 2021-09-22 LAB — GLUCOSE, CAPILLARY
Glucose-Capillary: 201 mg/dL — ABNORMAL HIGH (ref 70–99)
Glucose-Capillary: 293 mg/dL — ABNORMAL HIGH (ref 70–99)
Glucose-Capillary: 206 mg/dL — ABNORMAL HIGH (ref 70–99)
Glucose-Capillary: 219 mg/dL — ABNORMAL HIGH (ref 70–99)
Glucose-Capillary: 218 mg/dL — ABNORMAL HIGH (ref 70–99)

## 2021-09-22 LAB — APTT: aPTT: 63 seconds — ABNORMAL HIGH (ref 24–36)

## 2021-09-22 LAB — HEMOGLOBIN A1C
Hgb A1c MFr Bld: 7.2 % — ABNORMAL HIGH (ref 4.8–5.6)
Mean Plasma Glucose: 159.94 mg/dL

## 2021-09-22 LAB — ABO/RH: ABO/RH(D): O POS

## 2021-09-22 MED ORDER — NOREPINEPHRINE 4 MG/250ML-% IV SOLN
0.0000 ug/min | INTRAVENOUS | Status: DC
  Filled 2021-09-22: qty 250

## 2021-09-22 MED ORDER — INSULIN REGULAR(HUMAN) IN NACL 100-0.9 UT/100ML-% IV SOLN
INTRAVENOUS | Status: AC
  Administered 2021-09-24: 1 [IU]/h via INTRAVENOUS
  Filled 2021-09-22 (×2): qty 100

## 2021-09-22 MED ORDER — DEXMEDETOMIDINE HCL IN NACL 400 MCG/100ML IV SOLN
0.1000 ug/kg/h | INTRAVENOUS | Status: AC
  Administered 2021-09-24: .5 ug/kg/h via INTRAVENOUS
  Filled 2021-09-22 (×2): qty 100

## 2021-09-22 MED ORDER — MANNITOL 20 % IV SOLN
INTRAVENOUS | Status: DC
  Filled 2021-09-22: qty 13

## 2021-09-22 MED ORDER — CEFAZOLIN SODIUM-DEXTROSE 2-4 GM/100ML-% IV SOLN
2.0000 g | INTRAVENOUS | Status: AC
  Administered 2021-09-24: 2 g via INTRAVENOUS
  Filled 2021-09-22: qty 100

## 2021-09-22 MED ORDER — POTASSIUM CHLORIDE 2 MEQ/ML IV SOLN
80.0000 meq | INTRAVENOUS | Status: DC
  Filled 2021-09-22: qty 40

## 2021-09-22 MED ORDER — TRANEXAMIC ACID (OHS) BOLUS VIA INFUSION
15.0000 mg/kg | INTRAVENOUS | Status: AC
  Administered 2021-09-24: 1275 mg via INTRAVENOUS
  Filled 2021-09-22: qty 1275

## 2021-09-22 MED ORDER — MILRINONE LACTATE IN DEXTROSE 20-5 MG/100ML-% IV SOLN
0.3000 ug/kg/min | INTRAVENOUS | Status: DC
  Filled 2021-09-22: qty 100

## 2021-09-22 MED ORDER — VANCOMYCIN HCL 1250 MG/250ML IV SOLN
1250.0000 mg | INTRAVENOUS | Status: AC
  Administered 2021-09-24: 1250 mg via INTRAVENOUS
  Filled 2021-09-22: qty 250

## 2021-09-22 MED ORDER — HEPARIN 30,000 UNITS/1000 ML (OHS) CELLSAVER SOLUTION
Status: DC
  Filled 2021-09-22: qty 1000

## 2021-09-22 MED ORDER — EPINEPHRINE HCL 5 MG/250ML IV SOLN IN NS
0.0000 ug/min | INTRAVENOUS | Status: DC
  Filled 2021-09-22: qty 250

## 2021-09-22 MED ORDER — TRANEXAMIC ACID (OHS) PUMP PRIME SOLUTION
2.0000 mg/kg | INTRAVENOUS | Status: DC
  Filled 2021-09-22: qty 1.7

## 2021-09-22 MED ORDER — PLASMA-LYTE A IV SOLN
INTRAVENOUS | Status: DC
  Filled 2021-09-22: qty 5

## 2021-09-22 MED ORDER — TRANEXAMIC ACID 1000 MG/10ML IV SOLN
1.5000 mg/kg/h | INTRAVENOUS | Status: AC
  Administered 2021-09-24: 1.5 mg/kg/h via INTRAVENOUS
  Filled 2021-09-22: qty 25

## 2021-09-22 MED ORDER — PHENYLEPHRINE HCL-NACL 20-0.9 MG/250ML-% IV SOLN
30.0000 ug/min | INTRAVENOUS | Status: AC
Start: 2021-09-24 — End: 2021-09-24
  Administered 2021-09-24: 50 ug/min via INTRAVENOUS
  Filled 2021-09-22: qty 250

## 2021-09-22 MED ORDER — NITROGLYCERIN IN D5W 200-5 MCG/ML-% IV SOLN
2.0000 ug/min | INTRAVENOUS | Status: DC
  Filled 2021-09-22: qty 250

## 2021-09-22 NOTE — Progress Notes (Signed)
ANTICOAGULATION CONSULT NOTE   Pharmacy Consult for heparin Indication: CAD   Allergies  Allergen Reactions   Aricept [Donepezil] Nausea Only    Patient Measurements: Height: 5\' 9"  (175.3 cm) Weight: 85 kg (187 lb 6.3 oz) IBW/kg (Calculated) : 70.7 Heparin Dosing Weight: 83 kg  Vital Signs: Temp: 97.9 F (36.6 C) (10/21 2012) Temp Source: Oral (10/21 2012) BP: 112/61 (10/21 2012) Pulse Rate: 71 (10/21 2012)  Labs: Recent Labs    09/20/21 0708 09/20/21 1735 09/21/21 0127 09/22/21 0212  HGB 14.3  --  14.7 15.3  HCT 41.8  --  42.7 44.9  PLT 315  --  311 309  HEPARINUNFRC 0.29* 0.36 0.38 0.12*     CrCl cannot be calculated (Patient's most recent lab result is older than the maximum 21 days allowed.).  Assessment: 75 yo male with CAD awaiting CABG for heparin  Goal of Therapy:  Heparin level 0.3-0.7 units/ml Monitor platelets by anticoagulation protocol: Yes   Plan:  Increase Heparin 1600 units/hr Check heparin level in 6 hours.   Phillis Knack, PharmD, BCPS

## 2021-09-22 NOTE — Progress Notes (Addendum)
ANTICOAGULATION CONSULT NOTE - Follow Up Consult  Pharmacy Consult for Heparin Indication:  NSTEMI  Allergies  Allergen Reactions   Aricept [Donepezil] Nausea Only    Patient Measurements: Height: 5\' 9"  (175.3 cm) Weight: 85 kg (187 lb 6.3 oz) IBW/kg (Calculated) : 70.7 Heparin Dosing Weight: 83 kg  Vital Signs: Temp: 97.9 F (36.6 C) (10/22 0744) Temp Source: Oral (10/22 0744) BP: 119/85 (10/22 1058) Pulse Rate: 73 (10/22 1058)  Labs: Recent Labs    09/20/21 0708 09/20/21 1735 09/21/21 0127 09/22/21 0212 09/22/21 1130  HGB 14.3  --  14.7 15.3  --   HCT 41.8  --  42.7 44.9  --   PLT 315  --  311 309  --   HEPARINUNFRC 0.29*   < > 0.38 0.12* 0.53   < > = values in this interval not displayed.    CrCl cannot be calculated (Patient's most recent lab result is older than the maximum 21 days allowed.).   Medications:  Scheduled:   aspirin EC  81 mg Oral Daily   carvedilol  3.125 mg Oral BID WC   insulin aspart  0-15 Units Subcutaneous TID WC   rosuvastatin  40 mg Oral Daily   sodium chloride flush  3 mL Intravenous Q12H   Infusions:   sodium chloride     heparin 1,600 Units/hr (09/22/21 0418)   nitroGLYCERIN Stopped (09/18/21 2215)    Assessment: 75 yo M admitted with chest pain, s/p cath revealing multivessel CAD. Pharmacy consulted to dose IV heparin while awaiting consult for OHS. Hx GIB in the past. Plans noted for CABG on Monday 10/24.   Heparin level therapeutic at 0.53, on 1600 units/hr.  CBC stable. No line issues or signs/symptoms of bleeding noted per RN.   Goal of Therapy:  Heparin level 0.3-0.7 units/ml Monitor platelets by anticoagulation protocol: Yes  Plan:  Continue heparin at 1600 units/hr.  Check ~8hr heparin level.  Daily CBC, heparin level.  Monitor for signs/symptoms of bleeding.    Vance Peper, PharmD PGY1 Pharmacy Resident Phone [phone] 09/22/2021 1:28 PM   Please check AMION for all [place] phone numbers After 10:00  PM, call [place] [phone]

## 2021-09-22 NOTE — Progress Notes (Signed)
Patient has already walked in the hall this morning.  Reinforced to ambulate 3-4 times per day before OHS.  Reviewed pre-op education and answered questions. [phone]

## 2021-09-22 NOTE — Progress Notes (Signed)
Progress Note  Patient Name: Jon Nelson Date of Encounter: 09/22/2021  [facility] Cardiologist: Jenne Campus, MD   Subjective  NAEO. No chest pain.  Inpatient Medications    Scheduled Meds:  aspirin EC  81 mg Oral Daily   carvedilol  3.125 mg Oral BID WC   insulin aspart  0-15 Units Subcutaneous TID WC   rosuvastatin  40 mg Oral Daily   sodium chloride flush  3 mL Intravenous Q12H   Continuous Infusions:  sodium chloride     heparin 1,600 Units/hr (09/22/21 0418)   nitroGLYCERIN Stopped (09/18/21 2215)   PRN Meds: sodium chloride, acetaminophen, nitroGLYCERIN, ondansetron (ZOFRAN) IV, sodium chloride flush   Vital Signs    Vitals:   09/21/21 1402 09/21/21 2012 09/22/21 0525 09/22/21 0744  BP: 125/85 112/61 132/60 110/77  Pulse: 68 71 69 (!) 58  Resp: 17 10 20 18   Temp: 97.8 F (36.6 C) 97.9 F (36.6 C) 97.9 F (36.6 C) 97.9 F (36.6 C)  TempSrc: Oral Oral Oral Oral  SpO2: 96% 94% 100% 97%  Weight:      Height:        Intake/Output Summary (Last 24 hours) at 09/22/2021 0935 Last data filed at 09/22/2021 0700 Gross per 24 hour  Intake 766.29 ml  Output 2000 ml  Net -1233.71 ml    Last 3 Weights 09/20/2021 09/18/2021 08/28/2021  Weight (lbs) 187 lb 6.3 oz 187 lb 6.3 oz 183 lb 11.2 oz  Weight (kg) 85 kg 85 kg 83.326 kg      Telemetry    Personally Reviewed   ECG    Personally Reviewed  Physical Exam   GEN: No acute distress.   Neck: No JVD Cardiac: RRR, no murmurs, rubs, or gallops. R radial cath site without hematoma  Respiratory: Clear to auscultation bilaterally. GI: Soft, nontender, non-distended  MS: No edema; No deformity. Neuro:  Nonfocal  Psych: Normal affect   Labs     Hematology Recent Labs  Lab 09/20/21 0708 09/21/21 0127 09/22/21 0212  WBC 16.6* 17.4* 18.4*  RBC 4.78 4.88 5.15  HGB 14.3 14.7 15.3  HCT 41.8 42.7 44.9  MCV 87.4 87.5 87.2  MCH 29.9 30.1 29.7  MCHC 34.2 34.4 34.1  RDW 13.1 13.1 13.1  PLT  315 311 309     Radiology    No results found.  Cardiac Studie   09/19/2021 Echo   1. No left ventricular thrombus is seen with Definity contrast. Left  ventricular ejection fraction, by estimation, is 45 to 50%. The left  ventricle has mildly decreased function. The left ventricle demonstrates  regional wall motion abnormalities (see  scoring diagram/findings for description). Left ventricular diastolic  parameters were normal. There is moderate hypokinesis of the left  ventricular, apical septal wall, inferior wall, apical segment and  anterior wall.   2. Right ventricular systolic function is normal. The right ventricular  size is normal.   3. Left atrial size was mildly dilated.   4. The mitral valve is normal in structure. No evidence of mitral valve  regurgitation. No evidence of mitral stenosis.   5. The aortic valve is normal in structure. There is mild calcification  of the aortic valve. Aortic valve regurgitation is not visualized. Mild  aortic valve sclerosis is present, with no evidence of aortic valve  stenosis.   6. The inferior vena cava is normal in size with greater than 50%  respiratory variability, suggesting right atrial pressure of 3 mmHg.   Patient  Profile     75 y.o. male with a history of hypertension, hyperlipidemia, diabetes, and prior stroke in 01/2019 s/p loop recorder which showed no evidence of atrial fibrillation who presented to [facility] ED on 09/17/2021 with right arm pain and ruled in for non-STEMI.  He was transferred to [facility] on 09/18/2021 for further evaluation with cardiac catheterization.  Assessment & Plan    NSTEMI/CAD - Continue ASA and statin. - Continue heparin  -Coreg low dose with some bradycardia.  - CABG planned for Monday  2. Ischemic cardiomyopathy - TTE with EF 45-50%  - BB as summarized above - Home lisinopril on hold,likely start post op - Euvolemic with good diuresis   3. HTN  4. HLD - Continue  high intensity statin   5. DM - SSI while here  For questions or updates, please contact [place] Please consult [email or web address] for contact info under        Signed, Vickie Epley, MD  09/22/2021, 9:35 AM

## 2021-09-22 NOTE — Progress Notes (Signed)
CCMD advised pt had pause 2.2 secs at 0708 and 3.2 secs at 0709. Notified provider

## 2021-09-22 NOTE — Progress Notes (Signed)
ANTICOAGULATION CONSULT NOTE - Follow Up Consult  Pharmacy Consult for Heparin Indication:  NSTEMI  Allergies  Allergen Reactions   Aricept [Donepezil] Nausea Only    Patient Measurements: Height: 5\' 9"  (175.3 cm) Weight: 85 kg (187 lb 6.3 oz) IBW/kg (Calculated) : 70.7 Heparin Dosing Weight: 83 kg  Vital Signs: Temp: 97.9 F (36.6 C) (10/22 2037) Temp Source: Oral (10/22 2037) BP: 134/74 (10/22 2037) Pulse Rate: 61 (10/22 2037)  Labs: Recent Labs    09/20/21 0708 09/20/21 1735 09/21/21 0127 09/22/21 0212 09/22/21 1130 09/22/21 1643 09/22/21 2013  HGB 14.3  --  14.7 15.3  --   --   --   HCT 41.8  --  42.7 44.9  --   --   --   PLT 315  --  311 309  --   --   --   APTT  --   --   --   --   --  63*  --   LABPROT  --   --   --   --   --  14.3  --   INR  --   --   --   --   --  1.1  --   HEPARINUNFRC 0.29*   < > 0.38 0.12* 0.53  --  0.53   < > = values in this interval not displayed.     CrCl cannot be calculated (Patient's most recent lab result is older than the maximum 21 days allowed.).   Medications:  Scheduled:   aspirin EC  81 mg Oral Daily   carvedilol  3.125 mg Oral BID WC   [START ON 09/24/2021] epinephrine  0-10 mcg/min Intravenous To OR   [START ON 09/24/2021] heparin-papaverine-plasmalyte irrigation   Irrigation To OR   insulin aspart  0-15 Units Subcutaneous TID WC   [START ON 09/24/2021] insulin   Intravenous To OR   [START ON 09/24/2021] [facility] Blood Cardioplegia vial (lidocaine/magnesium/mannitol 0.26g-4g-6.4g)   Intracoronary To OR   [START ON 09/24/2021] phenylephrine  30-200 mcg/min Intravenous To OR   [START ON 09/24/2021] potassium chloride  80 mEq Other To OR   rosuvastatin  40 mg Oral Daily   sodium chloride flush  3 mL Intravenous Q12H   [START ON 09/24/2021] tranexamic acid  15 mg/kg Intravenous To OR   [START ON 09/24/2021] tranexamic acid  2 mg/kg Intracatheter To OR   Infusions:   sodium chloride     [START ON 09/24/2021]   ceFAZolin (ANCEF) IV     [START ON 09/24/2021]  ceFAZolin (ANCEF) IV     [START ON 09/24/2021] dexmedetomidine     [START ON 09/24/2021] heparin 30,000 units/NS 1000 mL solution for CELLSAVER     heparin 1,600 Units/hr (09/22/21 1551)   [START ON 09/24/2021] milrinone     nitroGLYCERIN Stopped (09/18/21 2215)   [START ON 09/24/2021] nitroGLYCERIN     [START ON 09/24/2021] norepinephrine     [START ON 09/24/2021] tranexamic acid (CYKLOKAPRON) infusion (OHS)     [START ON 09/24/2021] vancomycin      Assessment: 75 yo M admitted with chest pain, s/p cath revealing multivessel CAD. Pharmacy consulted to dose IV heparin while awaiting consult for OHS. Hx GIB in the past. Plans noted for CABG on Monday 10/24.   Heparin level remains therapeutic at 0.53 this evening.  Goal of Therapy:  Heparin level 0.3-0.7 units/ml Monitor platelets by anticoagulation protocol: Yes  Plan:  Continue heparin at 1600 units/hr Daily heparin level and CBC  Arrie Senate, PharmD, BCPS, [facility] Clinical Pharmacist [phone] Please check AMION for all [place] numbers 09/22/2021

## 2021-09-23 DIAGNOSIS — I214 Non-ST elevation (NSTEMI) myocardial infarction: Secondary | ICD-10-CM | POA: Diagnosis not present

## 2021-09-23 LAB — CBC
HCT: 45.8 % (ref 39.0–52.0)
Hemoglobin: 15.8 g/dL (ref 13.0–17.0)
MCH: 30 pg (ref 26.0–34.0)
MCHC: 34.5 g/dL (ref 30.0–36.0)
MCV: 87.1 fL (ref 80.0–100.0)
Platelets: 338 10*3/uL (ref 150–400)
RBC: 5.26 MIL/uL (ref 4.22–5.81)
RDW: 13.2 % (ref 11.5–15.5)
WBC: 22.5 10*3/uL — ABNORMAL HIGH (ref 4.0–10.5)
nRBC: 0 % (ref 0.0–0.2)

## 2021-09-23 LAB — URINALYSIS, ROUTINE W REFLEX MICROSCOPIC
Bilirubin Urine: NEGATIVE
Glucose, UA: NEGATIVE mg/dL
Hgb urine dipstick: NEGATIVE
Ketones, ur: NEGATIVE mg/dL
Leukocytes,Ua: NEGATIVE
Nitrite: NEGATIVE
Protein, ur: NEGATIVE mg/dL
Specific Gravity, Urine: 1.017 (ref 1.005–1.030)
pH: 6 (ref 5.0–8.0)

## 2021-09-23 LAB — HEPARIN LEVEL (UNFRACTIONATED)
Heparin Unfractionated: 0.35 IU/mL (ref 0.30–0.70)
Heparin Unfractionated: 0.52 IU/mL (ref 0.30–0.70)

## 2021-09-23 LAB — GLUCOSE, CAPILLARY
Glucose-Capillary: 188 mg/dL — ABNORMAL HIGH (ref 70–99)
Glucose-Capillary: 183 mg/dL — ABNORMAL HIGH (ref 70–99)
Glucose-Capillary: 300 mg/dL — ABNORMAL HIGH (ref 70–99)
Glucose-Capillary: 240 mg/dL — ABNORMAL HIGH (ref 70–99)

## 2021-09-23 LAB — BASIC METABOLIC PANEL
Anion gap: 7 (ref 5–15)
BUN: 24 mg/dL — ABNORMAL HIGH (ref 8–23)
CO2: 23 mmol/L (ref 22–32)
Calcium: 8.6 mg/dL — ABNORMAL LOW (ref 8.9–10.3)
Chloride: 100 mmol/L (ref 98–111)
Creatinine, Ser: 0.86 mg/dL (ref 0.61–1.24)
GFR, Estimated: >60 mL/min (ref >60)
Glucose, Bld: 205 mg/dL — ABNORMAL HIGH (ref 70–99)
Potassium: 5 mmol/L (ref 3.5–5.1)
Sodium: 130 mmol/L — ABNORMAL LOW (ref 135–145)

## 2021-09-23 MED ORDER — METOPROLOL TARTRATE 12.5 MG HALF TABLET
12.5000 mg | ORAL_TABLET | Freq: Once | ORAL | Status: AC
  Administered 2021-09-24: 12.5 mg via ORAL
  Filled 2021-09-23: qty 1

## 2021-09-23 MED ORDER — CHLORHEXIDINE GLUCONATE 0.12 % MT SOLN
15.0000 mL | Freq: Once | OROMUCOSAL | Status: AC
  Administered 2021-09-23: 15 mL via OROMUCOSAL

## 2021-09-23 MED ORDER — TEMAZEPAM 15 MG PO CAPS
15.0000 mg | ORAL_CAPSULE | Freq: Once | ORAL | Status: DC | PRN
Start: 2021-09-23 — End: 2021-09-24

## 2021-09-23 MED ORDER — BISACODYL 5 MG PO TBEC: 5.0000 mg | DELAYED_RELEASE_TABLET | Freq: Once | ORAL | Status: DC

## 2021-09-23 MED ORDER — CHLORHEXIDINE GLUCONATE CLOTH 2 % EX PADS
6.0000 | MEDICATED_PAD | Freq: Once | CUTANEOUS | Status: AC
  Administered 2021-09-24: 6 via TOPICAL

## 2021-09-23 MED ORDER — CHLORHEXIDINE GLUCONATE CLOTH 2 % EX PADS
6.0000 | MEDICATED_PAD | Freq: Once | CUTANEOUS | Status: AC
  Administered 2021-09-23: 6 via TOPICAL

## 2021-09-23 NOTE — Anesthesia Preprocedure Evaluation (Addendum)
Anesthesia Evaluation  Patient identified by MRN, date of birth, ID band Patient awake    Reviewed: Allergy & Precautions, NPO status , Patient's Chart, lab work & pertinent test results  Airway Mallampati: II  TM Distance: >3 FB Neck ROM: Full    Dental  (+) Dental Advisory Given, Poor Dentition, Loose, Missing,    Pulmonary neg pulmonary ROS,    Pulmonary exam normal breath sounds clear to auscultation       Cardiovascular hypertension, Pt. on medications + Past MI and + Peripheral Vascular Disease  Normal cardiovascular exam Rhythm:Regular Rate:Normal  Echo 09/19/21: 1. No left ventricular thrombus is seen with Definity contrast. Left ventricular ejection fraction, by estimation, is 45 to 50%. The left ventricle has mildly decreased function. The left ventricle demonstrates regional wall motion abnormalities (see scoring diagram/findings for description). Left ventricular diastolic parameters were normal. There is moderate hypokinesis of the left  ventricular, apical septal wall, inferior wall, apical segment and anterior wall.  2. Right ventricular systolic function is normal. The right ventricular size is normal.  3. Left atrial size was mildly dilated.  4. The mitral valve is normal in structure. No evidence of mitral valve regurgitation. No evidence of mitral stenosis.  5. The aortic valve is normal in structure. There is mild calcification of the aortic valve. Aortic valve regurgitation is not visualized. Mild aortic valve sclerosis is present, with no evidence of aortic valve stenosis.  6. The inferior vena cava is normal in size with greater than 50% respiratory variability, suggesting right atrial pressure of 3 mmHg.    Neuro/Psych TIACVA    GI/Hepatic negative GI ROS, Neg liver ROS,   Endo/Other  diabetes, Type 2, Oral Hypoglycemic Agents  Renal/GU Renal disease     Musculoskeletal negative musculoskeletal  ROS (+)   Abdominal   Peds  Hematology negative hematology ROS (+)   Anesthesia Other Findings   Reproductive/Obstetrics                            Anesthesia Physical Anesthesia Plan  ASA: 4  Anesthesia Plan: General   Post-op Pain Management:    Induction: Intravenous  PONV Risk Score and Plan: 2 and Midazolam and Treatment may vary due to age or medical condition  Airway Management Planned: Oral ETT  Additional Equipment: Arterial line, CVP, TEE and Ultrasound Guidance Line Placement  Intra-op Plan:   Post-operative Plan: Post-operative intubation/ventilation  Informed Consent: I have reviewed the patients History and Physical, chart, labs and discussed the procedure including the risks, benefits and alternatives for the proposed anesthesia with the patient or authorized representative who has indicated his/her understanding and acceptance.     Dental advisory given  Plan Discussed with: CRNA  Anesthesia Plan Comments: (FLOW TRAC)       Anesthesia Quick Evaluation

## 2021-09-23 NOTE — Progress Notes (Signed)
Progress Note  Patient Name: Jon Nelson Date of Encounter: 09/23/2021  [facility] Cardiologist: Jenne Campus, MD   Subjective  NAEO. No chest pain. CABG planned for tomorrow.  Inpatient Medications    Scheduled Meds:  aspirin EC  81 mg Oral Daily   carvedilol  3.125 mg Oral BID WC   [START ON 09/24/2021] epinephrine  0-10 mcg/min Intravenous To OR   [START ON 09/24/2021] heparin-papaverine-plasmalyte irrigation   Irrigation To OR   insulin aspart  0-15 Units Subcutaneous TID WC   [START ON 09/24/2021] insulin   Intravenous To OR   [START ON 09/24/2021] [facility] Blood Cardioplegia vial (lidocaine/magnesium/mannitol 0.26g-4g-6.4g)   Intracoronary To OR   [START ON 09/24/2021] phenylephrine  30-200 mcg/min Intravenous To OR   [START ON 09/24/2021] potassium chloride  80 mEq Other To OR   rosuvastatin  40 mg Oral Daily   sodium chloride flush  3 mL Intravenous Q12H   [START ON 09/24/2021] tranexamic acid  15 mg/kg Intravenous To OR   [START ON 09/24/2021] tranexamic acid  2 mg/kg Intracatheter To OR   Continuous Infusions:  sodium chloride     [START ON 09/24/2021]  ceFAZolin (ANCEF) IV     [START ON 09/24/2021]  ceFAZolin (ANCEF) IV     [START ON 09/24/2021] dexmedetomidine     [START ON 09/24/2021] heparin 30,000 units/NS 1000 mL solution for CELLSAVER     heparin 1,600 Units/hr (09/23/21 0206)   [START ON 09/24/2021] milrinone     nitroGLYCERIN Stopped (09/18/21 2215)   [START ON 09/24/2021] nitroGLYCERIN     [START ON 09/24/2021] norepinephrine     [START ON 09/24/2021] tranexamic acid (CYKLOKAPRON) infusion (OHS)     [START ON 09/24/2021] vancomycin     PRN Meds: sodium chloride, acetaminophen, nitroGLYCERIN, ondansetron (ZOFRAN) IV, sodium chloride flush   Vital Signs    Vitals:   09/22/21 1747 09/22/21 2037 09/23/21 0500 09/23/21 0823  BP: 133/75 134/74 (!) 108/56 124/70  Pulse: 76 61 61 63  Resp:  17 16   Temp:  97.9 F (36.6 C) 97.7 F (36.5  C)   TempSrc:  Oral Oral   SpO2:  99% 92%   Weight:      Height:        Intake/Output Summary (Last 24 hours) at 09/23/2021 0830 Last data filed at 09/23/2021 0700 Gross per 24 hour  Intake 565.26 ml  Output 875 ml  Net -309.74 ml    Last 3 Weights 09/20/2021 09/18/2021 08/28/2021  Weight (lbs) 187 lb 6.3 oz 187 lb 6.3 oz 183 lb 11.2 oz  Weight (kg) 85 kg 85 kg 83.326 kg      Telemetry    Personally Reviewed   ECG    Personally Reviewed  Physical Exam   GEN: No acute distress.   Neck: No JVD Cardiac: RRR, no murmurs, rubs, or gallops. R radial cath site without hematoma  Respiratory: Clear to auscultation bilaterally. GI: Soft, nontender, non-distended  MS: No edema; No deformity. Neuro:  Nonfocal  Psych: Normal affect   Labs     Hematology Recent Labs  Lab 09/21/21 0127 09/22/21 0212 09/23/21 0157  WBC 17.4* 18.4* 22.5*  RBC 4.88 5.15 5.26  HGB 14.7 15.3 15.8  HCT 42.7 44.9 45.8  MCV 87.5 87.2 87.1  MCH 30.1 29.7 30.0  MCHC 34.4 34.1 34.5  RDW 13.1 13.1 13.2  PLT 311 309 338     Radiology    DG Chest 2 View  Result Date: 09/22/2021 CLINICAL  DATA:  Preop EXAM: CHEST - 2 VIEW COMPARISON:  September 17, 2021 FINDINGS: The cardiomediastinal silhouette is unchanged in contour.Cardiac loop recorder. No pleural effusion. No pneumothorax. No acute pleuroparenchymal abnormality. Visualized abdomen is unremarkable. Multilevel degenerative changes of the thoracic spine. IMPRESSION: No acute cardiopulmonary abnormality. Electronically Signed   By: Valentino Saxon M.D.   On: 09/22/2021 17:16    Cardiac Studie   09/19/2021 Echo   1. No left ventricular thrombus is seen with Definity contrast. Left  ventricular ejection fraction, by estimation, is 45 to 50%. The left  ventricle has mildly decreased function. The left ventricle demonstrates  regional wall motion abnormalities (see  scoring diagram/findings for description). Left ventricular diastolic   parameters were normal. There is moderate hypokinesis of the left  ventricular, apical septal wall, inferior wall, apical segment and  anterior wall.   2. Right ventricular systolic function is normal. The right ventricular  size is normal.   3. Left atrial size was mildly dilated.   4. The mitral valve is normal in structure. No evidence of mitral valve  regurgitation. No evidence of mitral stenosis.   5. The aortic valve is normal in structure. There is mild calcification  of the aortic valve. Aortic valve regurgitation is not visualized. Mild  aortic valve sclerosis is present, with no evidence of aortic valve  stenosis.   6. The inferior vena cava is normal in size with greater than 50%  respiratory variability, suggesting right atrial pressure of 3 mmHg.   Patient Profile     75 y.o. male with a history of hypertension, hyperlipidemia, diabetes, and prior stroke in 01/2019 s/p loop recorder which showed no evidence of atrial fibrillation who presented to [facility] ED on 09/17/2021 with right arm pain and ruled in for non-STEMI.  He was transferred to [facility] on 09/18/2021 for further evaluation with cardiac catheterization.  Assessment & Plan    NSTEMI/CAD - Continue ASA and statin. - Continue heparin  -Coreg low dose with some bradycardia.  - CABG planned for Monday  2. Ischemic cardiomyopathy - TTE with EF 45-50%  - BB as above - Home lisinopril on hold,likely start post op - Euvolemic with good diuresis   3. HTN  4. HLD - Continue high intensity statin   5. DM - SSI while here  For questions or updates, please contact [place] Please consult [email or web address] for contact info under        Signed, Vickie Epley, MD  09/23/2021, 8:30 AM

## 2021-09-23 NOTE — Progress Notes (Addendum)
ANTICOAGULATION CONSULT NOTE - Follow Up Consult  Pharmacy Consult for Heparin Indication:  NSTEMI  Allergies  Allergen Reactions   Aricept [Donepezil] Nausea Only    Patient Measurements: Height: 5\' 9"  (175.3 cm) Weight: 85 kg (187 lb 6.3 oz) IBW/kg (Calculated) : 70.7 Heparin Dosing Weight: 83 kg  Vital Signs: Temp: 98 F (36.7 C) (10/23 1407) Temp Source: Oral (10/23 1407) BP: 113/59 (10/23 1407) Pulse Rate: 80 (10/23 0900)  Labs: Recent Labs    09/21/21 0127 09/22/21 0212 09/22/21 1130 09/22/21 1643 09/22/21 2013 09/23/21 0157 09/23/21 1427  HGB 14.7 15.3  --   --   --  15.8  --   HCT 42.7 44.9  --   --   --  45.8  --   PLT 311 309  --   --   --  338  --   APTT  --   --   --  63*  --   --   --   LABPROT  --   --   --  14.3  --   --   --   INR  --   --   --  1.1  --   --   --   HEPARINUNFRC 0.38 0.12*   < >  --  0.53 0.35 0.52  CREATININE  --   --   --   --   --  0.86  --    < > = values in this interval not displayed.    Estimated Creatinine Clearance: 80.2 mL/min (by C-G formula based on SCr of 0.86 mg/dL).   Medications:  Scheduled:   aspirin EC  81 mg Oral Daily   carvedilol  3.125 mg Oral BID WC   [START ON 09/24/2021] epinephrine  0-10 mcg/min Intravenous To OR   [START ON 09/24/2021] heparin-papaverine-plasmalyte irrigation   Irrigation To OR   insulin aspart  0-15 Units Subcutaneous TID WC   [START ON 09/24/2021] insulin   Intravenous To OR   [START ON 09/24/2021] [facility] Blood Cardioplegia vial (lidocaine/magnesium/mannitol 0.26g-4g-6.4g)   Intracoronary To OR   [START ON 09/24/2021] phenylephrine  30-200 mcg/min Intravenous To OR   [START ON 09/24/2021] potassium chloride  80 mEq Other To OR   rosuvastatin  40 mg Oral Daily   sodium chloride flush  3 mL Intravenous Q12H   [START ON 09/24/2021] tranexamic acid  15 mg/kg Intravenous To OR   [START ON 09/24/2021] tranexamic acid  2 mg/kg Intracatheter To OR   Infusions:   sodium chloride      [START ON 09/24/2021]  ceFAZolin (ANCEF) IV     [START ON 09/24/2021]  ceFAZolin (ANCEF) IV     [START ON 09/24/2021] dexmedetomidine     [START ON 09/24/2021] heparin 30,000 units/NS 1000 mL solution for CELLSAVER     heparin 1,600 Units/hr (09/23/21 0953)   [START ON 09/24/2021] milrinone     nitroGLYCERIN Stopped (09/18/21 2215)   [START ON 09/24/2021] nitroGLYCERIN     [START ON 09/24/2021] norepinephrine     [START ON 09/24/2021] tranexamic acid (CYKLOKAPRON) infusion (OHS)     [START ON 09/24/2021] vancomycin      Assessment: 75 yo M admitted with chest pain, s/p cath revealing multivessel CAD. Pharmacy consulted to dose IV heparin while awaiting consult for OHS. Hx GIB in the past. Plans noted for CABG on Monday 10/24.   Heparin level therapeutic at 0.35 this morning, on 1600 units/hr. CBC stable. Per RN, noted that heparin IV line was  beeping this morning (unsure for how long), and was fixed around ~0700 this morning. No signs/symptoms of bleeding noted.   Goal of Therapy:  Heparin level 0.3-0.7 units/ml Monitor platelets by anticoagulation protocol: Yes  Plan:  Continue heparin at 1600 units/hr.  Check ~8hr heparin level.  Daily CBC, heparin level.  Monitor for signs/symptoms of bleeding.   ADDENDUM 2nd heparin level therapeutic at 0.52, on 1600 units/hr. No line issues or signs/symptoms of bleeding noted per RN. Continue heparin infusion at current rate of 1600 units/hr and check heparin level tomorrow AM.    Vance Peper, PharmD PGY1 Pharmacy Resident Phone [phone] 09/23/2021 3:29 PM   Please check AMION for all [place] phone numbers After 10:00 PM, call [place] [phone]

## 2021-09-24 ENCOUNTER — Inpatient Hospital Stay (HOSPITAL_COMMUNITY): Payer: Medicare HMO

## 2021-09-24 ENCOUNTER — Inpatient Hospital Stay (HOSPITAL_COMMUNITY): Payer: Medicare HMO | Admitting: Certified Registered"

## 2021-09-24 ENCOUNTER — Encounter (HOSPITAL_COMMUNITY)
Admission: AD | Disposition: A | Discharge status: Home / Self Care | Payer: Self-pay | Source: Other Acute Inpatient Hospital | Attending: Interventional Cardiology

## 2021-09-24 DIAGNOSIS — Z951 Presence of aortocoronary bypass graft: Secondary | ICD-10-CM

## 2021-09-24 DIAGNOSIS — I2510 Atherosclerotic heart disease of native coronary artery without angina pectoris: Secondary | ICD-10-CM | POA: Diagnosis not present

## 2021-09-24 HISTORY — PX: TEE WITHOUT CARDIOVERSION: SHX5443

## 2021-09-24 HISTORY — PX: CORONARY ARTERY BYPASS GRAFT: SHX141

## 2021-09-24 HISTORY — PX: ENDOVEIN HARVEST OF GREATER SAPHENOUS VEIN: SHX5059

## 2021-09-24 LAB — CBC
HCT: 46.2 % (ref 39.0–52.0)
HCT: 39.1 % (ref 39.0–52.0)
HCT: 39.5 % (ref 39.0–52.0)
Hemoglobin: 15.8 g/dL (ref 13.0–17.0)
Hemoglobin: 13.5 g/dL (ref 13.0–17.0)
Hemoglobin: 13.4 g/dL (ref 13.0–17.0)
MCH: 29.7 pg (ref 26.0–34.0)
MCH: 30.5 pg (ref 26.0–34.0)
MCH: 30 pg (ref 26.0–34.0)
MCHC: 34.2 g/dL (ref 30.0–36.0)
MCHC: 34.5 g/dL (ref 30.0–36.0)
MCHC: 33.9 g/dL (ref 30.0–36.0)
MCV: 86.8 fL (ref 80.0–100.0)
MCV: 88.3 fL (ref 80.0–100.0)
MCV: 88.6 fL (ref 80.0–100.0)
Platelets: 319 10*3/uL (ref 150–400)
Platelets: 212 10*3/uL (ref 150–400)
Platelets: 296 10*3/uL (ref 150–400)
RBC: 5.32 MIL/uL (ref 4.22–5.81)
RBC: 4.43 MIL/uL (ref 4.22–5.81)
RBC: 4.46 MIL/uL (ref 4.22–5.81)
RDW: 13.2 % (ref 11.5–15.5)
RDW: 13.2 % (ref 11.5–15.5)
RDW: 13.4 % (ref 11.5–15.5)
WBC: 22.1 10*3/uL — ABNORMAL HIGH (ref 4.0–10.5)
WBC: 33.8 10*3/uL — ABNORMAL HIGH (ref 4.0–10.5)
WBC: 33.4 10*3/uL — ABNORMAL HIGH (ref 4.0–10.5)
nRBC: 0 % (ref 0.0–0.2)
nRBC: 0 % (ref 0.0–0.2)
nRBC: 0 % (ref 0.0–0.2)

## 2021-09-24 LAB — POCT I-STAT, CHEM 8
BUN: 21 mg/dL (ref 8–23)
BUN: 21 mg/dL (ref 8–23)
BUN: 19 mg/dL (ref 8–23)
BUN: 19 mg/dL (ref 8–23)
BUN: 21 mg/dL (ref 8–23)
Calcium, Ion: 1.25 mmol/L (ref 1.15–1.40)
Calcium, Ion: 1.19 mmol/L (ref 1.15–1.40)
Calcium, Ion: 1.01 mmol/L — ABNORMAL LOW (ref 1.15–1.40)
Calcium, Ion: 1.06 mmol/L — ABNORMAL LOW (ref 1.15–1.40)
Calcium, Ion: 1.34 mmol/L (ref 1.15–1.40)
Chloride: 100 mmol/L (ref 98–111)
Chloride: 101 mmol/L (ref 98–111)
Chloride: 99 mmol/L (ref 98–111)
Chloride: 100 mmol/L (ref 98–111)
Chloride: 101 mmol/L (ref 98–111)
Creatinine, Ser: 0.5 mg/dL — ABNORMAL LOW (ref 0.61–1.24)
Creatinine, Ser: 0.6 mg/dL — ABNORMAL LOW (ref 0.61–1.24)
Creatinine, Ser: 0.5 mg/dL — ABNORMAL LOW (ref 0.61–1.24)
Creatinine, Ser: 0.4 mg/dL — ABNORMAL LOW (ref 0.61–1.24)
Creatinine, Ser: 0.5 mg/dL — ABNORMAL LOW (ref 0.61–1.24)
Glucose, Bld: 171 mg/dL — ABNORMAL HIGH (ref 70–99)
Glucose, Bld: 154 mg/dL — ABNORMAL HIGH (ref 70–99)
Glucose, Bld: 98 mg/dL (ref 70–99)
Glucose, Bld: 160 mg/dL — ABNORMAL HIGH (ref 70–99)
Glucose, Bld: 165 mg/dL — ABNORMAL HIGH (ref 70–99)
HCT: 42 % (ref 39.0–52.0)
HCT: 40 % (ref 39.0–52.0)
HCT: 25 % — ABNORMAL LOW (ref 39.0–52.0)
HCT: 31 % — ABNORMAL LOW (ref 39.0–52.0)
HCT: 31 % — ABNORMAL LOW (ref 39.0–52.0)
Hemoglobin: 14.3 g/dL (ref 13.0–17.0)
Hemoglobin: 13.6 g/dL (ref 13.0–17.0)
Hemoglobin: 8.5 g/dL — ABNORMAL LOW (ref 13.0–17.0)
Hemoglobin: 10.5 g/dL — ABNORMAL LOW (ref 13.0–17.0)
Hemoglobin: 10.5 g/dL — ABNORMAL LOW (ref 13.0–17.0)
Potassium: 4.4 mmol/L (ref 3.5–5.1)
Potassium: 4.2 mmol/L (ref 3.5–5.1)
Potassium: 5.4 mmol/L — ABNORMAL HIGH (ref 3.5–5.1)
Potassium: 4.6 mmol/L (ref 3.5–5.1)
Potassium: 5.2 mmol/L — ABNORMAL HIGH (ref 3.5–5.1)
Sodium: 134 mmol/L — ABNORMAL LOW (ref 135–145)
Sodium: 132 mmol/L — ABNORMAL LOW (ref 135–145)
Sodium: 131 mmol/L — ABNORMAL LOW (ref 135–145)
Sodium: 132 mmol/L — ABNORMAL LOW (ref 135–145)
Sodium: 133 mmol/L — ABNORMAL LOW (ref 135–145)
TCO2: 26 mmol/L (ref 22–32)
TCO2: 24 mmol/L (ref 22–32)
TCO2: 26 mmol/L (ref 22–32)
TCO2: 25 mmol/L (ref 22–32)
TCO2: 26 mmol/L (ref 22–32)

## 2021-09-24 LAB — POCT I-STAT 7, (LYTES, BLD GAS, ICA,H+H)
Acid-Base Excess: 1 mmol/L (ref 0.0–2.0)
Acid-base deficit: 3 mmol/L — ABNORMAL HIGH (ref 0.0–2.0)
Acid-base deficit: 1 mmol/L (ref 0.0–2.0)
Acid-base deficit: 2 mmol/L (ref 0.0–2.0)
Acid-base deficit: 5 mmol/L — ABNORMAL HIGH (ref 0.0–2.0)
Acid-base deficit: 3 mmol/L — ABNORMAL HIGH (ref 0.0–2.0)
Bicarbonate: 22.1 mmol/L (ref 20.0–28.0)
Bicarbonate: 24.7 mmol/L (ref 20.0–28.0)
Bicarbonate: 23.9 mmol/L (ref 20.0–28.0)
Bicarbonate: 22.3 mmol/L (ref 20.0–28.0)
Bicarbonate: 19.7 mmol/L — ABNORMAL LOW (ref 20.0–28.0)
Bicarbonate: 21.3 mmol/L (ref 20.0–28.0)
Calcium, Ion: 0.89 mmol/L — CL (ref 1.15–1.40)
Calcium, Ion: 1.01 mmol/L — ABNORMAL LOW (ref 1.15–1.40)
Calcium, Ion: 1.35 mmol/L (ref 1.15–1.40)
Calcium, Ion: 1.25 mmol/L (ref 1.15–1.40)
Calcium, Ion: 1.11 mmol/L — ABNORMAL LOW (ref 1.15–1.40)
Calcium, Ion: 1.11 mmol/L — ABNORMAL LOW (ref 1.15–1.40)
HCT: 29 % — ABNORMAL LOW (ref 39.0–52.0)
HCT: 28 % — ABNORMAL LOW (ref 39.0–52.0)
HCT: 32 % — ABNORMAL LOW (ref 39.0–52.0)
HCT: 39 % (ref 39.0–52.0)
HCT: 38 % — ABNORMAL LOW (ref 39.0–52.0)
HCT: 37 % — ABNORMAL LOW (ref 39.0–52.0)
Hemoglobin: 9.9 g/dL — ABNORMAL LOW (ref 13.0–17.0)
Hemoglobin: 9.5 g/dL — ABNORMAL LOW (ref 13.0–17.0)
Hemoglobin: 10.9 g/dL — ABNORMAL LOW (ref 13.0–17.0)
Hemoglobin: 13.3 g/dL (ref 13.0–17.0)
Hemoglobin: 12.9 g/dL — ABNORMAL LOW (ref 13.0–17.0)
Hemoglobin: 12.6 g/dL — ABNORMAL LOW (ref 13.0–17.0)
O2 Saturation: 100 %
O2 Saturation: 100 %
O2 Saturation: 100 %
O2 Saturation: 100 %
O2 Saturation: 99 %
O2 Saturation: 99 %
Patient temperature: 36.1
Patient temperature: 36.7
Patient temperature: 37.1
Potassium: 4.4 mmol/L (ref 3.5–5.1)
Potassium: 5.3 mmol/L — ABNORMAL HIGH (ref 3.5–5.1)
Potassium: 4.6 mmol/L (ref 3.5–5.1)
Potassium: 4.3 mmol/L (ref 3.5–5.1)
Potassium: 4.2 mmol/L (ref 3.5–5.1)
Potassium: 4.2 mmol/L (ref 3.5–5.1)
Sodium: 132 mmol/L — ABNORMAL LOW (ref 135–145)
Sodium: 133 mmol/L — ABNORMAL LOW (ref 135–145)
Sodium: 133 mmol/L — ABNORMAL LOW (ref 135–145)
Sodium: 134 mmol/L — ABNORMAL LOW (ref 135–145)
Sodium: 136 mmol/L (ref 135–145)
Sodium: 135 mmol/L (ref 135–145)
TCO2: 23 mmol/L (ref 22–32)
TCO2: 26 mmol/L (ref 22–32)
TCO2: 25 mmol/L (ref 22–32)
TCO2: 23 mmol/L (ref 22–32)
TCO2: 21 mmol/L — ABNORMAL LOW (ref 22–32)
TCO2: 22 mmol/L (ref 22–32)
pCO2 arterial: 41.1 mmHg (ref 32.0–48.0)
pCO2 arterial: 34.6 mmHg (ref 32.0–48.0)
pCO2 arterial: 38.7 mmHg (ref 32.0–48.0)
pCO2 arterial: 33.6 mmHg (ref 32.0–48.0)
pCO2 arterial: 34.3 mmHg (ref 32.0–48.0)
pCO2 arterial: 35.8 mmHg (ref 32.0–48.0)
pH, Arterial: 7.339 — ABNORMAL LOW (ref 7.350–7.450)
pH, Arterial: 7.46 — ABNORMAL HIGH (ref 7.350–7.450)
pH, Arterial: 7.398 (ref 7.350–7.450)
pH, Arterial: 7.427 (ref 7.350–7.450)
pH, Arterial: 7.365 (ref 7.350–7.450)
pH, Arterial: 7.383 (ref 7.350–7.450)
pO2, Arterial: 344 mmHg — ABNORMAL HIGH (ref 83.0–108.0)
pO2, Arterial: 332 mmHg — ABNORMAL HIGH (ref 83.0–108.0)
pO2, Arterial: 358 mmHg — ABNORMAL HIGH (ref 83.0–108.0)
pO2, Arterial: 194 mmHg — ABNORMAL HIGH (ref 83.0–108.0)
pO2, Arterial: 127 mmHg — ABNORMAL HIGH (ref 83.0–108.0)
pO2, Arterial: 127 mmHg — ABNORMAL HIGH (ref 83.0–108.0)

## 2021-09-24 LAB — POCT I-STAT EG7
Acid-base deficit: 4 mmol/L — ABNORMAL HIGH (ref 0.0–2.0)
Bicarbonate: 22.8 mmol/L (ref 20.0–28.0)
Calcium, Ion: 0.98 mmol/L — ABNORMAL LOW (ref 1.15–1.40)
HCT: 29 % — ABNORMAL LOW (ref 39.0–52.0)
Hemoglobin: 9.9 g/dL — ABNORMAL LOW (ref 13.0–17.0)
O2 Saturation: 82 %
Potassium: 4.1 mmol/L (ref 3.5–5.1)
Sodium: 130 mmol/L — ABNORMAL LOW (ref 135–145)
TCO2: 24 mmol/L (ref 22–32)
pCO2, Ven: 46.9 mmHg (ref 44.0–60.0)
pH, Ven: 7.295 (ref 7.250–7.430)
pO2, Ven: 52 mmHg — ABNORMAL HIGH (ref 32.0–45.0)

## 2021-09-24 LAB — GLUCOSE, CAPILLARY
Glucose-Capillary: 200 mg/dL — ABNORMAL HIGH (ref 70–99)
Glucose-Capillary: 110 mg/dL — ABNORMAL HIGH (ref 70–99)
Glucose-Capillary: 105 mg/dL — ABNORMAL HIGH (ref 70–99)
Glucose-Capillary: 114 mg/dL — ABNORMAL HIGH (ref 70–99)
Glucose-Capillary: 112 mg/dL — ABNORMAL HIGH (ref 70–99)
Glucose-Capillary: 129 mg/dL — ABNORMAL HIGH (ref 70–99)
Glucose-Capillary: 97 mg/dL (ref 70–99)
Glucose-Capillary: 116 mg/dL — ABNORMAL HIGH (ref 70–99)
Glucose-Capillary: 99 mg/dL (ref 70–99)
Glucose-Capillary: 142 mg/dL — ABNORMAL HIGH (ref 70–99)

## 2021-09-24 LAB — MAGNESIUM: Magnesium: 3 mg/dL — ABNORMAL HIGH (ref 1.7–2.4)

## 2021-09-24 LAB — BASIC METABOLIC PANEL
Anion gap: 8 (ref 5–15)
Anion gap: 5 (ref 5–15)
BUN: 27 mg/dL — ABNORMAL HIGH (ref 8–23)
BUN: 18 mg/dL (ref 8–23)
CO2: 22 mmol/L (ref 22–32)
CO2: 22 mmol/L (ref 22–32)
Calcium: 8.7 mg/dL — ABNORMAL LOW (ref 8.9–10.3)
Calcium: 7.4 mg/dL — ABNORMAL LOW (ref 8.9–10.3)
Chloride: 99 mmol/L (ref 98–111)
Chloride: 105 mmol/L (ref 98–111)
Creatinine, Ser: 0.82 mg/dL (ref 0.61–1.24)
Creatinine, Ser: 0.65 mg/dL (ref 0.61–1.24)
GFR, Estimated: >60 mL/min (ref >60)
GFR, Estimated: >60 mL/min (ref >60)
Glucose, Bld: 241 mg/dL — ABNORMAL HIGH (ref 70–99)
Glucose, Bld: 119 mg/dL — ABNORMAL HIGH (ref 70–99)
Potassium: 4.6 mmol/L (ref 3.5–5.1)
Potassium: 5 mmol/L (ref 3.5–5.1)
Sodium: 129 mmol/L — ABNORMAL LOW (ref 135–145)
Sodium: 132 mmol/L — ABNORMAL LOW (ref 135–145)

## 2021-09-24 LAB — PROTIME-INR
INR: 1.4 — ABNORMAL HIGH (ref 0.8–1.2)
Prothrombin Time: 16.7 seconds — ABNORMAL HIGH (ref 11.4–15.2)

## 2021-09-24 LAB — ECHO INTRAOPERATIVE TEE
Height: 69 in
S' Lateral: 3 cm
Weight: 2832 oz

## 2021-09-24 LAB — PREPARE RBC (CROSSMATCH)

## 2021-09-24 LAB — HEMOGLOBIN AND HEMATOCRIT, BLOOD
HCT: 30.3 % — ABNORMAL LOW (ref 39.0–52.0)
Hemoglobin: 10.6 g/dL — ABNORMAL LOW (ref 13.0–17.0)

## 2021-09-24 LAB — PLATELET COUNT: Platelets: 233 10*3/uL (ref 150–400)

## 2021-09-24 LAB — APTT: aPTT: 27 seconds (ref 24–36)

## 2021-09-24 LAB — HEPARIN LEVEL (UNFRACTIONATED): Heparin Unfractionated: 0.6 IU/mL (ref 0.30–0.70)

## 2021-09-24 SURGERY — CORONARY ARTERY BYPASS GRAFTING (CABG)
Anesthesia: General | Site: Chest | Laterality: Right

## 2021-09-24 MED ORDER — ASPIRIN EC 325 MG PO TBEC: 325.0000 mg | DELAYED_RELEASE_TABLET | Freq: Every day | ORAL | Status: DC

## 2021-09-24 MED ORDER — CEFAZOLIN SODIUM-DEXTROSE 2-4 GM/100ML-% IV SOLN
2.0000 g | Freq: Three times a day (TID) | INTRAVENOUS | Status: AC
  Administered 2021-09-24 – 2021-09-26 (×6): 2 g via INTRAVENOUS
  Filled 2021-09-24 (×6): qty 100

## 2021-09-24 MED ORDER — PROPOFOL 10 MG/ML IV BOLUS
INTRAVENOUS | Status: DC | PRN
  Administered 2021-09-24: 50 mg via INTRAVENOUS

## 2021-09-24 MED ORDER — ACETAMINOPHEN 500 MG PO TABS
1000.0000 mg | ORAL_TABLET | Freq: Four times a day (QID) | ORAL | Status: DC
Start: 2021-09-25 — End: 2021-09-28
  Administered 2021-09-25 – 2021-09-28 (×14): 1000 mg via ORAL
  Filled 2021-09-24 (×13): qty 2

## 2021-09-24 MED ORDER — MIDAZOLAM HCL 2 MG/2ML IJ SOLN: 2.0000 mg | INTRAMUSCULAR | Status: DC | PRN

## 2021-09-24 MED ORDER — SODIUM CHLORIDE (PF) 0.9 % IJ SOLN
OROMUCOSAL | Status: DC | PRN
  Administered 2021-09-24 (×2): 4 mL via TOPICAL

## 2021-09-24 MED ORDER — ORAL CARE MOUTH RINSE
15.0000 mL | OROMUCOSAL | Status: DC
  Administered 2021-09-24: 15 mL via OROMUCOSAL

## 2021-09-24 MED ORDER — NICARDIPINE HCL IN NACL 20-0.86 MG/200ML-% IV SOLN: 0.0000 mg/h | INTRAVENOUS | Status: DC

## 2021-09-24 MED ORDER — CHLORHEXIDINE GLUCONATE 0.12 % MT SOLN
OROMUCOSAL | Status: AC
  Administered 2021-09-24: 15 mL via OROMUCOSAL
  Filled 2021-09-24: qty 15

## 2021-09-24 MED ORDER — SODIUM CHLORIDE (PF) 0.9 % IJ SOLN
INTRAMUSCULAR | Status: AC
  Filled 2021-09-24: qty 10

## 2021-09-24 MED ORDER — PHENYLEPHRINE HCL-NACL 20-0.9 MG/250ML-% IV SOLN
0.0000 ug/min | INTRAVENOUS | Status: DC
  Administered 2021-09-25: 30 ug/min via INTRAVENOUS
  Filled 2021-09-24: qty 250

## 2021-09-24 MED ORDER — SODIUM CHLORIDE 0.9 % IV BOLUS: 250.0000 mL | INTRAVENOUS | Status: DC | PRN

## 2021-09-24 MED ORDER — ACETAMINOPHEN 160 MG/5ML PO SOLN: 1000.0000 mg | Freq: Four times a day (QID) | ORAL | Status: DC

## 2021-09-24 MED ORDER — ALBUMIN HUMAN 25 % IV SOLN
12.5000 g | INTRAVENOUS | Status: DC | PRN
  Administered 2021-09-24 (×3): 12.5 g via INTRAVENOUS
  Filled 2021-09-24: qty 50

## 2021-09-24 MED ORDER — ROCURONIUM BROMIDE 10 MG/ML (PF) SYRINGE
PREFILLED_SYRINGE | INTRAVENOUS | Status: DC | PRN
  Administered 2021-09-24: 100 mg via INTRAVENOUS
  Administered 2021-09-24 (×2): 50 mg via INTRAVENOUS

## 2021-09-24 MED ORDER — LIDOCAINE 2% (20 MG/ML) 5 ML SYRINGE
INTRAMUSCULAR | Status: AC
  Filled 2021-09-24: qty 5

## 2021-09-24 MED ORDER — MIDAZOLAM HCL 5 MG/5ML IJ SOLN: INTRAMUSCULAR | Status: DC | PRN

## 2021-09-24 MED ORDER — METOPROLOL TARTRATE 5 MG/5ML IV SOLN: 2.5000 mg | INTRAVENOUS | Status: DC | PRN

## 2021-09-24 MED ORDER — MAGNESIUM SULFATE 4 GM/100ML IV SOLN
4.0000 g | Freq: Once | INTRAVENOUS | Status: AC
  Administered 2021-09-24: 4 g via INTRAVENOUS
  Filled 2021-09-24: qty 100

## 2021-09-24 MED ORDER — SODIUM CHLORIDE 0.9% FLUSH
3.0000 mL | Freq: Two times a day (BID) | INTRAVENOUS | Status: DC
  Administered 2021-09-25 – 2021-09-26 (×4): 3 mL via INTRAVENOUS
  Administered 2021-09-27: 10 mL via INTRAVENOUS
  Administered 2021-09-27: 3 mL via INTRAVENOUS

## 2021-09-24 MED ORDER — HEPARIN SODIUM (PORCINE) 1000 UNIT/ML IJ SOLN
INTRAMUSCULAR | Status: DC | PRN
  Administered 2021-09-24: 2000 [IU] via INTRAVENOUS
  Administered 2021-09-24: 23000 [IU] via INTRAVENOUS

## 2021-09-24 MED ORDER — PROPOFOL 10 MG/ML IV BOLUS
INTRAVENOUS | Status: AC
  Filled 2021-09-24: qty 20

## 2021-09-24 MED ORDER — DEXMEDETOMIDINE HCL IN NACL 400 MCG/100ML IV SOLN: 0.0000 ug/kg/h | INTRAVENOUS | Status: DC

## 2021-09-24 MED ORDER — FAMOTIDINE IN NACL 20-0.9 MG/50ML-% IV SOLN
20.0000 mg | Freq: Two times a day (BID) | INTRAVENOUS | Status: DC
  Administered 2021-09-24: 20 mg via INTRAVENOUS
  Filled 2021-09-24: qty 50

## 2021-09-24 MED ORDER — DOBUTAMINE INFUSION FOR EP/ECHO/NUC (1000 MCG/ML)
0.0000 ug/kg/min | INTRAVENOUS | Status: DC
Start: 2021-09-24 — End: 2021-09-25

## 2021-09-24 MED ORDER — PROTAMINE SULFATE 10 MG/ML IV SOLN
INTRAVENOUS | Status: DC | PRN
  Administered 2021-09-24: 250 mg via INTRAVENOUS

## 2021-09-24 MED ORDER — PHENYLEPHRINE 40 MCG/ML (10ML) SYRINGE FOR IV PUSH (FOR BLOOD PRESSURE SUPPORT)
PREFILLED_SYRINGE | INTRAVENOUS | Status: DC | PRN
  Administered 2021-09-24: 120 ug via INTRAVENOUS

## 2021-09-24 MED ORDER — SODIUM CHLORIDE 0.9% FLUSH: 10.0000 mL | INTRAVENOUS | Status: DC | PRN

## 2021-09-24 MED ORDER — CHLORHEXIDINE GLUCONATE 0.12 % MT SOLN
15.0000 mL | OROMUCOSAL | Status: AC
  Administered 2021-09-24: 15 mL via OROMUCOSAL

## 2021-09-24 MED ORDER — VANCOMYCIN HCL IN DEXTROSE 1-5 GM/200ML-% IV SOLN
1000.0000 mg | Freq: Once | INTRAVENOUS | Status: AC
  Administered 2021-09-24: 1000 mg via INTRAVENOUS
  Filled 2021-09-24: qty 200

## 2021-09-24 MED ORDER — ASPIRIN 81 MG PO CHEW: 324.0000 mg | CHEWABLE_TABLET | Freq: Every day | ORAL | Status: DC

## 2021-09-24 MED ORDER — BISACODYL 10 MG RE SUPP: 10.0000 mg | Freq: Every day | RECTAL | Status: DC

## 2021-09-24 MED ORDER — ROCURONIUM BROMIDE 10 MG/ML (PF) SYRINGE
PREFILLED_SYRINGE | INTRAVENOUS | Status: AC
  Filled 2021-09-24: qty 10

## 2021-09-24 MED ORDER — CHLORHEXIDINE GLUCONATE 0.12% ORAL RINSE (MEDLINE KIT): 15.0000 mL | Freq: Two times a day (BID) | OROMUCOSAL | Status: DC

## 2021-09-24 MED ORDER — PHENYLEPHRINE HCL-NACL 20-0.9 MG/250ML-% IV SOLN: 30.0000 ug/min | INTRAVENOUS | Status: DC

## 2021-09-24 MED ORDER — PHENYLEPHRINE 40 MCG/ML (10ML) SYRINGE FOR IV PUSH (FOR BLOOD PRESSURE SUPPORT)
PREFILLED_SYRINGE | INTRAVENOUS | Status: AC
  Filled 2021-09-24: qty 10

## 2021-09-24 MED ORDER — METOPROLOL TARTRATE 12.5 MG HALF TABLET
12.5000 mg | ORAL_TABLET | Freq: Two times a day (BID) | ORAL | Status: DC
  Administered 2021-09-25 – 2021-09-28 (×7): 12.5 mg via ORAL
  Filled 2021-09-24 (×7): qty 1

## 2021-09-24 MED ORDER — SODIUM CHLORIDE 0.9% FLUSH
10.0000 mL | Freq: Two times a day (BID) | INTRAVENOUS | Status: DC
  Administered 2021-09-24 – 2021-09-26 (×4): 10 mL

## 2021-09-24 MED ORDER — INSULIN REGULAR(HUMAN) IN NACL 100-0.9 UT/100ML-% IV SOLN
INTRAVENOUS | Status: DC
  Filled 2021-09-24: qty 100

## 2021-09-24 MED ORDER — ONDANSETRON HCL 4 MG/2ML IJ SOLN
4.0000 mg | Freq: Four times a day (QID) | INTRAMUSCULAR | Status: DC | PRN
Start: 2021-09-24 — End: 2021-09-28
  Administered 2021-09-25 (×2): 4 mg via INTRAVENOUS
  Filled 2021-09-24 (×2): qty 2

## 2021-09-24 MED ORDER — EPHEDRINE 5 MG/ML INJ
INTRAVENOUS | Status: AC
  Filled 2021-09-24: qty 5

## 2021-09-24 MED ORDER — MIDAZOLAM HCL 2 MG/2ML IJ SOLN
INTRAMUSCULAR | Status: AC
  Filled 2021-09-24: qty 2

## 2021-09-24 MED ORDER — ORAL CARE MOUTH RINSE
15.0000 mL | Freq: Two times a day (BID) | OROMUCOSAL | Status: DC
  Administered 2021-09-24 – 2021-09-25 (×2): 15 mL via OROMUCOSAL

## 2021-09-24 MED ORDER — MIDAZOLAM HCL (PF) 10 MG/2ML IJ SOLN
INTRAMUSCULAR | Status: AC
  Filled 2021-09-24: qty 2

## 2021-09-24 MED ORDER — DEXTROSE 50 % IV SOLN: 0.0000 mL | INTRAVENOUS | Status: DC | PRN

## 2021-09-24 MED ORDER — PROTAMINE SULFATE 10 MG/ML IV SOLN
INTRAVENOUS | Status: AC
  Filled 2021-09-24: qty 25

## 2021-09-24 MED ORDER — LACTATED RINGERS IV SOLN: INTRAVENOUS | Status: DC

## 2021-09-24 MED ORDER — EPHEDRINE SULFATE-NACL 50-0.9 MG/10ML-% IV SOSY
PREFILLED_SYRINGE | INTRAVENOUS | Status: DC | PRN
  Administered 2021-09-24: 5 mg via INTRAVENOUS

## 2021-09-24 MED ORDER — PANTOPRAZOLE SODIUM 40 MG PO TBEC
40.0000 mg | DELAYED_RELEASE_TABLET | Freq: Every day | ORAL | Status: DC
  Administered 2021-09-26 – 2021-09-28 (×3): 40 mg via ORAL
  Filled 2021-09-24 (×3): qty 1

## 2021-09-24 MED ORDER — FENTANYL CITRATE (PF) 250 MCG/5ML IJ SOLN
INTRAMUSCULAR | Status: AC
  Filled 2021-09-24: qty 20

## 2021-09-24 MED ORDER — METOPROLOL TARTRATE 25 MG/10 ML ORAL SUSPENSION: 12.5000 mg | Freq: Two times a day (BID) | ORAL | Status: DC

## 2021-09-24 MED ORDER — PLASMA-LYTE A IV SOLN
INTRAVENOUS | Status: DC | PRN
  Administered 2021-09-24: 1000 mL via INTRAVASCULAR

## 2021-09-24 MED ORDER — MIDAZOLAM HCL 2 MG/2ML IJ SOLN
INTRAMUSCULAR | Status: DC | PRN
  Administered 2021-09-24: 1 mg via INTRAVENOUS
  Administered 2021-09-24: 3 mg via INTRAVENOUS
  Administered 2021-09-24: 1 mg via INTRAVENOUS

## 2021-09-24 MED ORDER — FENTANYL CITRATE (PF) 250 MCG/5ML IJ SOLN
INTRAMUSCULAR | Status: DC | PRN
  Administered 2021-09-24: 250 ug via INTRAVENOUS
  Administered 2021-09-24: 100 ug via INTRAVENOUS
  Administered 2021-09-24: 150 ug via INTRAVENOUS
  Administered 2021-09-24: 200 ug via INTRAVENOUS
  Administered 2021-09-24: 50 ug via INTRAVENOUS
  Administered 2021-09-24: 150 ug via INTRAVENOUS

## 2021-09-24 MED ORDER — ACETAMINOPHEN 650 MG RE SUPP
650.0000 mg | Freq: Once | RECTAL | Status: AC
  Administered 2021-09-24: 650 mg via RECTAL

## 2021-09-24 MED ORDER — LIDOCAINE HCL (CARDIAC) PF 100 MG/5ML IV SOSY: PREFILLED_SYRINGE | INTRAVENOUS | Status: DC | PRN

## 2021-09-24 MED ORDER — OXYCODONE HCL 5 MG PO TABS
5.0000 mg | ORAL_TABLET | ORAL | Status: DC | PRN
  Administered 2021-09-25 (×2): 5 mg via ORAL
  Filled 2021-09-24 (×2): qty 1

## 2021-09-24 MED ORDER — SODIUM CHLORIDE 0.9 % IV SOLN: INTRAVENOUS | Status: DC

## 2021-09-24 MED ORDER — DOCUSATE SODIUM 100 MG PO CAPS
200.0000 mg | ORAL_CAPSULE | Freq: Every day | ORAL | Status: DC
  Administered 2021-09-25 – 2021-09-28 (×4): 200 mg via ORAL
  Filled 2021-09-24 (×4): qty 2

## 2021-09-24 MED ORDER — 0.9 % SODIUM CHLORIDE (POUR BTL) OPTIME
TOPICAL | Status: DC | PRN
  Administered 2021-09-24: 5000 mL

## 2021-09-24 MED ORDER — HEPARIN SODIUM (PORCINE) 1000 UNIT/ML IJ SOLN
INTRAMUSCULAR | Status: AC
  Filled 2021-09-24: qty 1

## 2021-09-24 MED ORDER — TRAMADOL HCL 50 MG PO TABS
50.0000 mg | ORAL_TABLET | ORAL | Status: DC | PRN
  Administered 2021-09-25 – 2021-09-26 (×3): 50 mg via ORAL
  Filled 2021-09-24 (×4): qty 1

## 2021-09-24 MED ORDER — LACTATED RINGERS IV SOLN: INTRAVENOUS | Status: DC | PRN

## 2021-09-24 MED ORDER — NITROGLYCERIN IN D5W 200-5 MCG/ML-% IV SOLN: 0.0000 ug/min | INTRAVENOUS | Status: DC

## 2021-09-24 MED ORDER — LACTATED RINGERS IV SOLN
500.0000 mL | Freq: Once | INTRAVENOUS | Status: AC | PRN
  Administered 2021-09-24: 500 mL via INTRAVENOUS

## 2021-09-24 MED ORDER — ALBUMIN HUMAN 5 % IV SOLN
250.0000 mL | INTRAVENOUS | Status: DC | PRN
  Administered 2021-09-24: 12.5 g via INTRAVENOUS

## 2021-09-24 MED ORDER — CEFAZOLIN SODIUM-DEXTROSE 2-3 GM-%(50ML) IV SOLR: INTRAVENOUS | Status: DC | PRN

## 2021-09-24 MED ORDER — SODIUM CHLORIDE 0.9% FLUSH: 3.0000 mL | INTRAVENOUS | Status: DC | PRN

## 2021-09-24 MED ORDER — SODIUM CHLORIDE 0.45 % IV SOLN: INTRAVENOUS | Status: DC | PRN

## 2021-09-24 MED ORDER — SODIUM CHLORIDE 0.9 % IV SOLN
250.0000 mL | INTRAVENOUS | Status: DC
Start: 2021-09-25 — End: 2021-09-28

## 2021-09-24 MED ORDER — MORPHINE SULFATE (PF) 2 MG/ML IV SOLN
1.0000 mg | INTRAVENOUS | Status: DC | PRN
  Administered 2021-09-24: 1 mg via INTRAVENOUS
  Administered 2021-09-24 – 2021-09-25 (×2): 2 mg via INTRAVENOUS
  Filled 2021-09-24 (×3): qty 1

## 2021-09-24 MED ORDER — BISACODYL 5 MG PO TBEC
10.0000 mg | DELAYED_RELEASE_TABLET | Freq: Every day | ORAL | Status: DC
  Administered 2021-09-25 – 2021-09-28 (×4): 10 mg via ORAL
  Filled 2021-09-24 (×4): qty 2

## 2021-09-24 MED ORDER — TRANEXAMIC ACID 1000 MG/10ML IV SOLN: INTRAVENOUS | Status: DC | PRN

## 2021-09-24 MED ORDER — LIDOCAINE 2% (20 MG/ML) 5 ML SYRINGE
INTRAMUSCULAR | Status: DC | PRN
  Administered 2021-09-24: 100 mg via INTRAVENOUS

## 2021-09-24 MED ORDER — ROCURONIUM BROMIDE 100 MG/10ML IV SOLN: INTRAVENOUS | Status: DC | PRN

## 2021-09-24 MED ORDER — CHLORHEXIDINE GLUCONATE CLOTH 2 % EX PADS
6.0000 | MEDICATED_PAD | Freq: Every day | CUTANEOUS | Status: DC
Start: 2021-09-24 — End: 2021-09-28
  Administered 2021-09-24 – 2021-09-27 (×4): 6 via TOPICAL

## 2021-09-24 MED ORDER — NITROGLYCERIN 0.2 MG/ML ON CALL CATH LAB
INTRAVENOUS | Status: DC | PRN
  Administered 2021-09-24: 20 ug via INTRAVENOUS

## 2021-09-24 MED ORDER — POTASSIUM CHLORIDE 10 MEQ/50ML IV SOLN
10.0000 meq | INTRAVENOUS | Status: AC
Start: 2021-09-24 — End: 2021-09-24

## 2021-09-24 MED ORDER — ACETAMINOPHEN 160 MG/5ML PO SOLN: 650.0000 mg | Freq: Once | ORAL | Status: AC

## 2021-09-24 SURGICAL SUPPLY — 82 items
BAG DECANTER FOR FLEXI CONT (MISCELLANEOUS) ×6 IMPLANT
BLADE CLIPPER SURG (BLADE) ×6 IMPLANT
BLADE STERNUM SYSTEM 6 (BLADE) ×6 IMPLANT
BLADE SURG 11 STRL SS (BLADE) ×6 IMPLANT
BNDG ELASTIC 4X5.8 VLCR STR LF (GAUZE/BANDAGES/DRESSINGS) ×6 IMPLANT
BNDG ELASTIC 6X10 VLCR STRL LF (GAUZE/BANDAGES/DRESSINGS) ×6 IMPLANT
BNDG ELASTIC 6X5.8 VLCR STR LF (GAUZE/BANDAGES/DRESSINGS) ×6 IMPLANT
BNDG GAUZE ELAST 4 BULKY (GAUZE/BANDAGES/DRESSINGS) ×6 IMPLANT
CABLE SURGICAL S-101-97-12 (CABLE) IMPLANT
CANISTER SUCT 3000ML PPV (MISCELLANEOUS) ×6 IMPLANT
CANNULA MC2 2 STG 29/37 NON-V (CANNULA) ×4 IMPLANT
CANNULA MC2 TWO STAGE (CANNULA) ×2
CANNULA NON VENT 20FR 12 (CANNULA) ×6 IMPLANT
CATH ROBINSON RED A/P 18FR (CATHETERS) ×12 IMPLANT
CLIP RETRACTION 3.0MM CORONARY (MISCELLANEOUS) ×6 IMPLANT
CLIP VESOCCLUDE MED 24/CT (CLIP) IMPLANT
CLIP VESOCCLUDE SM WIDE 24/CT (CLIP) IMPLANT
CONN ST 1/2X1/2  BEN (MISCELLANEOUS)
CONN ST 1/2X1/2 BEN (MISCELLANEOUS) IMPLANT
CONNECTOR BLAKE 2:1 CARIO BLK (MISCELLANEOUS) ×6 IMPLANT
CONTAINER PROTECT SURGISLUSH (MISCELLANEOUS) ×12 IMPLANT
DERMABOND ADVANCED (GAUZE/BANDAGES/DRESSINGS) ×2
DERMABOND ADVANCED .7 DNX12 (GAUZE/BANDAGES/DRESSINGS) ×4 IMPLANT
DRAIN CHANNEL 19F RND (DRAIN) ×12 IMPLANT
DRAIN CONNECTOR BLAKE 1:1 (MISCELLANEOUS) ×6 IMPLANT
DRAPE CARDIOVASCULAR INCISE (DRAPES) ×2
DRAPE INCISE IOBAN 66X45 STRL (DRAPES) IMPLANT
DRAPE SRG 135X102X78XABS (DRAPES) ×4 IMPLANT
DRAPE WARM FLUID 44X44 (DRAPES) ×6 IMPLANT
DRSG AQUACEL AG ADV 3.5X10 (GAUZE/BANDAGES/DRESSINGS) ×6 IMPLANT
DRSG COVADERM 4X14 (GAUZE/BANDAGES/DRESSINGS) ×6 IMPLANT
ELECT BLADE 4.0 EZ CLEAN MEGAD (MISCELLANEOUS) ×6
ELECT REM PT RETURN 9FT ADLT (ELECTROSURGICAL) ×12
ELECTRODE BLDE 4.0 EZ CLN MEGD (MISCELLANEOUS) ×4 IMPLANT
ELECTRODE REM PT RTRN 9FT ADLT (ELECTROSURGICAL) ×8 IMPLANT
FELT TEFLON 1X6 (MISCELLANEOUS) ×6 IMPLANT
GAUZE SPONGE 4X4 12PLY STRL (GAUZE/BANDAGES/DRESSINGS) ×12 IMPLANT
GLOVE SURG ENC MOIS LTX SZ7 (GLOVE) ×12 IMPLANT
GLOVE SURG ENC TEXT LTX SZ7.5 (GLOVE) ×12 IMPLANT
GOWN STRL REUS W/ TWL LRG LVL3 (GOWN DISPOSABLE) ×16 IMPLANT
GOWN STRL REUS W/ TWL XL LVL3 (GOWN DISPOSABLE) ×8 IMPLANT
GOWN STRL REUS W/TWL LRG LVL3 (GOWN DISPOSABLE) ×8
GOWN STRL REUS W/TWL XL LVL3 (GOWN DISPOSABLE) ×4
HEMOSTAT POWDER SURGIFOAM 1G (HEMOSTASIS) ×12 IMPLANT
INSERT SUTURE HOLDER (MISCELLANEOUS) ×6 IMPLANT
KIT BASIN OR (CUSTOM PROCEDURE TRAY) ×6 IMPLANT
KIT SUCTION CATH 14FR (SUCTIONS) ×6 IMPLANT
KIT TURNOVER KIT B (KITS) ×6 IMPLANT
KIT VASOVIEW HEMOPRO 2 VH 4000 (KITS) ×6 IMPLANT
LEAD PACING MYOCARDI (MISCELLANEOUS) ×6 IMPLANT
MARKER GRAFT CORONARY BYPASS (MISCELLANEOUS) ×18 IMPLANT
NS IRRIG 1000ML POUR BTL (IV SOLUTION) ×30 IMPLANT
PACK ACCESSORY CANNULA KIT (KITS) ×6 IMPLANT
PACK E OPEN HEART (SUTURE) ×6 IMPLANT
PACK OPEN HEART (CUSTOM PROCEDURE TRAY) ×6 IMPLANT
PAD ARMBOARD 7.5X6 YLW CONV (MISCELLANEOUS) ×12 IMPLANT
PAD ELECT DEFIB RADIOL ZOLL (MISCELLANEOUS) ×6 IMPLANT
PENCIL BUTTON HOLSTER BLD 10FT (ELECTRODE) ×6 IMPLANT
POSITIONER HEAD DONUT 9IN (MISCELLANEOUS) ×6 IMPLANT
PUNCH AORTIC ROTATE 4.0MM (MISCELLANEOUS) ×6 IMPLANT
SET MPS 3-ND DEL (MISCELLANEOUS) ×6 IMPLANT
SUPPORT HEART JANKE-BARRON (MISCELLANEOUS) ×6 IMPLANT
SUT BONE WAX W31G (SUTURE) ×6 IMPLANT
SUT ETHIBOND X763 2 0 SH 1 (SUTURE) ×12 IMPLANT
SUT MNCRL AB 3-0 PS2 18 (SUTURE) ×12 IMPLANT
SUT MNCRL AB 4-0 PS2 18 (SUTURE) ×6 IMPLANT
SUT PDS AB 1 CTX 36 (SUTURE) ×12 IMPLANT
SUT PROLENE 4 0 SH DA (SUTURE) ×6 IMPLANT
SUT PROLENE 5 0 C 1 36 (SUTURE) ×18 IMPLANT
SUT PROLENE 7 0 BV1 MDA (SUTURE) ×6 IMPLANT
SUT STEEL 6MS V (SUTURE) ×12 IMPLANT
SUT VIC AB 2-0 CT1 27 (SUTURE) ×2
SUT VIC AB 2-0 CT1 TAPERPNT 27 (SUTURE) ×4 IMPLANT
SYSTEM SAHARA CHEST DRAIN ATS (WOUND CARE) ×6 IMPLANT
TAPE CLOTH SURG 4X10 WHT LF (GAUZE/BANDAGES/DRESSINGS) ×6 IMPLANT
TAPE PAPER 2X10 WHT MICROPORE (GAUZE/BANDAGES/DRESSINGS) ×6 IMPLANT
TOWEL GREEN STERILE (TOWEL DISPOSABLE) ×6 IMPLANT
TOWEL GREEN STERILE FF (TOWEL DISPOSABLE) ×6 IMPLANT
TRAY FOLEY SLVR 16FR TEMP STAT (SET/KITS/TRAYS/PACK) ×6 IMPLANT
TUBING LAP HI FLOW INSUFFLATIO (TUBING) ×6 IMPLANT
UNDERPAD 30X36 HEAVY ABSORB (UNDERPADS AND DIAPERS) ×6 IMPLANT
WATER STERILE IRR 1000ML POUR (IV SOLUTION) ×12 IMPLANT

## 2021-09-24 NOTE — Procedures (Signed)
Extubation Procedure Note  Patient Details:   Name: Jon Nelson DOB: 10-30-1946 MRN: [id number]   Airway Documentation:  Airway 8 mm (Active)  Secured at (cm) 24 cm 09/24/21 1349  Measured From Lips 09/24/21 1349  Secured Location Right 09/24/21 1349  Secured By Pink Tape 09/24/21 1349  Tube Holder Repositioned Yes 09/24/21 1349  Site Condition Dry 09/24/21 1349   Vent end date: (not recorded) Vent end time: (not recorded)   Evaluation  O2 sats: stable throughout Complications: No apparent complications Patient did tolerate procedure well. Bilateral Breath Sounds: Clear   Yes Incentive spirometer instructed 748mi 4L min [place] NIF-35 FVC-860 ml  [place] Standard 09/24/2021, 5:51 PM

## 2021-09-24 NOTE — Anesthesia Procedure Notes (Signed)
Central Venous Catheter Insertion Performed by: Catalina Gravel, MD, anesthesiologist Start/End10/24/2022 8:42 AM, 09/24/2021 8:52 AM Patient location: Pre-op. Preanesthetic checklist: patient identified, IV checked, site marked, risks and benefits discussed, surgical consent, monitors and equipment checked, pre-op evaluation, timeout performed and anesthesia consent Position: Trendelenburg Lidocaine 1% used for infiltration and patient sedated Hand hygiene performed , maximum sterile barriers used  and Seldinger technique used Catheter size: 8.5 Fr Central line was placed.Sheath introducer Procedure performed using ultrasound guided technique. Ultrasound Notes:anatomy identified, needle tip was noted to be adjacent to the nerve/plexus identified, no ultrasound evidence of intravascular and/or intraneural injection and image(s) printed for medical record Attempts: 1 Following insertion, line sutured, dressing applied and Biopatch. Post procedure assessment: free fluid flow, blood return through all ports and no air  Patient tolerated the procedure well with no immediate complications.

## 2021-09-24 NOTE — Transfer of Care (Signed)
Immediate Anesthesia Transfer of Care Note  Patient: Advice worker  Procedure(s) Performed: CORONARY ARTERY BYPASS GRAFTING (CABG) TIMES THREE , ON PUMP, USING LEFT INTERNAL MAMMARY ARTERY AND ENDOSCOPICALLY HARVESTED RIGHT GREATER SAPHENOUS VEIN (Chest) TRANSESOPHAGEAL ECHOCARDIOGRAM (TEE) APPLICATION OF CELL SAVER ENDOVEIN HARVEST OF GREATER SAPHENOUS VEIN (Right)  Patient Location: PACU and ICU  Anesthesia Type:General  Level of Consciousness: sedated and Patient remains intubated per anesthesia plan  Airway & Oxygen Therapy: Patient remains intubated per anesthesia plan and Patient placed on Ventilator (see vital sign flow sheet for setting)  Post-op Assessment: Report given to RN and Post -op Vital signs reviewed and stable  Post vital signs: Reviewed and stable  Last Vitals:  Vitals Value Taken Time  BP    Temp    Pulse    Resp    SpO2      Last Pain:  Vitals:   09/24/21 0759  TempSrc: Oral  PainSc:       Patients Stated Pain Goal: 0 (11/91/47 8295)  Complications: No notable events documented.

## 2021-09-24 NOTE — Anesthesia Procedure Notes (Signed)
Procedure Name: Intubation Date/Time: 09/24/2021 9:05 AM Performed by: Lavell Luster, CRNA Pre-anesthesia Checklist: Patient identified, Emergency Drugs available, Suction available, Patient being monitored and Timeout performed Patient Re-evaluated:Patient Re-evaluated prior to induction Oxygen Delivery Method: Circle system utilized Preoxygenation: Pre-oxygenation with 100% oxygen Induction Type: IV induction Ventilation: Mask ventilation without difficulty Laryngoscope Size: Mac and 4 Grade View: Grade I Tube type: Oral Tube size: 8.0 mm Number of attempts: 1 Airway Equipment and Method: Stylet Placement Confirmation: ETT inserted through vocal cords under direct vision, positive ETCO2 and breath sounds checked- equal and bilateral Secured at: 24 cm Tube secured with: Tape Dental Injury: Teeth and Oropharynx as per pre-operative assessment  Comments: Poor dentition noted, bottom teeth moving during intubation.  Teeth intact after ETT secured.  Henderson Cloud, CRNA

## 2021-09-24 NOTE — Anesthesia Procedure Notes (Signed)
Arterial Line Insertion Start/End10/24/2022 8:00 AM, 09/24/2021 8:10 AM Performed by: Lavell Luster, CRNA, CRNA  Preanesthetic checklist: patient identified, IV checked, site marked, risks and benefits discussed, surgical consent, monitors and equipment checked, pre-op evaluation and timeout performed Lidocaine 1% used for infiltration Left, radial was placed Catheter size: 20 G Hand hygiene performed , maximum sterile barriers used  and Seldinger technique used Allen's test indicative of satisfactory collateral circulation Attempts: 1 Procedure performed without using ultrasound guided technique. Ultrasound Notes:anatomy identified Following insertion, dressing applied and Biopatch. Post procedure assessment: normal  Patient tolerated the procedure well with no immediate complications.

## 2021-09-24 NOTE — Brief Op Note (Addendum)
09/18/2021 - 09/24/2021  11:03 AM  PATIENT:  Jon Nelson  75 y.o. male  PRE-OPERATIVE DIAGNOSIS:  Coronary Artery Disease  POST-OPERATIVE DIAGNOSIS:  Coronary Artery Disease  PROCEDURE: TRANSESOPHAGEAL ECHOCARDIOGRAM (TEE), CORONARY ARTERY BYPASS GRAFTING (CABG) TIMES 3  (LIMA to LAD, SVG to OM, SVG to PDA), ON PUMP, USING LEFT INTERNAL MAMMARY ARTERY AND ENDOSCOPICALLY HARVESTED RIGHT GREATER SAPHENOUS VEIN , and APPLICATION OF CELL SAVER RIGHT EVH HARVEST TIME: 34 minutes; RIGHT EVH PREP TIME: 15 minutes  SURGEON:  Surgeon(s) and Role:    Lightfoot, Lucile Crater, MD - Primary  PHYSICIAN ASSISTANT: Lars Pinks PA-C  ASSISTANTS: Gillermo Murdoch RNFA   ANESTHESIA:   general  EBL:  Per perfusion, anesthesia record  DRAINS:  Chest tubes placed in the mediastinal and pleural spaces    COUNTS CORRECT:  YES  DICTATION: .Dragon Dictation  PLAN OF CARE: Admit to inpatient   PATIENT DISPOSITION:  ICU - intubated and hemodynamically stable.   Delay start of Pharmacological VTE agent (>24hrs) due to surgical blood loss or risk of bleeding: yes  BASELINE WEIGHT: 77.9 kg

## 2021-09-24 NOTE — Progress Notes (Signed)
     [address]       [place] [address]             [phone]        No events  Vitals:   09/24/21 0016 09/24/21 0520  BP: (!) 101/53 122/74  Pulse: 66 61  Resp: 18   Temp:  98 F (36.7 C)  SpO2: 97% 98%   Alert NAD Sinus  EWOB  OR today for CABG  Jon Nelson

## 2021-09-24 NOTE — Plan of Care (Signed)
  Problem: Education: Goal: Knowledge of General Education information will improve Description: Including pain rating scale, medication(s)/side effects and non-pharmacologic comfort measures Outcome: Progressing   Problem: Clinical Measurements: Goal: Diagnostic test results will improve Outcome: Progressing Goal: Respiratory complications will improve Outcome: Progressing Note: Extubated at 1745 to 4L [place]  Goal: Cardiovascular complication will be avoided Outcome: Progressing   Problem: Coping: Goal: Level of anxiety will decrease Outcome: Progressing   Problem: Pain Managment: Goal: General experience of comfort will improve Outcome: Progressing   Problem: Cardiac: Goal: Will achieve and/or maintain hemodynamic stability Outcome: Progressing   Problem: Clinical Measurements: Goal: Postoperative complications will be avoided or minimized Outcome: Progressing   Problem: Respiratory: Goal: Respiratory status will improve Outcome: Progressing

## 2021-09-24 NOTE — Progress Notes (Signed)
   09/24/21 0930  Clinical Encounter Type  Visited With Patient not available  Visit Type Follow-up  Referral From Nurse  Consult/Referral To Chaplain   Chaplain responded to the consult request for an Advance Directive. Unit Secretary in [place].Short Stay confirmed that patient was in surgery. Chaplain remains available for follow up. This note was prepared by Jeanine Luz, M.Div..  For questions please contact by phone [phone].

## 2021-09-24 NOTE — Op Note (Signed)
[address]       [place], [address]             [phone]                                          09/24/2021 Patient:  Jon Nelson Pre-Op Dx: NSTEMI   Three-vessel coronary artery disease   Hypertension   Diabetes mellitus Post-op Dx: Same Procedure: CABG X 3.  LIMA to LAD, reverse saphenous vein graft to PDA, reverse saphenous vein graft to obtuse marginal. Endoscopic greater saphenous vein harvest on the right   Surgeon and Role:      * Hunter Pinkard, Lucile Crater, MD - Primary    *D. Tacy Dura, PA-C- assisting for vein harvesting  Anesthesia  general EBL: 700 ml Blood Administration: None Xclamp Time:  56 min Pump Time:  93 min  Drains: 19 F blake drain: R, L, mediastinal  Wires: None Counts: correct   Indications: 75 year old male admitted following an NSTEMI.  He underwent a left heart cath which showed severe three-vessel coronary artery disease.  I personally reviewed his left heart cath and he has good targets for surgical bypass.  His echocardiogram shows mildly reduced LV function with preserved RV function.  There is no significant valvular disease.   Findings: Good conduit, good distal targets.  Operative Technique: All invasive lines were placed in pre-op holding.  After the risks, benefits and alternatives were thoroughly discussed, the patient was brought to the operative theatre.  Anesthesia was induced, and the patient was prepped and draped in normal sterile fashion.  An appropriate surgical pause was performed, and pre-operative antibiotics were dosed accordingly.  We began with simultaneous incisions along the right leg for harvesting of the greater saphenous vein and the chest for the sternotomy.  In regards to the sternotomy, this was carried down with bovie cautery, and the sternum was divided with a reciprocating saw.  Meticulous hemostasis was obtained.  The left internal thoracic artery was exposed and harvested in in  pedicled fashion.  The patient was systemically heparinized, and the artery was divided distally, and placed in a papaverine sponge.    The sternal elevator was removed, and a retractor was placed.  The pericardium was divided in the midline and fashioned into a cradle with pericardial stitches.   After we confirmed an appropriate ACT, the ascending aorta was cannulated in standard fashion.  The right atrial appendage was used for venous cannulation site.  Cardiopulmonary bypass was initiated, and the heart retractor was placed. The cross clamp was applied, and a dose of anterograde cardioplegia was given with good arrest of the heart.  We moved to the posterior wall of the heart, and found a good target on the PDA.  An arteriotomy was made, and the vein graft was anastomosed to it in an end to side fashion.  Next we exposed the lateral wall, and found a good target on the obtuse marginal.  An end to side anastomosis with the vein graft was then created.  Finally, we exposed a good target on the LAD, and fashioned an end to side anastomosis between it and the LITA.  We began to re-warm, and a re-animation dose of cardioplegia was given.  The heart was de-aired, and the cross clamp was removed.  Meticulous hemostasis was obtained.    A partial occludding  clamp was then placed on the ascending aorta, and we created an end to side anastomosis between it and the proximal vein grafts.  The proximal sites were marked with rings.  Hemostasis was obtained, and we separated from cardiopulmonary bypass without event.  The heparin was reversed with protamine.  Chest tubes and wires were placed, and the sternum was re-approximated with sternal wires.  The soft tissue and skin were re-approximated wth absorbable suture.    The patient tolerated the procedure without any immediate complications, and was transferred to the ICU in guarded condition.  Jon Nelson

## 2021-09-24 NOTE — Hospital Course (Addendum)
HPI: The patient is a 75 year old male with a history of multiple medical and cardiac comorbidities.  Significant diagnoses include hypertension, hyperlipidemia, diabetes as well as a previous stroke in March 2020.  He presented to the emergency department on 09/17/2021 at [facility] with chief complaint of right arm pain and has subsequently ruled in for non-STEMI.  He was transferred to [facility] on 09/18/2021 for further evaluation to include cardiac catheterization.  He had initial right arm pain symptoms on 09/15/2021 which resolved.  It did recur on the 17th prompting the ER visit.  It was associated with some mild chest pain as well as diaphoresis and nausea/vomiting.  He denies shortness of breath, orthopnea, PND, lower extremity edema, palpitations, lightheadedness, dizziness or syncope.  Initial EKG in the ER at [facility] emergency department showed possible Q waves in leads V1-V3 and T wave inversions in lead aVL.  Troponin I was elevated at 0.88 and proBNP was normal.  Repeat EKG on 1018 showed evolving T wave changes with clear Q waves in V1-V3, and deep T wave inversions in the anterolateral leads.  Repeat troponin was markedly elevated up to 16.3.  He was started on IV heparin.  Echocardiogram reportedly showed segmental wall motion abnormalities in the distribution of the LAD.  He was transferred to [facility] and cardiac catheterization results are listed below.  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  We are asked to see the patient in consideration of CABG for revascularization.  He does have a history of previous loop recorder with no atrial fibrillation. Dr. Kipp Brood discussed the need for coronary artery bypass grafting surgery. Potential risks, benefits, and complications of the surgery were discussed with the patient and he agreed to proceed with surgery. Pre operative carotid duplex US showed left ICA 40-59% and no significant  right internal carotid stenosis.  Hospital Course: Patient underwent a CABG x 3 on 09/24/2021. He was transferred from the ICU to [facility] in stable condition.  He was extubated the evening of surgery.  He was hypotensive and required Neo-synephrine as hemodynamics allowed.  He was started on Plavix for ACS on admission.  Gordy Councilman, a line, chest tubes and foley were all removed early in his post operative course. He was weaned off the Insulin drip. He will be restarted on Metformin closer to/at discharge. He was started on Lopressor. He was volume overloaded and diuresed accordingly. He had expected post op blood loss anemia. He did not require a post op transfusion. Last H and H up to 13.1 and 37.3. He was volume overloaded and diuresed accordingly. PT/OT consults were obtained as patient lives alone. He was felt surgically stable for transfer from the ICU to 4E on 10/26;however, a bed was not available. He was weaned off oxygen and had good oxygenation on room air. He was tolerating a diet and had a bowel movement. His wounds are clean, dry, and healing without signs of infection. He is felt surgically stable for discharge today.

## 2021-09-24 NOTE — Plan of Care (Signed)
  Problem: Education: Goal: Knowledge of General Education information will improve Description: Including pain rating scale, medication(s)/side effects and non-pharmacologic comfort measures Outcome: Progressing   Problem: Health Behavior/Discharge Planning: Goal: Ability to manage health-related needs will improve Outcome: Progressing   Problem: Clinical Measurements: Goal: Ability to maintain clinical measurements within normal limits will improve Outcome: Progressing   Problem: Clinical Measurements: Goal: Will remain free from infection Outcome: Progressing   Problem: Clinical Measurements: Goal: Respiratory complications will improve Outcome: Progressing   Problem: Clinical Measurements: Goal: Cardiovascular complication will be avoided Outcome: Progressing   Problem: Activity: Goal: Risk for activity intolerance will decrease Outcome: Progressing   Problem: Safety: Goal: Ability to remain free from injury will improve Outcome: Progressing   Problem: Skin Integrity: Goal: Risk for impaired skin integrity will decrease Outcome: Progressing

## 2021-09-24 NOTE — Anesthesia Postprocedure Evaluation (Signed)
Anesthesia Post Note  Patient: Advice worker  Procedure(s) Performed: CORONARY ARTERY BYPASS GRAFTING (CABG) TIMES THREE , ON PUMP, USING LEFT INTERNAL MAMMARY ARTERY AND ENDOSCOPICALLY HARVESTED RIGHT GREATER SAPHENOUS VEIN (Chest) TRANSESOPHAGEAL ECHOCARDIOGRAM (TEE) APPLICATION OF CELL SAVER ENDOVEIN HARVEST OF GREATER SAPHENOUS VEIN (Right)     Patient location during evaluation: SICU Anesthesia Type: General Level of consciousness: sedated Pain management: pain level controlled Vital Signs Assessment: post-procedure vital signs reviewed and stable Respiratory status: patient remains intubated per anesthesia plan Cardiovascular status: stable Postop Assessment: no apparent nausea or vomiting Anesthetic complications: no   No notable events documented.  Last Vitals:  Vitals:   09/24/21 0759 09/24/21 1349  BP: (!) 158/70   Pulse: 63   Resp: 17   Temp: 36.5 C   SpO2: 98% 98%    Last Pain:  Vitals:   09/24/21 0759  TempSrc: Oral  PainSc:                  Jon Nelson

## 2021-09-25 ENCOUNTER — Encounter (HOSPITAL_COMMUNITY): Payer: Self-pay | Admitting: Thoracic Surgery (Cardiothoracic Vascular Surgery)

## 2021-09-25 ENCOUNTER — Inpatient Hospital Stay (HOSPITAL_COMMUNITY): Payer: Medicare HMO

## 2021-09-25 LAB — CBC
HCT: 36.7 % — ABNORMAL LOW (ref 39.0–52.0)
HCT: 40.5 % (ref 39.0–52.0)
Hemoglobin: 12.6 g/dL — ABNORMAL LOW (ref 13.0–17.0)
Hemoglobin: 13.8 g/dL (ref 13.0–17.0)
MCH: 30.2 pg (ref 26.0–34.0)
MCH: 30.3 pg (ref 26.0–34.0)
MCHC: 34.3 g/dL (ref 30.0–36.0)
MCHC: 34.1 g/dL (ref 30.0–36.0)
MCV: 88 fL (ref 80.0–100.0)
MCV: 89 fL (ref 80.0–100.0)
Platelets: 247 10*3/uL (ref 150–400)
Platelets: 228 10*3/uL (ref 150–400)
RBC: 4.17 MIL/uL — ABNORMAL LOW (ref 4.22–5.81)
RBC: 4.55 MIL/uL (ref 4.22–5.81)
RDW: 13.7 % (ref 11.5–15.5)
RDW: 13.4 % (ref 11.5–15.5)
WBC: 22.5 10*3/uL — ABNORMAL HIGH (ref 4.0–10.5)
WBC: 21.2 10*3/uL — ABNORMAL HIGH (ref 4.0–10.5)
nRBC: 0 % (ref 0.0–0.2)
nRBC: 0 % (ref 0.0–0.2)

## 2021-09-25 LAB — BASIC METABOLIC PANEL
Anion gap: 4 — ABNORMAL LOW (ref 5–15)
Anion gap: 7 (ref 5–15)
BUN: 13 mg/dL (ref 8–23)
BUN: 18 mg/dL (ref 8–23)
CO2: 22 mmol/L (ref 22–32)
CO2: 23 mmol/L (ref 22–32)
Calcium: 7.6 mg/dL — ABNORMAL LOW (ref 8.9–10.3)
Calcium: 8.1 mg/dL — ABNORMAL LOW (ref 8.9–10.3)
Chloride: 101 mmol/L (ref 98–111)
Chloride: 97 mmol/L — ABNORMAL LOW (ref 98–111)
Creatinine, Ser: 0.63 mg/dL (ref 0.61–1.24)
Creatinine, Ser: 0.7 mg/dL (ref 0.61–1.24)
GFR, Estimated: >60 mL/min (ref >60)
GFR, Estimated: >60 mL/min (ref >60)
Glucose, Bld: 100 mg/dL — ABNORMAL HIGH (ref 70–99)
Glucose, Bld: 178 mg/dL — ABNORMAL HIGH (ref 70–99)
Potassium: 4.6 mmol/L (ref 3.5–5.1)
Potassium: 4.9 mmol/L (ref 3.5–5.1)
Sodium: 127 mmol/L — ABNORMAL LOW (ref 135–145)
Sodium: 127 mmol/L — ABNORMAL LOW (ref 135–145)

## 2021-09-25 LAB — GLUCOSE, CAPILLARY
Glucose-Capillary: 104 mg/dL — ABNORMAL HIGH (ref 70–99)
Glucose-Capillary: 108 mg/dL — ABNORMAL HIGH (ref 70–99)
Glucose-Capillary: 108 mg/dL — ABNORMAL HIGH (ref 70–99)
Glucose-Capillary: 109 mg/dL — ABNORMAL HIGH (ref 70–99)
Glucose-Capillary: 109 mg/dL — ABNORMAL HIGH (ref 70–99)
Glucose-Capillary: 110 mg/dL — ABNORMAL HIGH (ref 70–99)
Glucose-Capillary: 118 mg/dL — ABNORMAL HIGH (ref 70–99)
Glucose-Capillary: 126 mg/dL — ABNORMAL HIGH (ref 70–99)
Glucose-Capillary: 141 mg/dL — ABNORMAL HIGH (ref 70–99)
Glucose-Capillary: 148 mg/dL — ABNORMAL HIGH (ref 70–99)
Glucose-Capillary: 164 mg/dL — ABNORMAL HIGH (ref 70–99)
Glucose-Capillary: 173 mg/dL — ABNORMAL HIGH (ref 70–99)
Glucose-Capillary: 193 mg/dL — ABNORMAL HIGH (ref 70–99)
Glucose-Capillary: 99 mg/dL (ref 70–99)

## 2021-09-25 LAB — MAGNESIUM
Magnesium: 2.5 mg/dL — ABNORMAL HIGH (ref 1.7–2.4)
Magnesium: 2.1 mg/dL (ref 1.7–2.4)

## 2021-09-25 MED ORDER — INSULIN ASPART 100 UNIT/ML IJ SOLN: 2.0000 [IU] | INTRAMUSCULAR | Status: DC

## 2021-09-25 MED ORDER — CLOPIDOGREL BISULFATE 75 MG PO TABS
75.0000 mg | ORAL_TABLET | Freq: Every day | ORAL | Status: DC
  Administered 2021-09-25 – 2021-09-28 (×4): 75 mg via ORAL
  Filled 2021-09-25 (×4): qty 1

## 2021-09-25 MED ORDER — ENOXAPARIN SODIUM 30 MG/0.3ML IJ SOSY
30.0000 mg | PREFILLED_SYRINGE | Freq: Every day | INTRAMUSCULAR | Status: DC
  Administered 2021-09-25 – 2021-09-27 (×3): 30 mg via SUBCUTANEOUS
  Filled 2021-09-25 (×3): qty 0.3

## 2021-09-25 MED ORDER — INSULIN DETEMIR 100 UNIT/ML ~~LOC~~ SOLN
5.0000 [IU] | Freq: Two times a day (BID) | SUBCUTANEOUS | Status: DC
  Administered 2021-09-25 – 2021-09-27 (×6): 5 [IU] via SUBCUTANEOUS
  Filled 2021-09-25 (×9): qty 0.05

## 2021-09-25 MED ORDER — INSULIN ASPART 100 UNIT/ML IJ SOLN
0.0000 [IU] | INTRAMUSCULAR | Status: DC
  Administered 2021-09-25 (×2): 4 [IU] via SUBCUTANEOUS
  Administered 2021-09-25 (×2): 2 [IU] via SUBCUTANEOUS
  Administered 2021-09-26: 4 [IU] via SUBCUTANEOUS
  Administered 2021-09-26 (×2): 2 [IU] via SUBCUTANEOUS
  Administered 2021-09-26: 4 [IU] via SUBCUTANEOUS
  Administered 2021-09-26: 2 [IU] via SUBCUTANEOUS
  Administered 2021-09-27 (×2): 4 [IU] via SUBCUTANEOUS
  Administered 2021-09-27 (×2): 8 [IU] via SUBCUTANEOUS
  Administered 2021-09-27: 2 [IU] via SUBCUTANEOUS

## 2021-09-25 MED ORDER — ASPIRIN EC 81 MG PO TBEC
81.0000 mg | DELAYED_RELEASE_TABLET | Freq: Every day | ORAL | Status: DC
  Administered 2021-09-25 – 2021-09-28 (×4): 81 mg via ORAL
  Filled 2021-09-25 (×4): qty 1

## 2021-09-25 NOTE — Progress Notes (Signed)
      [address]       [place] [address]             [phone]                 1 Day Post-Op Procedure(s) (LRB): CORONARY ARTERY BYPASS GRAFTING (CABG) TIMES THREE , ON PUMP, USING LEFT INTERNAL MAMMARY ARTERY AND ENDOSCOPICALLY HARVESTED RIGHT GREATER SAPHENOUS VEIN (N/A) TRANSESOPHAGEAL ECHOCARDIOGRAM (TEE) (N/A) APPLICATION OF CELL SAVER ENDOVEIN HARVEST OF GREATER SAPHENOUS VEIN (Right)   Events: No events _______________________________________________________________ Vitals: BP 94/63   Pulse 86   Temp 99.9 F (37.7 C)   Resp 17   Ht 5\' 9"  (1.753 m)   Wt 82.1 kg   SpO2 98%   BMI 26.73 kg/m  Filed Weights   09/24/21 0520 09/24/21 0759 09/25/21 0600  Weight: 77.9 kg 80.3 kg 82.1 kg     - Neuro: alert NAD   - Cardiovascular: sinus  Drips: neo 5.      - Pulm: EWOB.  SS output   ABG    Component Value Date/Time   PHART 7.383 09/24/2021 1852   PCO2ART 35.8 09/24/2021 1852   PO2ART 127 (H) 09/24/2021 1852   HCO3 21.3 09/24/2021 1852   TCO2 22 09/24/2021 1852   ACIDBASEDEF 3.0 (H) 09/24/2021 1852   O2SAT 99.0 09/24/2021 1852    - Abd: ND - Extremity: warm  .Intake/Output      10/24 0701 10/25 0700 10/25 0701 10/26 0700   P.Jon.     I.V. (mL/kg) 3488.3 (42.5)    Blood 486    IV Piggyback 1212.2    Total Intake(mL/kg) 5186.5 (63.2)    Urine (mL/kg/hr) 3135 (1.6)    Blood 700    Chest Tube 211    Total Output 4046    Net +1140.5            _______________________________________________________________ Labs: CBC Latest Ref Rng & Units 09/25/2021 09/24/2021 09/24/2021  WBC 4.0 - 10.5 K/uL 22.5(H) 33.4(H) -  Hemoglobin 13.0 - 17.0 g/dL 12.6(L) 13.4 12.6(L)  Hematocrit 39.0 - 52.0 % 36.7(L) 39.5 37.0(L)  Platelets 150 - 400 K/uL 247 296 -   CMP Latest Ref Rng & Units 09/25/2021 09/24/2021 09/24/2021  Glucose 70 - 99 mg/dL 100(H) 119(H) -  BUN 8 - 23 mg/dL 13 18 -  Creatinine 0.61 - 1.24 mg/dL 0.63 0.65 -  Sodium 135 -  145 mmol/L 127(L) 132(L) 135  Potassium 3.5 - 5.1 mmol/L 4.6 5.0 4.2  Chloride 98 - 111 mmol/L 101 105 -  CO2 22 - 32 mmol/L 22 22 -  Calcium 8.9 - 10.3 mg/dL 7.6(L) 7.4(L) -  Total Protein 6.0 - 8.5 g/dL - - -  Total Bilirubin 0.0 - 1.2 mg/dL - - -  Alkaline Phos 44 - 121 IU/L - - -  AST 0 - 40 IU/L - - -  ALT 0 - 44 IU/L - - -    CXR: clear  _______________________________________________________________  Assessment and Plan: POD 1 s/p CABG  Neuro: pain controlled CV: removing A line.  On A/S/BB.  Will start plavix, and decrease aspirin Pulm: pulm hygiene Renal: creat stable GI: on diet Heme: stable ID: [email or web address] Endo: SSI  Dispo: possible floor   Jon Nelson Jon Nelson 09/25/2021 7:48 AM

## 2021-09-25 NOTE — Progress Notes (Signed)
      [address]       [place] [address]             [phone]      POD # 1 CABG  BP 114/73   Pulse 81   Temp 99.7 F (37.6 C)   Resp 16   Ht 5\' 9"  (1.753 m)   Wt 82.1 kg   SpO2 96%   BMI 26.73 kg/m   Intake/Output Summary (Last 24 hours) at 09/25/2021 1757 Last data filed at 09/25/2021 1600 Gross per 24 hour  Intake 1720.79 ml  Output 1906 ml  Net -185.21 ml   PM labs pending  CBG elevated- continue SSI  Zuleima Haser C. Roxan Hockey, MD [facility] [phone]

## 2021-09-26 ENCOUNTER — Inpatient Hospital Stay (HOSPITAL_COMMUNITY): Payer: Medicare HMO

## 2021-09-26 LAB — BASIC METABOLIC PANEL
Anion gap: 5 (ref 5–15)
Anion gap: 6 (ref 5–15)
BUN: 15 mg/dL (ref 8–23)
BUN: 15 mg/dL (ref 8–23)
CO2: 25 mmol/L (ref 22–32)
CO2: 25 mmol/L (ref 22–32)
Calcium: 8.1 mg/dL — ABNORMAL LOW (ref 8.9–10.3)
Calcium: 8.1 mg/dL — ABNORMAL LOW (ref 8.9–10.3)
Chloride: 102 mmol/L (ref 98–111)
Chloride: 99 mmol/L (ref 98–111)
Creatinine, Ser: 0.72 mg/dL (ref 0.61–1.24)
Creatinine, Ser: 0.79 mg/dL (ref 0.61–1.24)
GFR, Estimated: >60 mL/min (ref >60)
GFR, Estimated: >60 mL/min (ref >60)
Glucose, Bld: 134 mg/dL — ABNORMAL HIGH (ref 70–99)
Glucose, Bld: 191 mg/dL — ABNORMAL HIGH (ref 70–99)
Potassium: 4.5 mmol/L (ref 3.5–5.1)
Potassium: 4.5 mmol/L (ref 3.5–5.1)
Sodium: 132 mmol/L — ABNORMAL LOW (ref 135–145)
Sodium: 130 mmol/L — ABNORMAL LOW (ref 135–145)

## 2021-09-26 LAB — TYPE AND SCREEN
ABO/RH(D): O POS
Antibody Screen: NEGATIVE
Unit division: 0
Unit division: 0

## 2021-09-26 LAB — GLUCOSE, CAPILLARY
Glucose-Capillary: 132 mg/dL — ABNORMAL HIGH (ref 70–99)
Glucose-Capillary: 197 mg/dL — ABNORMAL HIGH (ref 70–99)
Glucose-Capillary: 145 mg/dL — ABNORMAL HIGH (ref 70–99)
Glucose-Capillary: 157 mg/dL — ABNORMAL HIGH (ref 70–99)
Glucose-Capillary: 196 mg/dL — ABNORMAL HIGH (ref 70–99)
Glucose-Capillary: 137 mg/dL — ABNORMAL HIGH (ref 70–99)

## 2021-09-26 LAB — CBC
HCT: 36.3 % — ABNORMAL LOW (ref 39.0–52.0)
Hemoglobin: 12.3 g/dL — ABNORMAL LOW (ref 13.0–17.0)
MCH: 29.9 pg (ref 26.0–34.0)
MCHC: 33.9 g/dL (ref 30.0–36.0)
MCV: 88.1 fL (ref 80.0–100.0)
Platelets: 200 10*3/uL (ref 150–400)
RBC: 4.12 MIL/uL — ABNORMAL LOW (ref 4.22–5.81)
RDW: 13.4 % (ref 11.5–15.5)
WBC: 16 10*3/uL — ABNORMAL HIGH (ref 4.0–10.5)
nRBC: 0 % (ref 0.0–0.2)

## 2021-09-26 LAB — BPAM RBC
Blood Product Expiration Date: 202211182359
Blood Product Expiration Date: 202211182359
ISSUE DATE / TIME: 202210240921
ISSUE DATE / TIME: 202210240921
Unit Type and Rh: 5100
Unit Type and Rh: 5100

## 2021-09-26 MED ORDER — FUROSEMIDE 10 MG/ML IJ SOLN
40.0000 mg | Freq: Once | INTRAMUSCULAR | Status: AC
  Administered 2021-09-26: 40 mg via INTRAVENOUS
  Filled 2021-09-26: qty 4

## 2021-09-26 MED ORDER — ~~LOC~~ CARDIAC SURGERY, PATIENT & FAMILY EDUCATION: Freq: Once | Status: AC

## 2021-09-26 MED ORDER — SODIUM CHLORIDE 0.9% FLUSH
3.0000 mL | Freq: Two times a day (BID) | INTRAVENOUS | Status: DC
  Administered 2021-09-26 – 2021-09-27 (×3): 3 mL via INTRAVENOUS

## 2021-09-26 MED ORDER — FUROSEMIDE 10 MG/ML IJ SOLN: 40.0000 mg | Freq: Once | INTRAMUSCULAR | Status: DC

## 2021-09-26 MED ORDER — SODIUM CHLORIDE 0.9% FLUSH: 3.0000 mL | INTRAVENOUS | Status: DC | PRN

## 2021-09-26 MED ORDER — SODIUM CHLORIDE 0.9 % IV SOLN: 250.0000 mL | INTRAVENOUS | Status: DC | PRN

## 2021-09-26 NOTE — Progress Notes (Signed)
Inpatient Diabetes Program Recommendations  AACE/ADA: New Consensus Statement on Inpatient Glycemic Control (2015)  Target Ranges:  Prepandial:   less than 140 mg/dL      Peak postprandial:   less than 180 mg/dL (1-2 hours)      Critically ill patients:  140 - 180 mg/dL   Lab Results  Component Value Date   GLUCAP 197 (H) 09/26/2021   HGBA1C 7.2 (H) 09/22/2021    Review of Glycemic Control Results for Jon Nelson, Jon Nelson (MRN [id number]) as of 09/26/2021 10:10  Ref. Range 09/25/2021 20:42 09/25/2021 23:36 09/26/2021 03:42 09/26/2021 07:37  Glucose-Capillary Latest Ref Range: 70 - 99 mg/dL 173 (H) 164 (H) 132 (H) 197 (H)   Diabetes history: Type 2 DM Outpatient Diabetes medications: Metformin 500 mg QD Current orders for Inpatient glycemic control: Novolog 0-24 units Q4H, Levemir 5 units BID  Inpatient Diabetes Program Recommendations:    Consider changing correction to TID now that patient has diet order and increase Levemir to 7 units BID.  Thanks, Bronson Curb, MSN, RNC-OB Diabetes Coordinator [phone] (8a-5p)

## 2021-09-26 NOTE — Progress Notes (Signed)
Patient ID: Jon Nelson, male   DOB: 02/22/1946, 75 y.o.   MRN: [id number] T[facility] Evening Rounds:  Hemodynamically stable in sinus rhythm.  Diuresing well.  Sitting up in chair eating dinner.   Awaiting transfer

## 2021-09-26 NOTE — Progress Notes (Addendum)
[facility] DAILY ICU PROGRESS NOTE                   [address]            ,[place] [address]          [phone]   2 Days Post-Op Procedure(s) (LRB): CORONARY ARTERY BYPASS GRAFTING (CABG) TIMES THREE , ON PUMP, USING LEFT INTERNAL MAMMARY ARTERY AND ENDOSCOPICALLY HARVESTED RIGHT GREATER SAPHENOUS VEIN (N/A) TRANSESOPHAGEAL ECHOCARDIOGRAM (TEE) (N/A) APPLICATION OF CELL SAVER ENDOVEIN HARVEST OF GREATER SAPHENOUS VEIN (Right)  Total Length of Stay:  LOS: 8 days   Subjective: Patient sitting in chair this am. He is feeling a little rough, but no specific comlaint.  Objective: Vital signs in last 24 hours: Temp:  [97.8 F (36.6 C)-99.9 F (37.7 C)] 97.8 F (36.6 C) (10/25 2339) Pulse Rate:  [70-91] 76 (10/26 0600) Cardiac Rhythm: Normal sinus rhythm (10/26 0400) Resp:  [8-23] 16 (10/26 0600) BP: (91-123)/(53-78) 107/60 (10/26 0600) SpO2:  [93 %-99 %] 95 % (10/26 0600) Arterial Line BP: (89-129)/(43-50) 91/46 (10/25 0830) Weight:  [82.1 kg] 82.1 kg (10/26 0500)  Filed Weights   09/24/21 0759 09/25/21 0600 09/26/21 0500  Weight: 80.3 kg 82.1 kg 82.1 kg    Weight change: 1.813 kg   Hemodynamic parameters for last 24 hours:    Intake/Output from previous day: 10/25 0701 - 10/26 0700 In: 1019 [P.O.:360; I.V.:459; IV Piggyback:200] Out: 2255 [Urine:2070; Chest Tube:185]  Intake/Output this shift: Total I/O In: 555.6 [I.V.:355.6; IV Piggyback:200] Out: 1810 [Urine:1770; Chest Tube:40]  Current Meds: Scheduled Meds:  acetaminophen  1,000 mg Oral Q6H   Or   acetaminophen (TYLENOL) oral liquid 160 mg/5 mL  1,000 mg Per Tube Q6H   aspirin EC  81 mg Oral Daily   bisacodyl  10 mg Oral Daily   Or   bisacodyl  10 mg Rectal Daily   Chlorhexidine Gluconate Cloth  6 each Topical Daily   clopidogrel  75 mg Oral Daily   docusate sodium  200 mg Oral Daily   enoxaparin (LOVENOX) injection  30 mg Subcutaneous QHS   insulin aspart  0-24 Units Subcutaneous Q4H    insulin detemir  5 Units Subcutaneous Q12H   metoprolol tartrate  12.5 mg Oral BID   Or   metoprolol tartrate  12.5 mg Per Tube BID   pantoprazole  40 mg Oral Daily   rosuvastatin  40 mg Oral Daily   sodium chloride flush  10-40 mL Intracatheter Q12H   sodium chloride flush  3 mL Intravenous Q12H   Continuous Infusions:  sodium chloride Stopped (09/25/21 0507)   sodium chloride     sodium chloride 10 mL/hr at 09/25/21 2114   albumin human 12.5 g (09/24/21 1850)   And   sodium chloride      ceFAZolin (ANCEF) IV 2 g (09/26/21 0629)   insulin Stopped (09/25/21 0751)   lactated ringers     lactated ringers 20 mL/hr at 09/26/21 0600   PRN Meds:.sodium chloride, albumin human **AND** sodium chloride, dextrose, metoprolol tartrate, midazolam, morphine injection, ondansetron (ZOFRAN) IV, oxyCODONE, sodium chloride flush, sodium chloride flush, traMADol  General appearance: alert, cooperative, and no distress Neurologic: intact Heart: RRR Lungs: Slightly diminished bibasilar breath sounds Abdomen: Soft, non tender, occasional bowel sounds Extremities: SCDs in place Wound: Aquacel sternal intact. RLE wound dressing partially removed and wound is clean and dry.  Lab Results: CBC: Recent Labs    09/25/21 1730 09/26/21 0413  WBC  21.2* 16.0*  HGB 13.8 12.3*  HCT 40.5 36.3*  PLT 228 200   BMET:  Recent Labs    09/25/21 1730 09/26/21 0413  NA 127* 132*  K 4.9 4.5  CL 97* 102  CO2 23 25  GLUCOSE 178* 134*  BUN 18 15  CREATININE 0.70 0.72  CALCIUM 8.1* 8.1*    CMET: Lab Results  Component Value Date   WBC 16.0 (H) 09/26/2021   HGB 12.3 (L) 09/26/2021   HCT 36.3 (L) 09/26/2021   PLT 200 09/26/2021   GLUCOSE 134 (H) 09/26/2021   CHOL 226 (H) 08/28/2021   TRIG 258 (H) 08/28/2021   HDL 28 (L) 08/28/2021   LDLCALC 150 (H) 08/28/2021   ALT 14 08/28/2021   AST 16 08/28/2021   NA 132 (L) 09/26/2021   K 4.5 09/26/2021   CL 102 09/26/2021   CREATININE 0.72 09/26/2021    BUN 15 09/26/2021   CO2 25 09/26/2021   INR 1.4 (H) 09/24/2021   HGBA1C 7.2 (H) 09/22/2021   MICROALBUR 150 06/28/2020      PT/INR:  Recent Labs    09/24/21 1347  LABPROT 16.7*  INR 1.4*   Radiology: No results found.   Assessment/Plan: S/P Procedure(s) (LRB): CORONARY ARTERY BYPASS GRAFTING (CABG) TIMES THREE , ON PUMP, USING LEFT INTERNAL MAMMARY ARTERY AND ENDOSCOPICALLY HARVESTED RIGHT GREATER SAPHENOUS VEIN (N/A) TRANSESOPHAGEAL ECHOCARDIOGRAM (TEE) (N/A) APPLICATION OF CELL SAVER ENDOVEIN HARVEST OF GREATER SAPHENOUS VEIN (Right) CV-S/p NSTEMI. SR. On Lopressor 12.5 mg bid, Plavix 75 mg daily.  Pulmonary-on room air. Chest tubes with 185 cc last 24 hours. CXR this am appears stable (bibasilar atelectasis, small left pleural effusion). Remove chest tubes. Encourage incentive spirometer Volume overload-will give Lasix Expected post op blood loss anemia-H and H this am decreased to 12.3 and 36.3 DM-CBGs 173/164/132. On Insulin. Restart Metformin closer to discharge. Pre op HGA1C 7.2. Remove right IJ transducer, chest tubes Patient lives alone. PT/OT consult  8. May transfer if bed available   Donielle Liston Alba PA-C 09/26/2021 6:59 AM   Agree with above. DC chest tubes. Diuresis today. Transfer to floor.  Hancel Ion Bary Leriche

## 2021-09-26 NOTE — Progress Notes (Signed)
PT Cancellation Note  Patient Details Name: Jon Nelson MRN: [id number] DOB: [birth year]   Cancelled Treatment:    Reason Eval/Treat Not Completed: Other (comment) Orders received and chart reviewed.  Spoke with pt who was lethargic at this time and declined PT due to wanting to sleep.  He has ambulated with nursing earlier.  Will f/u as able.  Abran Richard, PT Acute Rehab Services Pager [phone] [facility] [place] 09/26/2021, 4:54 PM

## 2021-09-27 ENCOUNTER — Inpatient Hospital Stay (HOSPITAL_COMMUNITY): Payer: Medicare HMO

## 2021-09-27 LAB — BASIC METABOLIC PANEL
Anion gap: 6 (ref 5–15)
BUN: 15 mg/dL (ref 8–23)
CO2: 24 mmol/L (ref 22–32)
Calcium: 8 mg/dL — ABNORMAL LOW (ref 8.9–10.3)
Chloride: 100 mmol/L (ref 98–111)
Creatinine, Ser: 0.73 mg/dL (ref 0.61–1.24)
GFR, Estimated: >60 mL/min (ref >60)
Glucose, Bld: 134 mg/dL — ABNORMAL HIGH (ref 70–99)
Potassium: 4.3 mmol/L (ref 3.5–5.1)
Sodium: 130 mmol/L — ABNORMAL LOW (ref 135–145)

## 2021-09-27 LAB — GLUCOSE, CAPILLARY
Glucose-Capillary: 125 mg/dL — ABNORMAL HIGH (ref 70–99)
Glucose-Capillary: 165 mg/dL — ABNORMAL HIGH (ref 70–99)
Glucose-Capillary: 208 mg/dL — ABNORMAL HIGH (ref 70–99)
Glucose-Capillary: 204 mg/dL — ABNORMAL HIGH (ref 70–99)
Glucose-Capillary: 184 mg/dL — ABNORMAL HIGH (ref 70–99)

## 2021-09-27 LAB — CBC
HCT: 37.3 % — ABNORMAL LOW (ref 39.0–52.0)
Hemoglobin: 13.1 g/dL (ref 13.0–17.0)
MCH: 30.8 pg (ref 26.0–34.0)
MCHC: 35.1 g/dL (ref 30.0–36.0)
MCV: 87.6 fL (ref 80.0–100.0)
Platelets: 207 10*3/uL (ref 150–400)
RBC: 4.26 MIL/uL (ref 4.22–5.81)
RDW: 13.4 % (ref 11.5–15.5)
WBC: 15 10*3/uL — ABNORMAL HIGH (ref 4.0–10.5)
nRBC: 0 % (ref 0.0–0.2)

## 2021-09-27 MED ORDER — INSULIN ASPART 100 UNIT/ML IJ SOLN: 0.0000 [IU] | Freq: Three times a day (TID) | INTRAMUSCULAR | Status: DC

## 2021-09-27 MED ORDER — INSULIN ASPART 100 UNIT/ML IJ SOLN
0.0000 [IU] | Freq: Three times a day (TID) | INTRAMUSCULAR | Status: DC
  Administered 2021-09-28 (×2): 2 [IU] via SUBCUTANEOUS

## 2021-09-27 MED ORDER — POTASSIUM CHLORIDE CRYS ER 20 MEQ PO TBCR
20.0000 meq | EXTENDED_RELEASE_TABLET | Freq: Every day | ORAL | Status: DC
  Administered 2021-09-27 – 2021-09-28 (×2): 20 meq via ORAL
  Filled 2021-09-27: qty 1
  Filled 2021-09-27: qty 2

## 2021-09-27 MED ORDER — LACTULOSE 10 GM/15ML PO SOLN
20.0000 g | Freq: Once | ORAL | Status: AC
  Administered 2021-09-27: 20 g via ORAL
  Filled 2021-09-27: qty 30

## 2021-09-27 MED ORDER — FUROSEMIDE 40 MG PO TABS
40.0000 mg | ORAL_TABLET | Freq: Every day | ORAL | Status: DC
  Administered 2021-09-27 – 2021-09-28 (×2): 40 mg via ORAL
  Filled 2021-09-27 (×2): qty 1

## 2021-09-27 MED FILL — Mannitol IV Soln 20%: INTRAVENOUS | Qty: 500 | Status: AC

## 2021-09-27 MED FILL — Sodium Chloride IV Soln 0.9%: INTRAVENOUS | Qty: 2000 | Status: AC

## 2021-09-27 MED FILL — Calcium Chloride Inj 10%: INTRAVENOUS | Qty: 10 | Status: AC

## 2021-09-27 MED FILL — Sodium Bicarbonate IV Soln 8.4%: INTRAVENOUS | Qty: 50 | Status: AC

## 2021-09-27 MED FILL — Heparin Sodium (Porcine) Inj 1000 Unit/ML: INTRAMUSCULAR | Qty: 20 | Status: AC

## 2021-09-27 MED FILL — Electrolyte-R (PH 7.4) Solution: INTRAVENOUS | Qty: 4000 | Status: AC

## 2021-09-27 NOTE — Plan of Care (Signed)

## 2021-09-27 NOTE — Evaluation (Signed)
Physical Therapy Evaluation Patient Details Name: Jon Nelson MRN: [id number] DOB: 28-Apr-1946 Today's Date: 09/27/2021  History of Present Illness  The pt is a 75 yo male presenting 10/18 with R arm pain and was ruled in for NSTEMI. 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 PMH includes: DM II, HLD, HTN, TIA, and carotid artery stenosis.   Clinical Impression  Pt in bed upon arrival of PT, agreeable to evaluation at this time. Prior to admission the pt was completely independent without need for AD, driving, and living alone in a home on same property as other family. The pt now presents with limitations in functional mobility, power, dynamic stability, and activity tolerance due to above dx, and will continue to benefit from skilled PT to address these deficits. He was able to demo good capability with bed mobility, even from a flat bed at this time, and completed sit-stand without use of UE or any physical assist repeatedly through the session. The pt completed a good bout of hallway ambulation without need for physical assist or DME, but will benefit from more in-depth challenge of dynamic stability. Pt educated on fall risk reduction practices, walking program, and sternal precautions. Will likely progress well and not need additional PT services after d/c.        Recommendations for follow up therapy are one component of a multi-disciplinary discharge planning process, led by the attending physician.  Recommendations may be updated based on patient status, additional functional criteria and insurance authorization.  Follow Up Recommendations No PT follow up    Assistance Recommended at Discharge Intermittent Supervision/Assistance  Functional Status Assessment Patient has had a recent decline in their functional status and demonstrates the ability to make significant improvements in function in a reasonable and predictable amount of time.  Equipment Recommendations  3in1 (PT)    Recommendations for Other  Services       Precautions / Restrictions Precautions Precautions: Sternal Precaution Booklet Issued: No Precaution Comments: reviewed verbally Restrictions Weight Bearing Restrictions: Yes Other Position/Activity Restrictions: sternal precautions      Mobility  Bed Mobility Overal bed mobility: Modified Independent             General bed mobility comments: increased time, pt able to complete from flat bed    Transfers Overall transfer level: Needs assistance Equipment used: None Transfers: Sit to/from Stand Sit to Stand: Supervision           General transfer comment: no physical assist, completed 5xsit-stand in 16.7 sec    Ambulation/Gait Ambulation/Gait assistance: Min guard Gait Distance (Feet): 370 Feet Assistive device: None Gait Pattern/deviations: Step-through pattern Gait velocity: 0.66 m/s Gait velocity interpretation: 1.31 - 2.62 ft/sec, indicative of limited community ambulator General Gait Details: pt with intermittent increased speed of steps, but able to regain stability and more controlled gait without UE support or assist. generally stable even with changesin direction or challenges. VSS      Balance Overall balance assessment: Mild deficits observed, not formally tested                                           Pertinent Vitals/Pain Pain Assessment: No/denies pain    Home Living Family/patient expects to be discharged to:: Private residence Living Arrangements: Other relatives Available Help at Discharge: Family;Available PRN/intermittently Type of Home: House Home Access: Level entry       Home Layout: One level  Home Equipment: Conservation officer, nature (2 wheels);Shower seat;Grab bars - tub/shower      Prior Function Prior Level of Function : Independent/Modified Independent;Driving             Mobility Comments: pt reports independent, driving, completing all IADLs independently.       Hand Dominance    Dominant Hand: Right    Extremity/Trunk Assessment   Upper Extremity Assessment Upper Extremity Assessment: Overall WFL for tasks assessed    Lower Extremity Assessment Lower Extremity Assessment: Overall WFL for tasks assessed    Cervical / Trunk Assessment Cervical / Trunk Assessment: Other exceptions Cervical / Trunk Exceptions: thoracic surgery  Communication   Communication: No difficulties  Cognition Arousal/Alertness: Awake/alert Behavior During Therapy: WFL for tasks assessed/performed Overall Cognitive Status: Within Functional Limits for tasks assessed                                          General Comments General comments (skin integrity, edema, etc.): VSS on RA. HR to 107bpm with gait    Exercises     Assessment/Plan    PT Assessment Patient needs continued PT services  PT Problem List Decreased activity tolerance;Decreased balance;Decreased mobility       PT Treatment Interventions DME instruction;Gait training;Stair training;Functional mobility training;Therapeutic activities;Therapeutic exercise;Balance training;Patient/family education    PT Goals (Current goals can be found in the Care Plan section)  Acute Rehab PT Goals Patient Stated Goal: return home PT Goal Formulation: With patient Time For Goal Achievement: 10/11/21 Potential to Achieve Goals: Good    Frequency Min 3X/week    AM-PAC PT "6 Clicks" Mobility  Outcome Measure Help needed turning from your back to your side while in a flat bed without using bedrails?: None Help needed moving from lying on your back to sitting on the side of a flat bed without using bedrails?: None Help needed moving to and from a bed to a chair (including a wheelchair)?: A Little Help needed standing up from a chair using your arms (e.g., wheelchair or bedside chair)?: A Little Help needed to walk in hospital room?: A Little Help needed climbing 3-5 steps with a railing? : A Little 6 Click  Score: 20    End of Session Equipment Utilized During Treatment: Gait belt Activity Tolerance: Patient tolerated treatment well Patient left: in bed;with call bell/phone within reach Nurse Communication: Mobility status PT Visit Diagnosis: Other abnormalities of gait and mobility (R26.89);Unsteadiness on feet (R26.81)    Time: 7425-9563 PT Time Calculation (min) (ACUTE ONLY): 23 min   Charges:   PT Evaluation $PT Eval Low Complexity: 1 Low PT Treatments $Therapeutic Exercise: 8-22 mins        West Carbo, PT, DPT   Acute Rehabilitation Department Pager #: [phone]  Sandra Cockayne 09/27/2021, 2:18 PM

## 2021-09-27 NOTE — Discharge Instructions (Signed)

## 2021-09-27 NOTE — Discharge Summary (Signed)
Physician Discharge Summary       [address]       [place] [address]             [phone]    Patient ID: Jon Nelson MRN: [id number] DOB/AGE: 1945/12/14 75 y.o.  Admit date: 09/18/2021 Discharge date: 09/28/2021  Admission Diagnoses: NSTEMI (non-ST elevated myocardial infarction) ([place]) 2.   Coronary artery disease  Discharge Diagnoses:  S/P CABG x 3  3. History of carotid artery stenosis 4. History of diabetes ([place]) 5. History of HLD (hyperlipidemia) 6. History of HTN (hypertension) 7. History of TIA (transient ischemic attack)    Consults: None  Procedure (s):  CABG X 3.  LIMA to LAD, reverse saphenous vein graft to PDA, reverse saphenous vein graft to obtuse marginal. Endoscopic greater saphenous vein harvest on the right by Dr. Kipp Brood on 09/24/2021.    History of Presenting Illness: The patient is a 75 year old male with a history of multiple medical and cardiac comorbidities.  Significant diagnoses include hypertension, hyperlipidemia, diabetes as well as a previous stroke in March 2020.  He presented to the emergency department on 09/17/2021 at [facility] with chief complaint of right arm pain and has subsequently ruled in for non-STEMI.  He was transferred to [facility] on 09/18/2021 for further evaluation to include cardiac catheterization.  He had initial right arm pain symptoms on 09/15/2021 which resolved.  It did recur on the 17th prompting the ER visit.  It was associated with some mild chest pain as well as diaphoresis and nausea/vomiting.  He denies shortness of breath, orthopnea, PND, lower extremity edema, palpitations, lightheadedness, dizziness or syncope.  Initial EKG in the ER at [facility] emergency department showed possible Q waves in leads V1-V3 and T wave inversions in lead aVL.  Troponin I was elevated at 0.88 and proBNP was normal.  Repeat EKG on 1018 showed evolving T wave changes with clear Q waves in V1-V3, and  deep T wave inversions in the anterolateral leads.  Repeat troponin was markedly elevated up to 16.3.  He was started on IV heparin.  Echocardiogram reportedly showed segmental wall motion abnormalities in the distribution of the LAD.  He was transferred to [facility] and cardiac catheterization results are listed below.  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  We are asked to see the patient in consideration of CABG for revascularization.  He does have a history of previous loop recorder with no atrial fibrillation. Dr. Kipp Brood discussed the need for coronary artery bypass grafting surgery. Potential risks, benefits, and complications of the surgery were discussed with the patient and he agreed to proceed with surgery. Pre operative carotid duplex US showed left ICA 40-59% and no significant right internal carotid stenosis.   HPI: The patient is a 75 year old male with a history of multiple medical and cardiac comorbidities.  Significant diagnoses include hypertension, hyperlipidemia, diabetes as well as a previous stroke in March 2020.  He presented to the emergency department on 09/17/2021 at [facility] with chief complaint of right arm pain and has subsequently ruled in for non-STEMI.  He was transferred to [facility] on 09/18/2021 for further evaluation to include cardiac catheterization.  He had initial right arm pain symptoms on 09/15/2021 which resolved.  It did recur on the 17th prompting the ER visit.  It was associated with some mild chest pain as well as diaphoresis and nausea/vomiting.  He denies shortness of breath, orthopnea,  PND, lower extremity edema, palpitations, lightheadedness, dizziness or syncope.  Initial EKG in the ER at [facility] emergency department showed possible Q waves in leads V1-V3 and T wave inversions in lead aVL.  Troponin I was elevated at 0.88 and proBNP was normal.  Repeat EKG on 1018 showed evolving T wave  changes with clear Q waves in V1-V3, and deep T wave inversions in the anterolateral leads.  Repeat troponin was markedly elevated up to 16.3.  He was started on IV heparin.  Echocardiogram reportedly showed segmental wall motion abnormalities in the distribution of the LAD.  He was transferred to [facility] and cardiac catheterization results are listed below.  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  We are asked to see the patient in consideration of CABG for revascularization.  He does have a history of previous loop recorder with no atrial fibrillation. Dr. Kipp Brood discussed the need for coronary artery bypass grafting surgery. Potential risks, benefits, and complications of the surgery were discussed with the patient and he agreed to proceed with surgery. Pre operative carotid duplex US showed left ICA 40-59% and no significant right internal carotid stenosis.  Hospital Course: Patient underwent a CABG x 3 on 09/24/2021. He was transferred from the ICU to [place] in stable condition.  He was extubated the evening of surgery.  He was hypotensive and required Neo-synephrine as hemodynamics allowed.  He was started on Plavix for ACS on admission.  Gordy Councilman, a line, chest tubes and foley were all removed early in his post operative course. He was weaned off the Insulin drip. He will be restarted on Metformin closer to/at discharge. He was started on Lopressor. He was volume overloaded and diuresed accordingly. He had expected post op blood loss anemia. He did not require a post op transfusion. Last H and H up to 13.1 and 37.3. He was volume overloaded and diuresed accordingly. PT/OT consults were obtained as patient lives alone. He was felt surgically stable for transfer from the ICU to 4E on 10/26;however, a bed was not available. He was weaned off oxygen and had good oxygenation on room air. He was tolerating a diet and had a bowel movement. His wounds are clean,  dry, and healing without signs of infection. He is felt surgically stable for discharge today.   Latest Vital Signs: Blood pressure 116/75, pulse 82, temperature 98.4 F (36.9 C), temperature source Oral, resp. rate (!) 22, height _0  (1.753 m), weight 81.6 kg, SpO2 98 %.  Physical Exam: General appearance: alert, cooperative, and no distress Heart: regular rate and rhythm Lungs: min dim in bases Abdomen: benign Extremities: no edema or calf tenderness Wound: incis healing well Discharge Condition:Stable and discharged to home  Recent laboratory studies:  Lab Results  Component Value Date   WBC 15.0 (H) 09/27/2021   HGB 13.1 09/27/2021   HCT 37.3 (L) 09/27/2021   MCV 87.6 09/27/2021   PLT 207 09/27/2021   Lab Results  Component Value Date   NA 133 (L) 09/28/2021   K 4.4 09/28/2021   CL 103 09/28/2021   CO2 25 09/28/2021   CREATININE 0.64 09/28/2021   GLUCOSE 132 (H) 09/28/2021      Diagnostic Studies: DG Chest 2 View  Result Date: 09/27/2021 CLINICAL DATA:  Pleural effusion. EXAM: CHEST - 2 VIEW COMPARISON:  Chest radiograph, 09/26/2021 and 09/25/2021. FINDINGS: Support lines: Interval removal of RIGHT IJ CVC. Cardiac recorder. Overlying pacer leads. Cardiomediastinal silhouette is unchanged. Coronary  bypass. The lungs clear. No focal consolidation or mass. Trace residual RIGHT pleural effusion. No pneumothorax. No interval osseous abnormality. IMPRESSION: 1. Removal of RIGHT IJ CVC. 2. Trace residual RIGHT pleural effusion. Electronically Signed   By: Michaelle Birks M.D.   On: 09/27/2021 07:39   DG Chest 2 View  Result Date: 09/22/2021 CLINICAL DATA:  Preop EXAM: CHEST - 2 VIEW COMPARISON:  September 17, 2021 FINDINGS: The cardiomediastinal silhouette is unchanged in contour.Cardiac loop recorder. No pleural effusion. No pneumothorax. No acute pleuroparenchymal abnormality. Visualized abdomen is unremarkable. Multilevel degenerative changes of the thoracic spine.  IMPRESSION: No acute cardiopulmonary abnormality. Electronically Signed   By: Valentino Saxon M.D.   On: 09/22/2021 17:16   CARDIAC CATHETERIZATION  Result Date: 09/18/2021 CONCLUSIONS: 99% proximal LAD within a heavily calcified segment followed by 80% mid stenosis and 95% apical stenosis.  LAD flow is TIMI grade II.  First and second diagonals have severe obstructive disease. Left main is widely patent. The circumflex is codominant.  30% proximal circumflex, 99% first obtuse marginal, 70 to 80% proximal large second obtuse marginal.  The second obtuse marginal has high-grade distal disease in a trifurcation.  The PDA of the left circumflex contains proximal to mid 90% stenosis. RCA is nondominant and contains segmental 70% stenosis before a large right ventricular branch. Anteroapical wall motion abnormality, EF 40 to 45%, apical dyskinesis, and LVEDP 14 mmHg. RECOMMENDATIONS: Surgical anatomy.  TCTS consultation is recommended. [facility].   DG Chest Port 1 View  Result Date: 09/26/2021 CLINICAL DATA:  Pneumothorax, chest tube present EXAM: PORTABLE CHEST 1 VIEW COMPARISON:  09/25/2021 FINDINGS: No significant change in AP portable chest radiograph status post median sternotomy and CABG. Cardiomegaly. Right neck vascular catheter. Mediastinal and left-sided chest tubes. No significant pneumothorax. Mild, diffuse bilateral interstitial pulmonary opacity. IMPRESSION: 1. No significant change in AP portable chest radiograph status post median sternotomy and CABG. 2. Mediastinal and left-sided chest tubes. No significant pneumothorax. Electronically Signed   By: Delanna Ahmadi M.D.   On: 09/26/2021 09:01   DG Chest Port 1 View  Result Date: 09/25/2021 CLINICAL DATA:  75 year old male status post open heart surgery. EXAM: PORTABLE CHEST - 1 VIEW COMPARISON:  09/24/2021 FINDINGS: The mediastinal contours are within normal limits. Similar appearing cardiomegaly. Interval extubation. Mediastinal left  thoracostomy drains remain in unchanged positions. Unchanged position of indwelling right internal jugular MAC with catheter tip in the superior vena cava. Mild bibasilar subsegmental atelectasis, similar to comparison. No evidence of significant pleural effusion or evidence of pneumothorax. Median sternotomy wires remain in place. No acute osseous abnormality. IMPRESSION: Mild bibasilar subsegmental atelectasis. Support lines and tubes in unchanged, appropriate positions. Electronically Signed   By: Ruthann Cancer M.D.   On: 09/25/2021 08:44   DG Chest Port 1 View  Result Date: 09/24/2021 CLINICAL DATA:  Pneumothorax, status post CABG EXAM: PORTABLE CHEST 1 VIEW COMPARISON:  09/22/2021 FINDINGS: Interval postoperative findings of median sternotomy and CABG. Support apparatus includes endotracheal tube, tip above the carina. Right neck vascular catheter. Mediastinal and left chest drainage tubes. Tiny, less than 5% left apical pneumothorax. IMPRESSION: 1. Interval postoperative findings of median sternotomy and CABG. 2. Tiny, less than 5% left apical pneumothorax. Left-sided chest tube in position. 3. Satisfactory support apparatus. Electronically Signed   By: Delanna Ahmadi M.D.   On: 09/24/2021 14:52   ECHOCARDIOGRAM COMPLETE  Result Date: 09/19/2021    ECHOCARDIOGRAM REPORT   Patient Name:   DAMONTRE MILLEA Date of Exam: 09/19/2021 Medical Rec #:  [id number]    Height:       69.0 in Accession #:    [id number]   Weight:       187.4 lb Date of Birth:  14-May-1946    BSA:          2.010 m Patient Age:    34 years     BP:           108/59 mmHg Patient Gender: M            HR:           62 bpm. Exam Location:  Inpatient Procedure: 2D Echo, Color Doppler, Cardiac Doppler and Intracardiac            Opacification Agent Indications:    Acute ischemic heart disease  History:        Patient has prior history of Echocardiogram examinations, most                 recent 01/31/2019. TIA; Risk Factors:Diabetes, Dyslipidemia  and                 Hypertension.  Sonographer:    Maudry Mayhew MHA, RDMS, RVT, RDCS Referring Phys: [place]  Sonographer Comments: Suboptimal apical window. IMPRESSIONS  1. No left ventricular thrombus is seen with Definity contrast. Left ventricular ejection fraction, by estimation, is 45 to 50%. The left ventricle has mildly decreased function. The left ventricle demonstrates regional wall motion abnormalities (see scoring diagram/findings for description). Left ventricular diastolic parameters were normal. There is moderate hypokinesis of the left ventricular, apical septal wall, inferior wall, apical segment and anterior wall.  2. Right ventricular systolic function is normal. The right ventricular size is normal.  3. Left atrial size was mildly dilated.  4. The mitral valve is normal in structure. No evidence of mitral valve regurgitation. No evidence of mitral stenosis.  5. The aortic valve is normal in structure. There is mild calcification of the aortic valve. Aortic valve regurgitation is not visualized. Mild aortic valve sclerosis is present, with no evidence of aortic valve stenosis.  6. The inferior vena cava is normal in size with greater than 50% respiratory variability, suggesting right atrial pressure of 3 mmHg. Comparison(s): Prior images reviewed side by side. The left ventricular function is worsened. The left ventricular wall motion abnormality is new. FINDINGS  Left Ventricle: No left ventricular thrombus is seen with Definity contrast. Left ventricular ejection fraction, by estimation, is 45 to 50%. The left ventricle has mildly decreased function. The left ventricle demonstrates regional wall motion abnormalities. Moderate hypokinesis of the left ventricular, apical septal wall, inferior wall, apical segment and anterior wall. The left ventricular internal cavity size was normal in size. There is no left ventricular hypertrophy. Left ventricular diastolic parameters were  normal.  LV Wall Scoring: The apical septal segment, apical anterior segment, apical inferior segment, and apex are hypokinetic. The anterior wall, entire lateral wall, anterior septum, inferior wall, mid inferoseptal segment, and basal inferoseptal segment are normal. Right Ventricle: The right ventricular size is normal. No increase in right ventricular wall thickness. Right ventricular systolic function is normal. Left Atrium: Left atrial size was mildly dilated. Right Atrium: Right atrial size was normal in size. Pericardium: There is no evidence of pericardial effusion. Mitral Valve: The mitral valve is normal in structure. No evidence of mitral valve regurgitation. No evidence of mitral valve stenosis. Tricuspid Valve: The tricuspid valve is normal in structure. Tricuspid valve regurgitation is not demonstrated.  No evidence of tricuspid stenosis. Aortic Valve: The aortic valve is normal in structure. There is mild calcification of the aortic valve. Aortic valve regurgitation is not visualized. Mild aortic valve sclerosis is present, with no evidence of aortic valve stenosis. Aortic valve mean gradient measures 4.0 mmHg. Aortic valve peak gradient measures 8.8 mmHg. Aortic valve area, by VTI measures 1.80 cm. Pulmonic Valve: The pulmonic valve was normal in structure. Pulmonic valve regurgitation is not visualized. No evidence of pulmonic stenosis. Aorta: The aortic root is normal in size and structure. Venous: The inferior vena cava is normal in size with greater than 50% respiratory variability, suggesting right atrial pressure of 3 mmHg. IAS/Shunts: No atrial level shunt detected by color flow Doppler.  LEFT VENTRICLE PLAX 2D LVIDd:         5.30 cm   Diastology LVIDs:         4.10 cm   LV e' medial:    8.49 cm/s LV PW:         0.80 cm   LV E/e' medial:  10.8 LV IVS:        0.70 cm   LV e' lateral:   8.92 cm/s LVOT diam:     1.80 cm   LV E/e' lateral: 10.2 LV SV:         50 LV SV Index:   25 LVOT Area:      2.54 cm  RIGHT VENTRICLE RV S prime:     7.07 cm/s TAPSE (M-mode): 2.8 cm LEFT ATRIUM             Index        RIGHT ATRIUM           Index LA diam:        3.80 cm 1.89 cm/m   RA Area:     15.10 cm LA Vol (A2C):   45.2 ml 22.49 ml/m  RA Volume:   42.40 ml  21.10 ml/m LA Vol (A4C):   70.7 ml 35.18 ml/m LA Biplane Vol: 61.7 ml 30.70 ml/m  AORTIC VALVE AV Area (Vmax):    1.66 cm AV Area (Vmean):   1.66 cm AV Area (VTI):     1.80 cm AV Vmax:           148.00 cm/s AV Vmean:          95.000 cm/s AV VTI:            0.277 m AV Peak Grad:      8.8 mmHg AV Mean Grad:      4.0 mmHg LVOT Vmax:         96.40 cm/s LVOT Vmean:        62.000 cm/s LVOT VTI:          0.196 m LVOT/AV VTI ratio: 0.71  AORTA Ao Root diam: 3.10 cm MITRAL VALVE MV Area (PHT): 4.17 cm    SHUNTS MV Decel Time: 182 msec    Systemic VTI:  0.20 m MV E velocity: 91.30 cm/s  Systemic Diam: 1.80 cm MV A velocity: 99.40 cm/s MV E/A ratio:  0.92 Mihai Croitoru MD Electronically signed by Sanda Klein MD Signature Date/Time: 09/19/2021/6:13:37 PM    Final    ECHO INTRAOPERATIVE TEE  Result Date: 09/24/2021  *INTRAOPERATIVE TRANSESOPHAGEAL REPORT *  Patient Name:   KEATH MATERA   Date of Exam: 09/24/2021 Medical Rec #:  [id number]      Height:       69.0 in Accession #:  [id number]     Weight:       177.0 lb Date of Birth:  [birth year]      BSA:          1.96 m Patient Age:    33 years       BP:           158/70 mmHg Patient Gender: M              HR:           63 bpm. Exam Location:  Anesthesiology Transesophogeal exam was perform intraoperatively during surgical procedure. Patient was closely monitored under general anesthesia during the entirety of examination. Indications:     CAD Native Vessel i25.10 Sonographer:     Raquel Sarna Senior RDCS Performing Phys: [id number] Lucile Crater LIGHTFOOT Diagnosing Phys: Hoy Morn MD Complications: No known complications during this procedure. POST-OP IMPRESSIONS _ Left Ventricle: has mildly reduced systolic  function, with an ejection fraction of 50%. The cavity size was normal. The wall motion is normal. _ Right Ventricle: The right ventricle appears unchanged from pre-bypass. _ Aorta: The aorta appears unchanged from pre-bypass. _ Left Atrial Appendage: The left atrial appendage appears unchanged from pre-bypass. _ Aortic Valve: The aortic valve appears unchanged from pre-bypass. There is Trivial regurgitation. _ Tricuspid Valve: There is Trivial regurgitation. _ Pulmonic Valve: The pulmonic valve appears unchanged from pre-bypass. _ Interatrial Septum: The interatrial septum appears unchanged from pre-bypass. _ Comments: Post-bypass images reviewed with surgeon. PRE-OP FINDINGS  Left Ventricle: The left ventricle has low normal systolic function, with an ejection fraction of 50-55%. The cavity size was normal. There is no left ventricular hypertrophy. Right Ventricle: The right ventricle has normal systolic function. The cavity was normal. There is no increase in right ventricular wall thickness. Left Atrium: Left atrial size was normal in size. No left atrial/left atrial appendage thrombus was detected. Left atrial appendage velocity is reduced at less than 40 cm/s. Right Atrium: Right atrial size was normal in size. Interatrial Septum: No atrial level shunt detected by color flow Doppler. Pericardium: A small pericardial effusion is present. The pericardial effusion is localized near the right ventricle. Mitral Valve: The mitral valve is normal in structure. Mitral valve regurgitation is not visualized by color flow Doppler. Tricuspid Valve: The tricuspid valve was normal in structure. Tricuspid valve regurgitation was not visualized by color flow Doppler. Aortic Valve: The aortic valve is tricuspid Aortic valve regurgitation is trivial by color flow Doppler. There is no stenosis of the aortic valve. Pulmonic Valve: The pulmonic valve was normal in structure. Pulmonic valve regurgitation is not visualized by color  flow Doppler. Aorta: The aortic root, ascending aorta and aortic arch are normal in size and structure. Pulmonary Artery: The pulmonary artery is of normal size. +--------------+-------++ LEFT VENTRICLE        +--------------+-------++ PLAX 2D               +--------------+-------++ LVIDd:        4.10 cm +--------------+-------++ LVIDs:        3.00 cm +--------------+-------++ LV SV:        39 ml   +--------------+-------++ LV SV Index:  19.72   +--------------+-------++                       +--------------+-------++  Hoy Morn MD Electronically signed by Hoy Morn MD Signature Date/Time: 09/24/2021/1:24:02 PM    Final    VAS US DOPPLER PRE CABG  Result  Date: 09/19/2021 PREOPERATIVE VASCULAR EVALUATION Patient Name:  PRESS CASALE  Date of Exam:   09/19/2021 Medical Rec #: [id number]     Accession #:    [id number] Date of Birth: 07-13-1946     Patient Gender: M Patient Age:   49 years Exam Location:  [facility] Procedure:      VAS US DOPPLER PRE CABG Referring Phys: [place] --------------------------------------------------------------------------------  Indications:      Pre-CABG. Risk Factors:     Hypertension, hyperlipidemia, Diabetes, no history of smoking,                   prior MI, coronary artery disease, prior CVA. Other Factors:    Carotid artery stenosis. Comparison Study: Previous carotid duplex on 02/01/2019 - LT ICA 40-59% Performing Technologist: Rogelia Rohrer RVT, RDMS  Examination Guidelines: A complete evaluation includes B-mode imaging, spectral Doppler, color Doppler, and power Doppler as needed of all accessible portions of each vessel. Bilateral testing is considered an integral part of a complete examination. Limited examinations for reoccurring indications may be performed as noted.  Right Carotid Findings: +----------+-------+--------+--------+-----------------------+-----------------+           PSV    EDV cm/sStenosisDescribe                Comments                    cm/s                                                            +----------+-------+--------+--------+-----------------------+-----------------+ CCA Prox  76     10                                     intimal                                                                   thickening        +----------+-------+--------+--------+-----------------------+-----------------+ CCA Distal76     12                                     intimal                                                                   thickening        +----------+-------+--------+--------+-----------------------+-----------------+ ICA Prox  79     18              calcific and  heterogenous                             +----------+-------+--------+--------+-----------------------+-----------------+ ICA Distal59     12                                                       +----------+-------+--------+--------+-----------------------+-----------------+ ECA       169                                                             +----------+-------+--------+--------+-----------------------+-----------------+ +----------+--------+-------+--------+------------+           PSV cm/sEDV cmsDescribeArm Pressure +----------+--------+-------+--------+------------+ Subclavian73                                  +----------+--------+-------+--------+------------+ +---------+--------+--+--------+--+ VertebralPSV cm/s42EDV cm/s10 +---------+--------+--+--------+--+ Left Carotid Findings: +----------+-------+--------+--------+-----------------------+-----------------+           PSV    EDV cm/sStenosisDescribe               Comments                    cm/s                                                            +----------+-------+--------+--------+-----------------------+-----------------+  CCA Prox  73     12                                                       +----------+-------+--------+--------+-----------------------+-----------------+ CCA Distal82     14                                     intimal                                                                   thickening        +----------+-------+--------+--------+-----------------------+-----------------+ ICA Prox  124    24      40-59%  calcific and                                                              heterogenous                             +----------+-------+--------+--------+-----------------------+-----------------+  ICA Mid   128    22      40-59%  hypoechoic                               +----------+-------+--------+--------+-----------------------+-----------------+ ICA Distal82     15                                                       +----------+-------+--------+--------+-----------------------+-----------------+ ECA       159    0                                                        +----------+-------+--------+--------+-----------------------+-----------------+ +----------+--------+--------+--------+------------+ SubclavianPSV cm/sEDV cm/sDescribeArm Pressure +----------+--------+--------+--------+------------+           131                                  +----------+--------+--------+--------+------------+ +---------+--------+--+--------+--+ VertebralPSV cm/s46EDV cm/s10 +---------+--------+--+--------+--+  ABI Findings: +---------+------------------+-----+----------+--------+ Right    Rt Pressure (mmHg)IndexWaveform  Comment  +---------+------------------+-----+----------+--------+ Brachial 130                    triphasic          +---------+------------------+-----+----------+--------+ PTA      121               0.88 monophasic         +---------+------------------+-----+----------+--------+ DP       156                1.14 triphasic          +---------+------------------+-----+----------+--------+ Great Toe                       Normal             +---------+------------------+-----+----------+--------+ +---------+------------------+-----+---------+-------+ Left     Lt Pressure (mmHg)IndexWaveform Comment +---------+------------------+-----+---------+-------+ Brachial 137                    triphasic        +---------+------------------+-----+---------+-------+ PTA      123               0.90 biphasic         +---------+------------------+-----+---------+-------+ DP       120               0.88 biphasic         +---------+------------------+-----+---------+-------+ Great Toe                       Normal           +---------+------------------+-----+---------+-------+ +-------+---------------+----------------+ ABI/TBIToday's ABI/TBIPrevious ABI/TBI +-------+---------------+----------------+ Right  1.14 / 0.83                     +-------+---------------+----------------+ Left   0.90 / 0.69                     +-------+---------------+----------------+  Right Doppler Findings: +--------+--------+-----+---------+-----------------------------+ Site    PressureIndexDoppler  Comments                      +--------+--------+-----+---------+-----------------------------+  Brachial130          triphasic                              +--------+--------+-----+---------+-----------------------------+ Radial               biphasic RT radial heart cath 09/18/21 +--------+--------+-----+---------+-----------------------------+ Ulnar                triphasic                              +--------+--------+-----+---------+-----------------------------+  Left Doppler Findings: +--------+--------+-----+---------+--------+ Site    PressureIndexDoppler  Comments +--------+--------+-----+---------+--------+ [id number]          triphasic          +--------+--------+-----+---------+--------+ Radial               triphasic         +--------+--------+-----+---------+--------+ Ulnar                triphasic         +--------+--------+-----+---------+--------+  Summary: Right Carotid: The extracranial vessels were near-normal with only minimal wall                thickening or plaque. Left Carotid: Velocities in the left ICA are consistent with a 40-59% stenosis.               The CCA nad ECA were near-normal with only minimal wall thickening               or plaque. Vertebrals:  Bilateral vertebral arteries demonstrate antegrade flow. Subclavians: Normal flow hemodynamics were seen in bilateral subclavian              arteries. Right ABI: Resting right ankle-brachial index is within normal range. No evidence of significant right lower extremity arterial disease. The right toe-brachial index is normal. 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 The left toe-brachial index is normal. Right Upper Extremity: Doppler waveform obliterate with right radial compression. Doppler waveforms remain within normal limits with right ulnar compression. Left Upper Extremity: Doppler waveforms remain within normal limits with left radial compression. Doppler waveforms remain within normal limits with left ulnar compression.  Electronically signed by Orlie Pollen on 09/19/2021 at 7:52:00 PM.    Final      Discharge Medications: Allergies as of 09/28/2021       Reactions   Aricept [donepezil] Nausea Only        Medication List     STOP taking these medications    aspirin 325 MG tablet Replaced by: aspirin 81 MG EC tablet   lisinopril 10 MG tablet Commonly known as: ZESTRIL       TAKE these medications    aspirin 81 MG EC tablet Take 1 tablet (81 mg total) by mouth daily. Swallow whole. Start taking on: September 29, 2021 Replaces: aspirin 325 MG tablet   clopidogrel 75 MG  tablet Commonly known as: PLAVIX Take 1 tablet (75 mg total) by mouth daily. Start taking on: September 29, 2021   Cyanocobalamin 1000 MCG/ML Kit Inject 1,000 mcg as directed every 30 (thirty) days.   furosemide 40 MG tablet Commonly known as: LASIX Take 1 tablet (40 mg total) by mouth daily. Start taking on: September 29, 2021   metFORMIN 500 MG tablet Commonly known as: GLUCOPHAGE Take 1 tablet (500 mg total) by  mouth every morning.   metoprolol tartrate 25 MG tablet Commonly known as: LOPRESSOR Take 0.5 tablets (12.5 mg total) by mouth 2 (two) times daily.   potassium chloride SA 20 MEQ tablet Commonly known as: KLOR-CON Take 1 tablet (20 mEq total) by mouth daily. Start taking on: September 29, 2021   rosuvastatin 40 MG tablet Commonly known as: Crestor Take 1 tablet (40 mg total) by mouth daily.   traMADol 50 MG tablet Commonly known as: ULTRAM Take 1 tablet (50 mg total) by mouth every 6 (six) hours as needed for up to 7 days for moderate pain.               Durable Medical Equipment  (From admission, onward)           Start     Ordered   09/28/21 0820  For home use only DME 3 n 1  Once        09/28/21 0820           The patient has been discharged on:   1.Beta Blocker:  Yes [  x ]                              No   [   ]                              If No, reason:  2.Ace Inhibitor/ARB: Yes [   ]                                     No  [  n  ]                                     If No, reason:labile BP  3.Statin:   Yes [ y  ]                  No  [   ]                  If No, reason:  4.Ecasa:  Yes  [ x  ]                  No   [   ]                  If No, reason:  Patient had ACS upon admission:  Plavix/P2Y12 inhibitor: Yes [  y ]                                      No  [   ]   Follow Up Appointments:  Follow-up Information     Park Liter, MD Follow up on 11/08/2021.   Specialty: Cardiology Why: Appointment time is at  10:00 am Contact information: [place] [address] [phone]         Lajuana Matte, MD Follow up on 10/05/2021.   Specialty: Cardiothoracic Surgery Why: Appointment time is at 2:20 pm Contact information: [place]  [id number] [phone]  Signed: Gaspar Bidding 09/28/2021, 12:07 PM

## 2021-09-27 NOTE — Progress Notes (Addendum)
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 He is passing flatus but no bowel movement yet.  Objective: Vital signs in last 24 hours: Temp:  [97.8 F (36.6 C)-98.4 F (36.9 C)] 97.8 F (36.6 C) (10/27 0357) Pulse Rate:  [73-90] 78 (10/27 0520) Cardiac Rhythm: Normal sinus rhythm (10/27 0400) Resp:  [13-21] 16 (10/27 0520) BP: (94-138)/(62-101) 138/90 (10/27 0520) SpO2:  [95 %-100 %] 99 % (10/27 0520) Weight:  [79.4 kg] 79.4 kg (10/27 0525)  Filed Weights   09/25/21 0600 09/26/21 0500 09/27/21 0525  Weight: 82.1 kg 82.1 kg 79.4 kg    Weight change: -2.7 kg      Intake/Output from previous day: 10/26 0701 - 10/27 0700 In: 533.6 [P.O.:480; I.V.:53.6] Out: 2310 [Urine:2250; Chest Tube:60]  Intake/Output this shift: Total I/O In: 53.6 [I.V.:53.6] Out: 1100 [Urine:1100]  Current Meds: Scheduled Meds:  acetaminophen  1,000 mg Oral Q6H   Or   acetaminophen (TYLENOL) oral liquid 160 mg/5 mL  1,000 mg Per Tube Q6H   aspirin EC  81 mg Oral Daily   bisacodyl  10 mg Oral Daily   Or   bisacodyl  10 mg Rectal Daily   Chlorhexidine Gluconate Cloth  6 each Topical Daily   clopidogrel  75 mg Oral Daily   docusate sodium  200 mg Oral Daily   enoxaparin (LOVENOX) injection  30 mg Subcutaneous QHS   insulin aspart  0-24 Units Subcutaneous Q4H   insulin detemir  5 Units Subcutaneous Q12H   metoprolol tartrate  12.5 mg Oral BID   Or   metoprolol tartrate  12.5 mg Per Tube BID   pantoprazole  40  mg Oral Daily   rosuvastatin  40 mg Oral Daily   sodium chloride flush  3 mL Intravenous Q12H   sodium chloride flush  3 mL Intravenous Q12H   Continuous Infusions:  sodium chloride Stopped (09/25/21 0507)   sodium chloride     sodium chloride 10 mL/hr at 09/25/21 2114   sodium chloride     albumin human 12.5 g (09/24/21 1850)   And   sodium chloride     lactated ringers     lactated ringers Stopped (09/26/21 0940)   PRN Meds:.sodium chloride, sodium chloride, albumin human **AND** sodium chloride, dextrose, metoprolol tartrate, midazolam, morphine injection, ondansetron (ZOFRAN) IV, oxyCODONE, sodium chloride flush, sodium chloride flush, traMADol  General appearance: alert, cooperative, and no distress Neurologic: intact Heart: RRR Lungs: Slightly diminished bibasilar breath sounds Abdomen: Soft, non tender, occasional bowel sounds Extremities: SCDs in place Wound: Aquacel removed sternal wound is clean and dry. RLE  wound is clean and dry.  Lab Results: CBC: Recent Labs    09/26/21 0413 09/27/21 0405  WBC 16.0* 15.0*  HGB 12.3* 13.1  HCT 36.3* 37.3*  PLT 200 207    BMET:  Recent Labs    09/26/21 0815 09/27/21 0405  NA  130* 130*  K 4.5 4.3  CL 99 100  CO2 25 24  GLUCOSE 191* 134*  BUN 15 15  CREATININE 0.79 0.73  CALCIUM 8.1* 8.0*     CMET: Lab Results  Component Value Date   WBC 15.0 (H) 09/27/2021   HGB 13.1 09/27/2021   HCT 37.3 (L) 09/27/2021   PLT 207 09/27/2021   GLUCOSE 134 (H) 09/27/2021   CHOL 226 (H) 08/28/2021   TRIG 258 (H) 08/28/2021   HDL 28 (L) 08/28/2021   LDLCALC 150 (H) 08/28/2021   ALT 14 08/28/2021   AST 16 08/28/2021   NA 130 (L) 09/27/2021   K 4.3 09/27/2021   CL 100 09/27/2021   CREATININE 0.73 09/27/2021   BUN 15 09/27/2021   CO2 24 09/27/2021   INR 1.4 (H) 09/24/2021   HGBA1C 7.2 (H) 09/22/2021   MICROALBUR 150 06/28/2020      PT/INR:  Recent Labs    09/24/21 1347  LABPROT 16.7*  INR 1.4*    Radiology:  No results found.   Assessment/Plan: S/P Procedure(s) (LRB): CORONARY ARTERY BYPASS GRAFTING (CABG) TIMES THREE , ON PUMP, USING LEFT INTERNAL MAMMARY ARTERY AND ENDOSCOPICALLY HARVESTED RIGHT GREATER SAPHENOUS VEIN (N/A) TRANSESOPHAGEAL ECHOCARDIOGRAM (TEE) (N/A) APPLICATION OF CELL SAVER ENDOVEIN HARVEST OF GREATER SAPHENOUS VEIN (Right) CV-S/p NSTEMI. SR. On Lopressor 12.5 mg bid, Plavix 75 mg daily.  Pulmonary-on room air. CXR this am appears stable (cardiomegaly, small left pleural effusion). Encourage incentive spirometer Volume overload-will give Lasix 40 mg daily Expected post op blood loss anemia-H and H this am increased to 13.1 and 37.3 DM-CBGs 196/137/125. On Insulin. Restart Metformin closer to discharge. Pre op HGA1C 7.2. Patient lives alone. PT/OT consult  Hyponatremia-sodium remains 130 8. Transfer when bed available   Nani Skillern PA-C 09/27/2021 7:00 AM    Agree with above Awaiting floor bed [place]

## 2021-09-28 LAB — GLUCOSE, CAPILLARY
Glucose-Capillary: 143 mg/dL — ABNORMAL HIGH (ref 70–99)
Glucose-Capillary: 135 mg/dL — ABNORMAL HIGH (ref 70–99)

## 2021-09-28 LAB — BASIC METABOLIC PANEL
Anion gap: 5 (ref 5–15)
BUN: 15 mg/dL (ref 8–23)
CO2: 25 mmol/L (ref 22–32)
Calcium: 8.2 mg/dL — ABNORMAL LOW (ref 8.9–10.3)
Chloride: 103 mmol/L (ref 98–111)
Creatinine, Ser: 0.64 mg/dL (ref 0.61–1.24)
GFR, Estimated: >60 mL/min (ref >60)
Glucose, Bld: 132 mg/dL — ABNORMAL HIGH (ref 70–99)
Potassium: 4.4 mmol/L (ref 3.5–5.1)
Sodium: 133 mmol/L — ABNORMAL LOW (ref 135–145)

## 2021-09-28 MED ORDER — FUROSEMIDE 40 MG PO TABS
40.0000 mg | ORAL_TABLET | Freq: Every day | ORAL | 0 refills | Status: DC
Start: 2021-09-29 — End: 2021-11-02

## 2021-09-28 MED ORDER — TRAMADOL HCL 50 MG PO TABS: 50.0000 mg | ORAL_TABLET | Freq: Four times a day (QID) | ORAL | 0 refills | Status: AC | PRN

## 2021-09-28 MED ORDER — METOPROLOL TARTRATE 25 MG PO TABS: 12.5000 mg | ORAL_TABLET | Freq: Two times a day (BID) | ORAL | 1 refills | Status: AC

## 2021-09-28 MED ORDER — POTASSIUM CHLORIDE CRYS ER 20 MEQ PO TBCR: 20.0000 meq | EXTENDED_RELEASE_TABLET | Freq: Every day | ORAL | 0 refills | Status: DC

## 2021-09-28 MED ORDER — ASPIRIN 81 MG PO TBEC: 81.0000 mg | DELAYED_RELEASE_TABLET | Freq: Every day | ORAL | Status: DC

## 2021-09-28 MED ORDER — CLOPIDOGREL BISULFATE 75 MG PO TABS: 75.0000 mg | ORAL_TABLET | Freq: Every day | ORAL | 1 refills | Status: DC

## 2021-09-28 MED ORDER — METFORMIN HCL 500 MG PO TABS
500.0000 mg | ORAL_TABLET | Freq: Every day | ORAL | Status: DC
  Administered 2021-09-28: 500 mg via ORAL
  Filled 2021-09-28: qty 1

## 2021-09-28 NOTE — TOC Transition Note (Signed)
Transition of Care [facility]) - CM/SW Discharge Note   Patient Details  Name: Jon Nelson MRN: [id number] Date of Birth: 1946-08-27  [facility] CM/SW Contact:  Zenon Mayo, RN Phone Number: 09/28/2021, 12:14 PM   Clinical Narrative:    Patient is for dc today, he states he has a 3 n 1 at home. Pt states he needs no follow up.  NCM contacted [name], sister n law, she will transport him home and she states she will keep a close watch on him, she is his POA since her sister passed.   Final next level of care: Home/Self Care Barriers to Discharge: No Barriers Identified   Patient Goals and CMS Choice Patient states their goals for this hospitalization and ongoing recovery are:: return home   Choice offered to / list presented to : NA  Discharge Placement                       Discharge Plan and Services                  DME Agency: NA       HH Arranged: NA          Social Determinants of Health (SDOH) Interventions     Readmission Risk Interventions No flowsheet data found.

## 2021-09-28 NOTE — Progress Notes (Signed)
Physical Therapy Treatment & Discharge Patient Details Name: Jon Nelson MRN: [id number] DOB: 09/09/1946 Today's Date: 09/28/2021   History of Present Illness Pt is a 75 y.o. male presenting 09/18/21 with R arm pain; workup for NSTEMI. 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 PMH includes DM II, HLD, HTN, TIA, carotid artery stenosis.   PT Comments    Pt progressing with mobility. Today's session focused on ambulation and higher level balance tasks without DME, pt moving well without overt instability or LOB. Dynamic Gait Index score of 20/24 does not indicate risk for falls with higher level balance activities. Reviewed educ re: sternal precautions, positioning, activity recommendations, fall risk reduction. Pt preparing for d/c home today, reports no further questions or concerns. Will d/c acute PT.  HR up to 107 with ambulation SpO2 95% on RA with ambulation Post-mobility BP 139/84    Recommendations for follow up therapy are one component of a multi-disciplinary discharge planning process, led by the attending physician.  Recommendations may be updated based on patient status, additional functional criteria and insurance authorization.  Follow Up Recommendations  No PT follow up     Assistance Recommended at Discharge PRN  Equipment Recommendations  3in1 (PT)    Recommendations for Other Services       Precautions / Restrictions Precautions Precautions: Sternal Precaution Comments: reviewed verbally Restrictions Weight Bearing Restrictions: Yes (sternal precautions)     Mobility  Bed Mobility Overal bed mobility: Modified Independent                  Transfers Overall transfer level: Independent Equipment used: None               General transfer comment: able to stand without UE support    Ambulation/Gait Ambulation/Gait assistance: Modified independent (Device/Increase time) (increased time) Gait Distance (Feet): 500 Feet Assistive device: None Gait  Pattern/deviations: Step-through pattern;Decreased stride length     General Gait Details: Slow, steady gait without DME, mod indep for increased time; no overt instability or LOB with higher level balance activities   Stairs             Wheelchair Mobility    Modified Rankin (Stroke Patients Only)       Balance Overall balance assessment: Modified Independent   Sitting balance-Leahy Scale: Good       Standing balance-Leahy Scale: Good                 High Level Balance Comments: Able to pick up object from floor without UE support Standardized Balance Assessment Standardized Balance Assessment : Dynamic Gait Index   Dynamic Gait Index Level Surface: Normal Change in Gait Speed: Mild Impairment Gait with Horizontal Head Turns: Normal Gait with Vertical Head Turns: Normal Gait and Pivot Turn: Normal Step Over Obstacle: Mild Impairment Step Around Obstacles: Mild Impairment Steps: Mild Impairment Total Score: 20      Cognition Arousal/Alertness: Awake/alert Behavior During Therapy: WFL for tasks assessed/performed Overall Cognitive Status: Within Functional Limits for tasks assessed                                          Exercises      General Comments General comments (skin integrity, edema, etc.): HR 105 with ambulation, SpO2 95% on RA, post-ambulation BP 139/84      Pertinent Vitals/Pain Pain Assessment: Faces Faces Pain Scale: Hurts a little bit Pain Location: sternal  incision Pain Descriptors / Indicators: Sore Pain Intervention(s): Monitored during session    Home Living                          Prior Function            PT Goals (current goals can now be found in the care plan section) Progress towards PT goals: Goals met/education completed, patient discharged from PT    Frequency    Min 3X/week      PT Plan Current plan remains appropriate    Co-evaluation              AM-PAC PT  "6 Clicks" Mobility   Outcome Measure  Help needed turning from your back to your side while in a flat bed without using bedrails?: None Help needed moving from lying on your back to sitting on the side of a flat bed without using bedrails?: None Help needed moving to and from a bed to a chair (including a wheelchair)?: None Help needed standing up from a chair using your arms (e.g., wheelchair or bedside chair)?: None Help needed to walk in hospital room?: None Help needed climbing 3-5 steps with a railing? : A Little 6 Click Score: 23    End of Session Equipment Utilized During Treatment: Gait belt Activity Tolerance: Patient tolerated treatment well Patient left: in bed;with call bell/phone within reach Nurse Communication: Mobility status PT Visit Diagnosis: Other abnormalities of gait and mobility (R26.89);Unsteadiness on feet (R26.81)     Time: 1753-0104 PT Time Calculation (min) (ACUTE ONLY): 15 min  Charges:  $Gait Training: 8-22 mins                     Mabeline Caras, PT, DPT Acute Rehabilitation Services  Pager [phone] Office [place] 09/28/2021, 10:30 AM

## 2021-09-28 NOTE — Care Management Important Message (Signed)
Important Message  Patient Details  Name: Jon Nelson MRN: [id number] Date of Birth: October 17, 1946   Medicare Important Message Given:  Yes     Shelda Altes 09/28/2021, 10:44 AM

## 2021-09-28 NOTE — Progress Notes (Signed)
Pt sts he walked recently independently. Assisted pt lying down, no assist needed, just verbal reminders for sternal precautions. Discussed with pt IS (2000 ml), sternal precautions, diet, exercise, and CRPII. Pt receptive. His family will be present. Will refer to [facility].  [phone] Yves Dill CES, ACSM 10:22 AM 09/28/2021

## 2021-09-28 NOTE — Progress Notes (Signed)
D/C instructions given to patient. Wound care and medications reviewed. All questions answered. IV removed, clean and intact. Sister in law to escort pt home.  Clyde Canterbury, RN

## 2021-09-28 NOTE — Progress Notes (Signed)
Mobility Specialist: Progress Note   09/28/21 1422  Mobility  Activity Ambulated in hall  Level of Assistance Standby assist, set-up cues, supervision of patient - no hands on  Assistive Device None  Distance Ambulated (ft) 470 ft  Mobility Ambulated independently in hallway  Mobility Response Tolerated well  Mobility performed by Mobility specialist  $Mobility charge 1 Mobility   Post-Mobility: 84 HR  Pt asleep upon entering room but easily awakened. Pt independent to sit EOB as well as to stand. Pt has no c/o during ambulation. Pt back to bed after walk with call bell at his side.   [facility] Earlene Bjelland Mobility Specialist Mobility Specialist Phone: [phone]

## 2021-09-28 NOTE — Progress Notes (Addendum)
[address]       [place], [address]             [phone]      4 Days Post-Op Procedure(s) (LRB): CORONARY ARTERY BYPASS GRAFTING (CABG) TIMES THREE , ON PUMP, USING LEFT INTERNAL MAMMARY ARTERY AND ENDOSCOPICALLY HARVESTED RIGHT GREATER SAPHENOUS VEIN (N/A) TRANSESOPHAGEAL ECHOCARDIOGRAM (TEE) (N/A) APPLICATION OF CELL SAVER ENDOVEIN HARVEST OF GREATER SAPHENOUS VEIN (Right) Subjective: Feels pretty well overall  Objective: Vital signs in last 24 hours: Temp:  [97.5 F (36.4 C)-98.5 F (36.9 C)] 98.2 F (36.8 C) (10/28 0425) Pulse Rate:  [81-91] 91 (10/28 0425) Cardiac Rhythm: Normal sinus rhythm (10/27 2050) Resp:  [15-22] 17 (10/28 0425) BP: (99-139)/(57-87) 99/59 (10/28 0425) SpO2:  [98 %-100 %] 98 % (10/28 0425) Weight:  [81.6 kg] 81.6 kg (10/28 0600)  Hemodynamic parameters for last 24 hours:    Intake/Output from previous day: 10/27 0701 - 10/28 0700 In: 750 [P.O.:750] Out: 1250 [Urine:1250] Intake/Output this shift: No intake/output data recorded.  General appearance: alert, cooperative, and no distress Heart: regular rate and rhythm Lungs: min dim in bases Abdomen: benign Extremities: no edema or calf tenderness Wound: incis healing well  Lab Results: Recent Labs    09/26/21 0413 09/27/21 0405  WBC 16.0* 15.0*  HGB 12.3* 13.1  HCT 36.3* 37.3*  PLT 200 207   BMET:  Recent Labs    09/27/21 0405 09/28/21 0200  NA 130* 133*  K 4.3 4.4  CL 100 103  CO2 24 25  GLUCOSE 134* 132*  BUN 15 15  CREATININE 0.73 0.64  CALCIUM 8.0* 8.2*    PT/INR: No results for input(s): LABPROT, INR in the last 72 hours. ABG    Component Value Date/Time   PHART 7.383 09/24/2021 1852   HCO3 21.3 09/24/2021 1852   TCO2 22 09/24/2021 1852   ACIDBASEDEF 3.0 (H) 09/24/2021 1852   O2SAT 99.0 09/24/2021 1852   CBG (last 3)  Recent Labs    09/27/21 1635 09/27/21 2005 09/28/21 0613  GLUCAP 204* 184* 143*    Meds Scheduled Meds:   acetaminophen  1,000 mg Oral Q6H   Or   acetaminophen (TYLENOL) oral liquid 160 mg/5 mL  1,000 mg Per Tube Q6H   aspirin EC  81 mg Oral Daily   bisacodyl  10 mg Oral Daily   Or   bisacodyl  10 mg Rectal Daily   Chlorhexidine Gluconate Cloth  6 each Topical Daily   clopidogrel  75 mg Oral Daily   docusate sodium  200 mg Oral Daily   enoxaparin (LOVENOX) injection  30 mg Subcutaneous QHS   furosemide  40 mg Oral Daily   insulin aspart  0-24 Units Subcutaneous TID WC & HS   insulin detemir  5 Units Subcutaneous Q12H   metoprolol tartrate  12.5 mg Oral BID   Or   metoprolol tartrate  12.5 mg Per Tube BID   pantoprazole  40 mg Oral Daily   potassium chloride  20 mEq Oral Daily   rosuvastatin  40 mg Oral Daily   sodium chloride flush  3 mL Intravenous Q12H   sodium chloride flush  3 mL Intravenous Q12H   Continuous Infusions:  sodium chloride Stopped (09/25/21 0507)   sodium chloride     sodium chloride 10 mL/hr at 09/25/21 2114   sodium chloride     albumin human 12.5 g (09/24/21 1850)   And   sodium chloride  lactated ringers     lactated ringers Stopped (09/26/21 0940)   PRN Meds:.sodium chloride, sodium chloride, albumin human **AND** sodium chloride, dextrose, metoprolol tartrate, midazolam, morphine injection, ondansetron (ZOFRAN) IV, oxyCODONE, sodium chloride flush, sodium chloride flush, traMADol  Xrays DG Chest 2 View  Result Date: 09/27/2021 CLINICAL DATA:  Pleural effusion. EXAM: CHEST - 2 VIEW COMPARISON:  Chest radiograph, 09/26/2021 and 09/25/2021. FINDINGS: Support lines: Interval removal of RIGHT IJ CVC. Cardiac recorder. Overlying pacer leads. Cardiomediastinal silhouette is unchanged. Coronary bypass. The lungs clear. No focal consolidation or mass. Trace residual RIGHT pleural effusion. No pneumothorax. No interval osseous abnormality. IMPRESSION: 1. Removal of RIGHT IJ CVC. 2. Trace residual RIGHT pleural effusion. Electronically Signed   By: Michaelle Birks  M.D.   On: 09/27/2021 07:39    Assessment/Plan: S/P Procedure(s) (LRB): CORONARY ARTERY BYPASS GRAFTING (CABG) TIMES THREE , ON PUMP, USING LEFT INTERNAL MAMMARY ARTERY AND ENDOSCOPICALLY HARVESTED RIGHT GREATER SAPHENOUS VEIN (N/A) TRANSESOPHAGEAL ECHOCARDIOGRAM (TEE) (N/A) APPLICATION OF CELL SAVER ENDOVEIN HARVEST OF GREATER SAPHENOUS VEIN (Right) POD #4 1 afeb, VSS , sinus rhythm, occ PVC 2 sats good on RA 3 adeq UOP, weight pretty stable about 1.5 kd> preop- normal renal function 4 BS fair control- will restart metformin with normal renal fxn 5 cont routine rehab and pulm toilet 6 Poss home later today or in am if can get in touch with sister in law who hopefully can stay with him a short time  LOS: 10 days    John Giovanni PA-C Pager [phone] 09/28/2021

## 2021-10-05 ENCOUNTER — Ambulatory Visit (INDEPENDENT_AMBULATORY_CARE_PROVIDER_SITE_OTHER): Payer: Self-pay | Admitting: Thoracic Surgery (Cardiothoracic Vascular Surgery)

## 2021-10-05 ENCOUNTER — Other Ambulatory Visit: Payer: Self-pay

## 2021-10-05 DIAGNOSIS — Z951 Presence of aortocoronary bypass graft: Secondary | ICD-10-CM

## 2021-10-05 NOTE — Progress Notes (Signed)
     [address]       [place] [address]             [phone]       Patient: Home Provider: Office Consent for Telemedicine visit obtained.  Today's visit was completed via a real-time telehealth (see specific modality noted below). The patient/authorized person provided oral consent at the time of the visit to engage in a telemedicine encounter with the present provider at [facility]. The patient/authorized person was informed of the potential benefits, limitations, and risks of telemedicine. The patient/authorized person expressed understanding that the laws that protect confidentiality also apply to telemedicine. The patient/authorized person acknowledged understanding that telemedicine does not provide emergency services and that he or she would need to call 911 or proceed to the nearest hospital for help if such a need arose.   Total time spent in the clinical discussion 10 minutes.  Telehealth Modality: Phone visit (audio only)  I had a telephone visit with Mr. Bukhari.  He is s/p CABG.  He only complains of a mild cough.  All of his vitals have been good.  He will follow-up in 1 months with a CXR in 1 months in [facility].  If clear, he can be discharged to cardiac rehab.  Romeo Zielinski Bary Leriche

## 2021-10-08 NOTE — Progress Notes (Unsigned)
Patient ID: Jon Nelson                 DOB: 1945/12/12                    MRN: [id number]     HPI: Jon Nelson is a 75 y.o. male patient referred to lipid clinic by ***. PMH is significant for recent NSTEMI 09/2021 s/p CABG x3, carotid artery stenosis, T2DM, HLD, HTN, stroke. It appears the patient was previously on atorvastatin 80 mg and switched to rosuvastatin 40 mg 06/2020 after labs showed LDL still elevated. At next lab draw 08/2021 patient reported not taking rosuvastatin but at hospital admission he reported he has resumed this.   Today, *** -referred by wayne gold? Wrote discharge summary -taking rosuva? Recheck lipids after being on rosuva or just start pcsk9? -ask about fhx, don't see in chart -diet/exercise -PCSK9i - couldn't find 2022 plan on q48[employer] but do see 2023 - $150 deductible, praluent tier 3 ($47/30DS, 141/90DS), repatha and nexletol not covered  Current Medications: rosuvastatin 40 mg daily Intolerances: *** Risk Factors: ASCVD, TIA, T2DM, HLD, HTN LDL goal: <55 mg/dL  Diet:   Exercise:   Family History:   Social History: Never smoker  Labs: -08/28/21: TC 226, TG 258, HDL 28, LDL 150 (prescribed rosuvastatin 40 mg but not taking) -06/28/20: TC 224, TG 299, HDL 26, LDL 143 (atorvastatin 80 mg)  Past Medical History:  Diagnosis Date   Carotid artery stenosis    [facility]    GI bleed    HLD (hyperlipidemia)    HTN (hypertension)    Other amnesia    TIA (transient ischemic attack)     Current Outpatient Medications on File Prior to Visit  Medication Sig Dispense Refill   aspirin EC 81 MG EC tablet Take 1 tablet (81 mg total) by mouth daily. Swallow whole.     clopidogrel (PLAVIX) 75 MG tablet Take 1 tablet (75 mg total) by mouth daily. 30 tablet 1   Cyanocobalamin 1000 MCG/ML KIT Inject 1,000 mcg as directed every 30 (thirty) days.     furosemide (LASIX) 40 MG tablet Take 1 tablet (40 mg total) by mouth daily. 5 tablet 0   metFORMIN  (GLUCOPHAGE) 500 MG tablet Take 1 tablet (500 mg total) by mouth every morning. 90 tablet 2   metoprolol tartrate (LOPRESSOR) 25 MG tablet Take 0.5 tablets (12.5 mg total) by mouth 2 (two) times daily. 30 tablet 1   potassium chloride SA (KLOR-CON) 20 MEQ tablet Take 1 tablet (20 mEq total) by mouth daily. 5 tablet 0   rosuvastatin (CRESTOR) 40 MG tablet Take 1 tablet (40 mg total) by mouth daily. 90 tablet 1   No current facility-administered medications on file prior to visit.    Allergies  Allergen Reactions   Aricept [Donepezil] Nausea Only    Assessment/Plan:  1. Hyperlipidemia -

## 2021-10-09 ENCOUNTER — Ambulatory Visit: Payer: Medicare HMO

## 2021-10-10 ENCOUNTER — Telehealth (HOSPITAL_COMMUNITY): Payer: Self-pay

## 2021-10-10 NOTE — Telephone Encounter (Signed)
Per phase I cardiac rehab, fax cardiac rehab referral to [facility]. °

## 2021-11-02 ENCOUNTER — Other Ambulatory Visit: Payer: Self-pay

## 2021-11-02 ENCOUNTER — Ambulatory Visit: Payer: Medicare HMO | Admitting: Pharmacist

## 2021-11-02 ENCOUNTER — Other Ambulatory Visit: Payer: Self-pay | Admitting: Thoracic Surgery (Cardiothoracic Vascular Surgery)

## 2021-11-02 VITALS — BP 104/62 | HR 81

## 2021-11-02 DIAGNOSIS — Z951 Presence of aortocoronary bypass graft: Secondary | ICD-10-CM

## 2021-11-02 DIAGNOSIS — E782 Mixed hyperlipidemia: Secondary | ICD-10-CM | POA: Diagnosis not present

## 2021-11-02 NOTE — Progress Notes (Signed)
Patient ID: Jon Nelson                 DOB: January 25, 1946                    MRN: [id number]     HPI: Jon Nelson is a 75 y.o. male patient referred to lipid clinic by Jon Pierini, PA. PMH is significant for CAD s/p NSTEMI 09/17/21. Cath showed severe 3 vessel disease with reduced EF in the 45% range. He underwent CABG x3 09/24/21. 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 [facility] be [facility] with [facility] in [place], sees Dr Jon Nelson next week.  Pt presents today for follow up. Brings in all home medications with a few discrepancies noted. He has a meloxicam 63m daily rx and has been taking lisinopril 547mdaily (was advised to d/c lisinopril 1057mt hospital discharge due to labile BP). He complains of a cough today. Did have lisinopril on his med list prior to surgery, however he was not very adherent with his meds. Did not start taking his rosuvastatin every day until after his CABG. Has tried to cut back on red meat since then and eat more vegetables. Has not heard about cardiac rehab starting yet. Took his 5 day course of furosemide and K at hospital discharge, reports no LE edema today.  Current Medications: rosuvastatin 9m41mily Risk Factors: CAD s/p NSTEMI and CABG, DM, stroke, HTN, age LDL goal: <55mg47m Diet: trying to eat more vegetables and less sausage/burgers  Exercise: hasn't started cardiac rehab yet, trying to walk some  Family History: Mother with stroke  Social History: Denies tobacco, alcohol, and drug use.  Labs: 08/28/21: TC 226, TG 258, HDL 28, LDL 150 (nonadherent to rosuvastatin 9mg 64my) 06/28/20: TC 224, TG 299, HDL 26, LDL 143 (reported adherence to atorvastatin 80mg d46m but unlikely based on labs) 01/31/19: TC 120, TG 137, HDL 22, LDL 71 (atorvastatin 10mg da33m  Past Medical History:  Diagnosis Date   Carotid artery stenosis    Diabetes (H[facility])    [place]    HLD (hyperlipidemia)    HTN (hypertension)     Other amnesia    TIA (transient ischemic attack)     Current Outpatient Medications on File Prior to Visit  Medication Sig Dispense Refill   aspirin EC 81 MG EC tablet Take 1 tablet (81 mg total) by mouth daily. Swallow whole.     clopidogrel (PLAVIX) 75 MG tablet Take 1 tablet (75 mg total) by mouth daily. 30 tablet 1   Cyanocobalamin 1000 MCG/ML KIT Inject 1,000 mcg as directed every 30 (thirty) days.     furosemide (LASIX) 40 MG tablet Take 1 tablet (40 mg total) by mouth daily. 5 tablet 0   metFORMIN (GLUCOPHAGE) 500 MG tablet Take 1 tablet (500 mg total) by mouth every morning. 90 tablet 2   metoprolol tartrate (LOPRESSOR) 25 MG tablet Take 0.5 tablets (12.5 mg total) by mouth 2 (two) times daily. 30 tablet 1   potassium chloride SA (KLOR-CON) 20 MEQ tablet Take 1 tablet (20 mEq total) by mouth daily. 5 tablet 0   rosuvastatin (CRESTOR) 40 MG tablet Take 1 tablet (40 mg total) by mouth daily. 90 tablet 1   No current facility-administered medications on file prior to visit.    Allergies  Allergen Reactions   Aricept [Donepezil] Nausea Only    Assessment/Plan:  1. Hyperlipidemia - Baseline LDL 150mg ear24m this year, pt not  previously adherent to his rosuvastatin 52m daily or his other medications. Has now been taking on a regular basis since his CABG. His LDL goal is < 55 given CABG, stroke, and DM. Rechecking updated lipids today with improved statin adherence. Depending on how far he is away from his goal, will plan to add either Zetia 115mdaily or Praluent 7531m2W (preferred PCSK9i on his formulary - $45/month, confirmed copay is ok with pt). Will call pt when labs result to finalize med plan.  2. Med rec - pt advised to stop daily meloxicam due to his CAD and increased bleeding risk since he's also on Plavix. Advised him to take Tylenol as needed for pain. Also advised pt to stop his lisinopril 5mg64mily, was not supposed to continue this after recent hospitalization due to  labile BP. His BP is low today at 104/62 and he also has a dry cough. Hopefully both will improve off of therapy.  Jon Nelson E. Danyetta Gillham, PharmD, BCACP, CPP [id number] [id number]: [phone]: ([phone]/2022 1:50 PM  Addendum: lab tech unable to collect blood. [facility]

## 2021-11-02 NOTE — Patient Instructions (Signed)
Your baseline LDL cholesterol was 150. We will recheck it today since you have been taking your rosuvastatin (Crestor) more consistently. Please keep taking this every day. Your LDL goal is < 55. Depending on how your labs come back today, we will plan to start either: -Ezetimibe (Zetia) 10mg  - 1 tablet daily. Lowers your LDL by 20% -Praluent 75mg  injection every 2 weeks. Lowers your LDL by 60%  Stop taking your lisinopril. This should help increase your blood pressure a bit and help with your dry cough if it's coming from a medication  Stop taking meloxicam. It's an NSAID and can increase the risk of heart disease and bleeding  Keep your follow up appointments next week

## 2021-11-05 NOTE — Progress Notes (Signed)
      [address]       [place] [address]             [phone]       HPI: This is a 75 year old male with a past medical history of diabetes mellitus, carotid artery stenosis, hyperlipidemia, hypertension, TIA, GI bleed who was found to have a NSTEMI. He then underwent a CABG x 3 by Dr. Kipp Brood on 09/24/2021. Patient's only complaints are some fatigue and dry cough. He notes Lisinopril was recently stopped but he still has a cough. He denies shortness of breath, fever, or chills.  Current Outpatient Medications  Medication Sig Dispense Refill   acetaminophen (TYLENOL) 500 MG tablet Take 500 mg by mouth every 6 (six) hours as needed.     aspirin EC 81 MG EC tablet Take 1 tablet (81 mg total) by mouth daily. Swallow whole.     clopidogrel (PLAVIX) 75 MG tablet Take 1 tablet (75 mg total) by mouth daily. 30 tablet 1   metFORMIN (GLUCOPHAGE) 500 MG tablet Take 1 tablet (500 mg total) by mouth every morning. 90 tablet 2   metoprolol tartrate (LOPRESSOR) 25 MG tablet Take 0.5 tablets (12.5 mg total) by mouth 2 (two) times daily. 30 tablet 1   rosuvastatin (CRESTOR) 40 MG tablet Take 1 tablet (40 mg total) by mouth daily. 90 tablet 1  Vital Signs: Vitals:   11/06/21 1123  BP: 112/75  Pulse: 100  Resp: 20  SpO2: 96%    Physical Exam: CV-Slightly tachycardic Pulmonary-Clear to auscultation bilaterally Abdomen-Soft, non tender, bowel sounds present Extremities-No LE edema Wounds-Sternal and RLE wounds are clean, dry, well healed, no sign of infection  Diagnostic Tests:   CLINICAL DATA:  s/p cabg x 3   EXAM: CHEST - 2 VIEW   COMPARISON:  September 27, 2021.   FINDINGS: No consolidation. No visible pleural effusions or pneumothorax. Cardiomediastinal silhouette is within normal limits and similar to prior. CABG. Loop recorder projects over the left chest.   IMPRESSION: No evidence of acute cardiopulmonary disease.     Electronically Signed   By:  Margaretha Sheffield M.D.   On: 11/06/2021 10:58    Impression and Plan: Overall, Mr. Kardell is progressing well from coronary artery bypass grafting surgery. We discussed the patient was instructed to continue with sternal precautions (I.e. no lifting more than 10 pounds) for the next 2 weeks. As long as the patient is NOT taking any narcotic for pain, he may begin driving short distances (I.e. 30 minutes or less during the day) and may gradually increase the frequency and duration as tolerates. Of note, patient has already been driving as not taking any pain pill. We encourage the patient to participate in cardiac rehab;he does wish to do so so he will be referred to [facility]. Unsure of the etiology of his cough, since Lisinopril has been stopped.  Patient has an appointment to see Dr. Agustin Cree on 11/08/2021. He will return to [facility]  Nani Skillern, PA-C [facility] [phone]

## 2021-11-06 ENCOUNTER — Ambulatory Visit (INDEPENDENT_AMBULATORY_CARE_PROVIDER_SITE_OTHER): Payer: Self-pay | Admitting: Physician Assistant

## 2021-11-06 ENCOUNTER — Other Ambulatory Visit: Payer: Self-pay

## 2021-11-06 ENCOUNTER — Ambulatory Visit
Admission: RE | Admit: 2021-11-06 | Discharge: 2021-11-06 | Disposition: A | Discharge status: Home / Self Care | Payer: Medicare HMO | Source: Ambulatory Visit | Attending: Thoracic Surgery (Cardiothoracic Vascular Surgery) | Admitting: Thoracic Surgery (Cardiothoracic Vascular Surgery)

## 2021-11-06 VITALS — BP 112/75 | HR 100 | Resp 20 | Ht 69.0 in | Wt 174.0 lb

## 2021-11-06 DIAGNOSIS — Z951 Presence of aortocoronary bypass graft: Secondary | ICD-10-CM

## 2021-11-06 DIAGNOSIS — I6529 Occlusion and stenosis of unspecified carotid artery: Secondary | ICD-10-CM | POA: Insufficient documentation

## 2021-11-06 DIAGNOSIS — K922 Gastrointestinal hemorrhage, unspecified: Secondary | ICD-10-CM | POA: Insufficient documentation

## 2021-11-06 DIAGNOSIS — G459 Transient cerebral ischemic attack, unspecified: Secondary | ICD-10-CM | POA: Insufficient documentation

## 2021-11-06 DIAGNOSIS — I2510 Atherosclerotic heart disease of native coronary artery without angina pectoris: Secondary | ICD-10-CM

## 2021-11-06 NOTE — Patient Instructions (Signed)
You are encouraged to enroll and participate in the outpatient cardiac rehab program beginning as soon as practical.   Make every effort to keep your diabetes under very tight control.  Follow up closely with your primary care physician or endocrinologist and strive to keep their hemoglobin A1c levels as low as possible, preferably near or below 6.0.  The long term benefits of strict control of diabetes are far reaching and critically important for your overall health and survival.   You may return to driving an automobile as long as you are no longer requiring oral narcotic pain relievers during the daytime.  It would be wise to start driving only short distances during the daylight and gradually increase from there as you feel comfortable.   You may continue to gradually increase your physical activity as tolerated.  Refrain from any heavy lifting or strenuous use of your arms and shoulders until at least 8 weeks from the time of your surgery, and avoid activities that cause increased pain in your chest on the side of your surgical incision.  Otherwise you may continue to increase activities without any particular limitations.  Increase the intensity and duration of physical activity gradually.

## 2021-11-08 ENCOUNTER — Ambulatory Visit: Payer: Medicare HMO | Admitting: Cardiology

## 2021-11-12 ENCOUNTER — Telehealth: Payer: Self-pay | Admitting: Pharmacist

## 2021-11-12 DIAGNOSIS — E782 Mixed hyperlipidemia: Secondary | ICD-10-CM

## 2021-11-12 NOTE — Telephone Encounter (Signed)
Called pt to coordinate labs. I saw him 11/02/21 for lipid visit. He had previously not been adherent to his statin before his NSTEMI in October but reported compliance after. Needed to check updated lipid panel but our lab was unable to collect sample due to difficulty with the draw. Planned to have pt go for labs at his appt with Dr Agustin Cree the following week but pt did not show for appt and this has been rescheduled for next month now.  Called pt and asked for him to go to [facility] in [place] in the next week for fasting labs. He verbalized understanding. Need updated lipid panel to determine if/what additional lipid lowering therapy will be needed. His LDL goal is < 55 given hx of CABG, stroke, and DM.

## 2021-11-20 NOTE — Telephone Encounter (Signed)
Pt has not gone for fasting labs yet. Called to remind him again, no answer on cell and VM not set up, his mother-in-law answered and stated he did not live there but that she would pass along the message.

## 2021-11-27 DIAGNOSIS — E782 Mixed hyperlipidemia: Secondary | ICD-10-CM | POA: Diagnosis not present

## 2021-11-28 LAB — HEPATIC FUNCTION PANEL
ALT: 17 IU/L (ref 0–44)
AST: 18 IU/L (ref 0–40)
Albumin: 4 g/dL (ref 3.7–4.7)
Alkaline Phosphatase: 133 IU/L — ABNORMAL HIGH (ref 44–121)
Bilirubin Total: 0.3 mg/dL (ref 0.0–1.2)
Bilirubin, Direct: <0.1 mg/dL (ref 0.00–0.40)
Total Protein: 6 g/dL (ref 6.0–8.5)

## 2021-11-28 LAB — LIPID PANEL
Chol/HDL Ratio: 3.9 ratio (ref 0.0–5.0)
Cholesterol, Total: 122 mg/dL (ref 100–199)
HDL: 31 mg/dL — ABNORMAL LOW (ref >39)
LDL Chol Calc (NIH): 68 mg/dL (ref 0–99)
Triglycerides: 129 mg/dL (ref 0–149)
VLDL Cholesterol Cal: 23 mg/dL (ref 5–40)

## 2021-12-04 DIAGNOSIS — C44629 Squamous cell carcinoma of skin of left upper limb, including shoulder: Secondary | ICD-10-CM | POA: Diagnosis not present

## 2021-12-04 DIAGNOSIS — C44321 Squamous cell carcinoma of skin of nose: Secondary | ICD-10-CM | POA: Diagnosis not present

## 2021-12-04 DIAGNOSIS — L570 Actinic keratosis: Secondary | ICD-10-CM | POA: Diagnosis not present

## 2021-12-07 ENCOUNTER — Ambulatory Visit: Payer: Medicare HMO | Admitting: Cardiology

## 2021-12-10 ENCOUNTER — Telehealth: Payer: Self-pay

## 2021-12-10 NOTE — Telephone Encounter (Signed)
Tried calling patient. No answer and no voicemail set up for me to leave a message. 

## 2021-12-10 NOTE — Telephone Encounter (Signed)
-----   Message from Gita Kudo, RN sent at 12/04/2021  8:07 AM EST -----  ----- Message ----- From: Park Liter, MD Sent: 11/28/2021   9:43 PM EST To: Truddie Hidden, RN  Cholesterol perfect, liver function test normal except alkaline phosphatase which is mildly elevated much less than it was 2 years ago.  Continue present management but please repeat liver function test in a month

## 2021-12-11 DIAGNOSIS — C44629 Squamous cell carcinoma of skin of left upper limb, including shoulder: Secondary | ICD-10-CM | POA: Diagnosis not present

## 2021-12-26 ENCOUNTER — Encounter: Payer: Self-pay | Admitting: Pharmacist

## 2021-12-26 DIAGNOSIS — Z79899 Other long term (current) drug therapy: Secondary | ICD-10-CM

## 2021-12-26 NOTE — Progress Notes (Signed)
[facility])                                            [place] Team    12/26/2021  Jon Nelson 1946/10/12 [id number]   Per review of chart and payor information, patient has a diagnosis of clinical ASCVD but is not currently prescribed a statin.  Noted patient has history of statin intolerances due to not having a statin filled for the current calendar year.  Rosuvastatin 40 mg was filled 09/29/21 and will need to be filled at the end of the month.  He appears to have one more refill of Rosuvastatin before 08/28/22.  Please consider renewing the Rosuvastatin prescription during his upcoming PCP visit on 12/28/21.  If the patient is experiencing statin intolerance, consider coding for past intolerance (required annually) or decreasing the therapy to a lower intensity statin.   Patients with intolerance to statin therapy for reasons below can be coded for exclusion from Statin Use in Persons with Cardiovascular Disease (SPC) measure.    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  Elayne Guerin, PharmD, [place] Clinical Pharmacist [phone]

## 2021-12-28 ENCOUNTER — Ambulatory Visit: Payer: Medicare HMO | Admitting: Family Medicine

## 2021-12-31 ENCOUNTER — Ambulatory Visit: Payer: Medicare HMO | Admitting: Cardiology

## 2021-12-31 ENCOUNTER — Other Ambulatory Visit: Payer: Self-pay

## 2021-12-31 ENCOUNTER — Encounter: Payer: Self-pay | Admitting: Cardiology

## 2021-12-31 VITALS — BP 118/68 | HR 95 | Ht 69.0 in | Wt 183.2 lb

## 2021-12-31 DIAGNOSIS — I10 Essential (primary) hypertension: Secondary | ICD-10-CM

## 2021-12-31 DIAGNOSIS — I6529 Occlusion and stenosis of unspecified carotid artery: Secondary | ICD-10-CM | POA: Diagnosis not present

## 2021-12-31 DIAGNOSIS — Z951 Presence of aortocoronary bypass graft: Secondary | ICD-10-CM | POA: Diagnosis not present

## 2021-12-31 DIAGNOSIS — E1159 Type 2 diabetes mellitus with other circulatory complications: Secondary | ICD-10-CM | POA: Diagnosis not present

## 2021-12-31 DIAGNOSIS — I6523 Occlusion and stenosis of bilateral carotid arteries: Secondary | ICD-10-CM | POA: Diagnosis not present

## 2021-12-31 MED ORDER — BENZONATATE 100 MG PO CAPS: 100.0000 mg | ORAL_CAPSULE | Freq: Every day | ORAL | 2 refills | Status: DC

## 2021-12-31 NOTE — Patient Instructions (Signed)
Medication Instructions:  Your physician has recommended you make the following change in your medication:   START: Tessalon Pearls 100mg  daily  *If you need a refill on your cardiac medications before your next appointment, please call your pharmacy*   Lab Work: None If you have labs (blood work) drawn today and your tests are completely normal, you will receive your results only by: [place] (if you have MyChart) OR A paper copy in the mail If you have any lab test that is abnormal or we need to change your treatment, we will call you to review the results.   Testing/Procedures: None   Follow-Up: At [facility], you and your health needs are our priority.  As part of our continuing mission to provide you with exceptional heart care, we have created designated Provider Care Teams.  These Care Teams include your primary Cardiologist (physician) and Advanced Practice Providers (APPs -  Physician Assistants and Nurse Practitioners) who all work together to provide you with the care you need, when you need it.  We recommend signing up for the patient portal called "MyChart".  Sign up information is provided on this After Visit Summary.  MyChart is used to connect with patients for Virtual Visits (Telemedicine).  Patients are able to view lab/test results, encounter notes, upcoming appointments, etc.  Non-urgent messages can be sent to your provider as well.   To learn more about what you can do with MyChart, go to [email or web address].    Your next appointment:   3 month(s)  The format for your next appointment:   In Person  Provider:   Jenne Campus, MD    Other Instructions None

## 2021-12-31 NOTE — Progress Notes (Signed)
Cardiology Office Note:    Date:  12/31/2021   ID:  Jon Nelson, DOB 1946/09/23, MRN [id number]  PCP:  Rochel Brome, MD  Cardiologist:  Jenne Campus, MD    Referring MD: Rochel Brome, MD   Chief Complaint  Patient presents with   Follow-up   Medication Refill    All cardiac meds    History of Present Illness:    Jon Nelson is a 76 y.o. 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  He presented to [facility] at the end of October with some chest pain rule-year-old with troponin was transferred to [facility], cardiac catheterization has been performed which showed severe triple-vessel disease and eventually coronary artery bypass graft has been performed with LIMA to LAD, SVG to obtuse marginal branch, SVG to PDA.  He had uncomplicated hospital course comes today to my office for follow-up.  Overall doing very well.  Wounds are healed completely he denies having much pain at the chest only when he coughs and he coughs a lot especially at evening time.  He does not smoke never date.  He is taking all his medications on the regular basis however his sister-in-law who is with him tells me that sometimes he skips medications.  Past Medical History:  Diagnosis Date   Carotid artery stenosis    Diabetes (HCC)    GI bleed    HLD (hyperlipidemia)    HTN (hypertension)    Other amnesia    TIA (transient ischemic attack)     Past Surgical History:  Procedure Laterality Date   CORONARY ARTERY BYPASS GRAFT N/A 09/24/2021   Procedure: CORONARY ARTERY BYPASS GRAFTING (CABG) TIMES THREE , ON PUMP, USING LEFT INTERNAL MAMMARY ARTERY AND ENDOSCOPICALLY HARVESTED RIGHT GREATER SAPHENOUS VEIN;  Surgeon: Lajuana Matte, MD;  Location: [place];  Service: Open Heart Surgery;  Laterality: N/A;   ENDOVEIN HARVEST OF GREATER SAPHENOUS VEIN Right 09/24/2021   Procedure:  ENDOVEIN HARVEST OF GREATER SAPHENOUS VEIN;  Surgeon: Lajuana Matte, MD;  Location: [place];  Service: Open Heart Surgery;  Laterality: Right;   HEMORRHOID SURGERY     LEFT HEART CATH AND CORONARY ANGIOGRAPHY N/A 09/18/2021   Procedure: LEFT HEART CATH AND CORONARY ANGIOGRAPHY;  Surgeon: Belva Crome, MD;  Location: [place] [facility];  Service: Cardiovascular;  Laterality: N/A;   LOOP RECORDER INSERTION N/A 02/03/2019   Procedure: LOOP RECORDER INSERTION;  Surgeon: Constance Haw, MD;  Location: [place] [facility];  Service: Cardiovascular;  Laterality: N/A;   TEE WITHOUT CARDIOVERSION N/A 02/03/2019   Procedure: TRANSESOPHAGEAL ECHOCARDIOGRAM (TEE);  Surgeon: Jerline Pain, MD;  Location: [facility];  Service: Cardiovascular;  Laterality: N/A;  loop   TEE WITHOUT CARDIOVERSION N/A 09/24/2021   Procedure: TRANSESOPHAGEAL ECHOCARDIOGRAM (TEE);  Surgeon: Lajuana Matte, MD;  Location: [place];  Service: Open Heart Surgery;  Laterality: N/A;    Current Medications: Current Meds  Medication Sig   acetaminophen (TYLENOL) 500 MG tablet Take 500 mg by mouth every 6 (six) hours as needed.   aspirin EC 81 MG EC tablet Take 1 tablet (81 mg total) by mouth daily. Swallow whole.   clopidogrel (PLAVIX) 75 MG tablet Take 1 tablet (75 mg total) by mouth daily.   metFORMIN (GLUCOPHAGE) 500 MG tablet Take 1 tablet (500 mg total) by mouth every morning.   metoprolol tartrate (LOPRESSOR) 25 MG tablet Take 0.5  tablets (12.5 mg total) by mouth 2 (two) times daily.   rosuvastatin (CRESTOR) 40 MG tablet Take 1 tablet (40 mg total) by mouth daily.     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  Patient has a sister, his niece and her family lives in his home temporarily    Social Determinants of Health   Financial Resource Strain: Not on file  Food Insecurity: Not on file  Transportation Needs: Not on file  Physical Activity: Not on file  Stress: Not on file  Social Connections: Not on file     Family History: The patient's family history includes Stroke in his mother. ROS:   Please see the history of present illness.    All 14 point review of systems negative except as described per history of present illness  EKGs/Labs/Other Studies Reviewed:      Recent Labs: 09/25/2021: Magnesium 2.1 09/27/2021: Hemoglobin 13.1; Platelets 207 09/28/2021: BUN 15; Creatinine, Ser 0.64; Potassium 4.4; Sodium 133 11/27/2021: ALT 17  Recent Lipid Panel    Component Value Date/Time   CHOL 122 11/27/2021 0808   TRIG 129 11/27/2021 0808   HDL 31 (L) 11/27/2021 0808   CHOLHDL 3.9 11/27/2021 0808   CHOLHDL 5.5 01/31/2019 0513   VLDL 27 01/31/2019 0513   LDLCALC 68 11/27/2021 0808    Physical Exam:    VS:  BP 118/68 (BP Location: Left Arm, Patient Position: Sitting)    Pulse 95    Ht 5\' 9"  (1.753 m)    Wt 183 lb 3.2 oz (83.1 kg)    SpO2 91%    BMI 27.05 kg/m     Wt Readings from Last 3 Encounters:  12/31/21 183 lb 3.2 oz (83.1 kg)  11/06/21 174 lb (78.9 kg)  09/28/21 179 lb 14.3 oz (81.6 kg)     GEN:  Well nourished, well developed in no acute distress HEENT: Normal NECK: No JVD; No carotid bruits LYMPHATICS: No lymphadenopathy CARDIAC: RRR, no murmurs, no rubs, no gallops RESPIRATORY:  Clear to auscultation without rales, wheezing or rhonchi  ABDOMEN: Soft, non-tender,  non-distended MUSCULOSKELETAL:  No edema; No deformity  SKIN: Warm and dry LOWER EXTREMITIES: no swelling NEUROLOGIC:  Alert and oriented x 3 PSYCHIATRIC:  Normal affect   ASSESSMENT:    1. S/P CABG x 3   2. Type 2 diabetes mellitus with other circulatory complication, without long-term current use of insulin (HCC)   3. Stenosis of carotid artery, unspecified laterality   4. Bilateral carotid artery stenosis   5. 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  Wound healed completely.  Doing well from that point review I did review all medications one by 1 and  I stressed importance of taking all his medications. Type 2 diabetes his last hemoglobin A1c was 7.2 this is from September 22, 2021 obviously have to have better control of his diabetes he is scheduled to see his primary care physician. Carotic arterial stenosis assessed before surgery not critical no need to intervene we will continue monitoring. Dyslipidemia, he is on high intense statin form of Crestor 40 tolerate this well.  I did review K PN from 11/27/2021 showing LDL of 68 HDL 31 good cholesterol profile we will continue present medications. Cough I do not hear anything in the physical exam.  I will give him some Tessalon Perles to help with the symptoms monitor exactly where he have that we will continue monitoring this and watching it.  I see him back in 3 months   Medication Adjustments/Labs and Tests Ordered: Current medicines are reviewed at length with the patient today.  Concerns regarding medicines are outlined above.  No orders of the defined types were placed in this encounter.  Medication changes: No orders of the defined types were placed in this encounter.   Signed, Park Liter, MD, [facility] 12/31/2021 3:29 PM    [place]

## 2022-01-01 ENCOUNTER — Telehealth: Payer: Self-pay | Admitting: Family Medicine

## 2022-01-01 NOTE — Chronic Care Management (AMB) (Signed)
°  Chronic Care Management   Note  01/01/2022 Name: Jon Nelson MRN: [id number] DOB: [birth year]  Jon Nelson is a 76 y.o. year old male who is a primary care patient of Cox, Kirsten, MD. I reached out to [employer] by phone today in response to a referral sent by [name] PCP, Cox, Kirsten, MD.   [name] was given information about Chronic Care Management services today including:  CCM service includes personalized support from designated clinical staff supervised by his physician, including individualized plan of care and coordination with other care providers 24/7 contact phone numbers for assistance for urgent and routine care needs. Service will only be billed when office clinical staff spend 20 minutes or more in a month to coordinate care. Only one practitioner may furnish and bill the service in a calendar month. The patient may stop CCM services at any time (effective at the end of the month) by phone call to the office staff.   Patient agreed to services and verbal consent obtained.   Follow up plan:   Tatjana Secretary/administrator

## 2022-01-10 ENCOUNTER — Telehealth: Payer: Self-pay

## 2022-01-10 NOTE — Telephone Encounter (Signed)
Patient with loop recorder implanted 02/03/2019 for CVA. No remote monitoring or follow up for device check since 07/18/19. Attempted to contact patient however no voicemail set up on cell. Patient does not live at home number.

## 2022-01-11 ENCOUNTER — Telehealth: Payer: Self-pay

## 2022-01-11 NOTE — Chronic Care Management (AMB) (Signed)
Chronic Care Management Pharmacy Assistant   Name: Jon Nelson  MRN: [id number] DOB: February 03, 1946  Reason for Encounter: Chart Prep for initial visit with CPP    Conditions to be addressed/monitored: CAD, HTN, HLD, and DMII, Cryptogenic Stroke, Bilateral Carotid Artery Stenosis, Acute Coronary Syndrome, TIA, Non ST Elevated Myocardial Infarction.   Recent office visits:  08/28/21 Reinaldo Meeker MD. Seen for DM, HLD and HTN. D/C Donepezil HCI 5 and 10mg . Referral to [facility] Coordination.   08/15/21 Rhae Hammock LPN. Medicare Annual Wellness. No med changes.   Recent consult visits:  12/31/21 (Cardiology) Jenne Campus MD. Seen for Medication Refill. Started on Benzonate 100mg  daily.   11/06/21 ([facility]) Quincy Simmonds PA-C. Seen for Post Op. No med changes.   11/02/21 (Cardiology) Fuller Canada CPP. Seen for HLD. D/C B12, Furosemide 40mg  and Potassium Chloride Er50meq. Stop taking your lisinopril. This should help increase your blood pressure a bit and help with your dry cough if it's coming from a medication. Stop taking meloxicam. It's an NSAID and can increase the risk of heart disease and bleeding.  114/22 (Cardiothoracic Surgery) Jenne Campus MD. Seen for S/P CABGx3. No med changes.   Hospital visits:  Medication Reconciliation was completed by comparing discharge summary, patients EMR and Pharmacy list, and upon discussion with patient.  Admitted to the hospital on 09/17/21 due to Coronary Artery Bypass. Discharge date was 09/28/21. Discharged from [facility]?Medications Started at [facility] Discharge:?? -started Aspirin 81mg , Plavix 75mg , Furosemide 40mg , Metoprolol Tartrate 12.5mg , Potassium Chloride 84meq.  Medications Discontinued at Hospital Discharge: -Stopped Aspirin 325mg , Lisinopril 10mg     -All other medications will remain the same.    Medications: Outpatient Encounter Medications as of 01/11/2022  Medication  Sig   acetaminophen (TYLENOL) 500 MG tablet Take 500 mg by mouth every 6 (six) hours as needed.   aspirin EC 81 MG EC tablet Take 1 tablet (81 mg total) by mouth daily. Swallow whole.   benzonatate (TESSALON PERLES) 100 MG capsule Take 1 capsule (100 mg total) by mouth daily.   clopidogrel (PLAVIX) 75 MG tablet Take 1 tablet (75 mg total) by mouth daily.   metFORMIN (GLUCOPHAGE) 500 MG tablet Take 1 tablet (500 mg total) by mouth every morning.   metoprolol tartrate (LOPRESSOR) 25 MG tablet Take 0.5 tablets (12.5 mg total) by mouth 2 (two) times daily.   rosuvastatin (CRESTOR) 40 MG tablet Take 1 tablet (40 mg total) by mouth daily.   No facility-administered encounter medications on file as of 01/11/2022.    Lab Results  Component Value Date/Time   HGBA1C 7.2 (H) 09/22/2021 04:43 PM   HGBA1C 7.3 (H) 08/28/2021 10:01 AM   MICROALBUR 150 06/28/2020 09:49 AM   MICROALBUR 150 04/01/2020 08:39 PM     BP Readings from Last 3 Encounters:  12/31/21 118/68  11/06/21 112/75  11/02/21 104/62    Patient Questions:   Have you seen any other providers since your last visit with PCP?   Any changes in your medications or health?   Any side effects from any medications?   Do you have an symptoms or problems not managed by your medications?   Any concerns about your health right now?   Has your provider asked that you check blood pressure, blood sugar, or follow special diet at home?   Do you get any type of exercise on a regular basis?   Can you think of a goal you would like to reach for  your health?   Do you have any problems getting your medications?   Is there anything that you would like to discuss during the appointment?    Kort Stettler was reminded to have all medications, supplements and any blood glucose and blood pressure readings available for review with Arizona Constable, Pharm. D, at his telephone visit on 01/16/22 at 8:30am .    Star Rating Drugs:  Medication:  Last  Fill: Day Supply Metformin   09/29/21 90ds Rosuvastatin   09/29/21 90ds    09/27/21 90ds   Care Gaps: Last annual wellness visit? 08/15/21 Colonoscopy: 12/13/13 Last eye exam / retinopathy screening? 05/24/21 Last diabetic foot exam? 08/28/21  Elray Mcgregor, [place] Clinical Pharmacist Assistant  [phone]  I have been unsuccessful at completing this call

## 2022-01-16 ENCOUNTER — Other Ambulatory Visit: Payer: Self-pay

## 2022-01-16 ENCOUNTER — Ambulatory Visit (INDEPENDENT_AMBULATORY_CARE_PROVIDER_SITE_OTHER): Payer: Medicare HMO

## 2022-01-16 DIAGNOSIS — I10 Essential (primary) hypertension: Secondary | ICD-10-CM

## 2022-01-16 DIAGNOSIS — E1159 Type 2 diabetes mellitus with other circulatory complications: Secondary | ICD-10-CM

## 2022-01-16 DIAGNOSIS — E782 Mixed hyperlipidemia: Secondary | ICD-10-CM

## 2022-01-16 NOTE — Progress Notes (Signed)
Chronic Care Management Pharmacy Note  01/16/2022 Name:  Jon Nelson MRN:  [id number] DOB:  02-19-1946  Summary: -Pleasant 76 year old male presents for initial CCM visit. He was out to breakfast when we spoke and didn't have meds in front of him. He recommended I call, [name], his sister in law. She took [place] in for a month after his CABG in 2022 but Donne is back living on his own.  -Scheduled 3-way phone call between [place] for next month  Recommendations/Changes made from today's visit: -Lisinopril Dc'd at hospital but patient needs renal protection -Donepezil Dc'd due to ADR's, recommend alternative  -Patient has no f/u with PCP scheduled, sent msg to Elissa Lovett   Subjective: Jon Nelson is an 76 y.o. year old male who is a primary patient of Cox, Kirsten, MD.  The CCM team was consulted for assistance with disease management and care coordination needs.    Engaged with patient by telephone for initial visit in response to provider referral for pharmacy case management and/or care coordination services.   Consent to Services:  The patient was given the following information about Chronic Care Management services today, agreed to services, and gave verbal consent: 1. CCM service includes personalized support from designated clinical staff supervised by the primary care provider, including individualized plan of care and coordination with other care providers 2. 24/7 contact phone numbers for assistance for urgent and routine care needs. 3. Service will only be billed when office clinical staff spend 20 minutes or more in a month to coordinate care. 4. Only one practitioner may furnish and bill the service in a calendar month. 5.The patient may stop CCM services at any time (effective at the end of the month) by phone call to the office staff. 6. The patient will be responsible for cost sharing (co-pay) of up to 20% of the service fee (after annual deductible is met). Patient agreed to  services and consent obtained.  Patient Care Team: Rochel Brome, MD as PCP - General (Family Medicine) Park Liter, MD as PCP - Cardiology (Cardiology) Stacie Glaze Jacqualyn Posey, MD as Consulting Physician (Optometry) Jolene Schimke, MD as Referring Physician (Dermatology) Lane Hacker, [facility] as Pharmacist (Pharmacist)  Recent office visits:  08/28/21 Reinaldo Meeker MD. Seen for DM, HLD and HTN. D/C Donepezil HCI 5 and 79m. Referral to [facility].    08/15/21 SRhae HammockLPN. Medicare Annual Wellness. No med changes.    Recent consult visits:  12/31/21 (Cardiology) [facility]Jenne CampusMD. Seen for Medication Refill. Started on Benzonate 1042mdaily.    11/06/21 ([facility]) ZiQuincy SimmondsA-C. Seen for Post Op. No med changes.    11/02/21 (Cardiology) SuFuller CanadaPP. Seen for HLD. D/C B12, Furosemide 4067mnd Potassium Chloride Er20m60mStop taking your lisinopril. This should help increase your blood pressure a bit and help with your dry cough if it's coming from a medication. Stop taking meloxicam. It's an NSAID and can increase the risk of heart disease and bleeding.   114/22 (Cardiothoracic Surgery) [facility] Campus Seen for S/P CABGx3. No med changes.    Hospital visits:  Medication Reconciliation was completed by comparing discharge summary, patients EMR and Pharmacy list, and upon discussion with patient.   Admitted to the hospital on 09/17/21 due to Coronary Artery Bypass. Discharge date was 09/28/21. [facility] -started Aspirin 81mg48mavix 75mg,52mosemide 40mg, 70mprolol Tartrate 12.5mg, Po36msium Chloride 20meq.18m  Medications Discontinued at Hospital Discharge: -Stopped Aspirin 356m, Lisinopril 159m    -All other medications will remain the same.    Objective:  Lab Results  Component Value Date   CREATININE 0.64 09/28/2021   BUN 15  09/28/2021   EGFR 90 08/28/2021   GFRNONAA >60 09/28/2021   GFRAA 112 06/28/2020   NA 133 (L) 09/28/2021   K 4.4 09/28/2021   CALCIUM 8.2 (L) 09/28/2021   CO2 25 09/28/2021   GLUCOSE 132 (H) 09/28/2021    Lab Results  Component Value Date/Time   HGBA1C 7.2 (H) 09/22/2021 04:43 PM   HGBA1C 7.3 (H) 08/28/2021 10:01 AM   MICROALBUR 150 06/28/2020 09:49 AM   MICROALBUR 150 04/01/2020 08:39 PM    Last diabetic Eye exam:  Lab Results  Component Value Date/Time   HMDIABEYEEXA No Retinopathy 05/24/2021 12:00 AM    Last diabetic Foot exam: No results found for: HMDIABFOOTEX   Lab Results  Component Value Date   CHOL 122 11/27/2021   HDL 31 (L) 11/27/2021   LDLCALC 68 11/27/2021   TRIG 129 11/27/2021   CHOLHDL 3.9 11/27/2021    Hepatic Function Latest Ref Rng & Units 11/27/2021 08/28/2021 06/28/2020  Total Protein 6.0 - 8.5 g/dL 6.0 6.6 6.6  Albumin 3.7 - 4.7 g/dL 4.0 4.0 4.0  AST 0 - 40 IU/L 18 16 16   ALT 0 - 44 IU/L 17 14 13   Alk Phosphatase 44 - 121 IU/L 133(H) 99 93  Total Bilirubin 0.0 - 1.2 mg/dL 0.3 0.5 0.5  Bilirubin, Direct 0.00 - 0.40 mg/dL <0.10 - -    No results found for: TSH, FREET4  CBC Latest Ref Rng & Units 09/27/2021 09/26/2021 09/25/2021  WBC 4.0 - 10.5 K/uL 15.0(H) 16.0(H) 21.2(H)  Hemoglobin 13.0 - 17.0 g/dL 13.1 12.3(L) 13.8  Hematocrit 39.0 - 52.0 % 37.3(L) 36.3(L) 40.5  Platelets 150 - 400 K/uL 207 200 228    No results found for: VD25OH  Clinical ASCVD: Yes  The ASCVD Risk score (Arnett DK, et al., 2019) failed to calculate for the following reasons:   The patient has a prior MI or stroke diagnosis    [facility] 08/28/2021 08/15/2021 05/18/2020  Decreased Interest 0 0 0  Down, Depressed, Hopeless 0 0 0  PHQ - 2 Score 0 0 0     Other: (CHADS2VASc if Afib, MMRC or CAT for COPD, ACT, DEXA)  Social History   Tobacco Use  Smoking Status Never  Smokeless Tobacco Never   BP Readings from Last 3 Encounters:  12/31/21 118/68   11/06/21 112/75  11/02/21 104/62   Pulse Readings from Last 3 Encounters:  12/31/21 95  11/06/21 100  11/02/21 81   Wt Readings from Last 3 Encounters:  12/31/21 183 lb 3.2 oz (83.1 kg)  11/06/21 174 lb (78.9 kg)  09/28/21 179 lb 14.3 oz (81.6 kg)   BMI Readings from Last 3 Encounters:  12/31/21 27.05 kg/m  11/06/21 25.70 kg/m  09/28/21 26.57 kg/m    Assessment/Interventions: Review of patient past medical history, allergies, medications, health status, including review of consultants reports, laboratory and other test data, was performed as part of comprehensive evaluation and provision of chronic care management services.   SDOH:  (Social Determinants of Health) assessments and interventions performed: Yes SDOH Interventions    Flowsheet Row Most Recent Value  SDOH Interventions   Financial Strain Interventions Intervention Not Indicated  Transportation Interventions Intervention Not Indicated      SDOH Screenings  Alcohol Screen: Not on file  Depression (PHQ2-9): Low Risk    PHQ-2 Score: 0  Financial Resource Strain: Low Risk    Difficulty of Paying Living Expenses: Not hard at all  Food Insecurity: Not on file  Housing: Not on file  Physical Activity: Not on file  Social Connections: Not on file  Stress: Not on file  Tobacco Use: Low Risk    Smoking Tobacco Use: Never   Smokeless Tobacco Use: Never   Passive Exposure: Not on file  Transportation Needs: No Transportation Needs   Lack of Transportation (Medical): No   Lack of Transportation (Non-Medical): No    CCM Care Plan  Allergies  Allergen Reactions   Aricept [Donepezil] Nausea Only    Medications Reviewed Today     Reviewed by Lane Hacker, [facility] (Pharmacist) on 01/16/22 at [place]  Med List Status: <None>   Medication Order Taking? Sig Documenting Provider Last Dose Status Informant  acetaminophen (TYLENOL) 500 MG tablet [id number] No Take 500 mg by mouth every 6 (six) hours as needed.  [provider] Taking Active   aspirin EC 81 MG EC tablet [id number] No Take 1 tablet (81 mg total) by mouth daily. Swallow whole. John Giovanni, PA-C Taking Active   benzonatate (TESSALON PERLES) 100 MG capsule [id number]  Take 1 capsule (100 mg total) by mouth daily. Park Liter, MD  Active   clopidogrel (PLAVIX) 75 MG tablet [id number] No Take 1 tablet (75 mg total) by mouth daily. John Giovanni, PA-C Taking Active   metFORMIN (GLUCOPHAGE) 500 MG tablet [id number] No Take 1 tablet (500 mg total) by mouth every morning. Lillard Anes, MD Taking Active Self           Med Note Broadus John, [facility]   Tue Sep 18, 2021  7:30 PM)    metoprolol tartrate (LOPRESSOR) 25 MG tablet [id number] No Take 0.5 tablets (12.5 mg total) by mouth 2 (two) times daily. John Giovanni, PA-C Taking Active   rosuvastatin (CRESTOR) 40 MG tablet [id number] No Take 1 tablet (40 mg total) by mouth daily. 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   Coronary artery disease 09/24/2021   NSTEMI (non-ST elevated myocardial infarction) ([place]) 09/18/2021   ACS (acute coronary syndrome) ([place]) 09/18/2021   Acute venous embolism and thrombosis of deep veins of upper extremity, unspecified laterality ([place]) 08/28/2021   Diabetic nephropathy associated with type 2 diabetes mellitus ([place]) 03/30/2020   Memory loss 03/30/2020   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 ([place]) 03/30/2020   Bilateral carotid artery stenosis 03/30/2020   History of Holter monitoring 03/30/2020   Hyperlipidemia 02/02/2019   Diabetes ([place])    HTN (hypertension)    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 01/30/2019    Immunization History  Administered Date(s) Administered   Fluad Quad(high Dose 65+)  08/15/2021   Hepatitis B 05/15/1998, 06/12/1998, 10/11/1998   Moderna Sars-Covid-2 Vaccination 01/14/2020, 02/09/2020   Td 12/16/1989    Conditions to be addressed/monitored:  Hypertension, Hyperlipidemia, and Diabetes  Care Plan : [place]  Updates made by Lane Hacker, [place] since 01/16/2022 12:00 AM     Problem: DM, Lipids, Cardio   Priority: High  Onset Date: 01/16/2022     Long-Range Goal: Disease State Managment   Start Date: 01/16/2022  Expected End Date: 01/16/2023  This Visit's Progress: On track  Priority: High  Note:   Current Barriers:  Does not contact provider office for questions/concerns  Pharmacist Clinical Goal(s):  Patient will contact provider office for questions/concerns as evidenced notation of same in electronic health record through collaboration with PharmD and provider.   Interventions: 1:1 collaboration with Cox, Kirsten, MD regarding development and update of comprehensive plan of care as evidenced by provider attestation and co-signature Inter-disciplinary care team collaboration (see longitudinal plan of care) Comprehensive medication review performed; medication list updated in electronic medical record  Hypertension (BP goal <140/90) -Controlled -Current treatment: Metoprolol 71m take 1/2 BID -Medications previously tried: Lisinopril  -Current home readings: N/A -Current dietary habits: "Tries to eat healthy" -Current exercise habits: None -Denies hypotensive/hypertensive symptoms -Educated on BP goals and benefits of medications for prevention of heart attack, stroke and kidney damage; -Counseled to monitor BP at home daily, document, and provide log at future appointments -Recommended to continue current medication  CAD: (LDL goal < 100) CABG: 09/18/21  The ASCVD Risk score (Arnett DK, et al., 2019) failed to calculate for the following reasons:   The patient has a prior MI or stroke diagnosis Lab Results   Component Value Date   CHOL 122 11/27/2021   CHOL 226 (H) 08/28/2021   CHOL 224 (H) 06/28/2020   Lab Results  Component Value Date   HDL 31 (L) 11/27/2021   HDL 28 (L) 08/28/2021   HDL 26 (L) 06/28/2020   Lab Results  Component Value Date   LDLCALC 68 11/27/2021   LDLCALC 150 (H) 08/28/2021   LDLCALC 143 (H) 06/28/2020   Lab Results  Component Value Date   TRIG 129 11/27/2021   TRIG 258 (H) 08/28/2021   TRIG 299 (H) 06/28/2020   Lab Results  Component Value Date   CHOLHDL 3.9 11/27/2021   CHOLHDL 8.1 (H) 08/28/2021   CHOLHDL 8.6 (H) 06/28/2020  No results found for: LDLDIRECT  -Not ideally controlled -Current treatment: Rosuvastatin 468mAppropriate, Effective, Safe, Accessible ASA 8160mD Appropriate, Effective, Safe, Accessible Clopidogrel 71m27mpropriate, Effective, Safe, Accessible -Medications previously tried: N/A  -Current dietary patterns: "Tries to eat healthy" -Current exercise habits: None -Educated on Cholesterol goals;  -Recommended to continue current medication  Diabetes (A1c goal <8%) Lab Results  Component Value Date   HGBA1C 7.2 (H) 09/22/2021   HGBA1C 7.3 (H) 08/28/2021   HGBA1C 6.9 (H) 06/28/2020   Lab Results  Component Value Date   MICROALBUR 150 06/28/2020   LDLCALC 68 11/27/2021   CREATININE 0.64 09/28/2021   Lab Results  Component Value Date   NA 133 (L) 09/28/2021   K 4.4 09/28/2021   CREATININE 0.64 09/28/2021   EGFR 90 08/28/2021   GFRNONAA >60 09/28/2021   GLUCOSE 132 (H) 09/28/2021   Lab Results  Component Value Date   WBC 15.0 (H) 09/27/2021   HGB 13.1 09/27/2021   HCT 37.3 (L) 09/27/2021   MCV 87.6 09/27/2021   PLT 207 09/27/2021  -Controlled -Current medications: Metformin 500mg102m-Medications previously tried: N/A  -Current home glucose readings fasting glucose:  Feb 2023: Patient was out to lunch when I called -Denies hypoglycemic/hyperglycemic symptoms -Current exercise: None -Educated on  Complications of diabetes including kidney damage, retinal damage, and cardiovascular disease; -Counseled to check feet daily and get yearly eye exams Feb 2023: Lisinopril was Dc'd, patient requires  ACE/ARB  Memory Loss (Goal: Slow progression) -Uncontrolled -Current treatment  None -Medications previously tried: Donepezil (N/V)  Feb 2023: Coordinate with PCP to try alternative agent  Medication Adherance -Uncontrolled -Current treatment  Spoke with patient who forgot about appt and was out to breakfast. He told me to call [name] who takes care of his meds. After speaking with her, she told me patient forgets his meds often and they don't think he's taking them.  Plan: Will have 3-way call, talk about [facility]stream together ([name] was already interested). Will try one month of pills in packs, if he does not take Metoprolol BID, will also ask Cardio to change to Succinate to improve compliance so he can take all meds at once   Patient Goals/Self-Care Activities Patient will:  - take medications as prescribed as evidenced by patient report and record review  Follow Up Plan: The patient has been provided with contact information for the care management team and has been advised to call with any health related questions or concerns.   CPP F/U March 2023  [place] Constable, Sherian Rein.D. - [phone]       Medication Assistance: None required.  Patient affirms current coverage meets needs.  Compliance/Adherence/Medication fill history: Star Rating Drugs:  Medication:                Last Fill:         Day Supply Metformin                    09/29/21          90ds Rosuvastatin               09/29/21          90ds                                     09/27/21          90ds     Care Gaps: Last annual wellness visit? 08/15/21 Colonoscopy: 12/13/13 Last eye exam / retinopathy screening? 05/24/21 Last diabetic foot exam? 08/28/21  Patient's preferred pharmacy is:  [place], [place] [address] Phone: [phone] Fax: [place], [place] [address] [place] [address] Phone: [phone] Fax: [phone]  Uses pill box? No -   Pt endorses 50% compliance   Care Plan and Follow Up Patient Decision:  Patient agrees to Care Plan and Follow-up.  Plan: The patient has been provided with contact information for the care management team and has been advised to call with any health related questions or concerns.   CPP F/U March 2023  [place] Constable, Sherian Rein.D. - [phone]

## 2022-01-16 NOTE — Patient Instructions (Signed)
Visit Information   Goals Addressed             This Visit's Progress    Manage My Medicine       Timeframe:  Long-Range Goal Priority:  High Start Date:                             Expected End Date:                       Follow Up Date 01/16/23   - keep a list of all the medicines I take; vitamins and herbals too    Why is this important?   These steps will help you keep on track with your medicines.   Notes:      Track and Manage My Blood Pressure-Hypertension       Timeframe:  Long-Range Goal Priority:  High Start Date:                             Expected End Date:                       Follow Up Date 01/16/23   - choose a place to take my blood pressure (home, clinic or office, retail store)    Why is this important?   You won't feel high blood pressure, but it can still hurt your blood vessels.  High blood pressure can cause heart or kidney problems. It can also cause a stroke.  Making lifestyle changes like losing a little weight or eating less salt will help.  Checking your blood pressure at home and at different times of the day can help to control blood pressure.  If the doctor prescribes medicine remember to take it the way the doctor ordered.  Call the office if you cannot afford the medicine or if there are questions about it.     Notes:        Patient Care Plan: CCM Pharmacy Care Plan     Problem Identified: DM, Lipids, Cardio   Priority: High  Onset Date: 01/16/2022     Long-Range Goal: Disease State Managment   Start Date: 01/16/2022  Expected End Date: 01/16/2023  This Visit's Progress: On track  Priority: High  Note:   Current Barriers:  Does not contact provider office for questions/concerns  Pharmacist Clinical Goal(s):  Patient will contact provider office for questions/concerns as evidenced notation of same in electronic health record through collaboration with PharmD and provider.   Interventions: 1:1 collaboration with Cox,  Kirsten, MD regarding development and update of comprehensive plan of care as evidenced by provider attestation and co-signature Inter-disciplinary care team collaboration (see longitudinal plan of care) Comprehensive medication review performed; medication list updated in electronic medical record  Hypertension (BP goal <140/90) -Controlled -Current treatment: Metoprolol 70m take 1/2 BID -Medications previously tried: Lisinopril  -Current home readings: N/A -Current dietary habits: "Tries to eat healthy" -Current exercise habits: None -Denies hypotensive/hypertensive symptoms -Educated on BP goals and benefits of medications for prevention of heart attack, stroke and kidney damage; -Counseled to monitor BP at home daily, document, and provide log at future appointments -Recommended to continue current medication  CAD: (LDL goal < 100) CABG: 09/18/21  The ASCVD Risk score (Arnett DK, et al., 2019) failed to calculate for the following reasons:   The patient has a prior MI  or stroke diagnosis Lab Results  Component Value Date   CHOL 122 11/27/2021   CHOL 226 (H) 08/28/2021   CHOL 224 (H) 06/28/2020   Lab Results  Component Value Date   HDL 31 (L) 11/27/2021   HDL 28 (L) 08/28/2021   HDL 26 (L) 06/28/2020   Lab Results  Component Value Date   LDLCALC 68 11/27/2021   LDLCALC 150 (H) 08/28/2021   LDLCALC 143 (H) 06/28/2020   Lab Results  Component Value Date   TRIG 129 11/27/2021   TRIG 258 (H) 08/28/2021   TRIG 299 (H) 06/28/2020   Lab Results  Component Value Date   CHOLHDL 3.9 11/27/2021   CHOLHDL 8.1 (H) 08/28/2021   CHOLHDL 8.6 (H) 06/28/2020  No results found for: LDLDIRECT  -Not ideally controlled -Current treatment: Rosuvastatin 70m Appropriate, Effective, Safe, Accessible ASA 873mQD Appropriate, Effective, Safe, Accessible Clopidogrel 7569mppropriate, Effective, Safe, Accessible -Medications previously tried: N/A  -Current dietary patterns: "Tries  to eat healthy" -Current exercise habits: None -Educated on Cholesterol goals;  -Recommended to continue current medication  Diabetes (A1c goal <8%) Lab Results  Component Value Date   HGBA1C 7.2 (H) 09/22/2021   HGBA1C 7.3 (H) 08/28/2021   HGBA1C 6.9 (H) 06/28/2020   Lab Results  Component Value Date   MICROALBUR 150 06/28/2020   LDLCALC 68 11/27/2021   CREATININE 0.64 09/28/2021   Lab Results  Component Value Date   NA 133 (L) 09/28/2021   K 4.4 09/28/2021   CREATININE 0.64 09/28/2021   EGFR 90 08/28/2021   GFRNONAA >60 09/28/2021   GLUCOSE 132 (H) 09/28/2021   Lab Results  Component Value Date   WBC 15.0 (H) 09/27/2021   HGB 13.1 09/27/2021   HCT 37.3 (L) 09/27/2021   MCV 87.6 09/27/2021   PLT 207 09/27/2021  -Controlled -Current medications: Metformin 500m78m -Medications previously tried: N/A  -Current home glucose readings fasting glucose:  Feb 2023: Patient was out to lunch when I called -Denies hypoglycemic/hyperglycemic symptoms -Current exercise: None -Educated on Complications of diabetes including kidney damage, retinal damage, and cardiovascular disease; -Counseled to check feet daily and get yearly eye exams Feb 2023: Lisinopril was Dc'd, patient requires ACE/ARB  Memory Loss (Goal: Slow progression) -Uncontrolled -Current treatment  None -Medications previously tried: Donepezil (N/V)  Feb 2023: Coordinate with PCP to try alternative agent  Medication Adherance -Uncontrolled -Current treatment  Spoke with patient who forgot about appt and was out to breakfast. He told me to call [name] who takes care of his meds. After speaking with her, she told me patient forgets his meds often and they don't think he's taking them.  Plan: Will have 3-way call, talk about Upstream together ([name] was already interested). Will try one month of pills in packs, if he does not take Metoprolol BID, will also ask Cardio to change to Succinate to improve compliance so he  can take all meds at once   Patient Goals/Self-Care Activities Patient will:  - take medications as prescribed as evidenced by patient report and record review  Follow Up Plan: The patient has been provided with contact information for the care management team and has been advised to call with any health related questions or concerns.   CPP F/U March 2023  [place]athAri[place] ConstablearSherian Rein- [phone]      Mr. CrokBrubacher given information about Chronic Care Management services today including:  CCM service includes personalized support from designated clinical staff supervised by his physician, including individualized plan  of care and coordination with other care providers 24/7 contact phone numbers for assistance for urgent and routine care needs. Standard insurance, coinsurance, copays and deductibles apply for chronic care management only during months in which we provide at least 20 minutes of these services. Most insurances cover these services at 100%, however patients may be responsible for any copay, coinsurance and/or deductible if applicable. This service may help you avoid the need for more expensive face-to-face services. Only one practitioner may furnish and bill the service in a calendar month. The patient may stop CCM services at any time (effective at the end of the month) by phone call to the office staff.  Patient agreed to services and verbal consent obtained.   The patient verbalized understanding of instructions, educational materials, and care plan provided today and declined offer to receive copy of patient instructions, educational materials, and care plan.  The pharmacy team will reach out to the patient again over the next 90 days.   Lane Hacker, [place]

## 2022-01-29 DIAGNOSIS — E119 Type 2 diabetes mellitus without complications: Secondary | ICD-10-CM | POA: Diagnosis not present

## 2022-01-29 DIAGNOSIS — R0981 Nasal congestion: Secondary | ICD-10-CM | POA: Diagnosis not present

## 2022-01-29 DIAGNOSIS — Z20828 Contact with and (suspected) exposure to other viral communicable diseases: Secondary | ICD-10-CM | POA: Diagnosis not present

## 2022-01-29 DIAGNOSIS — E782 Mixed hyperlipidemia: Secondary | ICD-10-CM

## 2022-01-29 DIAGNOSIS — J209 Acute bronchitis, unspecified: Secondary | ICD-10-CM | POA: Diagnosis not present

## 2022-01-29 DIAGNOSIS — I10 Essential (primary) hypertension: Secondary | ICD-10-CM

## 2022-01-29 DIAGNOSIS — R051 Acute cough: Secondary | ICD-10-CM | POA: Diagnosis not present

## 2022-02-07 ENCOUNTER — Other Ambulatory Visit: Payer: Self-pay

## 2022-02-07 ENCOUNTER — Telehealth: Payer: Self-pay

## 2022-02-07 ENCOUNTER — Ambulatory Visit: Payer: Medicare HMO

## 2022-02-07 VITALS — Ht 69.0 in | Wt 183.0 lb

## 2022-02-07 NOTE — Telephone Encounter (Signed)
I called patient x3 for appointment of Annual Wellness Visit. Unable to leave a voicemail because it is full.  ?

## 2022-02-07 NOTE — Patient Instructions (Signed)

## 2022-02-07 NOTE — Progress Notes (Deleted)
Subjective:   Jon Nelson is a 76 y.o. male who presents for Medicare Annual/Subsequent preventive examination.  I connected with  Medhansh Brinkmeier on 02/07/22 by an audio only telemedicine application and verified that I am speaking with the correct person using two identifiers.   I discussed the limitations, risks, security and privacy concerns of performing an evaluation and management service by telephone and the availability of in person appointments. I also discussed with the patient that there may be a patient responsible charge related to this service. The patient expressed understanding and verbally consented to this telephonic visit.  Location of Patient: Home Location of Provider: Office  List any persons and their role that are participating in the visit with the patient.    Review of Systems    Refer to PCP.       Objective:    There were no vitals filed for this visit. There is no height or weight on file to calculate BMI.  Advanced Directives 09/19/2021 08/15/2021 05/18/2020 01/30/2019  Does Patient Have a Medical Advance Directive? No No Yes Yes  Type of Advance Directive - Engineer, production  Does patient want to make changes to medical advance directive? - - No - Patient declined No - Patient declined  Copy of [place] in Chart? - - No - copy requested No - copy requested  Would patient like information on creating a medical advance directive? No - Patient declined No - Patient declined - -    Current Medications (verified) Outpatient Encounter Medications as of 02/07/2022  Medication Sig   acetaminophen (TYLENOL) 500 MG tablet Take 500 mg by mouth every 6 (six) hours as needed.   aspirin EC 81 MG EC tablet Take 1 tablet (81 mg total) by mouth daily. Swallow whole.   benzonatate (TESSALON PERLES) 100 MG capsule Take 1 capsule (100 mg total) by mouth daily.   clopidogrel (PLAVIX) 75 MG tablet Take 1 tablet  (75 mg total) by mouth daily.   metFORMIN (GLUCOPHAGE) 500 MG tablet Take 1 tablet (500 mg total) by mouth every morning.   metoprolol tartrate (LOPRESSOR) 25 MG tablet Take 0.5 tablets (12.5 mg total) by mouth 2 (two) times daily.   rosuvastatin (CRESTOR) 40 MG tablet Take 1 tablet (40 mg total) by mouth daily.   No facility-administered encounter medications on file as of 02/07/2022.    Allergies (verified) Aricept [donepezil]   History: Past Medical History:  Diagnosis Date   Carotid artery stenosis    Diabetes (HCC)    GI bleed    HLD (hyperlipidemia)    HTN (hypertension)    Other amnesia    TIA (transient ischemic attack)    Past Surgical History:  Procedure Laterality Date   CORONARY ARTERY BYPASS GRAFT N/A 09/24/2021   Procedure: CORONARY ARTERY BYPASS GRAFTING (CABG) TIMES THREE , ON PUMP, USING LEFT INTERNAL MAMMARY ARTERY AND ENDOSCOPICALLY HARVESTED RIGHT GREATER SAPHENOUS VEIN;  Surgeon: Lajuana Matte, MD;  Location: [place];  Service: Open Heart Surgery;  Laterality: N/A;   ENDOVEIN HARVEST OF GREATER SAPHENOUS VEIN Right 09/24/2021   Procedure: ENDOVEIN HARVEST OF GREATER SAPHENOUS VEIN;  Surgeon: Lajuana Matte, MD;  Location: [place];  Service: Open Heart Surgery;  Laterality: Right;   HEMORRHOID SURGERY     LEFT HEART CATH AND CORONARY ANGIOGRAPHY N/A 09/18/2021   Procedure: LEFT HEART CATH AND CORONARY ANGIOGRAPHY;  Surgeon: Belva Crome, MD;  Location: [facility] INVASIVE CV  LAB;  Service: Cardiovascular;  Laterality: N/A;   LOOP RECORDER INSERTION N/A 02/03/2019   Procedure: LOOP RECORDER INSERTION;  Surgeon: Constance Haw, MD;  Location: [place] CV LAB;  Service: Cardiovascular;  Laterality: N/A;   TEE WITHOUT CARDIOVERSION N/A 02/03/2019   Procedure: TRANSESOPHAGEAL ECHOCARDIOGRAM (TEE);  Surgeon: Jerline Pain, MD;  Location: [facility] ENDOSCOPY;  Service: Cardiovascular;  Laterality: N/A;  loop   TEE WITHOUT CARDIOVERSION N/A 09/24/2021   Procedure:  TRANSESOPHAGEAL ECHOCARDIOGRAM (TEE);  Surgeon: Lajuana Matte, MD;  Location: [place];  Service: Open Heart Surgery;  Laterality: N/A;   Family History  Problem Relation Age of Onset   Stroke Mother    Social History   Socioeconomic History   Marital status: Widowed    Spouse name: Not on file   Number of children: 0   Years of education: Not on file   Highest education level: Not on file  Occupational History   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  Patient has a sister, his niece and her family lives in his home temporarily    Social Determinants of Health   Financial Resource Strain: Low Risk    Difficulty of Paying Living Expenses: Not hard at all  Food Insecurity: Not on file  Transportation Needs: No Transportation Needs   Lack of Transportation (Medical): No   Lack of Transportation (Non-Medical): No  Physical Activity: Not on file  Stress: Not on file  Social Connections: Not on file    Tobacco Counseling Counseling given: Not Answered   Clinical Intake:                 Diabetic?***         Activities of Daily Living In your present state of health, do you have any difficulty performing the following activities: 09/18/2021 08/15/2021  Hearing? Y N  Comment left ear > rt ear. no hearing aid -  Vision? N N  Difficulty concentrating or making decisions? N Y  Walking or climbing stairs? N N  Dressing or bathing? N N  Doing errands, shopping? N N  Preparing Food and eating ? - N  Using the Toilet? - N  In the past six months, have you accidently leaked urine? - N  Do you have problems with loss  of bowel control? - N  Managing your Medications? - N  Managing your Finances? - N  Housekeeping or managing your Housekeeping? - N  Some recent data might be hidden    Patient Care Team: Rochel Brome, MD as PCP - General (Family Medicine) Park Liter, MD as PCP - Cardiology (Cardiology) [place], Jacqualyn Posey, MD as Consulting Physician (Optometry) Jolene Schimke, MD as Referring Physician (Dermatology) Lane Hacker, [facility] as Pharmacist (Pharmacist)  Indicate any recent Medical Services you may have received from other than [facility] providers in the past year (date may be approximate).     Assessment:   This is a routine wellness examination for Abhinav.  Hearing/Vision screen No results found.  Dietary issues and exercise activities discussed:     Goals  Addressed   None   Depression Screen PHQ 2/9 Scores 08/28/2021 08/15/2021 05/18/2020 03/30/2020 03/30/2020 03/18/2019  PHQ - 2 Score 0 0 0 0 0 0    Fall Risk Fall Risk  08/28/2021 08/15/2021 05/18/2020 05/18/2020 03/30/2020  Falls in the past year? 0 1 0 0 0  Number falls in past yr: 0 1 0 0 0  Injury with Fall? 0 0 0 0 -  Risk for fall due to : - History of fall(s) - No Fall Risks -  Follow up - Falls evaluation completed;Education provided;Falls prevention discussed - Falls evaluation completed;Education provided;Falls prevention discussed -    FALL RISK PREVENTION PERTAINING TO THE HOME:  Any stairs in or around the home? {YES/NO:21197} If so, are there any without handrails? {YES/NO:21197} Home free of loose throw rugs in walkways, pet beds, electrical cords, etc? {YES/NO:21197} Adequate lighting in your home to reduce risk of falls? {YES/NO:21197}  ASSISTIVE DEVICES UTILIZED TO PREVENT FALLS:  Life alert? {YES/NO:21197} Use of a cane, walker or w/c? {YES/NO:21197} Grab bars in the bathroom? {YES/NO:21197} Shower chair or bench in shower? {YES/NO:21197} Elevated toilet seat or a handicapped toilet?  {YES/NO:21197}  TIMED UP AND GO:  Was the test performed? {YES/NO:21197}.  Length of time to ambulate 10 feet: *** sec.   {Appearance of Gait:2101803}  Cognitive Function: MMSE - Mini Mental State Exam 08/28/2021 05/18/2020  Orientation to time 5 5  Orientation to Place 5 5  Registration 3 3  Attention/ Calculation 3 2  Recall 2 0  Language- name 2 objects 2 2  Language- repeat 0 1  Language- follow 3 step command 3 3  Language- read & follow direction 1 1  Write a sentence 1 1  Copy design 0 1  Total score 25 24     6CIT Screen 08/15/2021  What Year? 0 points  What month? 0 points  What time? 0 points  Count back from 20 2 points  Months in reverse 4 points  Repeat phrase 4 points  Total Score 10    Immunizations Immunization History  Administered Date(s) Administered   Fluad Quad(high Dose 65+) 08/15/2021   Hepatitis B 05/15/1998, 06/12/1998, 10/11/1998   Moderna Sars-Covid-2 Vaccination 01/14/2020, 02/09/2020   Td 12/16/1989    {TDAP status:2101805}  {Flu Vaccine status:2101806}  {Pneumococcal vaccine status:2101807}  {Covid-19 vaccine status:2101808}  Qualifies for Shingles Vaccine? {YES/NO:21197}  Zostavax completed {YES/NO:21197}  {Shingrix Completed?:2101804}  Screening Tests Health Maintenance  Topic Date Due   Zoster Vaccines- Shingrix (1 of 2) Never done   TETANUS/TDAP  12/17/1999   Pneumonia Vaccine 46+ Years old (1 - PCV) Never done   COVID-19 Vaccine (3 - Moderna risk series) 03/08/2020   URINE MICROALBUMIN  06/28/2021   HEMOGLOBIN A1C  03/23/2022   OPHTHALMOLOGY EXAM  05/24/2022   FOOT EXAM  08/28/2022   INFLUENZA VACCINE  Completed   Hepatitis C Screening  Completed   HPV VACCINES  Aged Out   COLONOSCOPY (Pts 45-64yr Insurance coverage will need to be confirmed)  Discontinued    Health Maintenance  Health Maintenance Due  Topic Date Due   Zoster Vaccines- Shingrix (1 of 2) Never done   TETANUS/TDAP  12/17/1999   Pneumonia  Vaccine 76 Years old (1 - PCV) Never done   COVID-19 Vaccine (3 - Moderna risk series) 03/08/2020   URINE MICROALBUMIN  06/28/2021    {Colorectal cancer screening:2101809}  Lung Cancer Screening: (Low Dose CT Chest recommended if Age 76-80years, 30 pack-year currently smoking OR have  quit w/in 15years.) {DOES NOT does:27190::"does not"} qualify.   Lung Cancer Screening Referral: ***  Additional Screening:  Hepatitis C Screening: {DOES NOT does:27190::"does not"} qualify; Completed ***  Vision Screening: Recommended annual ophthalmology exams for early detection of glaucoma and other disorders of the eye. Is the patient up to date with their annual eye exam?  {YES/NO:21197} Who is the provider or what is the name of the office in which the patient attends annual eye exams? *** If pt is not established with a provider, would they like to be referred to a provider to establish care? {YES/NO:21197}.   Dental Screening: Recommended annual dental exams for proper oral hygiene  Community Resource Referral / Chronic Care Management: CRR required this visit?  {YES/NO:21197}  CCM required this visit?  {YES/NO:21197}     Plan:     I have personally reviewed and noted the following in the patients chart:   Medical and social history Use of alcohol, tobacco or illicit drugs  Current medications and supplements including opioid prescriptions. {Opioid [phone]} Functional ability and status Nutritional status Physical activity Advanced directives List of other physicians Hospitalizations, surgeries, and ER visits in previous 12 months Vitals Screenings to include cognitive, depression, and falls Referrals and appointments  In addition, I have reviewed and discussed with patient certain preventive protocols, quality metrics, and best practice recommendations. A written personalized care plan for preventive services as well as general preventive health recommendations  were provided to patient.     Augustin Coupe, [place]   02/07/2022   Nurse Notes: ***

## 2022-02-21 ENCOUNTER — Telehealth: Payer: Self-pay

## 2022-02-21 NOTE — Progress Notes (Signed)
Pt was reminded of upcoming visit with CPP. ? ?Elray Mcgregor, CMA ?Clinical Pharmacist Assistant  ?[phone]  ?

## 2022-02-25 ENCOUNTER — Telehealth: Payer: Medicare HMO

## 2022-03-05 ENCOUNTER — Ambulatory Visit: Payer: Medicare HMO | Admitting: Family Medicine

## 2022-05-24 ENCOUNTER — Other Ambulatory Visit: Payer: Self-pay | Admitting: Legal Medicine

## 2022-05-24 DIAGNOSIS — E782 Mixed hyperlipidemia: Secondary | ICD-10-CM

## 2022-08-20 ENCOUNTER — Other Ambulatory Visit: Payer: Self-pay | Admitting: Legal Medicine

## 2022-08-20 DIAGNOSIS — E1159 Type 2 diabetes mellitus with other circulatory complications: Secondary | ICD-10-CM

## 2022-08-20 DIAGNOSIS — E782 Mixed hyperlipidemia: Secondary | ICD-10-CM

## 2022-09-18 DIAGNOSIS — L570 Actinic keratosis: Secondary | ICD-10-CM | POA: Diagnosis not present

## 2022-09-18 DIAGNOSIS — L821 Other seborrheic keratosis: Secondary | ICD-10-CM | POA: Diagnosis not present

## 2023-03-11 DIAGNOSIS — Z125 Encounter for screening for malignant neoplasm of prostate: Secondary | ICD-10-CM | POA: Diagnosis not present

## 2023-03-11 DIAGNOSIS — R413 Other amnesia: Secondary | ICD-10-CM | POA: Diagnosis not present

## 2023-03-11 DIAGNOSIS — I6990 Unspecified sequelae of unspecified cerebrovascular disease: Secondary | ICD-10-CM | POA: Diagnosis not present

## 2023-03-11 DIAGNOSIS — E1159 Type 2 diabetes mellitus with other circulatory complications: Secondary | ICD-10-CM | POA: Diagnosis not present

## 2023-03-11 DIAGNOSIS — I252 Old myocardial infarction: Secondary | ICD-10-CM | POA: Diagnosis not present

## 2023-03-11 DIAGNOSIS — E782 Mixed hyperlipidemia: Secondary | ICD-10-CM | POA: Diagnosis not present

## 2023-03-11 DIAGNOSIS — Z6827 Body mass index (BMI) 27.0-27.9, adult: Secondary | ICD-10-CM | POA: Diagnosis not present

## 2023-03-11 DIAGNOSIS — I2510 Atherosclerotic heart disease of native coronary artery without angina pectoris: Secondary | ICD-10-CM | POA: Diagnosis not present

## 2023-03-11 DIAGNOSIS — Z9181 History of falling: Secondary | ICD-10-CM | POA: Diagnosis not present

## 2023-03-11 DIAGNOSIS — Z139 Encounter for screening, unspecified: Secondary | ICD-10-CM | POA: Diagnosis not present

## 2023-03-11 DIAGNOSIS — I6523 Occlusion and stenosis of bilateral carotid arteries: Secondary | ICD-10-CM | POA: Diagnosis not present

## 2023-03-14 DIAGNOSIS — E538 Deficiency of other specified B group vitamins: Secondary | ICD-10-CM | POA: Diagnosis not present

## 2023-03-17 ENCOUNTER — Encounter: Payer: Self-pay | Admitting: Cardiology

## 2023-03-17 ENCOUNTER — Encounter: Payer: Self-pay | Admitting: *Deleted

## 2023-03-19 DIAGNOSIS — L570 Actinic keratosis: Secondary | ICD-10-CM | POA: Diagnosis not present

## 2023-03-19 DIAGNOSIS — C4372 Malignant melanoma of left lower limb, including hip: Secondary | ICD-10-CM | POA: Diagnosis not present

## 2023-04-01 ENCOUNTER — Inpatient Hospital Stay: Payer: Medicare HMO

## 2023-04-01 ENCOUNTER — Inpatient Hospital Stay: Payer: Medicare HMO | Admitting: Oncology

## 2023-04-01 DIAGNOSIS — C4372 Malignant melanoma of left lower limb, including hip: Secondary | ICD-10-CM | POA: Diagnosis not present

## 2023-04-01 NOTE — Progress Notes (Deleted)
[facility] Cancer Initial Visit:  Patient Care Team: [name], MD as PCP - Cardiology (Cardiology) Jenene Slicker Deberah Castle, MD as Consulting Physician (Optometry) Olegario Shearer, MD as Referring Physician (Dermatology)  CHIEF COMPLAINTS/PURPOSE OF CONSULTATION:  Oncology History   No history exists.    HISTORY OF PRESENTING ILLNESS: Jon Nelson 77 y.o. male is here because of  malignant melanoma Medical history notable for diabetes mellitus, coronary artery disease, cerebrovascular disease, MI  March 19, 2023: Shave biopsy left lateral leg demonstrated malignant melanoma, nodular type.  Breslow depth 5.1 mm.  Clark's level 4.  Ulceration present.  Mitotic index 16/mm.  Pathologic stage T4b.  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)    Old myocardial infarct    Other amnesia    TIA (transient ischemic attack)     SURGICAL HISTORY: Past Surgical History:  Procedure Laterality Date   CATARACT EXTRACTION Left 08/15/2020   CORONARY ARTERY BYPASS GRAFT N/A 09/24/2021   Procedure: CORONARY ARTERY BYPASS GRAFTING (CABG) TIMES THREE , ON PUMP, USING LEFT INTERNAL MAMMARY ARTERY AND ENDOSCOPICALLY HARVESTED RIGHT GREATER SAPHENOUS VEIN;  Surgeon: Corliss Skains, MD;  Location: [facility] OR;  Service: Open Heart Surgery;  Laterality: N/A;   ENDOVEIN HARVEST OF GREATER SAPHENOUS VEIN Right 09/24/2021   Procedure: ENDOVEIN HARVEST OF GREATER SAPHENOUS VEIN;  Surgeon: Corliss Skains, MD;  Location: [facility] OR;  Service: Open Heart Surgery;  Laterality: Right;   HEMORRHOID SURGERY     HERNIA REPAIR   2020-04-18   LEFT HEART CATH AND CORONARY ANGIOGRAPHY N/A 09/18/2021   Procedure: LEFT HEART CATH AND CORONARY ANGIOGRAPHY;  Surgeon: Lyn Records, MD;  Location: [facility] INVASIVE CV LAB;  Service: Cardiovascular;  Laterality: N/A;   LOOP RECORDER INSERTION N/A 02/03/2019   Procedure: LOOP RECORDER INSERTION;  Surgeon: Regan Lemming, MD;  Location: [facility] INVASIVE CV LAB;  Service: Cardiovascular;  Laterality: N/A;   TEE WITHOUT CARDIOVERSION N/A 02/03/2019   Procedure: TRANSESOPHAGEAL ECHOCARDIOGRAM (TEE);  Surgeon: Jake Bathe, MD;  Location: [facility] ENDOSCOPY;  Service: Cardiovascular;  Laterality: N/A;  loop   TEE WITHOUT CARDIOVERSION N/A 09/24/2021   Procedure: TRANSESOPHAGEAL ECHOCARDIOGRAM (TEE);  Surgeon: Corliss Skains, MD;  Location[facility] OR;  Service: Open Heart Surgery;  Laterality: N/A;    SOCIAL HISTORY: Social History   Socioeconomic History   Marital status: Widowed    Spouse name: Not on file   Number of children: 0   Years of education: Not on file   Highest education level: Not on file  Occupational History   Occupation: Retired Brewing technologist)  Tobacco Use   Smoking status: Never   Smokeless tobacco: Never  Vaping Use   Vaping Use: Never used  Substance and Sexual Activity   Alcohol use: Never   Drug use: Never   Sexual activity: Not Currently  Other Topics Concern   Not on file  Social History Narrative   Patient's wife passed away in 19-Apr-2015 due to cancer - she was unable to have children.  Patient has a sister, his niece and her family lives  in his home temporarily    Social Determinants of Health   Financial Resource Strain: Low Risk  (01/16/2022)   Overall Financial Resource Strain (CARDIA)    Difficulty of Paying Living Expenses: Not hard at all  Food Insecurity: Not on file  Transportation Needs: No Transportation Needs (01/16/2022)   PRAPARE - Administrator, Civil Service (Medical): No    Lack of Transportation (Non-Medical): No  Physical  Activity: Not on file  Stress: Not on file  Social Connections: Not on file  Intimate Partner Violence: Not on file    FAMILY HISTORY Family History  Problem Relation Age of Onset   Stroke Mother    Alzheimer's disease Mother     ALLERGIES:  is allergic to aricept [donepezil].  MEDICATIONS:  Current Outpatient Medications  Medication Sig Dispense Refill   acetaminophen (TYLENOL) 500 MG tablet Take 500 mg by mouth every 6 (six) hours as needed.     aspirin EC 81 MG EC tablet Take 1 tablet (81 mg total) by mouth daily. Swallow whole.     clopidogrel (PLAVIX) 75 MG tablet Take 1 tablet (75 mg total) by mouth daily. 30 tablet 1   meloxicam (MOBIC) 15 MG tablet Take 15 mg by mouth daily.     metFORMIN (GLUCOPHAGE) 500 MG tablet Take 1 tablet (500 mg total) by mouth every morning. 90 tablet 2   metoprolol tartrate (LOPRESSOR) 25 MG tablet Take 0.5 tablets (12.5 mg total) by mouth 2 (two) times daily. 30 tablet 1   rosuvastatin (CRESTOR) 40 MG tablet Take 1 tablet (40 mg total) by mouth daily. 90 tablet 1   No current facility-administered medications for this visit.    PHYSICAL EXAMINATION:  ECOG PERFORMANCE STATUS: {CHL ONC ECOG PS:[phone]}   There were no vitals filed for this visit.  There were no vitals filed for this visit.   Physical Exam   LABORATORY DATA: I have personally reviewed the data as listed:  No visits with results within 1 Month(s) from this visit.  Latest known visit with results is:  Telephone on 11/12/2021  Component Date Value Ref Range Status   Total Protein 11/27/2021 6.0  6.0 - 8.5 g/dL Final   Albumin 54/08/8118 4.0  3.7 - 4.7 g/dL Final   Bilirubin Total 11/27/2021 0.3  0.0 - 1.2 mg/dL Final   Bilirubin, Direct 11/27/2021 <0.10  0.00 - 0.40 mg/dL Final   Alkaline Phosphatase 11/27/2021 133 (H)  44 - 121 IU/L Final   AST 11/27/2021 18  0 - 40 IU/L Final   ALT 11/27/2021 17  0 - 44 IU/L Final   Cholesterol, Total 11/27/2021 122  100 - 199  mg/dL Final   Triglycerides 14/78/2956 129  0 - 149 mg/dL Final   HDL 21/30/8657 31 (L)  >39 mg/dL Final   VLDL Cholesterol Cal 11/27/2021 23  5 - 40 mg/dL Final   LDL Chol Calc (NIH) 11/27/2021 68  0 - 99 mg/dL Final   Chol/HDL Ratio 11/27/2021 3.9  0.0 - 5.0 ratio Final   Comment:                                   T. Chol/HDL Ratio  Men  Women                               1/2 Avg.Risk  3.4    3.3                                   Avg.Risk  5.0    4.4                                2X Avg.Risk  9.6    7.1                                3X Avg.Risk 23.4   11.0     RADIOGRAPHIC STUDIES: I have personally reviewed the radiological images as listed and agree with the findings in the report  No results found.  ASSESSMENT/PLAN Cancer Staging  No matching staging information was found for the patient.   No problem-specific Assessment & Plan notes found for this encounter.    No orders of the defined types were placed in this encounter.     minutes was spent in patient care.  This included time spent preparing to see the patient (e.g., review of tests), obtaining and/or reviewing separately obtained history, counseling and educating the patient/family/caregiver, ordering medications, tests, or procedures; documenting clinical information in the electronic or other health record, independently interpreting results and communicating results to the patient/family/caregiver as well as coordination of care.       All questions were answered. The patient knows to call the clinic with any problems, questions or concerns.  This note was electronically signed.    Loni Muse, MD  04/01/2023 8:58 AM

## 2023-04-03 ENCOUNTER — Other Ambulatory Visit: Payer: Self-pay | Admitting: Pharmacist

## 2023-04-03 ENCOUNTER — Encounter: Payer: Self-pay | Admitting: Oncology

## 2023-04-03 ENCOUNTER — Inpatient Hospital Stay: Payer: Medicare HMO | Attending: Oncology

## 2023-04-03 ENCOUNTER — Inpatient Hospital Stay: Payer: Medicare HMO | Admitting: Oncology

## 2023-04-03 VITALS — BP 123/76 | HR 86 | Temp 97.5°F | Resp 14 | Ht 68.0 in | Wt 185.6 lb

## 2023-04-03 DIAGNOSIS — C4372 Malignant melanoma of left lower limb, including hip: Secondary | ICD-10-CM | POA: Diagnosis not present

## 2023-04-03 DIAGNOSIS — I10 Essential (primary) hypertension: Secondary | ICD-10-CM | POA: Insufficient documentation

## 2023-04-03 DIAGNOSIS — I252 Old myocardial infarction: Secondary | ICD-10-CM | POA: Insufficient documentation

## 2023-04-03 DIAGNOSIS — E1159 Type 2 diabetes mellitus with other circulatory complications: Secondary | ICD-10-CM | POA: Diagnosis not present

## 2023-04-03 DIAGNOSIS — Z79899 Other long term (current) drug therapy: Secondary | ICD-10-CM | POA: Diagnosis not present

## 2023-04-03 DIAGNOSIS — I2510 Atherosclerotic heart disease of native coronary artery without angina pectoris: Secondary | ICD-10-CM | POA: Insufficient documentation

## 2023-04-03 DIAGNOSIS — C439 Malignant melanoma of skin, unspecified: Secondary | ICD-10-CM | POA: Insufficient documentation

## 2023-04-03 DIAGNOSIS — R54 Age-related physical debility: Secondary | ICD-10-CM | POA: Insufficient documentation

## 2023-04-03 DIAGNOSIS — L578 Other skin changes due to chronic exposure to nonionizing radiation: Secondary | ICD-10-CM

## 2023-04-03 LAB — CBC WITH DIFFERENTIAL/PLATELET
Abs Immature Granulocytes: 0.18 10*3/uL — ABNORMAL HIGH (ref 0.00–0.07)
Basophils Absolute: 0.1 10*3/uL (ref 0.0–0.1)
Basophils Relative: 1 %
Eosinophils Absolute: 0.4 10*3/uL (ref 0.0–0.5)
Eosinophils Relative: 4 %
HCT: 44.6 % (ref 39.0–52.0)
Hemoglobin: 14.8 g/dL (ref 13.0–17.0)
Immature Granulocytes: 2 %
Lymphocytes Relative: 29 %
Lymphs Abs: 3.1 10*3/uL (ref 0.7–4.0)
MCH: 29.5 pg (ref 26.0–34.0)
MCHC: 33.2 g/dL (ref 30.0–36.0)
MCV: 88.8 fL (ref 80.0–100.0)
Monocytes Absolute: 1 10*3/uL (ref 0.1–1.0)
Monocytes Relative: 10 %
Neutro Abs: 5.9 10*3/uL (ref 1.7–7.7)
Neutrophils Relative %: 54 %
Platelets: 301 10*3/uL (ref 150–400)
RBC: 5.02 MIL/uL (ref 4.22–5.81)
RDW: 13.3 % (ref 11.5–15.5)
WBC: 10.7 10*3/uL — ABNORMAL HIGH (ref 4.0–10.5)
nRBC: 0 % (ref 0.0–0.2)

## 2023-04-03 LAB — COMPREHENSIVE METABOLIC PANEL
ALT: 23 U/L (ref 0–44)
AST: 20 U/L (ref 15–41)
Albumin: 3.5 g/dL (ref 3.5–5.0)
Alkaline Phosphatase: 106 U/L (ref 38–126)
Anion gap: 8 (ref 5–15)
BUN: 14 mg/dL (ref 8–23)
CO2: 25 mmol/L (ref 22–32)
Calcium: 8.4 mg/dL — ABNORMAL LOW (ref 8.9–10.3)
Chloride: 100 mmol/L (ref 98–111)
Creatinine, Ser: 0.74 mg/dL (ref 0.61–1.24)
GFR, Estimated: >60 mL/min (ref >60)
Glucose, Bld: 203 mg/dL — ABNORMAL HIGH (ref 70–99)
Potassium: 4.6 mmol/L (ref 3.5–5.1)
Sodium: 133 mmol/L — ABNORMAL LOW (ref 135–145)
Total Bilirubin: 0.7 mg/dL (ref 0.3–1.2)
Total Protein: 6.8 g/dL (ref 6.5–8.1)

## 2023-04-03 LAB — LACTATE DEHYDROGENASE: LDH: 129 U/L (ref 98–192)

## 2023-04-03 NOTE — Progress Notes (Signed)
Jon Nelson:  Patient Care Team: Patient, No Pcp Per as PCP - General (General Practice) [name], MD as PCP - Cardiology (Cardiology) Jenene Slicker, Deberah Castle, MD as Consulting Physician (Optometry) Olegario Shearer, MD as Referring Physician (Dermatology)  CHIEF COMPLAINTS/PURPOSE OF CONSULTATION:  Oncology History  Malignant melanoma (HCC)  04/03/2023 Initial Diagnosis   Malignant melanoma (HCC)   04/03/2023 Cancer Staging   Staging form: Melanoma of the Skin, AJCC 8th Edition - Clinical stage from 04/03/2023: Stage IIC (cT4b, cN0, cM0) - Signed by Loni Muse, MD on 04/03/2023 Histopathologic type: Nodular melanoma Stage prefix: Initial diagnosis   04/17/2023 -  Chemotherapy   Patient is on Treatment Plan : MELANOMA Pembrolizumab (200) q21d       HISTORY OF PRESENTING ILLNESS: Jon Nelson 77 y.o. 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  Breslow depth 5.1 mm.  Clark's level 4.  Ulceration present.  Mitotic index 16/mm.  Pathologic stage T4b.  Deep margin involved  Apr 03 2023:  [facility] Medical Oncology Consult Patient here with sister in law Ms Jon Nelson who is also his power of attorney.  He has had a mole on the left lateral leg for years but in the last year it degenerated into a sore which didn't heal which prompted him to see a dermatologist.  He has been followed by dermatology for removal of non-melanomatous skin cancers on his face and arms over the years.  He underwent a shave biopsy of the left leg lesion on March 19 2023.  He has not been noted to have in transit lesions.  April 01 2023 he underwent a wide local excision of the region but did not undergo inguinal lymphadenectomy.     Social:  Widowed.  Lives with his dog.   Worked for [employer] where he worked outside.  No smoking.  EtOH none  [facility] Mother died 55 dementia Father estranged No siblings.    Review of Systems  Constitutional:  Negative for appetite change, chills, fatigue, fever and unexpected weight change.  HENT:   Positive for hearing loss. Negative for lump/mass, mouth sores, nosebleeds, sore throat and trouble swallowing.        Deaf left ear  Eyes:  Positive for eye problems. Negative for icterus.       Vision changes:  None  Respiratory:  Negative for chest tightness, cough, hemoptysis and wheezing.        DOE on walking up hill  Cardiovascular:  Positive for leg swelling. Negative for chest pain and palpitations.       PND:  none Orthopnea:  none  Gastrointestinal:  Negative for abdominal distention, abdominal pain, blood in stool, constipation, diarrhea, nausea and vomiting.  Endocrine: Negative for hot flashes.       Cold intolerance:  none Heat intolerance:  none  Genitourinary:  Positive for frequency. Negative for bladder incontinence, difficulty urinating, dysuria and hematuria.        Nocturia x 3 to 4  Musculoskeletal:  Negative for arthralgias, back pain, gait problem, myalgias, neck pain and neck stiffness.  Skin:  Negative for rash.       Chronic pruritus of arms.  Healing from surgery to left leg  Neurological:  Negative for extremity weakness, gait problem, headaches, numbness and speech difficulty.  Occasional dizziness particularly when bending over.  No falls   Hematological:  Negative for adenopathy. Does not bruise/bleed easily.  Psychiatric/Behavioral:  Negative for sleep disturbance and suicidal ideas. The patient is not nervous/anxious.     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)    Old myocardial infarct    Other amnesia    TIA (transient ischemic attack)     SURGICAL HISTORY: Past Surgical History:  Procedure Laterality Date   CATARACT EXTRACTION Left 08/15/2020   CORONARY ARTERY BYPASS GRAFT N/A 09/24/2021   Procedure: CORONARY ARTERY BYPASS GRAFTING (CABG) TIMES THREE , ON PUMP, USING LEFT INTERNAL MAMMARY ARTERY AND ENDOSCOPICALLY HARVESTED RIGHT GREATER SAPHENOUS VEIN;  Surgeon: Corliss Skains, MD;  Location: [facility] OR;  Service: Open Heart Surgery;  Laterality: N/A;   ENDOVEIN HARVEST OF GREATER SAPHENOUS VEIN Right 09/24/2021   Procedure: ENDOVEIN HARVEST OF GREATER SAPHENOUS VEIN;  Surgeon: Corliss Skains, MD;  Location: [facility] OR;  Service: Open Heart Surgery;  Laterality: Right;   HEMORRHOID SURGERY     HERNIA REPAIR  2020-04-22   LEFT HEART CATH AND CORONARY ANGIOGRAPHY N/A 09/18/2021   Procedure: LEFT HEART CATH AND CORONARY ANGIOGRAPHY;  Surgeon: Lyn Records, MD;  Location: [facility] INVASIVE CV LAB;  Service: Cardiovascular;  Laterality: N/A;   LOOP RECORDER INSERTION N/A 02/03/2019   Procedure: LOOP RECORDER INSERTION;  Surgeon: Regan Lemming, MD;  Location: [facility] INVASIVE CV LAB;  Service: Cardiovascular;  Laterality: N/A;   MOLE REMOVAL     SKIN CANCER EXCISION     TEE WITHOUT CARDIOVERSION N/A 02/03/2019   Procedure: TRANSESOPHAGEAL ECHOCARDIOGRAM (TEE);  Surgeon: Jake Bathe, MD;  Location: [facility] ENDOSCOPY;  Service: Cardiovascular;  Laterality: N/A;  loop   TEE WITHOUT CARDIOVERSION N/A 09/24/2021   Procedure: TRANSESOPHAGEAL ECHOCARDIOGRAM (TEE);  Surgeon: Corliss Skains, MD;  Location: [facility] OR;  Service: Open Heart Surgery;  Laterality: N/A;    SOCIAL HISTORY: Social History   Socioeconomic History   Marital status: Widowed    Spouse name: Not on file   Number of children: 0   Years of education: Not on file   Highest  education level: Not on file  Occupational History   Occupation: Retired Brewing technologist)  Tobacco Use   Smoking status: Never   Smokeless tobacco: Never  Vaping Use   Vaping Use: Never used  Substance and Sexual Activity   Alcohol use: Never   Drug use: Never   Sexual activity: Not Currently  Other Topics Concern   Not on file  Social History Narrative   Patient's wife passed away in 23-Apr-2015 due to cancer - she was unable to have children.  Patient has a sister, his niece and her family lives in his home temporarily    Social Determinants of Health   Financial Resource Strain: Low Risk  (01/16/2022)   Overall Financial Resource Strain (CARDIA)    Difficulty of Paying Living Expenses: Not hard at all  Food Insecurity: No Food Insecurity (04/03/2023)   Hunger Vital Sign    Worried About Running Out of Food in the Last Year: Never true    Ran Out of  Food in the Last Year: Never true  Transportation Needs: No Transportation Needs (01/16/2022)   PRAPARE - Administrator, Civil Service (Medical): No    Lack of Transportation (Non-Medical): No  Physical Activity: Not on file  Stress: Not on file  Social Connections: Not on file  Intimate Partner Violence: Not At Risk (04/03/2023)   Humiliation, Afraid, Rape, and Kick questionnaire    Fear of Current or Ex-Partner: No    Emotionally Abused: No    Physically Abused: No    Sexually Abused: No    FAMILY HISTORY Family History  Problem Relation Age of Onset   Stroke Mother    Alzheimer's disease Mother     ALLERGIES:  is allergic to aricept [donepezil].  MEDICATIONS:  Current Outpatient Medications  Medication Sig Dispense Refill   acetaminophen (TYLENOL) 500 MG tablet Take 500 mg by mouth every 6 (six) hours as needed.     metFORMIN (GLUCOPHAGE) 500 MG tablet Take 1 tablet (500 mg total) by mouth every morning. 90 tablet 2   traMADol (ULTRAM) 50 MG tablet Take 50 mg by mouth every 6 (six) hours as needed.     aspirin EC 81  MG EC tablet Take 1 tablet (81 mg total) by mouth daily. Swallow whole. (Patient not taking: Reported on 04/03/2023)     clopidogrel (PLAVIX) 75 MG tablet Take 1 tablet (75 mg total) by mouth daily. (Patient not taking: Reported on 04/03/2023) 30 tablet 1   meloxicam (MOBIC) 15 MG tablet Take 15 mg by mouth daily. (Patient not taking: Reported on 04/03/2023)     metoprolol tartrate (LOPRESSOR) 25 MG tablet Take 0.5 tablets (12.5 mg total) by mouth 2 (two) times daily. (Patient not taking: Reported on 04/03/2023) 30 tablet 1   rosuvastatin (CRESTOR) 40 MG tablet Take 1 tablet (40 mg total) by mouth daily. (Patient not taking: Reported on 04/03/2023) 90 tablet 1   No current facility-administered medications for this Nelson.    PHYSICAL EXAMINATION:  ECOG PERFORMANCE STATUS: 1 - Symptomatic but completely ambulatory   Vitals:   04/03/23 0846  BP: 123/76  Pulse: 86  Resp: 14  Temp: (!) 97.5 F (36.4 C)  SpO2: 96%    Filed Weights   04/03/23 0846  Weight: 185 lb 9.6 oz (84.2 kg)     Physical Exam Vitals and nursing note reviewed.  Constitutional:      Appearance: Normal appearance. He is not diaphoretic.     Comments: Here with sister in law  HENT:     Head: Normocephalic and atraumatic.     Right Ear: External ear normal.     Left Ear: External ear normal.     Nose: Nose normal.  Eyes:     Conjunctiva/sclera: Conjunctivae normal.     Pupils: Pupils are equal, round, and reactive to light.  Cardiovascular:     Rate and Rhythm: Normal rate and regular rhythm.     Heart sounds:     No friction rub. No gallop.  Pulmonary:     Effort: Pulmonary effort is normal. No respiratory distress.     Breath sounds: Normal breath sounds. No stridor.  Abdominal:     General: Abdomen is flat. There is no distension.     Palpations: Abdomen is soft.     Tenderness: There is no abdominal tenderness. There is no guarding.  Musculoskeletal:        General: No swelling. Normal range of motion.      Cervical back:  Normal range of motion and neck supple. No rigidity or tenderness.     Right lower leg: Edema present.     Left lower leg: Edema present.  Lymphadenopathy:     Head:     Right side of head: No submental, submandibular, tonsillar, preauricular, posterior auricular or occipital adenopathy.     Left side of head: No submental, submandibular, tonsillar, preauricular, posterior auricular or occipital adenopathy.     Cervical: No cervical adenopathy.     Right cervical: No superficial, deep or posterior cervical adenopathy.    Left cervical: No superficial, deep or posterior cervical adenopathy.     Upper Body:     Right upper body: No supraclavicular or axillary adenopathy.     Left upper body: No supraclavicular or axillary adenopathy.     Lower Body: No right inguinal adenopathy. No left inguinal adenopathy.  Skin:    Coloration: Skin is not jaundiced.     Findings: No bruising or erythema.     Comments: Fitzpatrick 2 skin tone.  Significant solar damage to face and arms.   Left leg bandaged.    Neurological:     General: No focal deficit present.     Mental Status: He is alert and oriented to person, place, and time.     Comments: Hard of hearing  Psychiatric:        Mood and Affect: Mood normal.        Behavior: Behavior normal.        Thought Content: Thought content normal.        Judgment: Judgment normal.     LABORATORY DATA: I have personally reviewed the data as listed:  No visits with results within 1 Month(s) from this Nelson.  Latest known Nelson with results is:  Telephone on 11/12/2021  Component Date Value Ref Range Status   Total Protein 11/27/2021 6.0  6.0 - 8.5 g/dL Final   Albumin 69/62/9528 4.0  3.7 - 4.7 g/dL Final   Bilirubin Total 11/27/2021 0.3  0.0 - 1.2 mg/dL Final   Bilirubin, Direct 11/27/2021 <0.10  0.00 - 0.40 mg/dL Final   Alkaline Phosphatase 11/27/2021 133 (H)  44 - 121 IU/L Final   AST 11/27/2021 18  0 - 40 IU/L Final   ALT  11/27/2021 17  0 - 44 IU/L Final   Cholesterol, Total 11/27/2021 122  100 - 199 mg/dL Final   Triglycerides 41/32/4401 129  0 - 149 mg/dL Final   HDL 02/72/5366 31 (L)  >39 mg/dL Final   VLDL Cholesterol Cal 11/27/2021 23  5 - 40 mg/dL Final   LDL Chol Calc (NIH) 11/27/2021 68  0 - 99 mg/dL Final   Chol/HDL Ratio 11/27/2021 3.9  0.0 - 5.0 ratio Final   Comment:                                   T. Chol/HDL Ratio                                             Men  Women                               1/2 Avg.Risk  3.4    3.3  Avg.Risk  5.0    4.4                                2X Avg.Risk  9.6    7.1                                3X Avg.Risk 23.4   11.0     RADIOGRAPHIC STUDIES: I have personally reviewed the radiological images as listed and agree with the findings in the report  No results found.  ASSESSMENT/PLAN 77 y.o. male is here because of  malignant melanoma.  Medical history notable for diabetes mellitus, coronary artery disease, cerebrovascular disease, MI, cataracts  Malignant melanoma, distal left leg, Stage IIC (T4b N0 M0)- Risk factors for this patient are 1) chronic exposure to UV 2) Sunburns 3) Fitzpatrick Grade 2 skin tone March 19, 2023: Shave biopsy left lateral leg demonstrated malignant melanoma, nodular type. Breslow depth 5.1 mm. Clark's level 4. Ulceration present. Mitotic index 16/mm. Pathologic stage T4b. Deep margin involved  April 01 2023- Wide local excision.  Sentinel LN bx not performed due to patient frailty  Therapeutics   Apr 03 2023- Will obtain CBC with diff, CMP, LDH, MRI brain and whole body PET/CT for staging  Patient meets criteria per NCCN Guidelines for adjuvant immunotherapy and will plan on adjuvant Pembrolizumab  Will need chemotherapy teaching  Poor IV access  Apr 03 2023- Referring for portacath placement     Cancer Staging  Malignant melanoma [facility]) Staging form: Melanoma of the Skin, AJCC 8th  Edition - Clinical stage from 04/03/2023: Stage IIC (cT4b, cN0, cM0) - Signed by Loni Muse, MD on 04/03/2023 Histopathologic type: Nodular melanoma Stage prefix: Initial diagnosis    No problem-specific Assessment & Plan notes found for this encounter.    Orders Placed This Encounter  Procedures   MR Brain W Wo Contrast    Standing Status:   Future    Standing Expiration Date:   04/02/2024    Order Specific Question:   If indicated for the ordered procedure, I authorize the administration of contrast media per Radiology protocol    Answer:   Yes    Order Specific Question:   What is the patient's sedation requirement?    Answer:   No Sedation    Order Specific Question:   Does the patient have a pacemaker or implanted devices?    Answer:   No    Order Specific Question:   Use SRS Protocol?    Answer:   No    Order Specific Question:   Preferred imaging location?    Answer:   External   NM PET Image Initial (PI) Whole Body    Order Specific Question:   If indicated for the ordered procedure, I authorize the administration of a radiopharmaceutical per Radiology protocol    Answer:   Yes    Order Specific Question:   Preferred imaging location?    Answer:   [place]   CBC with Differential/Platelet   Comprehensive metabolic panel   Lactate dehydrogenase    Standing Status:   Future    Number of Occurrences:   1    Standing Expiration Date:   04/02/2024   CBC with Differential (Cancer Center Only)    Standing Status:   Future    Standing Expiration Date:   04/16/2024  CMP (Cancer Center only)    Standing Status:   Future    Standing Expiration Date:   04/16/2024   T4    Standing Status:   Future    Standing Expiration Date:   04/16/2024   TSH    Standing Status:   Future    Standing Expiration Date:   04/16/2024   Ambulatory referral to General Surgery    Referral Priority:   Routine    Referral Type:   Surgical    Referral Reason:   Specialty Services Required     Requested Specialty:   General Surgery    Number of Visits Requested:   1     60 minutes was spent in patient care.  This included time spent preparing to see the patient (e.g., review of tests), obtaining and/or reviewing separately obtained history, counseling and educating the patient/family/caregiver, ordering medications, tests, or procedures; documenting clinical information in the electronic or other health record, independently interpreting results and communicating results to the patient/family/caregiver as well as coordination of care.       All questions were answered. The patient knows to call the clinic with any problems, questions or concerns.  This note was electronically signed.    Loni Muse, MD  04/03/2023 9:39 AM

## 2023-04-03 NOTE — Progress Notes (Signed)
START ON PATHWAY REGIMEN - Melanoma and Other Skin Cancers     A cycle is every 21 days:     Pembrolizumab   **Always confirm dose/schedule in your pharmacy ordering system**  Patient Characteristics: Melanoma, Cutaneous/Unknown Primary, Postoperative without Neoadjuvant Therapy, M0 (Pathologic Staging), pT4a - pT4b, pN0 Disease Classification: Melanoma Disease Subtype: Cutaneous BRAF V600 Mutation Status: Did Not Order BRAF V600 Test Therapeutic Status: Postoperative without Neoadjuvant Therapy, M0 (Pathologic Staging) AJCC T Category: pT4b AJCC N Category: pN0 AJCC M Category: cM0 AJCC 8 Stage Grouping: IIC Intent of Therapy: Curative Intent, Discussed with Patient

## 2023-04-04 ENCOUNTER — Other Ambulatory Visit: Payer: Self-pay

## 2023-04-04 DIAGNOSIS — L02416 Cutaneous abscess of left lower limb: Secondary | ICD-10-CM | POA: Diagnosis not present

## 2023-04-08 DIAGNOSIS — C4372 Malignant melanoma of left lower limb, including hip: Secondary | ICD-10-CM | POA: Diagnosis not present

## 2023-04-09 ENCOUNTER — Telehealth: Payer: Self-pay | Admitting: *Deleted

## 2023-04-09 NOTE — Telephone Encounter (Signed)
See phone note from 01/2022.  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  Loop is presumably EOL (end of life).  No specific clearance needed for loop implants.

## 2023-04-09 NOTE — Telephone Encounter (Signed)
PT ALREADY SCHEDULED WITH DR. Bing Matter 04/16/23, WILL UPDATE APPOINTMENT INFORMATION.  PT HAS PACEMAKER, SHOULD WE SEND TO DEVICE?       Pre-operative Risk Assessment    Patient Name: Jon Nelson  DOB: May 26, 1946 MRN: [id number]      Request for Surgical Clearance    Procedure:   PORT-A-CATH INSERTION  Date of Surgery:  Clearance 04/21/23                                 Surgeon:  Marvis Moeller, MD Surgeon's Group or Practice Name:  [facility] Phone number:  [phone] Fax number:  [phone]   Type of Clearance Requested:   - Medical  - Pharmacy:  Hold Aspirin and Clopidogrel (Plavix) NOT INDICATED BUT ON MED LIST IT IS MARKED PT NOT TAKING      PT ALSO HAS PACEMAKER, SHOULD WE SEND TO DEVICE CLINIC AS WELL??   Type of Anesthesia:  General    Additional requests/questions:    Signed, Elliot Cousin   04/09/2023, 7:33 AM

## 2023-04-09 NOTE — Telephone Encounter (Signed)
Pre-op Team,   Please send to device.  Thank you!  DW

## 2023-04-12 ENCOUNTER — Ambulatory Visit (HOSPITAL_COMMUNITY)
Admission: RE | Admit: 2023-04-12 | Discharge: 2023-04-12 | Disposition: A | Discharge status: Home / Self Care | Payer: Medicare HMO | Source: Ambulatory Visit | Attending: Oncology | Admitting: Oncology

## 2023-04-12 DIAGNOSIS — C4372 Malignant melanoma of left lower limb, including hip: Secondary | ICD-10-CM | POA: Insufficient documentation

## 2023-04-12 DIAGNOSIS — G319 Degenerative disease of nervous system, unspecified: Secondary | ICD-10-CM | POA: Diagnosis not present

## 2023-04-12 MED ORDER — GADOBUTROL 1 MMOL/ML IV SOLN
8.5000 mL | Freq: Once | INTRAVENOUS | Status: AC | PRN
  Administered 2023-04-12: 8.5 mL via INTRAVENOUS

## 2023-04-14 DIAGNOSIS — C4372 Malignant melanoma of left lower limb, including hip: Secondary | ICD-10-CM | POA: Diagnosis not present

## 2023-04-15 ENCOUNTER — Encounter: Payer: Self-pay | Admitting: Oncology

## 2023-04-15 DIAGNOSIS — E538 Deficiency of other specified B group vitamins: Secondary | ICD-10-CM | POA: Diagnosis not present

## 2023-04-16 ENCOUNTER — Encounter: Payer: Self-pay | Admitting: Cardiology

## 2023-04-16 ENCOUNTER — Telehealth: Payer: Self-pay | Admitting: Oncology

## 2023-04-16 ENCOUNTER — Ambulatory Visit: Payer: Medicare HMO | Attending: Cardiology | Admitting: Cardiology

## 2023-04-16 VITALS — BP 112/64 | HR 88 | Ht 69.0 in | Wt 186.2 lb

## 2023-04-16 DIAGNOSIS — Z951 Presence of aortocoronary bypass graft: Secondary | ICD-10-CM

## 2023-04-16 DIAGNOSIS — Z7984 Long term (current) use of oral hypoglycemic drugs: Secondary | ICD-10-CM | POA: Diagnosis not present

## 2023-04-16 DIAGNOSIS — E782 Mixed hyperlipidemia: Secondary | ICD-10-CM

## 2023-04-16 DIAGNOSIS — I6523 Occlusion and stenosis of bilateral carotid arteries: Secondary | ICD-10-CM

## 2023-04-16 DIAGNOSIS — Z01810 Encounter for preprocedural cardiovascular examination: Secondary | ICD-10-CM

## 2023-04-16 DIAGNOSIS — E1159 Type 2 diabetes mellitus with other circulatory complications: Secondary | ICD-10-CM | POA: Diagnosis not present

## 2023-04-16 NOTE — Progress Notes (Signed)
Cardiology Office Note:    Date:  04/16/2023   ID:  Jon Nelson, DOB Apr 23, 1946, MRN [id number]  PCP:  Patient, No Pcp Per  Cardiologist:  Gypsy Balsam, MD    Referring MD: Maudry Diego, MD   Chief Complaint  Patient presents with   Medical Clearance    Port A Cath Dr. Marvis Moeller     History of Present Illness:    Jon Nelson is a 77 y.o. 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 He presented to [facility] at the end of October with some chest pain rule-year-old with troponin was transferred to [facility], cardiac catheterization has been performed which showed severe triple-vessel disease and eventually coronary artery bypass graft has been performed with LIMA to LAD, SVG to obtuse marginal branch, SVG to PDA  I saw him only once after surgery which was in January 2023 he was doing quite well at the time and then he disappeared from follow-up he was referred back to [place] because he required Port-A-Cath insertion secondary to the fact that he was diagnosed with skin melanoma and he required immunotherapy treatment.  Overall he said he is doing fine denies of any chest pain tightness squeezing pressure burning chest no palpitation dizziness, CVA/TIA-like symptoms.  Patient only has a wound on the left leg which is now open and still draining some pus.  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 [facility])    Old myocardial infarct    Other amnesia    TIA (transient ischemic attack)     Past Surgical History:  Procedure Laterality Date   CATARACT  EXTRACTION Left 08/15/2020   CORONARY ARTERY BYPASS GRAFT N/A 09/24/2021   Procedure: CORONARY ARTERY BYPASS GRAFTING (CABG) TIMES THREE , ON PUMP, USING LEFT INTERNAL MAMMARY ARTERY AND ENDOSCOPICALLY HARVESTED RIGHT GREATER SAPHENOUS VEIN;  Surgeon: Corliss Skains, MD;  Location: [facility] OR;  Service: Open Heart Surgery;  Laterality: N/A;   ENDOVEIN HARVEST OF GREATER SAPHENOUS VEIN Right 09/24/2021   Procedure: ENDOVEIN HARVEST OF GREATER SAPHENOUS VEIN;  Surgeon: Corliss Skains, MD;  Location: [facility] OR;  Service: Open Heart Surgery;  Laterality: Right;   HEMORRHOID SURGERY     HERNIA REPAIR  2021   LEFT HEART CATH AND CORONARY ANGIOGRAPHY N/A 09/18/2021   Procedure: LEFT HEART CATH AND CORONARY ANGIOGRAPHY;  Surgeon: Lyn Records, MD;  Location: [facility] INVASIVE CV LAB;  Service: Cardiovascular;  Laterality: N/A;   LOOP RECORDER INSERTION N/A 02/03/2019   Procedure: LOOP RECORDER INSERTION;  Surgeon: Regan Lemming, MD;  Location: [facility] INVASIVE CV LAB;  Service: Cardiovascular;  Laterality: N/A;   MOLE REMOVAL     SKIN CANCER EXCISION     TEE WITHOUT CARDIOVERSION N/A 02/03/2019   Procedure: TRANSESOPHAGEAL ECHOCARDIOGRAM (TEE);  Surgeon: Jake Bathe, MD;  Location: [facility] ENDOSCOPY;  Service: Cardiovascular;  Laterality: N/A;  loop   TEE WITHOUT CARDIOVERSION N/A 09/24/2021   Procedure: TRANSESOPHAGEAL ECHOCARDIOGRAM (TEE);  Surgeon: Corliss Skains, MD;  Location: [facility] OR;  Service: Open Heart Surgery;  Laterality: N/A;  Current Medications: Current Meds  Medication Sig   acetaminophen (TYLENOL) 500 MG tablet Take 500 mg by mouth every 6 (six) hours as needed for mild pain or moderate pain.   aspirin EC 81 MG EC tablet Take 1 tablet (81 mg total) by mouth daily. Swallow whole.   clopidogrel (PLAVIX) 75 MG tablet Take 1 tablet (75 mg total) by mouth daily.   meloxicam (MOBIC) 15 MG tablet Take 15 mg by mouth daily.   metFORMIN (GLUCOPHAGE) 500 MG tablet Take 1 tablet (500 mg  total) by mouth every morning.   metoprolol tartrate (LOPRESSOR) 25 MG tablet Take 0.5 tablets (12.5 mg total) by mouth 2 (two) times daily.   rosuvastatin (CRESTOR) 40 MG tablet Take 1 tablet (40 mg total) by mouth daily.   traMADol (ULTRAM) 50 MG tablet Take 50 mg by mouth every 6 (six) hours as needed.     Allergies:   Aricept [donepezil]   Social History   Socioeconomic History   Marital status: Widowed    Spouse name: Not on file   Number of children: 0   Years of education: Not on file   Highest education level: Not on file  Occupational History   Occupation: Retired Brewing technologist)  Tobacco Use   Smoking status: Never   Smokeless tobacco: Never  Vaping Use   Vaping Use: Never used  Substance and Sexual Activity   Alcohol use: Never   Drug use: Never   Sexual activity: Not Currently  Other Topics Concern   Not on file  Social History Narrative   Patient's wife passed away in May 05, 2015 due to cancer - she was unable to have children.  Patient has a sister, his niece and her family lives in his home temporarily    Social Determinants of Health   Financial Resource Strain: Low Risk  (01/16/2022)   Overall Financial Resource Strain (CARDIA)    Difficulty of Paying Living Expenses: Not hard at all  Food Insecurity: No Food Insecurity (04/03/2023)   Hunger Vital Sign    Worried About Running Out of Food in the Last Year: Never true    Ran Out of Food in the Last Year: Never true  Transportation Needs: No Transportation Needs (01/16/2022)   PRAPARE - Administrator, Civil Service (Medical): No    Lack of Transportation (Non-Medical): No  Physical Activity: Not on file  Stress: Not on file  Social Connections: Not on file     Family History: The patient's family history includes Alzheimer's disease in his mother; Stroke in his mother. ROS:   Please see the history of present illness.    All 14 point review of systems negative except as described per history of present  illness  EKGs/Labs/Other Studies Reviewed:      Recent Labs: 04/03/2023: ALT 23; BUN 14; Creatinine, Ser 0.74; Hemoglobin 14.8; Platelets 301; Potassium 4.6; Sodium 133  Recent Lipid Panel    Component Value Date/Time   CHOL 122 11/27/2021 0808   TRIG 129 11/27/2021 0808   HDL 31 (L) 11/27/2021 0808   CHOLHDL 3.9 11/27/2021 0808   CHOLHDL 5.5 01/31/2019 0513   VLDL 27 01/31/2019 0513   LDLCALC 68 11/27/2021 0808    Physical Exam:    VS:  BP 112/64 (BP Location: Left Arm, Patient Position: Sitting)   Pulse 88   Ht 5\' 9"  (1.753 m)   Wt 186 lb 3.2 oz (84.5 kg)   SpO2 97%   BMI 27.50 kg/m  Wt Readings from Last 3 Encounters:  04/16/23 186 lb 3.2 oz (84.5 kg)  04/03/23 185 lb 9.6 oz (84.2 kg)  02/07/22 183 lb (83 kg)     GEN:  Well nourished, well developed in no acute distress HEENT: Normal NECK: No JVD; No carotid bruits LYMPHATICS: No lymphadenopathy CARDIAC: RRR, no murmurs, no rubs, no gallops RESPIRATORY:  Clear to auscultation without rales, wheezing or rhonchi  ABDOMEN: Soft, non-tender, non-distended MUSCULOSKELETAL:  No edema; No deformity  SKIN: Warm and dry LOWER EXTREMITIES: no swelling NEUROLOGIC:  Alert and oriented x 3 PSYCHIATRIC:  Normal affect   ASSESSMENT:    1. Bilateral carotid artery stenosis   2. S/P CABG x 3   3. Type 2 diabetes mellitus with other circulatory complication, without long-term current use of insulin (HCC)   4. Mixed hyperlipidemia   5. Preop cardiovascular exam    PLAN:    In order of problems listed above:  Bilateral carotic arterial stenosis detected previously before bypass surgery more than year ago.  Will schedule him to have carotic ultrasound.  He is on dual antiplatelet therapy which she will be able to reduce to only single monotherapy however I would like to see carotic ultrasounds first. Status post coronary bypass graft seems to be doing well from that point we will continue present management. Dyslipidemia  I did review K PN which show me his LDL 1 4 HDL 26 this is from March 11, 2023 he is taking Crestor 40 however he is not able to answer my question if he was taking Crestor when this cholesterol has been taken therefore, I will schedule him to have another fasting lipid profile done in about a month. Cardiovascular preop evaluation overall Port-A-Cath insertion from cardiac standpoint review is a very low risk procedure and there is no really clear as needed in terms of dual antiplatelet therapy that can be temporarily held for 5 to 7 days before surgery and started as soon as feasible from surgical point of view.  We can actually restart him only on aspirin thereafter.   Medication Adjustments/Labs and Tests Ordered: Current medicines are reviewed at length with the patient today.  Concerns regarding medicines are outlined above.  No orders of the defined types were placed in this encounter.  Medication changes: No orders of the defined types were placed in this encounter.   Signed, [name], MD, [facility] 04/16/2023 2:16 PM    [facility]

## 2023-04-16 NOTE — Telephone Encounter (Signed)
Unable to reach pt via cell number listed and vm not available... I contacted the home number and was told he was not at home.   Scheduling Message Entered by Domenic Schwab on 04/15/2023 at  1:28 PM Priority: Routine <No visit type provided>  Department: [facility]  Provider:  Appointment Notes:  Please schedule pt for infusion per treatment plan.  Looks like port placement is scheduled for 5/20 so preferably 5/21. Pt will need chemo ed and lab appt prior.  Scheduling Notes:

## 2023-04-16 NOTE — Patient Instructions (Signed)
Medication Instructions:  Your physician recommends that you continue on your current medications as directed. Please refer to the Current Medication list given to you today.  *If you need a refill on your cardiac medications before your next appointment, please call your pharmacy*   Lab Work: Your physician recommends that you return for lab work in:   Labs in 1 month: Lipid, LFT  If you have labs (blood work) drawn today and your tests are completely normal, you will receive your results only by: MyChart Message (if you have MyChart) OR A paper copy in the mail If you have any lab test that is abnormal or we need to change your treatment, we will call you to review the results.   Testing/Procedures: Your physician has requested that you have a carotid duplex. This test is an ultrasound of the carotid arteries in your neck. It looks at blood flow through these arteries that supply the brain with blood. Allow one hour for this exam. There are no restrictions or special instructions.    Follow-Up: At [facility], you and your health needs are our priority.  As part of our continuing mission to provide you with exceptional heart care, we have created designated Provider Care Teams.  These Care Teams include your primary Cardiologist (physician) and Advanced Practice Providers (APPs -  Physician Assistants and Nurse Practitioners) who all work together to provide you with the care you need, when you need it.  We recommend signing up for the patient portal called "MyChart".  Sign up information is provided on this After Visit Summary.  MyChart is used to connect with patients for Virtual Visits (Telemedicine).  Patients are able to view lab/test results, encounter notes, upcoming appointments, etc.  Non-urgent messages can be sent to your provider as well.   To learn more about what you can do with MyChart, go to [email or web address].    Your next appointment:   6  month(s)  Provider:   Gypsy Balsam, MD    Other Instructions None

## 2023-04-17 ENCOUNTER — Ambulatory Visit: Payer: Medicare HMO | Admitting: Oncology

## 2023-04-22 DIAGNOSIS — L02416 Cutaneous abscess of left lower limb: Secondary | ICD-10-CM | POA: Diagnosis not present

## 2023-04-24 DIAGNOSIS — Z7984 Long term (current) use of oral hypoglycemic drugs: Secondary | ICD-10-CM | POA: Diagnosis not present

## 2023-04-24 DIAGNOSIS — K219 Gastro-esophageal reflux disease without esophagitis: Secondary | ICD-10-CM | POA: Diagnosis not present

## 2023-04-24 DIAGNOSIS — Z955 Presence of coronary angioplasty implant and graft: Secondary | ICD-10-CM | POA: Diagnosis not present

## 2023-04-24 DIAGNOSIS — I252 Old myocardial infarction: Secondary | ICD-10-CM | POA: Diagnosis not present

## 2023-04-24 DIAGNOSIS — C4372 Malignant melanoma of left lower limb, including hip: Secondary | ICD-10-CM | POA: Diagnosis not present

## 2023-04-24 DIAGNOSIS — I69931 Monoplegia of upper limb following unspecified cerebrovascular disease affecting right dominant side: Secondary | ICD-10-CM | POA: Diagnosis not present

## 2023-04-24 DIAGNOSIS — Z452 Encounter for adjustment and management of vascular access device: Secondary | ICD-10-CM | POA: Diagnosis not present

## 2023-04-24 DIAGNOSIS — Z95828 Presence of other vascular implants and grafts: Secondary | ICD-10-CM | POA: Diagnosis not present

## 2023-04-24 DIAGNOSIS — E119 Type 2 diabetes mellitus without complications: Secondary | ICD-10-CM | POA: Diagnosis not present

## 2023-04-24 DIAGNOSIS — D0372 Melanoma in situ of left lower limb, including hip: Secondary | ICD-10-CM | POA: Diagnosis not present

## 2023-04-24 DIAGNOSIS — I10 Essential (primary) hypertension: Secondary | ICD-10-CM | POA: Diagnosis not present

## 2023-04-25 ENCOUNTER — Other Ambulatory Visit (HOSPITAL_COMMUNITY): Payer: Medicare HMO

## 2023-04-25 NOTE — Progress Notes (Unsigned)
[facility] Cancer Initial Visit:  Patient Care Team: Patient, No Pcp Per as PCP - General (General Practice) [name], MD as PCP - Cardiology (Cardiology) Jenene Slicker, Deberah Castle, MD as Consulting Physician (Optometry) Olegario Shearer, MD as Referring Physician (Dermatology)  CHIEF COMPLAINTS/PURPOSE OF CONSULTATION:  Oncology History  Malignant melanoma (HCC)  04/03/2023 Initial Diagnosis   Malignant melanoma (HCC)   04/03/2023 Cancer Staging   Staging form: Melanoma of the Skin, AJCC 8th Edition - Clinical stage from 04/03/2023: Stage IIC (cT4b, cN0, cM0) - Signed by Loni Muse, MD on 04/03/2023 Histopathologic type: Nodular melanoma Stage prefix: Initial diagnosis   04/22/2023 -  Chemotherapy   Patient is on Treatment Plan : MELANOMA Pembrolizumab (200) q21d       HISTORY OF PRESENTING ILLNESS: Jon Nelson 77 y.o. 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  Breslow depth 5.1 mm.  Clark's level 4.  Ulceration present.  Mitotic index 16/mm.  Pathologic stage T4b.  Deep margin involved  Apr 03 2023:  [facility] Medical Oncology Consult Patient here with sister in law [name] who is also his power of attorney.  He has had a mole on the left lateral leg for years but in the last year it degenerated into a sore which didn't heal which prompted him to see a dermatologist.  He has been followed by dermatology for removal of non-melanomatous skin cancers on his face and arms over the years.  He underwent a shave biopsy of the left leg lesion on March 19 2023.  He has not been noted to have in transit lesions.  April 01 2023 he underwent a wide local excision of the region but did not undergo inguinal lymphadenectomy.     Social:  Widowed.  Lives with his dog.   Worked for [employer] where he worked outside.  No smoking.  EtOH none  [employer] [facility] Mother died 78 dementia Father estranged No siblings.    Review of Systems  Constitutional:  Negative for appetite change, chills, fatigue, fever and unexpected weight change.  HENT:   Positive for hearing loss. Negative for lump/mass, mouth sores, nosebleeds, sore throat and trouble swallowing.        Deaf left ear  Eyes:  Positive for eye problems. Negative for icterus.       Vision changes:  None  Respiratory:  Negative for chest tightness, cough, hemoptysis and wheezing.        DOE on walking up hill  Cardiovascular:  Positive for leg swelling. Negative for chest pain and palpitations.       PND:  none Orthopnea:  none  Gastrointestinal:  Negative for abdominal distention, abdominal pain, blood in stool, constipation, diarrhea, nausea and vomiting.  Endocrine: Negative for hot flashes.       Cold intolerance:  none Heat intolerance:  none  Genitourinary:  Positive for frequency. Negative for bladder incontinence, difficulty urinating, dysuria and hematuria.        Nocturia x 3 to 4  Musculoskeletal:  Negative for arthralgias, back pain, gait problem, myalgias, neck pain and neck stiffness.  Skin:  Negative for rash.       Chronic pruritus of arms.  Healing from surgery to left leg  Neurological:  Negative for extremity weakness, gait problem, headaches, numbness and speech difficulty.  Occasional dizziness particularly when bending over.  No falls   Hematological:  Negative for adenopathy. Does not bruise/bleed easily.  Psychiatric/Behavioral:  Negative for sleep disturbance and suicidal ideas. The patient is not nervous/anxious.     MEDICAL HISTORY: Past Medical History:  Diagnosis Date  . Carotid artery stenosis   . Coronary artery stenosis   . Diabetes (HCC)   . Diabetes mellitus with cardiac complication (HCC)   . GI bleed   . HLD (hyperlipidemia)   . HTN (hypertension)   . Mixed  hyperlipidemia   . 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 Old myocardial infarct   . Other amnesia   . TIA (transient ischemic attack)     SURGICAL HISTORY: Past Surgical History:  Procedure Laterality Date  . CATARACT EXTRACTION Left 08/15/2020  . CORONARY ARTERY BYPASS GRAFT N/A 09/24/2021   Procedure: CORONARY ARTERY BYPASS GRAFTING (CABG) TIMES THREE , ON PUMP, USING LEFT INTERNAL MAMMARY ARTERY AND ENDOSCOPICALLY HARVESTED RIGHT GREATER SAPHENOUS VEIN;  Surgeon: Corliss Skains, MD;  Location: [facility] OR;  Service: Open Heart Surgery;  Laterality: N/A;  . ENDOVEIN HARVEST OF GREATER SAPHENOUS VEIN Right 09/24/2021   Procedure: ENDOVEIN HARVEST OF GREATER SAPHENOUS VEIN;  Surgeon: Corliss Skains, MD;  Location: [facility] OR;  Service: Open Heart Surgery;  Laterality: Right;  . HEMORRHOID SURGERY    . HERNIA REPAIR  19-May-2020  . LEFT HEART CATH AND CORONARY ANGIOGRAPHY N/A 09/18/2021   Procedure: LEFT HEART CATH AND CORONARY ANGIOGRAPHY;  Surgeon: Lyn Records, MD;  Location: [facility] INVASIVE CV LAB;  Service: Cardiovascular;  Laterality: N/A;  . LOOP RECORDER INSERTION N/A 02/03/2019   Procedure: LOOP RECORDER INSERTION;  Surgeon: Regan Lemming, MD;  Location: [facility] INVASIVE CV LAB;  Service: Cardiovascular;  Laterality: N/A;  . MOLE REMOVAL    . SKIN CANCER EXCISION    . TEE WITHOUT CARDIOVERSION N/A 02/03/2019   Procedure: TRANSESOPHAGEAL ECHOCARDIOGRAM (TEE);  Surgeon: Jake Bathe, MD;  [facility] ENDOSCOPY;  Service: Cardiovascular;  Laterality: N/A;  loop  . TEE WITHOUT CARDIOVERSION N/A 09/24/2021   Procedure: TRANSESOPHAGEAL ECHOCARDIOGRAM (TEE);  Surgeon: Corliss Skains, MD;  Location: [facility] OR;  Service: Open Heart Surgery;  Laterality: N/A;    SOCIAL HISTORY: Social History   Socioeconomic History  . Marital status: Widowed    Spouse name: Not on file  . Number of children: 0  . Years of education: Not  on file  . Highest education level: Not on file  Occupational History  . Occupation: Retired Brewing technologist)  Tobacco Use  . Smoking status: Never  . Smokeless tobacco: Never  Vaping Use  . Vaping Use: Never used  Substance and Sexual Activity  . Alcohol use: Never  . Drug use: Never  . Sexual activity: Not Currently  Other Topics Concern  . Not on file  Social History Narrative   Patient's wife passed away in May 20, 2015 due to cancer - she was unable to have children.  Patient has a sister, his niece and her family lives in his home temporarily    Social Determinants of Health   Financial Resource Strain: Low Risk  (01/16/2022)   Overall Financial Resource Strain (CARDIA)   . Difficulty of Paying Living Expenses: Not hard at all  Food Insecurity: No Food Insecurity (04/03/2023)   Hunger Vital Sign   . Worried About Programme researcher, broadcasting/film/video in the Last Year: Never true   . Ran Out of  Food in the Last Year: Never true  Transportation Needs: No Transportation Needs (01/16/2022)   PRAPARE - Transportation   . Lack of Transportation (Medical): No   . Lack of Transportation (Non-Medical): No  Physical Activity: Not on file  Stress: Not on file  Social Connections: Not on file  Intimate Partner Violence: Not At Risk (04/03/2023)   Humiliation, Afraid, Rape, and Kick questionnaire   . Fear of Current or Ex-Partner: No   . Emotionally Abused: No   . Physically Abused: No   . Sexually Abused: No    FAMILY HISTORY Family History  Problem Relation Age of Onset  . Stroke Mother   . Alzheimer's disease Mother     ALLERGIES:  is allergic to aricept [donepezil].  MEDICATIONS:  Current Outpatient Medications  Medication Sig Dispense Refill  . acetaminophen (TYLENOL) 500 MG tablet Take 500 mg by mouth every 6 (six) hours as needed for mild pain or moderate pain.    Marland Kitchen aspirin EC 81 MG EC tablet Take 1 tablet (81 mg total) by mouth daily. Swallow whole.    . clopidogrel (PLAVIX) 75 MG tablet Take 1  tablet (75 mg total) by mouth daily. 30 tablet 1  . meloxicam (MOBIC) 15 MG tablet Take 15 mg by mouth daily.    . metFORMIN (GLUCOPHAGE) 500 MG tablet Take 1 tablet (500 mg total) by mouth every morning. 90 tablet 2  . metoprolol tartrate (LOPRESSOR) 25 MG tablet Take 0.5 tablets (12.5 mg total) by mouth 2 (two) times daily. 30 tablet 1  . rosuvastatin (CRESTOR) 40 MG tablet Take 1 tablet (40 mg total) by mouth daily. 90 tablet 1  . traMADol (ULTRAM) 50 MG tablet Take 50 mg by mouth every 6 (six) hours as needed.     No current facility-administered medications for this visit.    PHYSICAL EXAMINATION:  ECOG PERFORMANCE STATUS: 1 - Symptomatic but completely ambulatory   There were no vitals filed for this visit.   There were no vitals filed for this visit.    Physical Exam Vitals and nursing note reviewed.  Constitutional:      Appearance: Normal appearance. He is not diaphoretic.     Comments: Here with sister in law  HENT:     Head: Normocephalic and atraumatic.     Right Ear: External ear normal.     Left Ear: External ear normal.     Nose: Nose normal.  Eyes:     Conjunctiva/sclera: Conjunctivae normal.     Pupils: Pupils are equal, round, and reactive to light.  Cardiovascular:     Rate and Rhythm: Normal rate and regular rhythm.     Heart sounds:     No friction rub. No gallop.  Pulmonary:     Effort: Pulmonary effort is normal. No respiratory distress.     Breath sounds: Normal breath sounds. No stridor.  Abdominal:     General: Abdomen is flat. There is no distension.     Palpations: Abdomen is soft.     Tenderness: There is no abdominal tenderness. There is no guarding.  Musculoskeletal:        General: No swelling. Normal range of motion.     Cervical back: Normal range of motion and neck supple. No rigidity or tenderness.     Right lower leg: Edema present.     Left lower leg: Edema present.  Lymphadenopathy:     Head:     Right side of head: No  submental, submandibular, tonsillar,  preauricular, posterior auricular or occipital adenopathy.     Left side of head: No submental, submandibular, tonsillar, preauricular, posterior auricular or occipital adenopathy.     Cervical: No cervical adenopathy.     Right cervical: No superficial, deep or posterior cervical adenopathy.    Left cervical: No superficial, deep or posterior cervical adenopathy.     Upper Body:     Right upper body: No supraclavicular or axillary adenopathy.     Left upper body: No supraclavicular or axillary adenopathy.     Lower Body: No right inguinal adenopathy. No left inguinal adenopathy.  Skin:    Coloration: Skin is not jaundiced.     Findings: No bruising or erythema.     Comments: Fitzpatrick 2 skin tone.  Significant solar damage to face and arms.   Left leg bandaged.    Neurological:     General: No focal deficit present.     Mental Status: He is alert and oriented to person, place, and time.     Comments: Hard of hearing  Psychiatric:        Mood and Affect: Mood normal.        Behavior: Behavior normal.        Thought Content: Thought content normal.        Judgment: Judgment normal.    LABORATORY DATA: I have personally reviewed the data as listed:  Appointment on 04/03/2023  Component Date Value Ref Range Status  . LDH 04/03/2023 129  98 - 192 U/L Final   Performed at [facility], [address] [place], [place] [address]  Office Visit on 04/03/2023  Component Date Value Ref Range Status  . WBC 04/03/2023 10.7 (H)  4.0 - 10.5 K/uL Final  . RBC 04/03/2023 5.02  4.22 - 5.81 MIL/uL Final  . Hemoglobin 04/03/2023 14.8  13.0 - 17.0 g/dL Final  . HCT 60/45/4098 44.6  39.0 - 52.0 % Final  . MCV 04/03/2023 88.8  80.0 - 100.0 fL Final  . MCH 04/03/2023 29.5  26.0 - 34.0 pg Final  . MCHC 04/03/2023 33.2  30.0 - 36.0 g/dL Final  . RDW 11/91/4782 13.3  11.5 - 15.5 % Final  . Platelets 04/03/2023 301  150 - 400 K/uL Final  .  nRBC 04/03/2023 0.0  0.0 - 0.2 % Final  . Neutrophils Relative % 04/03/2023 54  % Final  . Neutro Abs 04/03/2023 5.9  1.7 - 7.7 K/uL Final  . Lymphocytes Relative 04/03/2023 29  % Final  . Lymphs Abs 04/03/2023 3.1  0.7 - 4.0 K/uL Final  . Monocytes Relative 04/03/2023 10  % Final  . Monocytes Absolute 04/03/2023 1.0  0.1 - 1.0 K/uL Final  . Eosinophils Relative 04/03/2023 4  % Final  . Eosinophils Absolute 04/03/2023 0.4  0.0 - 0.5 K/uL Final  . Basophils Relative 04/03/2023 1  % Final  . Basophils Absolute 04/03/2023 0.1  0.0 - 0.1 K/uL Final  . Immature Granulocytes 04/03/2023 2  % Final  . Abs Immature Granulocytes 04/03/2023 0.18 (H)  0.00 - 0.07 K/uL Final   Performed at [facility], [address], [place], [place] [address]  . Sodium 04/03/2023 133 (L)  135 - 145 mmol/L Final  . Potassium 04/03/2023 4.6  3.5 - 5.1 mmol/L Final  . Chloride 04/03/2023 100  98 - 111 mmol/L Final  . CO2 04/03/2023 25  22 - 32 mmol/L Final  . Glucose, Bld 04/03/2023 203 (H)  70 - 99 mg/dL  Final   Glucose reference range applies only to samples taken after fasting for at least 8 hours.  . BUN 04/03/2023 14  8 - 23 mg/dL Final  . Creatinine, Ser 04/03/2023 0.74  0.61 - 1.24 mg/dL Final  . Calcium 16/09/9603 8.4 (L)  8.9 - 10.3 mg/dL Final  . Total Protein 04/03/2023 6.8  6.5 - 8.1 g/dL Final  . Albumin 54/08/8118 3.5  3.5 - 5.0 g/dL Final  . AST 14/78/2956 20  15 - 41 U/L Final  . ALT 04/03/2023 23  0 - 44 U/L Final  . Alkaline Phosphatase 04/03/2023 106  38 - 126 U/L Final  . Total Bilirubin 04/03/2023 0.7  0.3 - 1.2 mg/dL Final  . GFR, Estimated 04/03/2023 >60  >60 mL/min Final   Comment: (NOTE) Calculated using the CKD-EPI Creatinine Equation (2021)   . Anion gap 04/03/2023 8  5 - 15 Final   Performed at [facility], [address], [place], [place] [address]    RADIOGRAPHIC STUDIES: I have personally reviewed the radiological images as listed and  agree with the findings in the report  No results found.  ASSESSMENT/PLAN 77 y.o. male is here because of  malignant melanoma.  Medical history notable for diabetes mellitus, coronary artery disease, cerebrovascular disease, MI, cataracts  Malignant melanoma, distal left leg, Stage IIC (T4b N0 M0)- Risk factors for this patient are 1) chronic exposure to UV 2) Sunburns 3) Fitzpatrick Grade 2 skin tone March 19, 2023: Shave biopsy left lateral leg demonstrated malignant melanoma, nodular type. Breslow depth 5.1 mm. Clark's level 4. Ulceration present. Mitotic index 16/mm. Pathologic stage T4b. Deep margin involved  April 01 2023- Wide local excision.  Sentinel LN bx not performed due to patient frailty  Therapeutics   Apr 03 2023- Will obtain CBC with diff, CMP, LDH, MRI brain and whole body PET/CT for staging  Patient meets criteria per NCCN Guidelines for adjuvant immunotherapy and will plan on adjuvant Pembrolizumab  Will need chemotherapy teaching  Poor IV access  Apr 03 2023- Referring for portacath placement     Cancer Staging  Malignant melanoma [facility]) Staging form: Melanoma of the Skin, AJCC 8th Edition - Clinical stage from 04/03/2023: Stage IIC (cT4b, cN0, cM0) - Signed by Loni Muse, MD on 04/03/2023 Histopathologic type: Nodular melanoma Stage prefix: Initial diagnosis    No problem-specific Assessment & Plan notes found for this encounter.    No orders of the defined types were placed in this encounter.    60 minutes was spent in patient care.  This included time spent preparing to see the patient (e.g., review of tests), obtaining and/or reviewing separately obtained history, counseling and educating the patient/family/caregiver, ordering medications, tests, or procedures; documenting clinical information in the electronic or other health record, independently interpreting results and communicating results to the patient/family/caregiver as well as coordination of  care.       All questions were answered. The patient knows to call the clinic with any problems, questions or concerns.  This note was electronically signed.    Loni Muse, MD  04/25/2023 1:03 PM

## 2023-04-29 ENCOUNTER — Inpatient Hospital Stay: Payer: Medicare HMO | Admitting: Oncology

## 2023-04-29 ENCOUNTER — Inpatient Hospital Stay: Payer: Medicare HMO

## 2023-04-29 ENCOUNTER — Encounter: Payer: Self-pay | Admitting: Oncology

## 2023-04-29 VITALS — BP 117/63 | HR 77 | Temp 98.0°F | Resp 16 | Ht 69.0 in | Wt 186.3 lb

## 2023-04-29 DIAGNOSIS — I2510 Atherosclerotic heart disease of native coronary artery without angina pectoris: Secondary | ICD-10-CM | POA: Diagnosis not present

## 2023-04-29 DIAGNOSIS — Z79899 Other long term (current) drug therapy: Secondary | ICD-10-CM | POA: Diagnosis not present

## 2023-04-29 DIAGNOSIS — R54 Age-related physical debility: Secondary | ICD-10-CM | POA: Diagnosis not present

## 2023-04-29 DIAGNOSIS — C4372 Malignant melanoma of left lower limb, including hip: Secondary | ICD-10-CM

## 2023-04-29 DIAGNOSIS — I10 Essential (primary) hypertension: Secondary | ICD-10-CM | POA: Diagnosis not present

## 2023-04-29 DIAGNOSIS — I878 Other specified disorders of veins: Secondary | ICD-10-CM | POA: Diagnosis not present

## 2023-04-29 DIAGNOSIS — I252 Old myocardial infarction: Secondary | ICD-10-CM | POA: Diagnosis not present

## 2023-04-29 DIAGNOSIS — E1159 Type 2 diabetes mellitus with other circulatory complications: Secondary | ICD-10-CM | POA: Diagnosis not present

## 2023-04-29 LAB — CBC WITH DIFFERENTIAL/PLATELET
Abs Immature Granulocytes: 0.15 10*3/uL — ABNORMAL HIGH (ref 0.00–0.07)
Basophils Absolute: 0 10*3/uL (ref 0.0–0.1)
Basophils Relative: 0 %
Eosinophils Absolute: 0.3 10*3/uL (ref 0.0–0.5)
Eosinophils Relative: 3 %
HCT: 41.8 % (ref 39.0–52.0)
Hemoglobin: 13.9 g/dL (ref 13.0–17.0)
Immature Granulocytes: 2 %
Lymphocytes Relative: 34 %
Lymphs Abs: 3.3 10*3/uL (ref 0.7–4.0)
MCH: 29.3 pg (ref 26.0–34.0)
MCHC: 33.3 g/dL (ref 30.0–36.0)
MCV: 88 fL (ref 80.0–100.0)
Monocytes Absolute: 1.1 10*3/uL — ABNORMAL HIGH (ref 0.1–1.0)
Monocytes Relative: 11 %
Neutro Abs: 4.8 10*3/uL (ref 1.7–7.7)
Neutrophils Relative %: 50 %
Platelets: 360 10*3/uL (ref 150–400)
RBC: 4.75 MIL/uL (ref 4.22–5.81)
RDW: 13.1 % (ref 11.5–15.5)
WBC: 9.6 10*3/uL (ref 4.0–10.5)
nRBC: 0 % (ref 0.0–0.2)

## 2023-04-29 LAB — COMPREHENSIVE METABOLIC PANEL
ALT: 17 U/L (ref 0–44)
AST: 18 U/L (ref 15–41)
Albumin: 3.3 g/dL — ABNORMAL LOW (ref 3.5–5.0)
Alkaline Phosphatase: 93 U/L (ref 38–126)
Anion gap: 8 (ref 5–15)
BUN: 12 mg/dL (ref 8–23)
CO2: 24 mmol/L (ref 22–32)
Calcium: 8.6 mg/dL — ABNORMAL LOW (ref 8.9–10.3)
Chloride: 103 mmol/L (ref 98–111)
Creatinine, Ser: 0.57 mg/dL — ABNORMAL LOW (ref 0.61–1.24)
GFR, Estimated: >60 mL/min (ref >60)
Glucose, Bld: 137 mg/dL — ABNORMAL HIGH (ref 70–99)
Potassium: 3.9 mmol/L (ref 3.5–5.1)
Sodium: 135 mmol/L (ref 135–145)
Total Bilirubin: 0.5 mg/dL (ref 0.3–1.2)
Total Protein: 6.5 g/dL (ref 6.5–8.1)

## 2023-04-29 LAB — TSH: TSH: 2.636 u[IU]/mL (ref 0.350–4.500)

## 2023-04-29 LAB — CORTISOL: Cortisol, Plasma: 8.4 ug/dL

## 2023-04-29 LAB — T4, FREE: Free T4: 0.76 ng/dL (ref 0.61–1.12)

## 2023-04-30 ENCOUNTER — Telehealth: Payer: Self-pay | Admitting: Oncology

## 2023-04-30 ENCOUNTER — Encounter: Payer: Self-pay | Admitting: Oncology

## 2023-04-30 DIAGNOSIS — I878 Other specified disorders of veins: Secondary | ICD-10-CM | POA: Insufficient documentation

## 2023-04-30 NOTE — Telephone Encounter (Signed)
04/30/23 Spoke with patient and scheduled next appt

## 2023-05-01 NOTE — Progress Notes (Signed)
[facility] CARE CLINIC CONSULT NOTE [facility] [phone]   [phone]   Patient Care Team: Patient, No Pcp Per as PCP - General (General Practice) [name], MD as PCP - Cardiology (Cardiology) Jenene Slicker, Deberah Castle, MD as Consulting Physician (Optometry) Olegario Shearer, MD as Referring Physician (Dermatology) Loni Muse, MD as Consulting Physician (Internal Medicine)   Name of the patient: Jon Nelson  [id number]  1946/08/25   Date of visit: 05/06/23  Diagnosis: Melanoma  Chief complaint/Reason for visit- Initial Meeting for [facility], preparing for starting immunotherapy  Heme/Onc history:  Oncology History  Malignant melanoma (HCC)  04/03/2023 Initial Diagnosis   Malignant melanoma (HCC)   04/03/2023 Cancer Staging   Staging form: Melanoma of the Skin, AJCC 8th Edition - Clinical stage from 04/03/2023: Stage IIC (cT4b, cN0, cM0) - Signed by Loni Muse, MD on 04/03/2023 Histopathologic type: Nodular melanoma Stage prefix: Initial diagnosis   04/22/2023 -  Chemotherapy   Patient is on Treatment Plan : MELANOMA Pembrolizumab (200) q21d       Interval history-  The patient presents to chemo care clinic today for initial meeting in preparation for starting immunotherapy. I introduced the chemo care clinic and we discussed that the role of the clinic is to assist those who are at an increased risk of emergency room visits and/or complications during the course of treatment. We discussed that the increased risk takes into account factors such as age, performance status, and co-morbidities. We also discussed that for some, this might include barriers to care such as not having a primary care provider, lack of insurance/transportation, or not being able to afford medications. We discussed that the goal of the program is to help prevent unplanned ER visits and help reduce complications during immunotherapy. We do this  by discussing specific risk factors to each individual and identifying ways that we can help improve these risk factors and reduce barriers to care.   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)    Old myocardial infarct    Other amnesia    TIA (transient ischemic attack)     Past Surgical History:  Procedure Laterality Date   CATARACT EXTRACTION Left 08/15/2020   CORONARY ARTERY BYPASS GRAFT N/A 09/24/2021   Procedure: CORONARY ARTERY BYPASS GRAFTING (CABG) TIMES THREE , ON PUMP, USING LEFT INTERNAL MAMMARY ARTERY AND ENDOSCOPICALLY HARVESTED RIGHT GREATER SAPHENOUS VEIN;  Surgeon: Corliss Skains, MD;  [facility] OR;  Service: Open Heart Surgery;  Laterality: N/A;   ENDOVEIN HARVEST OF GREATER SAPHENOUS VEIN Right 09/24/2021   Procedure: ENDOVEIN HARVEST OF GREATER SAPHENOUS VEIN;  Surgeon: Corliss Skains, MD;  Location: [facility] OR;  Service: Open Heart Surgery;  Laterality: Right;   HEMORRHOID SURGERY     HERNIA REPAIR  2021   LEFT HEART CATH AND CORONARY ANGIOGRAPHY N/A 09/18/2021   Procedure: LEFT HEART CATH AND CORONARY ANGIOGRAPHY;  Surgeon: Lyn Records, MD;  Location: [facility] INVASIVE CV LAB;  Service: Cardiovascular;  Laterality: N/A;   LOOP RECORDER INSERTION N/A 02/03/2019  Procedure: LOOP RECORDER INSERTION;  Surgeon: Regan Lemming, MD;  Location: [facility] INVASIVE CV LAB;  Service: Cardiovascular;  Laterality: N/A;   MOLE REMOVAL     PORTA CATH INSERTION     SKIN CANCER EXCISION     TEE WITHOUT CARDIOVERSION N/A 02/03/2019   Procedure: TRANSESOPHAGEAL ECHOCARDIOGRAM (TEE);  Surgeon: Jake Bathe, MD;  Location[facility] ENDOSCOPY;  Service: Cardiovascular;  Laterality: N/A;  loop   TEE WITHOUT CARDIOVERSION N/A 09/24/2021   Procedure: TRANSESOPHAGEAL ECHOCARDIOGRAM (TEE);  Surgeon: Corliss Skains, MD;  Location: [facility] OR;  Service: Open Heart Surgery;  Laterality: N/A;    Social History   Socioeconomic History   Marital status: Widowed    Spouse name: Not on file   Number of children: 0   Years of education: Not on file   Highest education level: Not on file  Occupational History   Occupation: Retired Brewing technologist)  Tobacco Use   Smoking status: Never   Smokeless tobacco: Never  Vaping Use   Vaping Use: Never used  Substance and Sexual Activity   Alcohol use: Never   Drug use: Never   Sexual activity: Not Currently  Other Topics Concern   Not on file  Social History Narrative   Patient's wife passed away in 2015/05/14 due to cancer - she was unable to have children.  Patient has a sister, his niece and her family lives in his home temporarily    Social Determinants of Health   Financial Resource Strain: Low Risk  (01/16/2022)   Overall Financial Resource Strain (CARDIA)    Difficulty of Paying Living Expenses: Not hard at all  Food Insecurity: No Food Insecurity (04/03/2023)   Hunger Vital Sign    Worried About Running Out of Food in the Last Year: Never true    Ran Out of Food in the Last Year: Never true  Transportation Needs: No Transportation Needs (01/16/2022)   PRAPARE - Administrator, Civil Service (Medical): No    Lack of Transportation (Non-Medical): No  Physical Activity: Not on file  Stress: Not on file  Social Connections: Not on file  Intimate Partner Violence: Not At Risk (04/03/2023)   Humiliation, Afraid, Rape, and Kick questionnaire    Fear of Current or Ex-Partner: No    Emotionally Abused: No    Physically Abused: No    Sexually Abused: No    Family History  Problem Relation Age of Onset   Stroke Mother    Alzheimer's disease Mother       Current Outpatient Medications:    acetaminophen (TYLENOL) 500 MG tablet, Take 500 mg by mouth every 6 (six) hours as needed for mild pain or moderate pain., Disp: , Rfl:    aspirin EC 81 MG EC tablet, Take 1 tablet (81 mg total) by mouth daily. Swallow whole., Disp: , Rfl:    clopidogrel (PLAVIX) 75 MG tablet, Take 1 tablet (75 mg total) by mouth daily., Disp: 30 tablet, Rfl: 1   finasteride (PROSCAR) 5 MG tablet, Take 1 tablet (5 mg total) by mouth daily., Disp: 30 tablet, Rfl: 5   meloxicam (MOBIC) 15 MG tablet, Take 15 mg by mouth daily., Disp: , Rfl:    metFORMIN (GLUCOPHAGE) 500 MG tablet, Take 1 tablet (500 mg total) by mouth every morning., Disp: 90 tablet, Rfl: 2   metoprolol tartrate (LOPRESSOR) 25 MG tablet, Take 0.5 tablets (12.5 mg total) by mouth 2 (two) times daily., Disp: 30 tablet, Rfl: 1   rosuvastatin (  CRESTOR) 40 MG tablet, Take 1 tablet (40 mg total) by mouth daily., Disp: 90 tablet, Rfl: 1   traMADol (ULTRAM) 50 MG tablet, Take 50 mg by mouth every 6 (six) hours as needed., Disp: , Rfl:      Latest Ref Rng & Units 04/29/2023    4:10 PM  CMP  Glucose 70 - 99 mg/dL 161   BUN 8 - 23 mg/dL 12   Creatinine 0.96 - 1.24 mg/dL 0.45   Sodium 409 - 811 mmol/L 135   Potassium 3.5 - 5.1 mmol/L 3.9   Chloride 98 - 111 mmol/L 103   CO2 22 - 32 mmol/L 24   Calcium 8.9 - 10.3 mg/dL 8.6   Total Protein 6.5 - 8.1 g/dL 6.5   Total Bilirubin 0.3 - 1.2 mg/dL 0.5   Alkaline Phos 38 - 126 U/L 93   AST 15 - 41 U/L 18   ALT 0 - 44 U/L 17       Latest Ref Rng & Units 04/29/2023    4:10 PM  CBC  WBC 4.0 - 10.5 K/uL 9.6   Hemoglobin 13.0 - 17.0 g/dL 91.4   Hematocrit 78.2 - 52.0 % 41.8   Platelets 150 - 400 K/uL 360     No images are attached to the encounter.  MR Brain W Wo Contrast  Result Date: 04/18/2023 CLINICAL DATA:  Melanoma monitoring.  Metastatic screening EXAM: MRI HEAD WITHOUT AND WITH CONTRAST TECHNIQUE: Multiplanar, multiecho pulse sequences of the brain and  surrounding structures were obtained without and with intravenous contrast. CONTRAST:  8.60mL GADAVIST GADOBUTROL 1 MMOL/ML IV SOLN COMPARISON:  01/31/2019 FINDINGS: Brain: No mass or swelling to suggest metastatic disease. Extensive chronic small vessel ischemia in the cerebral white matter. 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 Generalized brain atrophy. No acute or subacute infarct, hemorrhage, hydrocephalus, or collection Vascular: Major flow voids and vascular enhancements are preserved Skull and upper cervical spine: Normal marrow signal. Sinuses/Orbits: Left cataract resection. Minor opacification of the mastoid air cells. IMPRESSION: 1. No evidence of metastatic disease. 2. 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 Electronically Signed   By: Tiburcio Pea M.D.   On: 04/18/2023 09:13     Assessment and plan-  The patient is a 77 y.o. male who presents to [facility] for initial meeting in preparation for starting immunotherapy for the treatment of  1. 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   Chemo Care Clinic/High Risk for ER/Hospitalization during treatment: We discussed the role of the chemo care clinic and identified patient specific risk factors. 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 [facility]    Old myocardial infarct    Other amnesia    TIA (transient ischemic attack)     Patient has past surgical history positive for: Past Surgical History:  Procedure Laterality Date   CATARACT  EXTRACTION Left 08/15/2020   CORONARY ARTERY BYPASS GRAFT N/A 09/24/2021   Procedure: CORONARY ARTERY BYPASS GRAFTING (CABG) TIMES THREE ,  ON PUMP, USING LEFT INTERNAL MAMMARY ARTERY AND ENDOSCOPICALLY HARVESTED RIGHT GREATER SAPHENOUS VEIN;  Surgeon: Corliss Skains, MD;  Location: [facility] OR;  Service: Open Heart Surgery;  Laterality: N/A;   ENDOVEIN HARVEST OF GREATER SAPHENOUS VEIN Right 09/24/2021   Procedure: ENDOVEIN HARVEST OF GREATER SAPHENOUS VEIN;  Surgeon: Corliss Skains, MD;  [facility]: [facility] OR;  Service: Open Heart Surgery;  Laterality: Right;   HEMORRHOID SURGERY     HERNIA REPAIR  2021   LEFT HEART CATH AND CORONARY ANGIOGRAPHY N/A 09/18/2021   Procedure: LEFT HEART CATH AND CORONARY ANGIOGRAPHY;  Surgeon: Lyn Records, MD;  [facility] INVASIVE CV LAB;  Service: Cardiovascular;  Laterality: N/A;   LOOP RECORDER INSERTION N/A 02/03/2019   Procedure: LOOP RECORDER INSERTION;  Surgeon: Regan Lemming, MD;  [facility]: [facility] INVASIVE CV LAB;  Service: Cardiovascular;  Laterality: N/A;   MOLE REMOVAL     PORTA CATH INSERTION     SKIN CANCER EXCISION     TEE WITHOUT CARDIOVERSION N/A 02/03/2019   Procedure: TRANSESOPHAGEAL ECHOCARDIOGRAM (TEE);  Surgeon: Jake Bathe, MD;  [facility] ENDOSCOPY;  Service: Cardiovascular;  Laterality: N/A;  loop   TEE WITHOUT CARDIOVERSION N/A 09/24/2021   Procedure: TRANSESOPHAGEAL ECHOCARDIOGRAM (TEE);  Surgeon: Corliss Skains, MD;  Location: [facility] OR;  Service: Open Heart Surgery;  Laterality: N/A;    Provided general information including the following:  1.  Date of education: 05/06/2023 2.  Physician name: Dr. Leatha Gilding 3.  Diagnosis: Malignant melanoma 4.  Stage: IIC 5.  Cure  6.  Treatment plan including drugs and how often: Pembrolizumab every 3 weeks 7.  Start date: 05/13/2023 8.  Other referrals: None at this time 9.  The patient is to call our office with any questions or concerns.  Our office number [phone],  if after hours or on the weekend, call the same number and wait for the answering service.  There is always an oncologist on call. 10.  Medications prescribed: ondansetron, prochlorperazine 11.  The patient has verbalized understanding of the treatment plan and has no barriers to adherence or understanding.   Obtained signed consent from patient.   Discussed symptoms including:  1.  Low blood counts including white blood cells red blood cells, and platelets.  If experience increased fatigue or abnormal bruising or bleeding, call our office. 2.  Infection including to avoid large crowds, wash hands frequently, and stay away from people who were sick.  If fever develops of 100.4 or higher, call our office. 3.  Mucositis:  Instructions on mouth rinse given (baking soda and salt mixture).  Keep mouth clean.  Use soft bristle toothbrush.  Avoid alcohol containing mouthwash.  If mouth sores develop, call our office. 4.  Nausea/vomiting:  Prescriptions given: ondansetron 8 mg every 8 hours as needed for nausea or vomiting and prochlorperazine 10 mg every 6 hours as needed for nausea or vomiting, may alternate these medications and take around the clock if persistent.  If nausea and vomiting is not controlled, call our office 5.  Diarrhea: Use over-the-counter Imodium.  Call our office if diarrhea is not controlled. 6.  Constipation: Use senna-S, 1 to 2 tablets twice a day.  Call our office if no BM in 2 to 3 days. 7.  Loss of appetite:  Try to eat small meals every 2-3 hours.  Call our office if not able to eat or drink. 8.  Taste changes:  Try zinc 50 mg daily.  If becomes severe  call clinic. 9.  Drink 2 to 3 quarts of water per day. Call our office if not able to drink enough for urine to be pale yellow. 10. Avoid alcoholic beverages. 11. Peripheral neuropathy: Call office if numbness or tingling in hands or feet worsens or is suddenly severe. 12.  Ringing in the ears or hearing loss.  Call our office  if this develops.     The patient was given written information printed from [employer] patient education on individual chemotherapy agents which includes: Name of medications Approved uses Dose and schedule Storage and handling Handling body fluids and waste Drug and food interactions Possible side effects and management Pregnancy, sexual activity, and contraception Obtaining medication   Gave information on the supportive care team and how to contact them regarding services.  Discussed advanced directives.  The patient does not have their advanced directives.   We discussed that social determinants of health may have significant impacts on health and outcomes for cancer patients.  Today we discussed specific social determinants of performance status, alcohol use, depression, financial needs, food insecurity, housing, interpersonal violence, social connections, stress, tobacco use, and transportation.    After lengthy discussion the following were identified as areas of need:   Outpatient services: We discussed options including home based and outpatient services, DME, nutrition counseling, and supportive care program. We discussed that patients who participate in regular physical activity report fewer negative impacts of cancer and treatments and report less fatigue.   Financial Concerns: We discussed that living with cancer can create tremendous financial burden.  We discussed options for assistance. I asked that if assistance is needed in affording medications or paying bills to please let us know so that we can provide assistance. We discussed options for food including social services.  Referral to Social work: Introduced Child psychotherapist Mady Haagensen and the services she can provide, such as support with utility bill, cell phone and gas vouchers.  Also introduced [place], LCSW, our licensed clinical social worker who provides counseling as needed.  Support groups: We discussed  options for support groups at [facility]. We discussed options for managing stress including healthy eating and exercise, as well as participating in no charge counseling services at the cancer center and support groups.  If these are of interest, patient can notify either myself or primary nursing team.We discussed options for management including medications.  Transportation: We discussed options for transportation.  The patient will contact our office if he requires assistance with transportation.  Palliative care services: We have palliative care services available in the cancer center to discuss goals of care and advanced care planning.  Please let us know if you have any questions or would like to speak to our palliative care practitioner.  Symptom Management Clinic: We discussed our symptom management clinic which is available for acute concerns while receiving treatment such as nausea, vomiting or diarrhea.  We can be reached via telephone at [phone].  We are available for virtual or in person visits on the same day from 9 to 4 PM Monday through Friday.   He denies needing specific assistance at this time.  He will be followed by Dr. Gerald Dexter clinical team.   Disposition: RTC on 05/28/2023  Visit Diagnosis 1. 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  They were provided an opportunity to ask questions and all were  answered. The patient expressed understanding and was in agreement with this plan. He also understands that he can call clinic at any time with any questions, concerns, or complaints.   I provided 30 minutes of face-to-face time during this encounter, and > 50% was spent counseling as documented under my assessment & plan.   Aloha Bartok A. Sol Passer, PA-C [facility] [phone]

## 2023-05-06 ENCOUNTER — Inpatient Hospital Stay: Payer: Medicare HMO | Attending: Oncology | Admitting: Oncology

## 2023-05-06 ENCOUNTER — Encounter: Payer: Self-pay | Admitting: Hematology and Oncology

## 2023-05-06 ENCOUNTER — Inpatient Hospital Stay: Payer: Medicare HMO

## 2023-05-06 ENCOUNTER — Inpatient Hospital Stay (INDEPENDENT_AMBULATORY_CARE_PROVIDER_SITE_OTHER): Payer: Medicare HMO | Admitting: Hematology and Oncology

## 2023-05-06 VITALS — BP 126/73 | HR 90 | Temp 97.7°F | Resp 14 | Ht 69.0 in | Wt 185.6 lb

## 2023-05-06 DIAGNOSIS — I2510 Atherosclerotic heart disease of native coronary artery without angina pectoris: Secondary | ICD-10-CM | POA: Diagnosis not present

## 2023-05-06 DIAGNOSIS — Z5111 Encounter for antineoplastic chemotherapy: Secondary | ICD-10-CM | POA: Insufficient documentation

## 2023-05-06 DIAGNOSIS — C4372 Malignant melanoma of left lower limb, including hip: Secondary | ICD-10-CM

## 2023-05-06 DIAGNOSIS — Z79899 Other long term (current) drug therapy: Secondary | ICD-10-CM | POA: Diagnosis not present

## 2023-05-06 DIAGNOSIS — E1136 Type 2 diabetes mellitus with diabetic cataract: Secondary | ICD-10-CM | POA: Insufficient documentation

## 2023-05-06 DIAGNOSIS — Z8673 Personal history of transient ischemic attack (TIA), and cerebral infarction without residual deficits: Secondary | ICD-10-CM | POA: Insufficient documentation

## 2023-05-06 DIAGNOSIS — N400 Enlarged prostate without lower urinary tract symptoms: Secondary | ICD-10-CM | POA: Insufficient documentation

## 2023-05-06 DIAGNOSIS — R54 Age-related physical debility: Secondary | ICD-10-CM

## 2023-05-06 DIAGNOSIS — R350 Frequency of micturition: Secondary | ICD-10-CM

## 2023-05-06 DIAGNOSIS — I10 Essential (primary) hypertension: Secondary | ICD-10-CM | POA: Insufficient documentation

## 2023-05-06 DIAGNOSIS — E782 Mixed hyperlipidemia: Secondary | ICD-10-CM | POA: Diagnosis not present

## 2023-05-06 DIAGNOSIS — Z7984 Long term (current) use of oral hypoglycemic drugs: Secondary | ICD-10-CM | POA: Insufficient documentation

## 2023-05-06 DIAGNOSIS — I252 Old myocardial infarction: Secondary | ICD-10-CM | POA: Diagnosis not present

## 2023-05-06 LAB — COMPREHENSIVE METABOLIC PANEL
ALT: 18 U/L (ref 0–44)
AST: 19 U/L (ref 15–41)
Albumin: 3.5 g/dL (ref 3.5–5.0)
Alkaline Phosphatase: 98 U/L (ref 38–126)
Anion gap: 7 (ref 5–15)
BUN: 12 mg/dL (ref 8–23)
CO2: 25 mmol/L (ref 22–32)
Calcium: 8.9 mg/dL (ref 8.9–10.3)
Chloride: 103 mmol/L (ref 98–111)
Creatinine, Ser: 0.55 mg/dL — ABNORMAL LOW (ref 0.61–1.24)
GFR, Estimated: >60 mL/min (ref >60)
Glucose, Bld: 200 mg/dL — ABNORMAL HIGH (ref 70–99)
Potassium: 4.4 mmol/L (ref 3.5–5.1)
Sodium: 135 mmol/L (ref 135–145)
Total Bilirubin: 0.7 mg/dL (ref 0.3–1.2)
Total Protein: 6.7 g/dL (ref 6.5–8.1)

## 2023-05-06 LAB — TSH: TSH: 1.876 u[IU]/mL (ref 0.350–4.500)

## 2023-05-06 LAB — CBC WITH DIFFERENTIAL/PLATELET
Abs Immature Granulocytes: 0.05 10*3/uL (ref 0.00–0.07)
Basophils Absolute: 0.1 10*3/uL (ref 0.0–0.1)
Basophils Relative: 1 %
Eosinophils Absolute: 0.3 10*3/uL (ref 0.0–0.5)
Eosinophils Relative: 3 %
HCT: 43.3 % (ref 39.0–52.0)
Hemoglobin: 14.2 g/dL (ref 13.0–17.0)
Immature Granulocytes: 1 %
Lymphocytes Relative: 37 %
Lymphs Abs: 3.3 10*3/uL (ref 0.7–4.0)
MCH: 29 pg (ref 26.0–34.0)
MCHC: 32.8 g/dL (ref 30.0–36.0)
MCV: 88.5 fL (ref 80.0–100.0)
Monocytes Absolute: 1 10*3/uL (ref 0.1–1.0)
Monocytes Relative: 11 %
Neutro Abs: 4.2 10*3/uL (ref 1.7–7.7)
Neutrophils Relative %: 47 %
Platelets: 353 10*3/uL (ref 150–400)
RBC: 4.89 MIL/uL (ref 4.22–5.81)
RDW: 13.3 % (ref 11.5–15.5)
WBC: 8.9 10*3/uL (ref 4.0–10.5)
nRBC: 0 % (ref 0.0–0.2)

## 2023-05-06 LAB — T4, FREE: Free T4: 0.82 ng/dL (ref 0.61–1.12)

## 2023-05-06 LAB — PSA: Prostatic Specific Antigen: 7.11 ng/mL — ABNORMAL HIGH (ref 0.00–4.00)

## 2023-05-06 MED ORDER — FINASTERIDE 5 MG PO TABS: 5.0000 mg | ORAL_TABLET | Freq: Every day | ORAL | 5 refills | Status: AC

## 2023-05-06 MED ORDER — PROCHLORPERAZINE MALEATE 10 MG PO TABS
10.0000 mg | ORAL_TABLET | Freq: Four times a day (QID) | ORAL | 1 refills | Status: AC | PRN
Start: 2023-05-06 — End: ?

## 2023-05-06 MED ORDER — ONDANSETRON HCL 8 MG PO TABS
8.0000 mg | ORAL_TABLET | Freq: Three times a day (TID) | ORAL | 1 refills | Status: AC | PRN
Start: 2023-05-06 — End: ?

## 2023-05-06 NOTE — Progress Notes (Signed)
[facility] Cancer Initial Visit:  Patient Care Team: Patient, No Pcp Per as PCP - General (General Practice) [name], MD as PCP - Cardiology (Cardiology) Jenene Slicker, Deberah Castle, MD as Consulting Physician (Optometry) Olegario Shearer, MD as Referring Physician (Dermatology) Loni Muse, MD as Consulting Physician (Internal Medicine)  CHIEF COMPLAINTS/PURPOSE OF CONSULTATION:  Oncology History  Malignant melanoma (HCC)  04/03/2023 Initial Diagnosis   Malignant melanoma (HCC)   04/03/2023 Cancer Staging   Staging form: Melanoma of the Skin, AJCC 8th Edition - Clinical stage from 04/03/2023: Stage IIC (cT4b, cN0, cM0) - Signed by Loni Muse, MD on 04/03/2023 Histopathologic type: Nodular melanoma Stage prefix: Initial diagnosis   04/22/2023 -  Chemotherapy   Patient is on Treatment Plan : MELANOMA Pembrolizumab (200) q21d       HISTORY OF PRESENTING ILLNESS: Jon Nelson 77 y.o. 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  Breslow depth 5.1 mm.  Clark's level 4.  Ulceration present.  Mitotic index 16/mm.  Pathologic stage T4b.  Deep margin involved  Apr 03 2023:  [facility] Medical Oncology Consult Patient here with sister in law [name] who is also his power of attorney.  He has had a mole on the left lateral leg for years but in the last year it degenerated into a sore which didn't heal which prompted him to see a dermatologist.  He has been followed by dermatology for removal of non-melanomatous skin cancers on his face and arms over the years.  He underwent a shave biopsy of the left leg lesion on March 19 2023.  He has not been noted to have in transit lesions.  April 01 2023 he underwent a wide local excision of the region but did not undergo  inguinal lymphadenectomy.     Social:  Widowed.  Lives with his dog.  Worked for [employer] where he worked outside.  No smoking.  EtOH none  [facility] Mother died 96 dementia Father estranged No siblings.     Apr 12 2023:  MRI brain No evidence of metastatic disease. 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  Apr 14 2023:  Whole body PET/CT Postsurgical changes in the lateral left lower leg, as above.  No findings suspicious for metastatic disease.   Apr 29 2023:   Has undergone port placement since last visit.  Has not yet been scheduled for teaching or therapy.  Discussed risks and benefits of immunotherapy    May 06 2023:  Scheduled follow up for melanoma.  Experiencing constipation.  Has urinary frequency but not interested in seeing a urologist.  To undergo chemotherapy teaching today.  Will check PSA and place on   May 13 2023:  Cycle 1 Pembrolizumab  Review of Systems  Constitutional:  Negative for appetite change, chills, fatigue, fever and unexpected weight change.  HENT:   Positive for hearing loss. Negative for lump/mass, mouth sores, nosebleeds, sore throat and trouble swallowing.        Deaf left ear  Eyes:  Positive for eye problems. Negative for icterus.       Vision changes:  None  Respiratory:  Negative for chest tightness, cough, hemoptysis and wheezing.        DOE on walking up hill  Cardiovascular:  Positive for leg  swelling. Negative for chest pain and palpitations.       PND:  none Orthopnea:  none  Gastrointestinal:  Negative for abdominal distention, abdominal pain, blood in stool, constipation, diarrhea, nausea and vomiting.  Endocrine: Negative for hot flashes.       Cold intolerance:  none Heat intolerance:  none  Genitourinary:  Positive for frequency. Negative for bladder incontinence, difficulty urinating, dysuria and hematuria.        Nocturia x 3 to 4  Musculoskeletal:  Negative for arthralgias, back pain, gait  problem, myalgias, neck pain and neck stiffness.  Skin:  Negative for rash.       Chronic pruritus of arms.  Healing from surgery to left leg  Neurological:  Negative for extremity weakness, gait problem, headaches, numbness and speech difficulty.       Occasional dizziness particularly when bending over.  No falls   Hematological:  Negative for adenopathy. Does not bruise/bleed easily.  Psychiatric/Behavioral:  Negative for sleep disturbance and suicidal ideas. The patient is not nervous/anxious.     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)    Old myocardial infarct    Other amnesia    TIA (transient ischemic attack)     SURGICAL HISTORY: Past Surgical History:  Procedure Laterality Date   CATARACT EXTRACTION Left 08/15/2020   CORONARY ARTERY BYPASS GRAFT N/A 09/24/2021   Procedure: CORONARY ARTERY BYPASS GRAFTING (CABG) TIMES THREE , ON PUMP, USING LEFT INTERNAL MAMMARY ARTERY AND ENDOSCOPICALLY HARVESTED RIGHT GREATER SAPHENOUS VEIN;  Surgeon: Corliss Skains, MD;  Location: [facility] OR;  Service: Open Heart Surgery;  Laterality: N/A;   ENDOVEIN HARVEST OF GREATER SAPHENOUS VEIN Right 09/24/2021   Procedure: ENDOVEIN HARVEST OF GREATER SAPHENOUS VEIN;  Surgeon: Corliss Skains, MD;  Location: [facility] OR;  Service: Open Heart Surgery;  Laterality: Right;   HEMORRHOID SURGERY     HERNIA REPAIR  2021   LEFT HEART CATH AND CORONARY ANGIOGRAPHY N/A 09/18/2021   Procedure: LEFT HEART CATH AND CORONARY ANGIOGRAPHY;  Surgeon: Lyn Records, MD;  Location: [facility] INVASIVE CV LAB;  Service: Cardiovascular;  Laterality: N/A;   LOOP RECORDER INSERTION N/A 02/03/2019    Procedure: LOOP RECORDER INSERTION;  Surgeon: Regan Lemming, MD;  Location: [facility] INVASIVE CV LAB;  Service: Cardiovascular;  Laterality: N/A;   MOLE REMOVAL     PORTA CATH INSERTION     SKIN CANCER EXCISION     TEE WITHOUT CARDIOVERSION N/A 02/03/2019   Procedure: TRANSESOPHAGEAL ECHOCARDIOGRAM (TEE);  Surgeon: Jake Bathe, MD;  Location: [facility] ENDOSCOPY;  Service: Cardiovascular;  Laterality: N/A;  loop   TEE WITHOUT CARDIOVERSION N/A 09/24/2021   Procedure: TRANSESOPHAGEAL ECHOCARDIOGRAM (TEE);  Surgeon: Corliss Skains, MD;  Location: [facility] OR;  Service: Open Heart Surgery;  Laterality: N/A;    SOCIAL HISTORY: Social History   Socioeconomic History   Marital status: Widowed    Spouse name: Not on file   Number of children: 0   Years of education: Not on file   Highest education level: Not on file  Occupational History   Occupation: Retired Brewing technologist)  Tobacco Use   Smoking status: Never  Smokeless tobacco: Never  Vaping Use   Vaping Use: Never used  Substance and Sexual Activity   Alcohol use: Never   Drug use: Never   Sexual activity: Not Currently  Other Topics Concern   Not on file  Social History Narrative   Patient's wife passed away in 06-06-15 due to cancer - she was unable to have children.  Patient has a sister, his niece and her family lives in his home temporarily    Social Determinants of Health   Financial Resource Strain: Low Risk  (01/16/2022)   Overall Financial Resource Strain (CARDIA)    Difficulty of Paying Living Expenses: Not hard at all  Food Insecurity: No Food Insecurity (04/03/2023)   Hunger Vital Sign    Worried About Running Out of Food in the Last Year: Never true    Ran Out of Food in the Last Year: Never true  Transportation Needs: No Transportation Needs (01/16/2022)   PRAPARE - Administrator, Civil Service (Medical): No    Lack of Transportation (Non-Medical): No  Physical Activity: Not on file  Stress: Not on file   Social Connections: Not on file  Intimate Partner Violence: Not At Risk (04/03/2023)   Humiliation, Afraid, Rape, and Kick questionnaire    Fear of Current or Ex-Partner: No    Emotionally Abused: No    Physically Abused: No    Sexually Abused: No    FAMILY HISTORY Family History  Problem Relation Age of Onset   Stroke Mother    Alzheimer's disease Mother     ALLERGIES:  is allergic to aricept [donepezil].  MEDICATIONS:  Current Outpatient Medications  Medication Sig Dispense Refill   acetaminophen (TYLENOL) 500 MG tablet Take 500 mg by mouth every 6 (six) hours as needed for mild pain or moderate pain.     aspirin EC 81 MG EC tablet Take 1 tablet (81 mg total) by mouth daily. Swallow whole.     clopidogrel (PLAVIX) 75 MG tablet Take 1 tablet (75 mg total) by mouth daily. 30 tablet 1   meloxicam (MOBIC) 15 MG tablet Take 15 mg by mouth daily.     metFORMIN (GLUCOPHAGE) 500 MG tablet Take 1 tablet (500 mg total) by mouth every morning. 90 tablet 2   metoprolol tartrate (LOPRESSOR) 25 MG tablet Take 0.5 tablets (12.5 mg total) by mouth 2 (two) times daily. 30 tablet 1   rosuvastatin (CRESTOR) 40 MG tablet Take 1 tablet (40 mg total) by mouth daily. 90 tablet 1   traMADol (ULTRAM) 50 MG tablet Take 50 mg by mouth every 6 (six) hours as needed.     No current facility-administered medications for this visit.    PHYSICAL EXAMINATION:  ECOG PERFORMANCE STATUS: 1 - Symptomatic but completely ambulatory   Vitals:   05/06/23 0952  BP: 126/73  Pulse: 90  Resp: 14  Temp: 97.7 F (36.5 C)  SpO2: 95%     Filed Weights   05/06/23 0952  Weight: 185 lb 9.6 oz (84.2 kg)      Physical Exam Vitals and nursing note reviewed.  Constitutional:      Appearance: Normal appearance. He is not diaphoretic.     Comments: Here alone  HENT:     Head: Normocephalic and atraumatic.     Right Ear: External ear normal.     Left Ear: External ear normal.     Nose: Nose normal.  Eyes:      Conjunctiva/sclera: Conjunctivae normal.  Pupils: Pupils are equal, round, and reactive to light.  Cardiovascular:     Rate and Rhythm: Normal rate and regular rhythm.     Heart sounds:     No friction rub. No gallop.  Pulmonary:     Effort: Pulmonary effort is normal. No respiratory distress.     Breath sounds: Normal breath sounds. No stridor.  Abdominal:     General: Abdomen is flat. There is no distension.     Palpations: Abdomen is soft.     Tenderness: There is no abdominal tenderness. There is no guarding.  Musculoskeletal:        General: No swelling. Normal range of motion.     Cervical back: Normal range of motion and neck supple. No rigidity or tenderness.     Right lower leg: Edema present.     Left lower leg: Edema present.  Lymphadenopathy:     Head:     Right side of head: No submental, submandibular, tonsillar, preauricular, posterior auricular or occipital adenopathy.     Left side of head: No submental, submandibular, tonsillar, preauricular, posterior auricular or occipital adenopathy.     Cervical: No cervical adenopathy.     Right cervical: No superficial, deep or posterior cervical adenopathy.    Left cervical: No superficial, deep or posterior cervical adenopathy.     Upper Body:     Right upper body: No supraclavicular or axillary adenopathy.     Left upper body: No supraclavicular or axillary adenopathy.     Lower Body: No right inguinal adenopathy. No left inguinal adenopathy.  Skin:    Coloration: Skin is not jaundiced.     Findings: No bruising or erythema.     Comments: Fitzpatrick 2 skin tone.  Significant solar damage to face and arms.   Left leg bandaged.    Neurological:     General: No focal deficit present.     Mental Status: He is alert and oriented to person, place, and time.     Comments: Hard of hearing  Psychiatric:        Mood and Affect: Mood normal.        Behavior: Behavior normal.        Thought Content: Thought content  normal.        Judgment: Judgment normal.     LABORATORY DATA: I have personally reviewed the data as listed:  Office Visit on 04/29/2023  Component Date Value Ref Range Status   WBC 04/29/2023 9.6  4.0 - 10.5 K/uL Final   RBC 04/29/2023 4.75  4.22 - 5.81 MIL/uL Final   Hemoglobin 04/29/2023 13.9  13.0 - 17.0 g/dL Final   HCT 54/08/8118 41.8  39.0 - 52.0 % Final   MCV 04/29/2023 88.0  80.0 - 100.0 fL Final   MCH 04/29/2023 29.3  26.0 - 34.0 pg Final   MCHC 04/29/2023 33.3  30.0 - 36.0 g/dL Final   RDW 14/78/2956 13.1  11.5 - 15.5 % Final   Platelets 04/29/2023 360  150 - 400 K/uL Final   nRBC 04/29/2023 0.0  0.0 - 0.2 % Final   Neutrophils Relative % 04/29/2023 50  % Final   Neutro Abs 04/29/2023 4.8  1.7 - 7.7 K/uL Final   Lymphocytes Relative 04/29/2023 34  % Final   Lymphs Abs 04/29/2023 3.3  0.7 - 4.0 K/uL Final   Monocytes Relative 04/29/2023 11  % Final   Monocytes Absolute 04/29/2023 1.1 (H)  0.1 - 1.0 K/uL Final   Eosinophils Relative  04/29/2023 3  % Final   Eosinophils Absolute 04/29/2023 0.3  0.0 - 0.5 K/uL Final   Basophils Relative 04/29/2023 0  % Final   Basophils Absolute 04/29/2023 0.0  0.0 - 0.1 K/uL Final   Immature Granulocytes 04/29/2023 2  % Final   Abs Immature Granulocytes 04/29/2023 0.15 (H)  0.00 - 0.07 K/uL Final   Performed at [facility], [address] [place], [place] [address]   Sodium 04/29/2023 135  135 - 145 mmol/L Final   Potassium 04/29/2023 3.9  3.5 - 5.1 mmol/L Final   Chloride 04/29/2023 103  98 - 111 mmol/L Final   CO2 04/29/2023 24  22 - 32 mmol/L Final   Glucose, Bld 04/29/2023 137 (H)  70 - 99 mg/dL Final   Glucose reference range applies only to samples taken after fasting for at least 8 hours.   BUN 04/29/2023 12  8 - 23 mg/dL Final   Creatinine, Ser 04/29/2023 0.57 (L)  0.61 - 1.24 mg/dL Final   Calcium 60/45/4098 8.6 (L)  8.9 - 10.3 mg/dL Final   Total Protein 11/91/4782 6.5  6.5 - 8.1 g/dL Final   Albumin  95/62/1308 3.3 (L)  3.5 - 5.0 g/dL Final   AST 65/78/4696 18  15 - 41 U/L Final   ALT 04/29/2023 17  0 - 44 U/L Final   Alkaline Phosphatase 04/29/2023 93  38 - 126 U/L Final   Total Bilirubin 04/29/2023 0.5  0.3 - 1.2 mg/dL Final   GFR, Estimated 04/29/2023 >60  >60 mL/min Final   Comment: (NOTE) Calculated using the CKD-EPI Creatinine Equation (2021)    Anion gap 04/29/2023 8  5 - 15 Final   Performed at [facility], [address] [place], [place] [address]   Cortisol, Plasma 04/29/2023 8.4  ug/dL Final   Comment: (NOTE) AM    6.7 - 22.6 ug/dL PM   <41.3       ug/dL Performed at [facility], [address] [place], [place] [address]    TSH 04/29/2023 2.636  0.350 - 4.500 uIU/mL Final   Comment: Performed by a 3rd Generation assay with a functional sensitivity of <=0.01 uIU/mL. Performed at [facility], [address], [place], [place] [address]    Free T4 04/29/2023 0.76  0.61 - 1.12 ng/dL Final   Comment: (NOTE) Biotin ingestion may interfere with free T4 tests. If the results are inconsistent with the TSH level, previous test results, or the clinical presentation, then consider biotin interference. If needed, order repeat testing after stopping biotin. Performed at [facility], [address], [place], [place] [address]     RADIOGRAPHIC STUDIES: I have personally reviewed the radiological images as listed and agree with the findings in the report  No results found.  ASSESSMENT/PLAN 77 y.o. male is here because of  malignant melanoma.  Medical history notable for diabetes mellitus, coronary artery disease, cerebrovascular disease, MI, cataracts  Malignant melanoma, distal left leg, Stage IIC (T4b N0 M0)- Risk factors for this patient are 1) chronic exposure to UV 2) Sunburns 3) Fitzpatrick Grade 2 skin tone March 19, 2023: Shave biopsy left lateral leg demonstrated malignant melanoma, nodular type. Breslow depth 5.1  mm. Clark's level 4. Ulceration present. Mitotic index 16/mm. Pathologic stage T4b. Deep margin involved  April 01 2023- Wide local excision.  Sentinel LN bx not performed due to patient frailty  Therapeutics   Apr 03 2023- Will obtain CBC with  diff, CMP, LDH, MRI brain and whole body PET/CT for staging  Patient meets criteria per NCCN Guidelines for adjuvant immunotherapy and will plan on adjuvant Pembrolizumab  May 06 2023- Chemotherapy teaching  Immunotherapy Risks:   May 06 2023- Reiterated potential side effects of immunotherapy with the patient.  These include but are not limited to:  Fatigue, Hair loss, low blood counts (anemia, thrombocytopenia) that may necessitate transfusion, bleeding, infection, nausea/ vomiting/Appetite changes/ Constipation/ Diarrhea, mucositis, Neuropathy/ neurologic problems, skin and nail changes such as dry skin and color change, Urine and bladder changes and kidney problems, weight changes, mood changes, decreased libido/fertility problems, damage to heart and lungs.  Some of these side effects can be life threatening, may be permanent and can result in hospitalization and/or death.  In shared decision making patient has agreed to proceed with immunotherapy.    Poor IV access  Apr 29 2023- Has undergone portacath placement  Urinary frequency  May 06 2023- Likely secondary to BPH.  Will check U/A with microscopic, PSA and begin Proscar.       Cancer Staging  Malignant melanoma (HCC) Staging form: Melanoma of the Skin, AJCC 8th Edition - Clinical stage from 04/03/2023: Stage IIC (cT4b, cN0, cM0) - Signed by Loni Muse, MD on 04/03/2023 Histopathologic type: Nodular melanoma Stage prefix: Initial diagnosis    No problem-specific Assessment & Plan notes found for this encounter.    No orders of the defined types were placed in this encounter.   30 minutes was spent in patient care.  This included time spent preparing to see the patient (e.g.,  review of tests), obtaining and/or reviewing separately obtained history, counseling and educating the patient/family/caregiver, ordering medications, tests, or procedures; documenting clinical information in the electronic or other health record, independently interpreting results and communicating results to the patient/family/caregiver as well as coordination of care.       All questions were answered. The patient knows to call the clinic with any problems, questions or concerns.  This note was electronically signed.    Loni Muse, MD  05/06/2023 10:11 AM

## 2023-05-13 ENCOUNTER — Ambulatory Visit: Payer: Medicare HMO | Attending: Cardiology

## 2023-05-13 ENCOUNTER — Inpatient Hospital Stay: Payer: Medicare HMO

## 2023-05-13 VITALS — BP 113/72 | HR 87 | Resp 18 | Ht 69.0 in | Wt 187.0 lb

## 2023-05-13 DIAGNOSIS — R350 Frequency of micturition: Secondary | ICD-10-CM | POA: Diagnosis not present

## 2023-05-13 DIAGNOSIS — N400 Enlarged prostate without lower urinary tract symptoms: Secondary | ICD-10-CM | POA: Diagnosis not present

## 2023-05-13 DIAGNOSIS — C4372 Malignant melanoma of left lower limb, including hip: Secondary | ICD-10-CM | POA: Diagnosis not present

## 2023-05-13 DIAGNOSIS — Z7984 Long term (current) use of oral hypoglycemic drugs: Secondary | ICD-10-CM | POA: Diagnosis not present

## 2023-05-13 DIAGNOSIS — I6523 Occlusion and stenosis of bilateral carotid arteries: Secondary | ICD-10-CM

## 2023-05-13 DIAGNOSIS — Z01810 Encounter for preprocedural cardiovascular examination: Secondary | ICD-10-CM

## 2023-05-13 DIAGNOSIS — Z951 Presence of aortocoronary bypass graft: Secondary | ICD-10-CM

## 2023-05-13 DIAGNOSIS — I10 Essential (primary) hypertension: Secondary | ICD-10-CM | POA: Diagnosis not present

## 2023-05-13 DIAGNOSIS — E1159 Type 2 diabetes mellitus with other circulatory complications: Secondary | ICD-10-CM

## 2023-05-13 DIAGNOSIS — E782 Mixed hyperlipidemia: Secondary | ICD-10-CM | POA: Diagnosis not present

## 2023-05-13 DIAGNOSIS — Z8673 Personal history of transient ischemic attack (TIA), and cerebral infarction without residual deficits: Secondary | ICD-10-CM | POA: Diagnosis not present

## 2023-05-13 DIAGNOSIS — I2510 Atherosclerotic heart disease of native coronary artery without angina pectoris: Secondary | ICD-10-CM | POA: Diagnosis not present

## 2023-05-13 DIAGNOSIS — E1136 Type 2 diabetes mellitus with diabetic cataract: Secondary | ICD-10-CM | POA: Diagnosis not present

## 2023-05-13 DIAGNOSIS — Z79899 Other long term (current) drug therapy: Secondary | ICD-10-CM | POA: Diagnosis not present

## 2023-05-13 DIAGNOSIS — Z5111 Encounter for antineoplastic chemotherapy: Secondary | ICD-10-CM | POA: Diagnosis not present

## 2023-05-13 DIAGNOSIS — I252 Old myocardial infarction: Secondary | ICD-10-CM | POA: Diagnosis not present

## 2023-05-13 MED ORDER — HEPARIN SOD (PORK) LOCK FLUSH 100 UNIT/ML IV SOLN
500.0000 [IU] | Freq: Once | INTRAVENOUS | Status: AC | PRN
  Administered 2023-05-13: 500 [IU]

## 2023-05-13 MED ORDER — SODIUM CHLORIDE 0.9 % IV SOLN: Freq: Once | INTRAVENOUS | Status: AC

## 2023-05-13 MED ORDER — SODIUM CHLORIDE 0.9% FLUSH
10.0000 mL | INTRAVENOUS | Status: DC | PRN
  Administered 2023-05-13: 10 mL

## 2023-05-13 MED ORDER — SODIUM CHLORIDE 0.9 % IV SOLN
200.0000 mg | Freq: Once | INTRAVENOUS | Status: AC
  Administered 2023-05-13: 200 mg via INTRAVENOUS
  Filled 2023-05-13: qty 8

## 2023-05-13 NOTE — Patient Instructions (Signed)

## 2023-05-14 ENCOUNTER — Telehealth: Payer: Self-pay

## 2023-05-14 NOTE — Telephone Encounter (Addendum)
I spoke with Jon Nelson. He states, "I'm doing ok. Y'all did a great job". He denies N/V, skin rashes, itching, SOB, fever, & chills. I reminded him if he develops temp of 100.4 or higher to call us. He verbalized understanding.

## 2023-05-15 ENCOUNTER — Telehealth: Payer: Self-pay

## 2023-05-15 ENCOUNTER — Encounter: Payer: Self-pay | Admitting: Oncology

## 2023-05-15 NOTE — Telephone Encounter (Signed)
Patient notified of results.

## 2023-05-15 NOTE — Telephone Encounter (Signed)
-----   Message from [name], MD sent at 05/14/2023  2:58 PM EDT ----- Cardiac ultrasound show up to 39% stenosis bilaterally which is only mild disease, medical therapy

## 2023-05-16 DIAGNOSIS — E538 Deficiency of other specified B group vitamins: Secondary | ICD-10-CM | POA: Diagnosis not present

## 2023-05-16 DIAGNOSIS — Z7984 Long term (current) use of oral hypoglycemic drugs: Secondary | ICD-10-CM | POA: Diagnosis not present

## 2023-05-16 DIAGNOSIS — E119 Type 2 diabetes mellitus without complications: Secondary | ICD-10-CM | POA: Diagnosis not present

## 2023-05-27 ENCOUNTER — Encounter: Payer: Self-pay | Admitting: Surgery

## 2023-05-27 NOTE — Progress Notes (Deleted)
[facility] Cancer Initial Visit:  Patient Care Team: Patient, No Pcp Per as PCP - General (General Practice) [name], MD as PCP - Cardiology (Cardiology) Jenene Slicker, Deberah Castle, MD as Consulting Physician (Optometry) Olegario Shearer, MD as Referring Physician (Dermatology) Loni Muse, MD as Consulting Physician (Internal Medicine)  CHIEF COMPLAINTS/PURPOSE OF CONSULTATION:  Oncology History  Malignant melanoma (HCC)  04/03/2023 Initial Diagnosis   Malignant melanoma (HCC)   04/03/2023 Cancer Staging   Staging form: Melanoma of the Skin, AJCC 8th Edition - Clinical stage from 04/03/2023: Stage IIC (cT4b, cN0, cM0) - Signed by Loni Muse, MD on 04/03/2023 Histopathologic type: Nodular melanoma Stage prefix: Initial diagnosis   05/13/2023 -  Chemotherapy   Patient is on Treatment Plan : MELANOMA Pembrolizumab (200) q21d       HISTORY OF PRESENTING ILLNESS: Jon Nelson 77 y.o. 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  Breslow depth 5.1 mm.  Clark's level 4.  Ulceration present.  Mitotic index 16/mm.  Pathologic stage T4b.  Deep margin involved  Apr 03 2023:  [facility] Medical Oncology Consult Patient here with sister in law Ms Jon Nelson who is also his power of attorney.  He has had a mole on the left lateral leg for years but in the last year it degenerated into a sore which didn't heal which prompted him to see a dermatologist.  He has been followed by dermatology for removal of non-melanomatous skin cancers on his face and arms over the years.  He underwent a shave biopsy of the left leg lesion on March 19 2023.  He has not been noted to have in transit lesions.  April 01 2023 he underwent a wide local excision of the region but did not undergo  inguinal lymphadenectomy.     Social:  Widowed.  Lives with his dog.  Worked for [employer] where he worked outside.  No smoking.  EtOH none  [facility] Mother died 24 dementia Father estranged No siblings.     Apr 12 2023:  MRI brain No evidence of metastatic disease. 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  Apr 14 2023:  Whole body PET/CT Postsurgical changes in the lateral left lower leg, as above.  No findings suspicious for metastatic disease.   Apr 29 2023:   Has undergone port placement since last visit.  Has not yet been scheduled for teaching or therapy.  Discussed risks and benefits of immunotherapy    May 06 2023:  Scheduled follow up for melanoma.  Experiencing constipation.  Has urinary frequency but not interested in seeing a urologist.  To undergo chemotherapy teaching today.  Will check PSA and place on   May 13 2023:  Cycle 1 Pembrolizumab  Review of Systems  Constitutional:  Negative for appetite change, chills, fatigue, fever and unexpected weight change.  HENT:   Positive for hearing loss. Negative for lump/mass, mouth sores, nosebleeds, sore throat and trouble swallowing.        Deaf left ear  Eyes:  Positive for eye problems. Negative for icterus.       Vision changes:  None  Respiratory:  Negative for chest tightness, cough, hemoptysis and wheezing.        DOE on walking up hill  Cardiovascular:  Positive for leg  swelling. Negative for chest pain and palpitations.       PND:  none Orthopnea:  none  Gastrointestinal:  Negative for abdominal distention, abdominal pain, blood in stool, constipation, diarrhea, nausea and vomiting.  Endocrine: Negative for hot flashes.       Cold intolerance:  none Heat intolerance:  none  Genitourinary:  Positive for frequency. Negative for bladder incontinence, difficulty urinating, dysuria and hematuria.        Nocturia x 3 to 4  Musculoskeletal:  Negative for arthralgias, back pain, gait  problem, myalgias, neck pain and neck stiffness.  Skin:  Negative for rash.       Chronic pruritus of arms.  Healing from surgery to left leg  Neurological:  Negative for extremity weakness, gait problem, headaches, numbness and speech difficulty.       Occasional dizziness particularly when bending over.  No falls   Hematological:  Negative for adenopathy. Does not bruise/bleed easily.  Psychiatric/Behavioral:  Negative for sleep disturbance and suicidal ideas. The patient is not nervous/anxious.     MEDICAL HISTORY: Past Medical History:  Diagnosis Date  . Carotid artery stenosis   . Coronary artery stenosis   . Diabetes (HCC)   . Diabetes mellitus with cardiac complication (HCC)   . GI bleed   . HLD (hyperlipidemia)   . HTN (hypertension)   . Mixed hyperlipidemia   . 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 Old myocardial infarct   . Other amnesia   . TIA (transient ischemic attack)     SURGICAL HISTORY: Past Surgical History:  Procedure Laterality Date  . CATARACT EXTRACTION Left 08/15/2020  . CORONARY ARTERY BYPASS GRAFT N/A 09/24/2021   Procedure: CORONARY ARTERY BYPASS GRAFTING (CABG) TIMES THREE , ON PUMP, USING LEFT INTERNAL MAMMARY ARTERY AND ENDOSCOPICALLY HARVESTED RIGHT GREATER SAPHENOUS VEIN;  Surgeon: Corliss Skains, MD;  Location: [facility] OR;  Service: Open Heart Surgery;  Laterality: N/A;  . ENDOVEIN HARVEST OF GREATER SAPHENOUS VEIN Right 09/24/2021   Procedure: ENDOVEIN HARVEST OF GREATER SAPHENOUS VEIN;  Surgeon: Corliss Skains, MD;  Location: [facility] OR;  Service: Open Heart Surgery;  Laterality: Right;  . HEMORRHOID SURGERY    . HERNIA REPAIR  2021  . LEFT HEART CATH AND CORONARY ANGIOGRAPHY N/A 09/18/2021   Procedure: LEFT HEART CATH AND CORONARY ANGIOGRAPHY;  Surgeon: Lyn Records, MD;  Location: [facility] INVASIVE CV LAB;  Service: Cardiovascular;  Laterality: N/A;  . LOOP RECORDER INSERTION N/A  02/03/2019   Procedure: LOOP RECORDER INSERTION;  Surgeon: Regan Lemming, MD;  Location: [facility] INVASIVE CV LAB;  Service: Cardiovascular;  Laterality: N/A;  . MOLE REMOVAL    . PORTA CATH INSERTION    . SKIN CANCER EXCISION    . TEE WITHOUT CARDIOVERSION N/A 02/03/2019   Procedure: TRANSESOPHAGEAL ECHOCARDIOGRAM (TEE);  Surgeon: Jake Bathe, MD;  Location[facility] ENDOSCOPY;  Service: Cardiovascular;  Laterality: N/A;  loop  . TEE WITHOUT CARDIOVERSION N/A 09/24/2021   Procedure: TRANSESOPHAGEAL ECHOCARDIOGRAM (TEE);  Surgeon: Corliss Skains, MD;  Location: [facility] OR;  Service: Open Heart Surgery;  Laterality: N/A;    SOCIAL HISTORY: Social History   Socioeconomic History  . Marital status: Widowed    Spouse name: Not on file  . Number of children: 0  . Years of education: Not on file  . Highest education level: Not on file  Occupational History  . Occupation: Retired Brewing technologist)  Tobacco Use  . Smoking status: Never  .  Smokeless tobacco: Never  Vaping Use  . Vaping Use: Never used  Substance and Sexual Activity  . Alcohol use: Never  . Drug use: Never  . Sexual activity: Not Currently  Other Topics Concern  . Not on file  Social History Narrative   Patient's wife passed away in June 23, 2015 due to cancer - she was unable to have children.  Patient has a sister, his niece and her family lives in his home temporarily    Social Determinants of Health   Financial Resource Strain: Low Risk  (01/16/2022)   Overall Financial Resource Strain (CARDIA)   . Difficulty of Paying Living Expenses: Not hard at all  Food Insecurity: No Food Insecurity (04/03/2023)   Hunger Vital Sign   . Worried About Programme researcher, broadcasting/film/video in the Last Year: Never true   . Ran Out of Food in the Last Year: Never true  Transportation Needs: No Transportation Needs (01/16/2022)   PRAPARE - Transportation   . Lack of Transportation (Medical): No   . Lack of Transportation (Non-Medical): No  Physical Activity: Not  on file  Stress: Not on file  Social Connections: Not on file  Intimate Partner Violence: Not At Risk (04/03/2023)   Humiliation, Afraid, Rape, and Kick questionnaire   . Fear of Current or Ex-Partner: No   . Emotionally Abused: No   . Physically Abused: No   . Sexually Abused: No    FAMILY HISTORY Family History  Problem Relation Age of Onset  . Stroke Mother   . Alzheimer's disease Mother     ALLERGIES:  is allergic to aricept [donepezil].  MEDICATIONS:  Current Outpatient Medications  Medication Sig Dispense Refill  . acetaminophen (TYLENOL) 500 MG tablet Take 500 mg by mouth every 6 (six) hours as needed for mild pain or moderate pain.    Marland Kitchen aspirin EC 81 MG EC tablet Take 1 tablet (81 mg total) by mouth daily. Swallow whole.    . clopidogrel (PLAVIX) 75 MG tablet Take 1 tablet (75 mg total) by mouth daily. 30 tablet 1  . finasteride (PROSCAR) 5 MG tablet Take 1 tablet (5 mg total) by mouth daily. 30 tablet 5  . meloxicam (MOBIC) 15 MG tablet Take 15 mg by mouth daily.    . metFORMIN (GLUCOPHAGE) 500 MG tablet Take 1 tablet (500 mg total) by mouth every morning. 90 tablet 2  . metoprolol tartrate (LOPRESSOR) 25 MG tablet Take 0.5 tablets (12.5 mg total) by mouth 2 (two) times daily. 30 tablet 1  . ondansetron (ZOFRAN) 8 MG tablet Take 1 tablet (8 mg total) by mouth every 8 (eight) hours as needed for nausea or vomiting. 30 tablet 1  . prochlorperazine (COMPAZINE) 10 MG tablet Take 1 tablet (10 mg total) by mouth every 6 (six) hours as needed for nausea or vomiting. 30 tablet 1  . rosuvastatin (CRESTOR) 40 MG tablet Take 1 tablet (40 mg total) by mouth daily. 90 tablet 1  . traMADol (ULTRAM) 50 MG tablet Take 50 mg by mouth every 6 (six) hours as needed.     No current facility-administered medications for this visit.    PHYSICAL EXAMINATION:  ECOG PERFORMANCE STATUS: 1 - Symptomatic but completely ambulatory   There were no vitals filed for this visit.    There were no  vitals filed for this visit.     Physical Exam Vitals and nursing note reviewed.  Constitutional:      Appearance: Normal appearance. He is not diaphoretic.  Comments: Here alone  HENT:     Head: Normocephalic and atraumatic.     Right Ear: External ear normal.     Left Ear: External ear normal.     Nose: Nose normal.  Eyes:     Conjunctiva/sclera: Conjunctivae normal.     Pupils: Pupils are equal, round, and reactive to light.  Cardiovascular:     Rate and Rhythm: Normal rate and regular rhythm.     Heart sounds:     No friction rub. No gallop.  Pulmonary:     Effort: Pulmonary effort is normal. No respiratory distress.     Breath sounds: Normal breath sounds. No stridor.  Abdominal:     General: Abdomen is flat. There is no distension.     Palpations: Abdomen is soft.     Tenderness: There is no abdominal tenderness. There is no guarding.  Musculoskeletal:        General: No swelling. Normal range of motion.     Cervical back: Normal range of motion and neck supple. No rigidity or tenderness.     Right lower leg: Edema present.     Left lower leg: Edema present.  Lymphadenopathy:     Head:     Right side of head: No submental, submandibular, tonsillar, preauricular, posterior auricular or occipital adenopathy.     Left side of head: No submental, submandibular, tonsillar, preauricular, posterior auricular or occipital adenopathy.     Cervical: No cervical adenopathy.     Right cervical: No superficial, deep or posterior cervical adenopathy.    Left cervical: No superficial, deep or posterior cervical adenopathy.     Upper Body:     Right upper body: No supraclavicular or axillary adenopathy.     Left upper body: No supraclavicular or axillary adenopathy.     Lower Body: No right inguinal adenopathy. No left inguinal adenopathy.  Skin:    Coloration: Skin is not jaundiced.     Findings: No bruising or erythema.     Comments: Fitzpatrick 2 skin tone.  Significant  solar damage to face and arms.   Left leg bandaged.    Neurological:     General: No focal deficit present.     Mental Status: He is alert and oriented to person, place, and time.     Comments: Hard of hearing  Psychiatric:        Mood and Affect: Mood normal.        Behavior: Behavior normal.        Thought Content: Thought content normal.        Judgment: Judgment normal.    LABORATORY DATA: I have personally reviewed the data as listed:  Appointment on 05/06/2023  Component Date Value Ref Range Status  . Free T4 05/06/2023 0.82  0.61 - 1.12 ng/dL Final   Comment: (NOTE) Biotin ingestion may interfere with free T4 tests. If the results are inconsistent with the TSH level, previous test results, or the clinical presentation, then consider biotin interference. If needed, order repeat testing after stopping biotin. Performed at [facility], [address], [place], [place] [address]   . TSH 05/06/2023 1.876  0.350 - 4.500 uIU/mL Final   Comment: Performed by a 3rd Generation assay with a functional sensitivity of <=0.01 uIU/mL. Performed at [facility], [address], [place], [place] [address]   . Sodium 05/06/2023 135  135 - 145 mmol/L Final  . Potassium 05/06/2023 4.4  3.5 - 5.1 mmol/L Final  .  Chloride 05/06/2023 103  98 - 111 mmol/L Final  . CO2 05/06/2023 25  22 - 32 mmol/L Final  . Glucose, Bld 05/06/2023 200 (H)  70 - 99 mg/dL Final   Glucose reference range applies only to samples taken after fasting for at least 8 hours.  . BUN 05/06/2023 12  8 - 23 mg/dL Final  . Creatinine, Ser 05/06/2023 0.55 (L)  0.61 - 1.24 mg/dL Final  . Calcium 46/96/2952 8.9  8.9 - 10.3 mg/dL Final  . Total Protein 05/06/2023 6.7  6.5 - 8.1 g/dL Final  . Albumin 84/13/2440 3.5  3.5 - 5.0 g/dL Final  . AST 09/28/2535 19  15 - 41 U/L Final  . ALT 05/06/2023 18  0 - 44 U/L Final  . Alkaline Phosphatase 05/06/2023 98  38 - 126 U/L Final  . Total Bilirubin  05/06/2023 0.7  0.3 - 1.2 mg/dL Final  . GFR, Estimated 05/06/2023 >60  >60 mL/min Final   Comment: (NOTE) Calculated using the CKD-EPI Creatinine Equation (2021)   . Anion gap 05/06/2023 7  5 - 15 Final   Performed at [facility], [address] [place], [place] [address]  . WBC 05/06/2023 8.9  4.0 - 10.5 K/uL Final  . RBC 05/06/2023 4.89  4.22 - 5.81 MIL/uL Final  . Hemoglobin 05/06/2023 14.2  13.0 - 17.0 g/dL Final  . HCT 47/42/5956 43.3  39.0 - 52.0 % Final  . MCV 05/06/2023 88.5  80.0 - 100.0 fL Final  . MCH 05/06/2023 29.0  26.0 - 34.0 pg Final  . MCHC 05/06/2023 32.8  30.0 - 36.0 g/dL Final  . RDW 38/75/6433 13.3  11.5 - 15.5 % Final  . Platelets 05/06/2023 353  150 - 400 K/uL Final  . nRBC 05/06/2023 0.0  0.0 - 0.2 % Final  . Neutrophils Relative % 05/06/2023 47  % Final  . Neutro Abs 05/06/2023 4.2  1.7 - 7.7 K/uL Final  . Lymphocytes Relative 05/06/2023 37  % Final  . Lymphs Abs 05/06/2023 3.3  0.7 - 4.0 K/uL Final  . Monocytes Relative 05/06/2023 11  % Final  . Monocytes Absolute 05/06/2023 1.0  0.1 - 1.0 K/uL Final  . Eosinophils Relative 05/06/2023 3  % Final  . Eosinophils Absolute 05/06/2023 0.3  0.0 - 0.5 K/uL Final  . Basophils Relative 05/06/2023 1  % Final  . Basophils Absolute 05/06/2023 0.1  0.0 - 0.1 K/uL Final  . Immature Granulocytes 05/06/2023 1  % Final  . Abs Immature Granulocytes 05/06/2023 0.05  0.00 - 0.07 K/uL Final   Performed at [facility], [address], [place], [place] [address]  Office Visit on 05/06/2023  Component Date Value Ref Range Status  . Prostatic Specific Antigen 05/06/2023 7.11 (H)  0.00 - 4.00 ng/mL Final   Comment: (NOTE) While PSA levels of <=4.00 ng/ml are reported as reference range, some men with levels below 4.00 ng/ml can have prostate cancer and many men with PSA above 4.00 ng/ml do not have prostate cancer.  Other tests such as free PSA, age specific reference ranges, PSA velocity  and PSA doubling time may be helpful especially in men less than 39 years old. Performed at [facility], [address], [place], [place] [address]   Office Visit on 04/29/2023  Component Date Value Ref Range Status  . WBC 04/29/2023 9.6  4.0 - 10.5 K/uL Final  . RBC 04/29/2023 4.75  4.22 - 5.81 MIL/uL Final  .  Hemoglobin 04/29/2023 13.9  13.0 - 17.0 g/dL Final  . HCT 21/30/8657 41.8  39.0 - 52.0 % Final  . MCV 04/29/2023 88.0  80.0 - 100.0 fL Final  . MCH 04/29/2023 29.3  26.0 - 34.0 pg Final  . MCHC 04/29/2023 33.3  30.0 - 36.0 g/dL Final  . RDW 84/69/6295 13.1  11.5 - 15.5 % Final  . Platelets 04/29/2023 360  150 - 400 K/uL Final  . nRBC 04/29/2023 0.0  0.0 - 0.2 % Final  . Neutrophils Relative % 04/29/2023 50  % Final  . Neutro Abs 04/29/2023 4.8  1.7 - 7.7 K/uL Final  . Lymphocytes Relative 04/29/2023 34  % Final  . Lymphs Abs 04/29/2023 3.3  0.7 - 4.0 K/uL Final  . Monocytes Relative 04/29/2023 11  % Final  . Monocytes Absolute 04/29/2023 1.1 (H)  0.1 - 1.0 K/uL Final  . Eosinophils Relative 04/29/2023 3  % Final  . Eosinophils Absolute 04/29/2023 0.3  0.0 - 0.5 K/uL Final  . Basophils Relative 04/29/2023 0  % Final  . Basophils Absolute 04/29/2023 0.0  0.0 - 0.1 K/uL Final  . Immature Granulocytes 04/29/2023 2  % Final  . Abs Immature Granulocytes 04/29/2023 0.15 (H)  0.00 - 0.07 K/uL Final   Performed at [facility], [address], [place], [place] [address]  . Sodium 04/29/2023 135  135 - 145 mmol/L Final  . Potassium 04/29/2023 3.9  3.5 - 5.1 mmol/L Final  . Chloride 04/29/2023 103  98 - 111 mmol/L Final  . CO2 04/29/2023 24  22 - 32 mmol/L Final  . Glucose, Bld 04/29/2023 137 (H)  70 - 99 mg/dL Final   Glucose reference range applies only to samples taken after fasting for at least 8 hours.  . BUN 04/29/2023 12  8 - 23 mg/dL Final  . Creatinine, Ser 04/29/2023 0.57 (L)  0.61 - 1.24 mg/dL Final  . Calcium 24/40/1027 8.6 (L)   8.9 - 10.3 mg/dL Final  . Total Protein 04/29/2023 6.5  6.5 - 8.1 g/dL Final  . Albumin 25/36/6440 3.3 (L)  3.5 - 5.0 g/dL Final  . AST 34/74/2595 18  15 - 41 U/L Final  . ALT 04/29/2023 17  0 - 44 U/L Final  . Alkaline Phosphatase 04/29/2023 93  38 - 126 U/L Final  . Total Bilirubin 04/29/2023 0.5  0.3 - 1.2 mg/dL Final  . GFR, Estimated 04/29/2023 >60  >60 mL/min Final   Comment: (NOTE) Calculated using the CKD-EPI Creatinine Equation (2021)   . Anion gap 04/29/2023 8  5 - 15 Final   Performed at [facility], [address], [place], [place] [address]  . Cortisol, Plasma 04/29/2023 8.4  ug/dL Final   Comment: (NOTE) AM    6.7 - 22.6 ug/dL PM   <64.3       ug/dL Performed at [facility], [address], [place], [place] [address]   . TSH 04/29/2023 2.636  0.350 - 4.500 uIU/mL Final   Comment: Performed by a 3rd Generation assay with a functional sensitivity of <=0.01 uIU/mL. Performed at [facility], [address], [place], [place] [address]   . Free T4 04/29/2023 0.76  0.61 - 1.12 ng/dL Final   Comment: (NOTE) Biotin ingestion may interfere with free T4 tests. If the results are inconsistent with the TSH level, previous test results, or the clinical presentation, then consider biotin interference. If needed, order repeat testing after stopping biotin. Performed  at [facility], [address], [place], [place] [address]     RADIOGRAPHIC STUDIES: I have personally reviewed the radiological images as listed and agree with the findings in the report  No results found.  ASSESSMENT/PLAN 77 y.o. male is here because of  malignant melanoma.  Medical history notable for diabetes mellitus, coronary artery disease, cerebrovascular disease, MI, cataracts  Malignant melanoma, distal left leg, Stage IIC (T4b N0 M0)- Risk factors for this patient are 1) chronic exposure to UV 2) Sunburns 3) Fitzpatrick Grade 2 skin tone March 19, 2023: Shave biopsy left lateral leg demonstrated malignant melanoma, nodular type. Breslow depth 5.1 mm. Clark's level 4. Ulceration present. Mitotic index 16/mm. Pathologic stage T4b. Deep margin involved  April 01 2023- Wide local excision.  Sentinel LN bx not performed due to patient frailty  Therapeutics   Apr 03 2023- Will obtain CBC with diff, CMP, LDH, MRI brain and whole body PET/CT for staging  Patient meets criteria per NCCN Guidelines for adjuvant immunotherapy and will plan on adjuvant Pembrolizumab  May 06 2023- Chemotherapy teaching  Immunotherapy Risks:   May 06 2023- Reiterated potential side effects of immunotherapy with the patient.  These include but are not limited to:  Fatigue, Hair loss, low blood counts (anemia, thrombocytopenia) that may necessitate transfusion, bleeding, infection, nausea/ vomiting/Appetite changes/ Constipation/ Diarrhea, mucositis, Neuropathy/ neurologic problems, skin and nail changes such as dry skin and color change, Urine and bladder changes and kidney problems, weight changes, mood changes, decreased libido/fertility problems, damage to heart and lungs.  Some of these side effects can be life threatening, may be permanent and can result in hospitalization and/or death.  In shared decision making patient has agreed to proceed with immunotherapy.    Poor IV access  Apr 29 2023- Has undergone portacath placement  Urinary frequency  May 06 2023- Likely secondary to BPH.  Will check U/A with microscopic, PSA and begin Proscar.       Cancer Staging  Malignant melanoma (HCC) Staging form: Melanoma of the Skin, AJCC 8th Edition - Clinical stage from 04/03/2023: Stage IIC (cT4b, cN0, cM0) - Signed by Loni Muse, MD on 04/03/2023 Histopathologic type: Nodular melanoma Stage prefix: Initial diagnosis    No problem-specific Assessment & Plan notes found for this encounter.    No orders of the defined types were placed in this  encounter.   30 minutes was spent in patient care.  This included time spent preparing to see the patient (e.g., review of tests), obtaining and/or reviewing separately obtained history, counseling and educating the patient/family/caregiver, ordering medications, tests, or procedures; documenting clinical information in the electronic or other health record, independently interpreting results and communicating results to the patient/family/caregiver as well as coordination of care.       All questions were answered. The patient knows to call the clinic with any problems, questions or concerns.  This note was electronically signed.    Loni Muse, MD  05/27/2023 8:39 AM

## 2023-05-28 ENCOUNTER — Inpatient Hospital Stay: Payer: Medicare HMO | Admitting: Oncology

## 2023-05-28 ENCOUNTER — Inpatient Hospital Stay: Payer: Medicare HMO

## 2023-05-28 NOTE — Progress Notes (Unsigned)
[facility] CARE CLINIC CONSULT NOTE [name] [place] [phone]   [phone]   Patient Care Team: Patient, No Pcp Per as PCP - General (General Practice) [name], MD as PCP - Cardiology (Cardiology) Jenene Slicker, Deberah Castle, MD as Consulting Physician (Optometry) Olegario Shearer, MD as Referring Physician (Dermatology) Loni Muse, MD as Consulting Physician (Internal Medicine)   Name of the patient: Jon Nelson  [id number]  02/20/1946   Date of visit: 05/28/23  Diagnosis: Melanoma  Chief complaint/Reason for visit- Initial Meeting for [facility], preparing for starting immunotherapy  Heme/Onc history:  Oncology History  Malignant melanoma (HCC)  04/03/2023 Initial Diagnosis   Malignant melanoma (HCC)   04/03/2023 Cancer Staging   Staging form: Melanoma of the Skin, AJCC 8th Edition - Clinical stage from 04/03/2023: Stage IIC (cT4b, cN0, cM0) - Signed by Loni Muse, MD on 04/03/2023 Histopathologic type: Nodular melanoma Stage prefix: Initial diagnosis   05/13/2023 -  Chemotherapy   Patient is on Treatment Plan : MELANOMA Pembrolizumab (200) q21d       Interval history-  The patient presents to chemo care clinic today for initial meeting in preparation for starting immunotherapy. I introduced the chemo care clinic and we discussed that the role of the clinic is to assist those who are at an increased risk of emergency room visits and/or complications during the course of treatment. We discussed that the increased risk takes into account factors such as age, performance status, and co-morbidities. We also discussed that for some, this might include barriers to care such as not having a primary care provider, lack of insurance/transportation, or not being able to afford medications. We discussed that the goal of the program is to help prevent unplanned ER visits and help reduce complications during immunotherapy. We do this  by discussing specific risk factors to each individual and identifying ways that we can help improve these risk factors and reduce barriers to care.   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)    Old myocardial infarct    Other amnesia    TIA (transient ischemic attack)     Past Surgical History:  Procedure Laterality Date   CATARACT EXTRACTION Left 08/15/2020   CORONARY ARTERY BYPASS GRAFT N/A 09/24/2021   Procedure: CORONARY ARTERY BYPASS GRAFTING (CABG) TIMES THREE , ON PUMP, USING LEFT INTERNAL MAMMARY ARTERY AND ENDOSCOPICALLY HARVESTED RIGHT GREATER SAPHENOUS VEIN;  Surgeon: Corliss Skains, MD;  Location: [facility] OR;  Service: Open Heart Surgery;  Laterality: N/A;   ENDOVEIN HARVEST OF GREATER SAPHENOUS VEIN Right 09/24/2021   Procedure: ENDOVEIN HARVEST OF GREATER SAPHENOUS VEIN;  Surgeon: Corliss Skains, MD;  Location: [facility] OR;  Service: Open Heart Surgery;  Laterality: Right;   HEMORRHOID SURGERY     HERNIA REPAIR  2021   LEFT HEART CATH AND CORONARY ANGIOGRAPHY N/A 09/18/2021   Procedure: LEFT HEART CATH AND CORONARY ANGIOGRAPHY;  Surgeon: Lyn Records, MD;  Location: [facility] INVASIVE CV LAB;  Service: Cardiovascular;  Laterality: N/A;   LOOP RECORDER INSERTION N/A 02/03/2019  Procedure: LOOP RECORDER INSERTION;  Surgeon: Regan Lemming, MD;  Location: [facility] INVASIVE CV LAB;  Service: Cardiovascular;  Laterality: N/A;   MOLE REMOVAL     PORTA CATH INSERTION     SKIN CANCER EXCISION     TEE WITHOUT CARDIOVERSION N/A 02/03/2019   Procedure: TRANSESOPHAGEAL ECHOCARDIOGRAM (TEE);  Surgeon: Jake Bathe, MD;  Location: [facility] ENDOSCOPY;  Service: Cardiovascular;  Laterality: N/A;  loop   TEE WITHOUT CARDIOVERSION N/A 09/24/2021   Procedure: TRANSESOPHAGEAL ECHOCARDIOGRAM (TEE);  Surgeon: Corliss Skains, MD;  Location: [facility] OR;  Service: Open Heart Surgery;  Laterality: N/A;    Social History   Socioeconomic History   Marital status: Widowed    Spouse name: Not on file   Number of children: 0   Years of education: Not on file   Highest education level: Not on file  Occupational History   Occupation: Retired Brewing technologist)  Tobacco Use   Smoking status: Never   Smokeless tobacco: Never  Vaping Use   Vaping Use: Never used  Substance and Sexual Activity   Alcohol use: Never   Drug use: Never   Sexual activity: Not Currently  Other Topics Concern   Not on file  Social History Narrative   Patient's wife passed away in 06-12-2015 due to cancer - she was unable to have children.  Patient has a sister, his niece and her family lives in his home temporarily    Social Determinants of Health   Financial Resource Strain: Low Risk  (01/16/2022)   Overall Financial Resource Strain (CARDIA)    Difficulty of Paying Living Expenses: Not hard at all  Food Insecurity: No Food Insecurity (04/03/2023)   Hunger Vital Sign    Worried About Running Out of Food in the Last Year: Never true    Ran Out of Food in the Last Year: Never true  Transportation Needs: No Transportation Needs (01/16/2022)   PRAPARE - Administrator, Civil Service (Medical): No    Lack of Transportation (Non-Medical): No  Physical Activity: Not on file  Stress: Not on file  Social Connections: Not on file  Intimate Partner Violence: Not At Risk (04/03/2023)   Humiliation, Afraid, Rape, and Kick questionnaire    Fear of Current or Ex-Partner: No    Emotionally Abused: No    Physically Abused: No    Sexually Abused: No    Family History  Problem Relation Age of Onset   Stroke Mother    Alzheimer's disease Mother       Current Outpatient Medications:    acetaminophen (TYLENOL) 500 MG tablet, Take 500 mg by mouth every 6 (six) hours as needed for mild pain or moderate pain., Disp: , Rfl:    aspirin EC 81 MG EC tablet, Take 1 tablet (81 mg total) by mouth daily. Swallow whole., Disp: , Rfl:    clopidogrel (PLAVIX) 75 MG tablet, Take 1 tablet (75 mg total) by mouth daily., Disp: 30 tablet, Rfl: 1   finasteride (PROSCAR) 5 MG tablet, Take 1 tablet (5 mg total) by mouth daily., Disp: 30 tablet, Rfl: 5   meloxicam (MOBIC) 15 MG tablet, Take 15 mg by mouth daily., Disp: , Rfl:    metFORMIN (GLUCOPHAGE) 500 MG tablet, Take 1 tablet (500 mg total) by mouth every morning., Disp: 90 tablet, Rfl: 2   metoprolol tartrate (LOPRESSOR) 25 MG tablet, Take 0.5 tablets (12.5 mg total) by mouth 2 (two) times daily., Disp: 30 tablet, Rfl: 1   ondansetron (  ZOFRAN) 8 MG tablet, Take 1 tablet (8 mg total) by mouth every 8 (eight) hours as needed for nausea or vomiting., Disp: 30 tablet, Rfl: 1   prochlorperazine (COMPAZINE) 10 MG tablet, Take 1 tablet (10 mg total) by mouth every 6 (six) hours as needed for nausea or vomiting., Disp: 30 tablet, Rfl: 1   rosuvastatin (CRESTOR) 40 MG tablet, Take 1 tablet (40 mg total) by mouth daily., Disp: 90 tablet, Rfl: 1   traMADol (ULTRAM) 50 MG tablet, Take 50 mg by mouth every 6 (six) hours as needed., Disp: , Rfl:      Latest Ref Rng & Units 05/06/2023   10:37 AM  CMP  Glucose 70 - 99 mg/dL 161   BUN 8 - 23 mg/dL 12   Creatinine 0.96 - 1.24 mg/dL 0.45   Sodium 409 - 811 mmol/L 135   Potassium 3.5 - 5.1 mmol/L 4.4   Chloride 98 - 111 mmol/L 103   CO2 22 - 32 mmol/L 25   Calcium 8.9 - 10.3 mg/dL 8.9   Total Protein 6.5 - 8.1 g/dL 6.7   Total Bilirubin 0.3 - 1.2 mg/dL 0.7   Alkaline Phos 38 - 126 U/L 98   AST 15 - 41 U/L 19   ALT 0 - 44 U/L 18       Latest Ref Rng & Units 05/06/2023   10:37 AM  CBC  WBC 4.0 - 10.5 K/uL 8.9   Hemoglobin 13.0 - 17.0 g/dL 91.4   Hematocrit 78.2 -  52.0 % 43.3   Platelets 150 - 400 K/uL 353     No images are attached to the encounter.  VAS US CAROTID  Result Date: 05/13/2023 Carotid Arterial Duplex Study Patient Name:  SISTO GRANILLO  Date of Exam:   05/13/2023 Medical Rec #: [id number]     Accession #:    [id number] Date of Birth: September 25, 1946     Patient Gender: M Patient Age:   41 years Exam Location:  [place] Procedure:      VAS US CAROTID Referring Phys: Gypsy Balsam --------------------------------------------------------------------------------  Indications:       Bilateral carotid artery stenosis [I65.23 (ICD-10-CM)]; S/P                    CABG x 3 [Z95.1 (ICD-10-CM)]; Type 2 diabetes mellitus with                    other circulatory complication, without long-term current use                    of insulin (HCC) [E11.59 (ICD-10-CM)]; Mixed hyperlipidemia                    [E78.2 (ICD-10-CM)]; Preop cardiovascular exam [Z01.810                    (ICD-10-CM)]. Comparison Study:  No prior exam for comparison. Performing Technologist: Louie Boston RDCS  Examination Guidelines: A complete evaluation includes B-mode imaging, spectral Doppler, color Doppler, and power Doppler as needed of all accessible portions of each vessel. Bilateral testing is considered an integral part of a complete examination. Limited examinations for reoccurring indications may be performed as noted.  Right Carotid Findings: +----------+--------+--------+--------+------------------+---------+           PSV cm/sEDV cm/sStenosisPlaque DescriptionComments  +----------+--------+--------+--------+------------------+---------+ CCA Prox  135     17                                          +----------+--------+--------+--------+------------------+---------+  CCA Distal83      22                                          +----------+--------+--------+--------+------------------+---------+ ICA Prox  93      23      1-39%   heterogenous      Shadowing  +----------+--------+--------+--------+------------------+---------+ ICA Mid   105     30                                          +----------+--------+--------+--------+------------------+---------+ ICA Distal112     28                                          +----------+--------+--------+--------+------------------+---------+ ECA       114     12                                          +----------+--------+--------+--------+------------------+---------+ +----------+--------+-------+----------------+-------------------+           PSV cm/sEDV cmsDescribe        Arm Pressure (mmHG) +----------+--------+-------+----------------+-------------------+ [id number]            Multiphasic, [id number]                 +----------+--------+-------+----------------+-------------------+ +---------+--------+--+--------+--+---------+ VertebralPSV cm/s31EDV cm/s12Antegrade +---------+--------+--+--------+--+---------+  Left Carotid Findings: +----------+--------+--------+--------+------------------+---------+           PSV cm/sEDV cm/sStenosisPlaque DescriptionComments  +----------+--------+--------+--------+------------------+---------+ CCA Prox  88      17                                          +----------+--------+--------+--------+------------------+---------+ CCA Distal72      17                                          +----------+--------+--------+--------+------------------+---------+ ICA Prox  100     26      1-39%   heterogenous      Shadowing +----------+--------+--------+--------+------------------+---------+ ICA Mid   119     32                                          +----------+--------+--------+--------+------------------+---------+ ICA Distal110     28                                          +----------+--------+--------+--------+------------------+---------+ ECA       109     24                                           +----------+--------+--------+--------+------------------+---------+ +----------+--------+--------+----------------+-------------------+  PSV cm/sEDV cm/sDescribe        Arm Pressure (mmHG) +----------+--------+--------+----------------+-------------------+ Subclavian114             Multiphasic, WNL25                  +----------+--------+--------+----------------+-------------------+ +---------+--------+--+--------+--+---------+ VertebralPSV cm/s42EDV cm/s13Antegrade +---------+--------+--+--------+--+---------+   Summary: Right Carotid: Velocities in the right ICA are consistent with a 1-39% stenosis. Left Carotid: Velocities in the left ICA are consistent with a 1-39% stenosis. Vertebrals:  Bilateral vertebral arteries demonstrate antegrade flow. Subclavians: Normal flow hemodynamics were seen in bilateral subclavian              arteries. *See table(s) above for measurements and observations.  Electronically signed by Gypsy Balsam MD on 05/13/2023 at 6:27:11 PM.    Final      Assessment and plan-  The patient is a 77 y.o. male who presents to [facility] for initial meeting in preparation for starting immunotherapy for the treatment of  No diagnosis found. [facility]/High Risk for ER/Hospitalization during treatment: We discussed the role of the chemo care clinic and identified patient specific risk factors. 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)    Old myocardial infarct    Other amnesia    TIA (transient ischemic attack)      Patient has past surgical history positive for: Past Surgical History:  Procedure Laterality Date   CATARACT EXTRACTION Left 08/15/2020   CORONARY ARTERY BYPASS GRAFT N/A 09/24/2021   Procedure: CORONARY ARTERY BYPASS GRAFTING (CABG) TIMES THREE , ON PUMP, USING LEFT INTERNAL MAMMARY ARTERY AND ENDOSCOPICALLY HARVESTED RIGHT GREATER SAPHENOUS VEIN;  Surgeon: Corliss Skains, MD;  Location: [facility] OR;  Service: Open Heart Surgery;  Laterality: N/A;   ENDOVEIN HARVEST OF GREATER SAPHENOUS VEIN Right 09/24/2021   Procedure: ENDOVEIN HARVEST OF GREATER SAPHENOUS VEIN;  Surgeon: Corliss Skains, MD;  Location: [facility] OR;  Service: Open Heart Surgery;  Laterality: Right;   HEMORRHOID SURGERY     HERNIA REPAIR  2021   LEFT HEART CATH AND CORONARY ANGIOGRAPHY N/A 09/18/2021   Procedure: LEFT HEART CATH AND CORONARY ANGIOGRAPHY;  Surgeon: Lyn Records, MD;  [facility]: [facility] INVASIVE CV LAB;  Service: Cardiovascular;  Laterality: N/A;   LOOP RECORDER INSERTION N/A 02/03/2019   Procedure: LOOP RECORDER INSERTION;  Surgeon: Regan Lemming, MD;  Location: [facility] INVASIVE CV LAB;  Service: Cardiovascular;  Laterality: N/A;   MOLE REMOVAL     PORTA CATH INSERTION     SKIN CANCER EXCISION     TEE WITHOUT CARDIOVERSION N/A 02/03/2019   Procedure: TRANSESOPHAGEAL ECHOCARDIOGRAM (TEE);  Surgeon: Jake Bathe, MD;  Location: [facility] ENDOSCOPY;  Service: Cardiovascular;  Laterality: N/A;  loop   TEE WITHOUT CARDIOVERSION N/A 09/24/2021   Procedure: TRANSESOPHAGEAL ECHOCARDIOGRAM (TEE);  Surgeon: Corliss Skains, MD;  Location: [facility] OR;  Service: Open Heart Surgery;  Laterality: N/A;    Provided general information including the following:  1.  Date of education: 05/06/2023 2.  Physician name: Dr. Mitzie Na  [place] 3.  Diagnosis: Malignant melanoma 4.  Stage: IIC 5.  Cure  6.  Treatment plan including drugs and how often: Pembrolizumab every 3 weeks 7.  Start date: 05/13/2023 8.  Other referrals: None  at this time 9.  The patient is to call our office with any questions or concerns.  Our office number [phone], if after hours or on the weekend, call the same number and wait for the answering service.  There is always an oncologist on call. 10.  Medications prescribed: ondansetron, prochlorperazine 11.  The patient has verbalized understanding of the treatment plan and has no barriers to adherence or understanding.   Obtained signed consent from patient.   Discussed symptoms including:  1.  Low blood counts including white blood cells red blood cells, and platelets.  If experience increased fatigue or abnormal bruising or bleeding, call our office. 2.  Infection including to avoid large crowds, wash hands frequently, and stay away from people who were sick.  If fever develops of 100.4 or higher, call our office. 3.  Mucositis:  Instructions on mouth rinse given (baking soda and salt mixture).  Keep mouth clean.  Use soft bristle toothbrush.  Avoid alcohol containing mouthwash.  If mouth sores develop, call our office. 4.  Nausea/vomiting:  Prescriptions given: ondansetron 8 mg every 8 hours as needed for nausea or vomiting and prochlorperazine 10 mg every 6 hours as needed for nausea or vomiting, may alternate these medications and take around the clock if persistent.  If nausea and vomiting is not controlled, call our office 5.  Diarrhea: Use over-the-counter Imodium.  Call our office if diarrhea is not controlled. 6.  Constipation: Use senna-S, 1 to 2 tablets twice a day.  Call our office if no BM in 2 to 3 days. 7.  Loss of appetite:  Try to eat small meals every 2-3 hours.  Call our office if not able to eat or drink. 8.  Taste changes:  Try zinc 50 mg daily.  If becomes severe call clinic. 9.  Drink 2 to 3 quarts of water per day. Call our office if not able to drink enough for urine to be pale yellow. 10. Avoid alcoholic beverages. 11. Peripheral neuropathy: Call office if numbness or  tingling in hands or feet worsens or is suddenly severe. 12.  Ringing in the ears or hearing loss.  Call our office if this develops.     The patient was given written information printed from [employer] patient education on individual chemotherapy agents which includes: Name of medications Approved uses Dose and schedule Storage and handling Handling body fluids and waste Drug and food interactions Possible side effects and management Pregnancy, sexual activity, and contraception Obtaining medication   Gave information on the supportive care team and how to contact them regarding services.  Discussed advanced directives.  The patient does not have their advanced directives.   We discussed that social determinants of health may have significant impacts on health and outcomes for cancer patients.  Today we discussed specific social determinants of performance status, alcohol use, depression, financial needs, food insecurity, housing, interpersonal violence, social connections, stress, tobacco use, and transportation.    After lengthy discussion the following were identified as areas of need:   Outpatient services: We discussed options including home based and outpatient services, DME, nutrition counseling, and supportive care program. We discussed that patients who participate in regular physical activity report fewer negative impacts of cancer and treatments and report less fatigue.  Financial Concerns: We discussed that living with cancer can create tremendous financial burden.  We discussed options for assistance. I asked that if assistance is needed in affording medications or paying bills to please let us know so that we can provide assistance. We discussed options for food including social services.  Referral to Social work: Introduced Child psychotherapist Mady Haagensen and the services she can provide, such as support with utility bill, cell phone and gas vouchers.  Also introduced [place], LCSW, our licensed clinical social worker who provides counseling as needed.  Support groups: We discussed options for support groups at [facility]. We discussed options for managing stress including healthy eating and exercise, as well as participating in no charge counseling services at the cancer center and support groups.  If these are of interest, patient can notify either myself or primary nursing team.We discussed options for management including medications.  Transportation: We discussed options for transportation.  The patient will contact our office if he requires assistance with transportation.  Palliative care services: We have palliative care services available in the cancer center to discuss goals of care and advanced care planning.  Please let us know if you have any questions or would like to speak to our palliative care practitioner.  Symptom Management Clinic: We discussed our symptom management clinic which is available for acute concerns while receiving treatment such as nausea, vomiting or diarrhea.  We can be reached via telephone at [phone].  We are available for virtual or in person visits on the same day from 9 to 4 PM Monday through Friday.   He denies needing specific assistance at this time.  He will be followed by Dr. Gerald Dexter clinical team.   Disposition: RTC on 05/28/2023  Visit Diagnosis No diagnosis found.   I discussed the assessment and treatment plan with the patient and his sister-in-law who is his POA.  They were provided an opportunity to ask questions and all were answered. The patient expressed understanding and was in agreement with this plan. He also understands that he can call clinic at any time with any questions, concerns, or complaints.   I provided 30 minutes of face-to-face time during this encounter, and > 50% was spent counseling as documented under my assessment & plan.   Cerissa Zeiger A. Sol Passer, PA-C [facility]  [place] [phone]

## 2023-05-29 ENCOUNTER — Telehealth: Payer: Self-pay

## 2023-05-29 ENCOUNTER — Inpatient Hospital Stay: Payer: Medicare HMO

## 2023-05-29 ENCOUNTER — Inpatient Hospital Stay: Payer: Medicare HMO | Admitting: Hematology and Oncology

## 2023-05-29 ENCOUNTER — Encounter: Payer: Self-pay | Admitting: Hematology and Oncology

## 2023-05-29 VITALS — BP 123/76 | HR 81 | Temp 97.6°F | Resp 20 | Ht 69.0 in | Wt 181.4 lb

## 2023-05-29 DIAGNOSIS — Z79899 Other long term (current) drug therapy: Secondary | ICD-10-CM | POA: Diagnosis not present

## 2023-05-29 DIAGNOSIS — I252 Old myocardial infarction: Secondary | ICD-10-CM | POA: Diagnosis not present

## 2023-05-29 DIAGNOSIS — C4372 Malignant melanoma of left lower limb, including hip: Secondary | ICD-10-CM | POA: Diagnosis not present

## 2023-05-29 DIAGNOSIS — I2510 Atherosclerotic heart disease of native coronary artery without angina pectoris: Secondary | ICD-10-CM | POA: Diagnosis not present

## 2023-05-29 DIAGNOSIS — E782 Mixed hyperlipidemia: Secondary | ICD-10-CM | POA: Diagnosis not present

## 2023-05-29 DIAGNOSIS — Z7984 Long term (current) use of oral hypoglycemic drugs: Secondary | ICD-10-CM | POA: Diagnosis not present

## 2023-05-29 DIAGNOSIS — N400 Enlarged prostate without lower urinary tract symptoms: Secondary | ICD-10-CM | POA: Diagnosis not present

## 2023-05-29 DIAGNOSIS — I10 Essential (primary) hypertension: Secondary | ICD-10-CM | POA: Diagnosis not present

## 2023-05-29 DIAGNOSIS — Z5111 Encounter for antineoplastic chemotherapy: Secondary | ICD-10-CM | POA: Diagnosis not present

## 2023-05-29 DIAGNOSIS — R350 Frequency of micturition: Secondary | ICD-10-CM | POA: Diagnosis not present

## 2023-05-29 DIAGNOSIS — Z8673 Personal history of transient ischemic attack (TIA), and cerebral infarction without residual deficits: Secondary | ICD-10-CM | POA: Diagnosis not present

## 2023-05-29 DIAGNOSIS — E1136 Type 2 diabetes mellitus with diabetic cataract: Secondary | ICD-10-CM | POA: Diagnosis not present

## 2023-05-29 LAB — COMPREHENSIVE METABOLIC PANEL
ALT: 19 U/L (ref 0–44)
AST: 19 U/L (ref 15–41)
Albumin: 3.2 g/dL — ABNORMAL LOW (ref 3.5–5.0)
Alkaline Phosphatase: 104 U/L (ref 38–126)
Anion gap: 11 (ref 5–15)
BUN: 12 mg/dL (ref 8–23)
CO2: 24 mmol/L (ref 22–32)
Calcium: 8.7 mg/dL — ABNORMAL LOW (ref 8.9–10.3)
Chloride: 102 mmol/L (ref 98–111)
Creatinine, Ser: 0.78 mg/dL (ref 0.61–1.24)
GFR, Estimated: >60 mL/min (ref >60)
Glucose, Bld: 283 mg/dL — ABNORMAL HIGH (ref 70–99)
Potassium: 4.3 mmol/L (ref 3.5–5.1)
Sodium: 137 mmol/L (ref 135–145)
Total Bilirubin: 0.7 mg/dL (ref 0.3–1.2)
Total Protein: 6.9 g/dL (ref 6.5–8.1)

## 2023-05-29 LAB — CBC WITH DIFFERENTIAL/PLATELET
Abs Immature Granulocytes: 0.12 10*3/uL — ABNORMAL HIGH (ref 0.00–0.07)
Basophils Absolute: 0.1 10*3/uL (ref 0.0–0.1)
Basophils Relative: 1 %
Eosinophils Absolute: 0.4 10*3/uL (ref 0.0–0.5)
Eosinophils Relative: 4 %
HCT: 42.9 % (ref 39.0–52.0)
Hemoglobin: 14 g/dL (ref 13.0–17.0)
Immature Granulocytes: 1 %
Lymphocytes Relative: 23 %
Lymphs Abs: 2.7 10*3/uL (ref 0.7–4.0)
MCH: 29 pg (ref 26.0–34.0)
MCHC: 32.6 g/dL (ref 30.0–36.0)
MCV: 89 fL (ref 80.0–100.0)
Monocytes Absolute: 1 10*3/uL (ref 0.1–1.0)
Monocytes Relative: 8 %
Neutro Abs: 7.7 10*3/uL (ref 1.7–7.7)
Neutrophils Relative %: 63 %
Platelets: 390 10*3/uL (ref 150–400)
RBC: 4.82 MIL/uL (ref 4.22–5.81)
RDW: 13.1 % (ref 11.5–15.5)
WBC: 12.1 10*3/uL — ABNORMAL HIGH (ref 4.0–10.5)
nRBC: 0 % (ref 0.0–0.2)

## 2023-05-29 NOTE — Telephone Encounter (Signed)
Spoke with patient , he voiced understanding and he voiced taking his meds will follow with PCP. Patient sounded tired on the phone no PCP listed on the chart. Medication in epic with old dates. Spoke with Smitty Cords POA she voiced patient is not compliant with meds . She voiced she was headed to see him and his PCP is [employer]. She will let him know to take meds and follow with PCP.

## 2023-05-29 NOTE — Progress Notes (Signed)
[facility] Cancer Initial Visit:  Patient Care Team: Patient, No Pcp Per as PCP - General (General Practice) [name], MD as PCP - Cardiology (Cardiology) Jenene Slicker, Deberah Castle, MD as Consulting Physician (Optometry) Olegario Shearer, MD as Referring Physician (Dermatology) Loni Muse, MD as Consulting Physician (Internal Medicine)  CHIEF COMPLAINTS/PURPOSE OF CONSULTATION:  Oncology History  Malignant melanoma (HCC)  04/03/2023 Initial Diagnosis   Malignant melanoma (HCC)   04/03/2023 Cancer Staging   Staging form: Melanoma of the Skin, AJCC 8th Edition - Clinical stage from 04/03/2023: Stage IIC (cT4b, cN0, cM0) - Signed by Loni Muse, MD on 04/03/2023 Histopathologic type: Nodular melanoma Stage prefix: Initial diagnosis   05/13/2023 -  Chemotherapy   Patient is on Treatment Plan : MELANOMA Pembrolizumab (200) q21d       HISTORY OF PRESENTING ILLNESS: Jon Nelson 77 y.o. 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  Breslow depth 5.1 mm.  Clark's level 4.  Ulceration present.  Mitotic index 16/mm.  Pathologic stage T4b.  Deep margin involved  Apr 03 2023:  [facility] Medical Oncology Consult Patient here with sister in law [name] who is also his power of attorney.  He has had a mole on the left lateral leg for years but in the last year it degenerated into a sore which didn't heal which prompted him to see a dermatologist.  He has been followed by dermatology for removal of non-melanomatous skin cancers on his face and arms over the years.  He underwent a shave biopsy of the left leg lesion on March 19 2023.  He has not been noted to have in transit lesions.  April 01 2023 he underwent a wide local excision of the region but did not undergo  inguinal lymphadenectomy.     Social:  Widowed.  Lives with his dog.  Worked for [employer] where he worked outside.  No smoking.  EtOH none  [facility] Mother died 95 dementia Father estranged No siblings.     Apr 12 2023:  MRI brain No evidence of metastatic disease. 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  Apr 14 2023:  Whole body PET/CT Postsurgical changes in the lateral left lower leg, as above.  No findings suspicious for metastatic disease.   Apr 29 2023:   Has undergone port placement since last visit.  Has not yet been scheduled for teaching or therapy.  Discussed risks and benefits of immunotherapy    May 06 2023:  Scheduled follow up for melanoma.  Experiencing constipation.  Has urinary frequency but not interested in seeing a urologist.  To undergo chemotherapy teaching today.  Will check PSA and place on Proscar  May 06, 2023: Chemotherapy education  May 13 2023:  Cycle 1 Pembrolizumab  June May 29, 2023: Scheduled follow-up for melanoma.  Tolerated first cycle of pembrolizumab without difficulty.  Reports constipation.  Denies difficulty with urination.  Does not believe he is taking Proscar.  PSA was 7.11.  Will eventually need referral to urology. Glucose 283.  June 03, 2023: Cycle 2 pembrolizumab   Review of Systems  Constitutional:  Negative for appetite change, chills, fatigue, fever and unexpected weight change.  HENT:   Negative for lump/mass, mouth sores and sore throat.   Respiratory:  Negative for  cough and shortness of breath.   Cardiovascular:  Negative for chest pain and leg swelling.  Gastrointestinal:  Negative for abdominal pain, constipation, diarrhea, nausea and vomiting.  Genitourinary:  Negative for difficulty urinating, dysuria, frequency and hematuria.   Musculoskeletal:  Negative for arthralgias, back pain and myalgias.  Skin:  Negative for itching, rash and wound.  Neurological:  Negative for dizziness,  extremity weakness, headaches, light-headedness and numbness.  Hematological:  Negative for adenopathy.  Psychiatric/Behavioral:  Negative for depression and sleep disturbance. The patient is not nervous/anxious.     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)    Old myocardial infarct    Other amnesia    TIA (transient ischemic attack)     SURGICAL HISTORY: Past Surgical History:  Procedure Laterality Date   CATARACT EXTRACTION Left 08/15/2020   CORONARY ARTERY BYPASS GRAFT N/A 09/24/2021   Procedure: CORONARY ARTERY BYPASS GRAFTING (CABG) TIMES THREE , ON PUMP, USING LEFT INTERNAL MAMMARY ARTERY AND ENDOSCOPICALLY HARVESTED RIGHT GREATER SAPHENOUS VEIN;  Surgeon: Corliss Skains, MD;  Location: [facility] OR;  Service: Open Heart Surgery;  Laterality: N/A;   ENDOVEIN HARVEST OF GREATER SAPHENOUS VEIN Right 09/24/2021   Procedure: ENDOVEIN HARVEST OF GREATER SAPHENOUS VEIN;  Surgeon: Corliss Skains, MD;  Location: [facility] OR;  Service: Open Heart Surgery;  Laterality: Right;   HEMORRHOID SURGERY     HERNIA REPAIR  Jun 17, 2020   LEFT HEART CATH AND CORONARY ANGIOGRAPHY N/A 09/18/2021   Procedure: LEFT HEART CATH AND CORONARY ANGIOGRAPHY;  Surgeon: Lyn Records, MD;  Location: [facility] INVASIVE CV LAB;  Service: Cardiovascular;  Laterality: N/A;   LOOP RECORDER INSERTION N/A 02/03/2019   Procedure: LOOP RECORDER INSERTION;  Surgeon: Regan Lemming, MD;  Location: [facility] INVASIVE CV LAB;  Service: Cardiovascular;  Laterality: N/A;   MOLE REMOVAL     PORTA CATH INSERTION     SKIN CANCER EXCISION     TEE WITHOUT CARDIOVERSION N/A 02/03/2019   Procedure: TRANSESOPHAGEAL  ECHOCARDIOGRAM (TEE);  Surgeon: Jake Bathe, MD;  Location: [facility] ENDOSCOPY;  Service: Cardiovascular;  Laterality: N/A;  loop   TEE WITHOUT CARDIOVERSION N/A 09/24/2021   Procedure: TRANSESOPHAGEAL ECHOCARDIOGRAM (TEE);  Surgeon: Corliss Skains, MD;  Location: [facility] OR;  Service: Open Heart Surgery;  Laterality: N/A;    SOCIAL HISTORY: Social History   Socioeconomic History   Marital status: Widowed    Spouse name: Not on file   Number of children: 0   Years of education: Not on file   Highest education level: Not on file  Occupational History   Occupation: Retired Brewing technologist)  Tobacco Use   Smoking status: Never   Smokeless tobacco: Never  Vaping Use   Vaping Use: Never used  Substance and Sexual Activity   Alcohol use: Never   Drug use: Never   Sexual activity: Not Currently  Other Topics Concern   Not on file  Social History Narrative   Patient's wife passed away in 06-18-15 due to cancer - she was unable to have children.  Patient has a sister, his niece and her family lives in his home temporarily    Social Determinants  of Health   Financial Resource Strain: Low Risk  (01/16/2022)   Overall Financial Resource Strain (CARDIA)    Difficulty of Paying Living Expenses: Not hard at all  Food Insecurity: No Food Insecurity (04/03/2023)   Hunger Vital Sign    Worried About Running Out of Food in the Last Year: Never true    Ran Out of Food in the Last Year: Never true  Transportation Needs: No Transportation Needs (01/16/2022)   PRAPARE - Administrator, Civil Service (Medical): No    Lack of Transportation (Non-Medical): No  Physical Activity: Not on file  Stress: Not on file  Social Connections: Not on file  Intimate Partner Violence: Not At Risk (04/03/2023)   Humiliation, Afraid, Rape, and Kick questionnaire    Fear of Current or Ex-Partner: No    Emotionally Abused: No    Physically Abused: No    Sexually Abused: No    FAMILY HISTORY Family History   Problem Relation Age of Onset   Stroke Mother    Alzheimer's disease Mother     ALLERGIES:  is allergic to aricept [donepezil].  MEDICATIONS:  Current Outpatient Medications  Medication Sig Dispense Refill   acetaminophen (TYLENOL) 500 MG tablet Take 500 mg by mouth every 6 (six) hours as needed for mild pain or moderate pain.     aspirin EC 81 MG EC tablet Take 1 tablet (81 mg total) by mouth daily. Swallow whole.     clopidogrel (PLAVIX) 75 MG tablet Take 1 tablet (75 mg total) by mouth daily. 30 tablet 1   finasteride (PROSCAR) 5 MG tablet Take 1 tablet (5 mg total) by mouth daily. 30 tablet 5   meloxicam (MOBIC) 15 MG tablet Take 15 mg by mouth daily.     metFORMIN (GLUCOPHAGE) 500 MG tablet Take 1 tablet (500 mg total) by mouth every morning. 90 tablet 2   metoprolol tartrate (LOPRESSOR) 25 MG tablet Take 0.5 tablets (12.5 mg total) by mouth 2 (two) times daily. 30 tablet 1   ondansetron (ZOFRAN) 8 MG tablet Take 1 tablet (8 mg total) by mouth every 8 (eight) hours as needed for nausea or vomiting. 30 tablet 1   prochlorperazine (COMPAZINE) 10 MG tablet Take 1 tablet (10 mg total) by mouth every 6 (six) hours as needed for nausea or vomiting. 30 tablet 1   rosuvastatin (CRESTOR) 40 MG tablet Take 1 tablet (40 mg total) by mouth daily. 90 tablet 1   traMADol (ULTRAM) 50 MG tablet Take 50 mg by mouth every 6 (six) hours as needed.     No current facility-administered medications for this visit.    PHYSICAL EXAMINATION:  ECOG PERFORMANCE STATUS: 1 - Symptomatic but completely ambulatory   Vitals:   05/29/23 0952  BP: 123/76  Pulse: 81  Resp: 20  Temp: 97.6 F (36.4 C)  SpO2: 100%     Filed Weights   05/29/23 0952  Weight: 181 lb 6.4 oz (82.3 kg)      Physical Exam Vitals and nursing note reviewed.  Constitutional:      General: He is not in acute distress.    Appearance: Normal appearance. He is not ill-appearing or diaphoretic.     Comments: Here alone   HENT:     Head: Normocephalic and atraumatic.     Right Ear: External ear normal.     Left Ear: External ear normal.     Nose: Nose normal.  Eyes:     Conjunctiva/sclera: Conjunctivae normal.  Pupils: Pupils are equal, round, and reactive to light.  Cardiovascular:     Rate and Rhythm: Normal rate and regular rhythm.     Heart sounds:     No friction rub. No gallop.  Pulmonary:     Effort: Pulmonary effort is normal. No respiratory distress.     Breath sounds: Normal breath sounds. No stridor.  Abdominal:     General: Abdomen is flat. There is no distension.     Palpations: Abdomen is soft. There is no hepatomegaly, splenomegaly or mass.     Tenderness: There is no abdominal tenderness.  Musculoskeletal:        General: No swelling. Normal range of motion.     Cervical back: Normal range of motion and neck supple. No rigidity or tenderness.     Right lower leg: No edema.     Left lower leg: No edema.  Lymphadenopathy:     Head:     Right side of head: No submental, submandibular, tonsillar, preauricular, posterior auricular or occipital adenopathy.     Left side of head: No submental, submandibular, tonsillar, preauricular, posterior auricular or occipital adenopathy.     Cervical: No cervical adenopathy.     Upper Body:     Right upper body: No supraclavicular or axillary adenopathy.     Left upper body: No supraclavicular or axillary adenopathy.     Lower Body: No right inguinal adenopathy. No left inguinal adenopathy.  Skin:    Coloration: Skin is not jaundiced.     Findings: No bruising or erythema.     Comments: Fitzpatrick 2 skin tone.  Significant solar damage to face and arms.    Neurological:     General: No focal deficit present.     Mental Status: He is alert and oriented to person, place, and time.     Comments: Hard of hearing  Psychiatric:        Mood and Affect: Mood normal.        Behavior: Behavior normal.        Thought Content: Thought content  normal.        Judgment: Judgment normal.     LABORATORY DATA: I have personally reviewed the data as listed:  Appointment on 05/29/2023  Component Date Value Ref Range Status   Sodium 05/29/2023 137  135 - 145 mmol/L Final   Potassium 05/29/2023 4.3  3.5 - 5.1 mmol/L Final   Chloride 05/29/2023 102  98 - 111 mmol/L Final   CO2 05/29/2023 24  22 - 32 mmol/L Final   Glucose, Bld 05/29/2023 283 (H)  70 - 99 mg/dL Final   Glucose reference range applies only to samples taken after fasting for at least 8 hours.   BUN 05/29/2023 12  8 - 23 mg/dL Final   Creatinine, Ser 05/29/2023 0.78  0.61 - 1.24 mg/dL Final   Calcium 96/03/5408 8.7 (L)  8.9 - 10.3 mg/dL Final   Total Protein 81/19/1478 6.9  6.5 - 8.1 g/dL Final   Albumin 29/56/2130 3.2 (L)  3.5 - 5.0 g/dL Final   AST 86/57/8469 19  15 - 41 U/L Final   ALT 05/29/2023 19  0 - 44 U/L Final   Alkaline Phosphatase 05/29/2023 104  38 - 126 U/L Final   Total Bilirubin 05/29/2023 0.7  0.3 - 1.2 mg/dL Final   GFR, Estimated 05/29/2023 >60  >60 mL/min Final   Comment: (NOTE) Calculated using the CKD-EPI Creatinine Equation (2021)    Anion gap 05/29/2023 11  5 - 15 Final   Performed at [facility], [address] [place], [place] [address]   WBC 05/29/2023 12.1 (H)  4.0 - 10.5 K/uL Final   RBC 05/29/2023 4.82  4.22 - 5.81 MIL/uL Final   Hemoglobin 05/29/2023 14.0  13.0 - 17.0 g/dL Final   HCT 44/12/270 42.9  39.0 - 52.0 % Final   MCV 05/29/2023 89.0  80.0 - 100.0 fL Final   MCH 05/29/2023 29.0  26.0 - 34.0 pg Final   MCHC 05/29/2023 32.6  30.0 - 36.0 g/dL Final   RDW 53/66/4403 13.1  11.5 - 15.5 % Final   Platelets 05/29/2023 390  150 - 400 K/uL Final   nRBC 05/29/2023 0.0  0.0 - 0.2 % Final   Neutrophils Relative % 05/29/2023 63  % Final   Neutro Abs 05/29/2023 7.7  1.7 - 7.7 K/uL Final   Lymphocytes Relative 05/29/2023 23  % Final   Lymphs Abs 05/29/2023 2.7  0.7 - 4.0 K/uL Final   Monocytes Relative 05/29/2023  8  % Final   Monocytes Absolute 05/29/2023 1.0  0.1 - 1.0 K/uL Final   Eosinophils Relative 05/29/2023 4  % Final   Eosinophils Absolute 05/29/2023 0.4  0.0 - 0.5 K/uL Final   Basophils Relative 05/29/2023 1  % Final   Basophils Absolute 05/29/2023 0.1  0.0 - 0.1 K/uL Final   Immature Granulocytes 05/29/2023 1  % Final   Abs Immature Granulocytes 05/29/2023 0.12 (H)  0.00 - 0.07 K/uL Final   Performed at [facility], [address] [place], [place] [address]  Appointment on 05/06/2023  Component Date Value Ref Range Status   Free T4 05/06/2023 0.82  0.61 - 1.12 ng/dL Final   Comment: (NOTE) Biotin ingestion may interfere with free T4 tests. If the results are inconsistent with the TSH level, previous test results, or the clinical presentation, then consider biotin interference. If needed, order repeat testing after stopping biotin. Performed at [facility], [address], [place], [place] [address]    TSH 05/06/2023 1.876  0.350 - 4.500 uIU/mL Final   Comment: Performed by a 3rd Generation assay with a functional sensitivity of <=0.01 uIU/mL. Performed at [facility], [address] [place], [place] [address]    Sodium 05/06/2023 135  135 - 145 mmol/L Final   Potassium 05/06/2023 4.4  3.5 - 5.1 mmol/L Final   Chloride 05/06/2023 103  98 - 111 mmol/L Final   CO2 05/06/2023 25  22 - 32 mmol/L Final   Glucose, Bld 05/06/2023 200 (H)  70 - 99 mg/dL Final   Glucose reference range applies only to samples taken after fasting for at least 8 hours.   BUN 05/06/2023 12  8 - 23 mg/dL Final   Creatinine, Ser 05/06/2023 0.55 (L)  0.61 - 1.24 mg/dL Final   Calcium 32/95/1884 8.9  8.9 - 10.3 mg/dL Final   Total Protein 16/60/6301 6.7  6.5 - 8.1 g/dL Final   Albumin 60/09/9322 3.5  3.5 - 5.0 g/dL Final   AST 55/73/2202 19  15 - 41 U/L Final   ALT 05/06/2023 18  0 - 44 U/L Final   Alkaline Phosphatase 05/06/2023 98  38 - 126 U/L Final   Total  Bilirubin 05/06/2023 0.7  0.3 - 1.2 mg/dL Final   GFR, Estimated 05/06/2023 >60  >60 mL/min Final   Comment: (NOTE) Calculated using the CKD-EPI Creatinine Equation (2021)    Anion gap 05/06/2023 7  5 - 15 Final   Performed at [facility], [address] [place], [place] [address]   WBC 05/06/2023 8.9  4.0 - 10.5 K/uL Final   RBC 05/06/2023 4.89  4.22 - 5.81 MIL/uL Final   Hemoglobin 05/06/2023 14.2  13.0 - 17.0 g/dL Final   HCT 60/45/4098 43.3  39.0 - 52.0 % Final   MCV 05/06/2023 88.5  80.0 - 100.0 fL Final   MCH 05/06/2023 29.0  26.0 - 34.0 pg Final   MCHC 05/06/2023 32.8  30.0 - 36.0 g/dL Final   RDW 11/91/4782 13.3  11.5 - 15.5 % Final   Platelets 05/06/2023 353  150 - 400 K/uL Final   nRBC 05/06/2023 0.0  0.0 - 0.2 % Final   Neutrophils Relative % 05/06/2023 47  % Final   Neutro Abs 05/06/2023 4.2  1.7 - 7.7 K/uL Final   Lymphocytes Relative 05/06/2023 37  % Final   Lymphs Abs 05/06/2023 3.3  0.7 - 4.0 K/uL Final   Monocytes Relative 05/06/2023 11  % Final   Monocytes Absolute 05/06/2023 1.0  0.1 - 1.0 K/uL Final   Eosinophils Relative 05/06/2023 3  % Final   Eosinophils Absolute 05/06/2023 0.3  0.0 - 0.5 K/uL Final   Basophils Relative 05/06/2023 1  % Final   Basophils Absolute 05/06/2023 0.1  0.0 - 0.1 K/uL Final   Immature Granulocytes 05/06/2023 1  % Final   Abs Immature Granulocytes 05/06/2023 0.05  0.00 - 0.07 K/uL Final   Performed at [facility], [address] [place], [place] [address]  Office Visit on 05/06/2023  Component Date Value Ref Range Status   Prostatic Specific Antigen 05/06/2023 7.11 (H)  0.00 - 4.00 ng/mL Final   Comment: (NOTE) While PSA levels of <=4.00 ng/ml are reported as reference range, some men with levels below 4.00 ng/ml can have prostate cancer and many men with PSA above 4.00 ng/ml do not have prostate cancer.  Other tests such as free PSA, age specific reference ranges, PSA velocity and  PSA doubling time may be helpful especially in men less than 46 years old. Performed at [facility], [address] [place], [place] [address]   Office Visit on 04/29/2023  Component Date Value Ref Range Status   WBC 04/29/2023 9.6  4.0 - 10.5 K/uL Final   RBC 04/29/2023 4.75  4.22 - 5.81 MIL/uL Final   Hemoglobin 04/29/2023 13.9  13.0 - 17.0 g/dL Final   HCT 78/46/9629 41.8  39.0 - 52.0 % Final   MCV 04/29/2023 88.0  80.0 - 100.0 fL Final   MCH 04/29/2023 29.3  26.0 - 34.0 pg Final   MCHC 04/29/2023 33.3  30.0 - 36.0 g/dL Final   RDW 52/84/1324 13.1  11.5 - 15.5 % Final   Platelets 04/29/2023 360  150 - 400 K/uL Final   nRBC 04/29/2023 0.0  0.0 - 0.2 % Final   Neutrophils Relative % 04/29/2023 50  % Final   Neutro Abs 04/29/2023 4.8  1.7 - 7.7 K/uL Final   Lymphocytes Relative 04/29/2023 34  % Final   Lymphs Abs 04/29/2023 3.3  0.7 - 4.0 K/uL Final   Monocytes Relative 04/29/2023 11  % Final   Monocytes Absolute 04/29/2023 1.1 (H)  0.1 - 1.0 K/uL Final   Eosinophils Relative 04/29/2023 3  % Final   Eosinophils Absolute 04/29/2023 0.3  0.0 - 0.5 K/uL Final   Basophils Relative 04/29/2023 0  % Final   Basophils  Absolute 04/29/2023 0.0  0.0 - 0.1 K/uL Final   Immature Granulocytes 04/29/2023 2  % Final   Abs Immature Granulocytes 04/29/2023 0.15 (H)  0.00 - 0.07 K/uL Final   Performed at [facility], [address], [place], [place] [address]   Sodium 04/29/2023 135  135 - 145 mmol/L Final   Potassium 04/29/2023 3.9  3.5 - 5.1 mmol/L Final   Chloride 04/29/2023 103  98 - 111 mmol/L Final   CO2 04/29/2023 24  22 - 32 mmol/L Final   Glucose, Bld 04/29/2023 137 (H)  70 - 99 mg/dL Final   Glucose reference range applies only to samples taken after fasting for at least 8 hours.   BUN 04/29/2023 12  8 - 23 mg/dL Final   Creatinine, Ser 04/29/2023 0.57 (L)  0.61 - 1.24 mg/dL Final   Calcium 64/33/2951 8.6 (L)  8.9 - 10.3 mg/dL Final   Total  Protein 04/29/2023 6.5  6.5 - 8.1 g/dL Final   Albumin 88/41/6606 3.3 (L)  3.5 - 5.0 g/dL Final   AST 30/16/0109 18  15 - 41 U/L Final   ALT 04/29/2023 17  0 - 44 U/L Final   Alkaline Phosphatase 04/29/2023 93  38 - 126 U/L Final   Total Bilirubin 04/29/2023 0.5  0.3 - 1.2 mg/dL Final   GFR, Estimated 04/29/2023 >60  >60 mL/min Final   Comment: (NOTE) Calculated using the CKD-EPI Creatinine Equation (2021)    Anion gap 04/29/2023 8  5 - 15 Final   Performed at [facility], [address] [place], [place] [address]   Cortisol, Plasma 04/29/2023 8.4  ug/dL Final   Comment: (NOTE) AM    6.7 - 22.6 ug/dL PM   <73.2       ug/dL Performed at [facility], [address] [place], [place] [address]    TSH 04/29/2023 2.636  0.350 - 4.500 uIU/mL Final   Comment: Performed by a 3rd Generation assay with a functional sensitivity of <=0.01 uIU/mL. Performed at [facility], [address] [place], [place] [address]    Free T4 04/29/2023 0.76  0.61 - 1.12 ng/dL Final   Comment: (NOTE) Biotin ingestion may interfere with free T4 tests. If the results are inconsistent with the TSH level, previous test results, or the clinical presentation, then consider biotin interference. If needed, order repeat testing after stopping biotin. Performed at [facility], [address] [place], [place] [address]     RADIOGRAPHIC STUDIES: I have personally reviewed the radiological images as listed and agree with the findings in the report  No results found.  ASSESSMENT/PLAN 77 y.o. male is here because of  malignant melanoma.  Medical history notable for diabetes mellitus, coronary artery disease, cerebrovascular disease, MI, cataracts  Malignant melanoma, distal left leg, Stage IIC (T4b N0 M0)- Risk factors for this patient are 1) chronic exposure to UV 2) Sunburns 3) Fitzpatrick Grade 2 skin tone March 19, 2023: Shave biopsy left lateral leg  demonstrated malignant melanoma, nodular type. Breslow depth 5.1 mm. Clark's level 4. Ulceration present. Mitotic index 16/mm. Pathologic stage T4b. Deep margin involved  April 01 2023- Wide local excision.  Sentinel LN bx not performed due to patient frailty  Therapeutics   Apr 03 2023- Will obtain CBC with diff, CMP, LDH, MRI brain and whole body PET/CT for staging  Patient meets criteria per NCCN Guidelines for adjuvant immunotherapy and will plan on adjuvant Pembrolizumab  May 06 2023- Chemotherapy teaching  May 13, 2023-cycle 1 pembrolizumab  June 03, 2023-cycle 2 pembrolizumab  Immunotherapy Risks:   May 06 2023- Reiterated potential side effects of immunotherapy with the patient.  These include but are not limited to:  Fatigue, Hair loss, low blood counts (anemia, thrombocytopenia) that may necessitate transfusion, bleeding, infection, nausea/ vomiting/Appetite changes/ Constipation/ Diarrhea, mucositis, Neuropathy/ neurologic problems, skin and nail changes such as dry skin and color change, Urine and bladder changes and kidney problems, weight changes, mood changes, decreased libido/fertility problems, damage to heart and lungs.  Some of these side effects can be life threatening, may be permanent and can result in hospitalization and/or death.  In shared decision making patient has agreed to proceed with immunotherapy.    Poor IV access  Apr 29 2023- Has undergone portacath placement  Urinary frequency  May 06 2023- Likely secondary to BPH.  Will check U/A with microscopic, PSA and begin Proscar.    May 29, 2023-PSA 7.11 on June 4. Will need eventual referral to urology  Diabetes/hyperglycemia  May 29, 2023-  Patient instructed to see PCP     Cancer Staging  Malignant melanoma [facility] Staging form: Melanoma of the Skin, AJCC 8th Edition - Clinical stage from 04/03/2023: Stage IIC (cT4b, cN0, cM0) - Signed by Loni Muse, MD on 04/03/2023 Histopathologic type: Nodular  melanoma Stage prefix: Initial diagnosis    No problem-specific Assessment & Plan notes found for this encounter.    No orders of the defined types were placed in this encounter.   30 minutes was spent in patient care.  This included time spent preparing to see the patient (e.g., review of tests), obtaining and/or reviewing separately obtained history, counseling and educating the patient/family/caregiver, ordering medications, tests, or procedures; documenting clinical information in the electronic or other health record, independently interpreting results and communicating results to the patient/family/caregiver as well as coordination of care.       All questions were answered. The patient knows to call the clinic with any problems, questions or concerns.  This note was electronically signed.    Adah Perl, PA-C  05/29/2023 1:19 PM

## 2023-05-29 NOTE — Telephone Encounter (Signed)
-----   Message from Adah Perl, PA-C sent at 05/29/2023  1:26 PM EDT ----- Please let him know his sugar was 283, so he needs to see PCP. Thanks

## 2023-05-30 ENCOUNTER — Encounter: Payer: Self-pay | Admitting: Oncology

## 2023-06-03 ENCOUNTER — Inpatient Hospital Stay: Payer: Medicare HMO | Attending: Oncology

## 2023-06-03 VITALS — BP 118/68 | HR 89 | Temp 97.7°F | Resp 18 | Wt 182.0 lb

## 2023-06-03 DIAGNOSIS — Z5111 Encounter for antineoplastic chemotherapy: Secondary | ICD-10-CM | POA: Insufficient documentation

## 2023-06-03 DIAGNOSIS — Z79899 Other long term (current) drug therapy: Secondary | ICD-10-CM | POA: Insufficient documentation

## 2023-06-03 DIAGNOSIS — C4372 Malignant melanoma of left lower limb, including hip: Secondary | ICD-10-CM | POA: Diagnosis not present

## 2023-06-03 MED ORDER — SODIUM CHLORIDE 0.9 % IV SOLN: Freq: Once | INTRAVENOUS | Status: AC

## 2023-06-03 MED ORDER — SODIUM CHLORIDE 0.9 % IV SOLN
200.0000 mg | Freq: Once | INTRAVENOUS | Status: AC
  Administered 2023-06-03: 200 mg via INTRAVENOUS
  Filled 2023-06-03: qty 8

## 2023-06-03 MED ORDER — SODIUM CHLORIDE 0.9% FLUSH
10.0000 mL | INTRAVENOUS | Status: DC | PRN
  Administered 2023-06-03: 10 mL

## 2023-06-03 MED ORDER — HEPARIN SOD (PORK) LOCK FLUSH 100 UNIT/ML IV SOLN
500.0000 [IU] | Freq: Once | INTRAVENOUS | Status: AC | PRN
  Administered 2023-06-03: 500 [IU]

## 2023-06-03 NOTE — Patient Instructions (Signed)
[facility]  Discharge Instructions: Thank you for choosing [facility] to provide your oncology and hematology care.  If you have a lab appointment with the Cancer Center, please go directly to the Cancer Center and check in at the registration area.   Wear comfortable clothing and clothing appropriate for easy access to any Portacath or PICC line.   We strive to give you quality time with your provider. You may need to reschedule your appointment if you arrive late (15 or more minutes).  Arriving late affects you and other patients whose appointments are after yours.  Also, if you miss three or more appointments without notifying the office, you may be dismissed from the clinic at the provider's discretion.      For prescription refill requests, have your pharmacy contact our office and allow 72 hours for refills to be completed.    Today you received the following chemotherapy and/or immunotherapy agents keytruda   To help prevent nausea and vomiting after your treatment, we encourage you to take your nausea medication as directed.  BELOW ARE SYMPTOMS THAT SHOULD BE REPORTED IMMEDIATELY: *FEVER GREATER THAN 100.4 F (38 C) OR HIGHER *CHILLS OR SWEATING *NAUSEA AND VOMITING THAT IS NOT CONTROLLED WITH YOUR NAUSEA MEDICATION *UNUSUAL SHORTNESS OF BREATH *UNUSUAL BRUISING OR BLEEDING *URINARY PROBLEMS (pain or burning when urinating, or frequent urination) *BOWEL PROBLEMS (unusual diarrhea, constipation, pain near the anus) TENDERNESS IN MOUTH AND THROAT WITH OR WITHOUT PRESENCE OF ULCERS (sore throat, sores in mouth, or a toothache) UNUSUAL RASH, SWELLING OR PAIN  UNUSUAL VAGINAL DISCHARGE OR ITCHING   Items with * indicate a potential emergency and should be followed up as soon as possible or go to the Emergency Department if any problems should occur.  Please show the CHEMOTHERAPY ALERT CARD or IMMUNOTHERAPY ALERT CARD at check-in to the  Emergency Department and triage nurse.  Should you have questions after your visit or need to cancel or reschedule your appointment, please contact [facility]  Dept: [phone]  and follow the prompts.  Office hours are 8:00 a.m. to 4:30 p.m. Monday - Friday. Please note that voicemails left after 4:00 p.m. may not be returned until the following business day.  We are closed weekends and major holidays. You have access to a nurse at all times for urgent questions. Please call the main number to the clinic Dept: [phone] and follow the prompts.  For any non-urgent questions, you may also contact your provider using MyChart. We now offer e-Visits for anyone 18 and older to request care online for non-urgent symptoms. For details visit [email or web address].   Also download the MyChart app! Go to the app store, search "MyChart", open the app, select [facility], and log in with your MyChart username and password.   

## 2023-06-10 DIAGNOSIS — E782 Mixed hyperlipidemia: Secondary | ICD-10-CM | POA: Diagnosis not present

## 2023-06-10 DIAGNOSIS — I252 Old myocardial infarction: Secondary | ICD-10-CM | POA: Diagnosis not present

## 2023-06-10 DIAGNOSIS — R4189 Other symptoms and signs involving cognitive functions and awareness: Secondary | ICD-10-CM | POA: Diagnosis not present

## 2023-06-10 DIAGNOSIS — E1159 Type 2 diabetes mellitus with other circulatory complications: Secondary | ICD-10-CM | POA: Diagnosis not present

## 2023-06-10 DIAGNOSIS — E538 Deficiency of other specified B group vitamins: Secondary | ICD-10-CM | POA: Diagnosis not present

## 2023-06-10 DIAGNOSIS — I69331 Monoplegia of upper limb following cerebral infarction affecting right dominant side: Secondary | ICD-10-CM | POA: Diagnosis not present

## 2023-06-10 DIAGNOSIS — C4372 Malignant melanoma of left lower limb, including hip: Secondary | ICD-10-CM | POA: Diagnosis not present

## 2023-06-10 DIAGNOSIS — I6523 Occlusion and stenosis of bilateral carotid arteries: Secondary | ICD-10-CM | POA: Diagnosis not present

## 2023-06-10 DIAGNOSIS — I2510 Atherosclerotic heart disease of native coronary artery without angina pectoris: Secondary | ICD-10-CM | POA: Diagnosis not present

## 2023-06-10 DIAGNOSIS — I6990 Unspecified sequelae of unspecified cerebrovascular disease: Secondary | ICD-10-CM | POA: Diagnosis not present

## 2023-06-17 ENCOUNTER — Other Ambulatory Visit: Payer: Self-pay | Admitting: Oncology

## 2023-06-19 ENCOUNTER — Inpatient Hospital Stay: Payer: Medicare HMO

## 2023-06-19 ENCOUNTER — Inpatient Hospital Stay: Payer: Medicare HMO | Admitting: Oncology

## 2023-06-19 DIAGNOSIS — E538 Deficiency of other specified B group vitamins: Secondary | ICD-10-CM | POA: Diagnosis not present

## 2023-06-19 DIAGNOSIS — Z5111 Encounter for antineoplastic chemotherapy: Secondary | ICD-10-CM | POA: Diagnosis not present

## 2023-06-19 DIAGNOSIS — C4372 Malignant melanoma of left lower limb, including hip: Secondary | ICD-10-CM | POA: Diagnosis not present

## 2023-06-19 DIAGNOSIS — Z79899 Other long term (current) drug therapy: Secondary | ICD-10-CM | POA: Diagnosis not present

## 2023-06-19 LAB — CMP (CANCER CENTER ONLY)
ALT: 19 U/L (ref 0–44)
AST: 21 U/L (ref 15–41)
Albumin: 3.5 g/dL (ref 3.5–5.0)
Alkaline Phosphatase: 82 U/L (ref 38–126)
Anion gap: 10 (ref 5–15)
BUN: 15 mg/dL (ref 8–23)
CO2: 22 mmol/L (ref 22–32)
Calcium: 8.6 mg/dL — ABNORMAL LOW (ref 8.9–10.3)
Chloride: 104 mmol/L (ref 98–111)
Creatinine: 0.8 mg/dL (ref 0.61–1.24)
GFR, Estimated: >60 mL/min (ref >60)
Glucose, Bld: 276 mg/dL — ABNORMAL HIGH (ref 70–99)
Potassium: 4 mmol/L (ref 3.5–5.1)
Sodium: 136 mmol/L (ref 135–145)
Total Bilirubin: 0.8 mg/dL (ref 0.3–1.2)
Total Protein: 6.6 g/dL (ref 6.5–8.1)

## 2023-06-19 LAB — CBC WITH DIFFERENTIAL (CANCER CENTER ONLY)
Abs Immature Granulocytes: 0.07 10*3/uL (ref 0.00–0.07)
Basophils Absolute: 0 10*3/uL (ref 0.0–0.1)
Basophils Relative: 0 %
Eosinophils Absolute: 0.4 10*3/uL (ref 0.0–0.5)
Eosinophils Relative: 4 %
HCT: 41.5 % (ref 39.0–52.0)
Hemoglobin: 13.8 g/dL (ref 13.0–17.0)
Immature Granulocytes: 1 %
Lymphocytes Relative: 29 %
Lymphs Abs: 3.1 10*3/uL (ref 0.7–4.0)
MCH: 29.6 pg (ref 26.0–34.0)
MCHC: 33.3 g/dL (ref 30.0–36.0)
MCV: 89.1 fL (ref 80.0–100.0)
Monocytes Absolute: 1.1 10*3/uL — ABNORMAL HIGH (ref 0.1–1.0)
Monocytes Relative: 10 %
Neutro Abs: 5.9 10*3/uL (ref 1.7–7.7)
Neutrophils Relative %: 56 %
Platelet Count: 318 10*3/uL (ref 150–400)
RBC: 4.66 MIL/uL (ref 4.22–5.81)
RDW: 13.5 % (ref 11.5–15.5)
WBC Count: 10.6 10*3/uL — ABNORMAL HIGH (ref 4.0–10.5)
nRBC: 0 % (ref 0.0–0.2)

## 2023-06-19 LAB — TSH: TSH: 0.131 u[IU]/mL — ABNORMAL LOW (ref 0.350–4.500)

## 2023-06-19 NOTE — Progress Notes (Deleted)
[facility] Initial Visit:  Patient Care Team: Gus Height, [place] as PCP - General (Family Medicine) [name], MD as PCP - Cardiology (Cardiology) Jenene Slicker Deberah Castle, MD as Consulting Physician (Optometry) Olegario Shearer, MD as Referring Physician (Dermatology) Loni Muse, MD as Consulting Physician (Internal Medicine)  CHIEF COMPLAINTS/PURPOSE OF CONSULTATION:  Oncology History  Malignant melanoma (HCC)  04/03/2023 Initial Diagnosis   Malignant melanoma (HCC)   04/03/2023 Cancer Staging   Staging form: Melanoma of the Skin, AJCC 8th Edition - Clinical stage from 04/03/2023: Stage IIC (cT4b, cN0, cM0) - Signed by Loni Muse, MD on 04/03/2023 Histopathologic type: Nodular melanoma Stage prefix: Initial diagnosis   05/13/2023 -  Chemotherapy   Patient is on Treatment Plan : MELANOMA Pembrolizumab (200) q21d       HISTORY OF PRESENTING ILLNESS: Jon Nelson 77 y.o. 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  Breslow depth 5.1 mm.  Clark's level 4.  Ulceration present.  Mitotic index 16/mm.  Pathologic stage T4b.  Deep margin involved  Apr 03 2023:  [facility] Medical Oncology Consult Patient here with sister in law [name] who is also his power of attorney.  He has had a mole on the left lateral leg for years but in the last year it degenerated into a sore which didn't heal which prompted him to see a dermatologist.  He has been followed by dermatology for removal of non-melanomatous skin cancers on his face and arms over the years.  He underwent a shave biopsy of the left leg lesion on March 19 2023.  He has not been noted to have in transit lesions.  April 01 2023 he underwent a wide local excision of the region but did not undergo  inguinal lymphadenectomy.     Social:  Widowed.  Lives with his dog.  Worked for [employer] where he worked outside.  No smoking.  EtOH none  [facility] Mother died 96 dementia Father estranged No siblings.     Apr 12 2023:  MRI brain No evidence of metastatic disease. 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  Apr 14 2023:  Whole body PET/CT Postsurgical changes in the lateral left lower leg, as above.  No findings suspicious for metastatic disease.   Apr 29 2023:   Has undergone port placement since last visit.  Has not yet been scheduled for teaching or therapy.  Discussed risks and benefits of immunotherapy    May 06 2023:  Scheduled follow up for melanoma.  Experiencing constipation.  Has urinary frequency but not interested in seeing a urologist.  To undergo chemotherapy teaching today.  Will check PSA and place on   May 13 2023:  Cycle 1 Pembrolizumab  Review of Systems  Constitutional:  Negative for appetite change, chills, fatigue, fever and unexpected weight change.  HENT:   Positive for hearing loss. Negative for lump/mass, mouth sores, nosebleeds, sore throat and trouble swallowing.        Deaf left ear  Eyes:  Positive for eye problems. Negative for icterus.       Vision changes:  None  Respiratory:  Negative for chest tightness, cough, hemoptysis and wheezing.        DOE on walking up hill  Cardiovascular:  Positive for leg swelling.  Negative for chest pain and palpitations.       PND:  none Orthopnea:  none  Gastrointestinal:  Negative for abdominal distention, abdominal pain, blood in stool, constipation, diarrhea, nausea and vomiting.  Endocrine: Negative for hot flashes.       Cold intolerance:  none Heat intolerance:  none  Genitourinary:  Positive for frequency. Negative for bladder incontinence, difficulty urinating, dysuria and hematuria.        Nocturia x 3 to 4  Musculoskeletal:  Negative for arthralgias, back pain, gait  problem, myalgias, neck pain and neck stiffness.  Skin:  Negative for rash.       Chronic pruritus of arms.  Healing from surgery to left leg  Neurological:  Negative for extremity weakness, gait problem, headaches, numbness and speech difficulty.       Occasional dizziness particularly when bending over.  No falls   Hematological:  Negative for adenopathy. Does not bruise/bleed easily.  Psychiatric/Behavioral:  Negative for sleep disturbance and suicidal ideas. The patient is not nervous/anxious.     MEDICAL HISTORY: Past Medical History:  Diagnosis Date  . Carotid artery stenosis   . Coronary artery stenosis   . Diabetes (HCC)   . Diabetes mellitus with cardiac complication (HCC)   . GI bleed   . HLD (hyperlipidemia)   . HTN (hypertension)   . Mixed hyperlipidemia   . 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 Old myocardial infarct   . Other amnesia   . TIA (transient ischemic attack)     SURGICAL HISTORY: Past Surgical History:  Procedure Laterality Date  . CATARACT EXTRACTION Left 08/15/2020  . CORONARY ARTERY BYPASS GRAFT N/A 09/24/2021   Procedure: CORONARY ARTERY BYPASS GRAFTING (CABG) TIMES THREE , ON PUMP, USING LEFT INTERNAL MAMMARY ARTERY AND ENDOSCOPICALLY HARVESTED RIGHT GREATER SAPHENOUS VEIN;  Surgeon: Corliss Skains, MD;  Location: [facility] OR;  Service: Open Heart Surgery;  Laterality: N/A;  . ENDOVEIN HARVEST OF GREATER SAPHENOUS VEIN Right 09/24/2021   Procedure: ENDOVEIN HARVEST OF GREATER SAPHENOUS VEIN;  Surgeon: Corliss Skains, MD;  Location: [facility] OR;  Service: Open Heart Surgery;  Laterality: Right;  . HEMORRHOID SURGERY    . HERNIA REPAIR  2021  . LEFT HEART CATH AND CORONARY ANGIOGRAPHY N/A 09/18/2021   Procedure: LEFT HEART CATH AND CORONARY ANGIOGRAPHY;  Surgeon: Lyn Records, MD;  Location: [facility] INVASIVE CV LAB;  Service: Cardiovascular;  Laterality: N/A;  . LOOP RECORDER INSERTION N/A  02/03/2019   Procedure: LOOP RECORDER INSERTION;  Surgeon: Regan Lemming, MD;  Location: [facility] INVASIVE CV LAB;  Service: Cardiovascular;  Laterality: N/A;  . MOLE REMOVAL    . PORTA CATH INSERTION    . SKIN CANCER EXCISION    . TEE WITHOUT CARDIOVERSION N/A 02/03/2019   Procedure: TRANSESOPHAGEAL ECHOCARDIOGRAM (TEE);  Surgeon: Jake Bathe, MD;  Location: [facility] ENDOSCOPY;  Service: Cardiovascular;  Laterality: N/A;  loop  . TEE WITHOUT CARDIOVERSION N/A 09/24/2021   Procedure: TRANSESOPHAGEAL ECHOCARDIOGRAM (TEE);  Surgeon: Corliss Skains, MD;  Location: [facility] OR;  Service: Open Heart Surgery;  Laterality: N/A;    SOCIAL HISTORY: Social History   Socioeconomic History  . Marital status: Widowed    Spouse name: Not on file  . Number of children: 0  . Years of education: Not on file  . Highest education level: Not on file  Occupational History  . Occupation: Retired Brewing technologist)  Tobacco Use  . Smoking status: Never  .  Smokeless tobacco: Never  Vaping Use  . Vaping status: Never Used  Substance and Sexual Activity  . Alcohol use: Never  . Drug use: Never  . Sexual activity: Not Currently  Other Topics Concern  . Not on file  Social History Narrative   Patient's wife passed away in August 04, 2015 due to cancer - she was unable to have children.  Patient has a sister, his niece and her family lives in his home temporarily    Social Determinants of Health   Financial Resource Strain: Low Risk  (01/16/2022)   Overall Financial Resource Strain (CARDIA)   . Difficulty of Paying Living Expenses: Not hard at all  Food Insecurity: No Food Insecurity (04/03/2023)   Hunger Vital Sign   . Worried About Programme researcher, broadcasting/film/video in the Last Year: Never true   . Ran Out of Food in the Last Year: Never true  Transportation Needs: No Transportation Needs (01/16/2022)   PRAPARE - Transportation   . Lack of Transportation (Medical): No   . Lack of Transportation (Non-Medical): No  Physical Activity:  Not on file  Stress: Not on file  Social Connections: Not on file  Intimate Partner Violence: Not At Risk (04/03/2023)   Humiliation, Afraid, Rape, and Kick questionnaire   . Fear of Current or Ex-Partner: No   . Emotionally Abused: No   . Physically Abused: No   . Sexually Abused: No    FAMILY HISTORY Family History  Problem Relation Age of Onset  . Stroke Mother   . Alzheimer's disease Mother     ALLERGIES:  is allergic to aricept [donepezil].  MEDICATIONS:  Current Outpatient Medications  Medication Sig Dispense Refill  . acetaminophen (TYLENOL) 500 MG tablet Take 500 mg by mouth every 6 (six) hours as needed for mild pain or moderate pain.    Marland Kitchen aspirin EC 81 MG EC tablet Take 1 tablet (81 mg total) by mouth daily. Swallow whole.    . clopidogrel (PLAVIX) 75 MG tablet Take 1 tablet (75 mg total) by mouth daily. 30 tablet 1  . finasteride (PROSCAR) 5 MG tablet Take 1 tablet (5 mg total) by mouth daily. 30 tablet 5  . meloxicam (MOBIC) 15 MG tablet Take 15 mg by mouth daily.    . metFORMIN (GLUCOPHAGE) 500 MG tablet Take 1 tablet (500 mg total) by mouth every morning. 90 tablet 2  . metoprolol tartrate (LOPRESSOR) 25 MG tablet Take 0.5 tablets (12.5 mg total) by mouth 2 (two) times daily. 30 tablet 1  . ondansetron (ZOFRAN) 8 MG tablet Take 1 tablet (8 mg total) by mouth every 8 (eight) hours as needed for nausea or vomiting. 30 tablet 1  . prochlorperazine (COMPAZINE) 10 MG tablet Take 1 tablet (10 mg total) by mouth every 6 (six) hours as needed for nausea or vomiting. 30 tablet 1  . rosuvastatin (CRESTOR) 40 MG tablet Take 1 tablet (40 mg total) by mouth daily. 90 tablet 1  . traMADol (ULTRAM) 50 MG tablet Take 50 mg by mouth every 6 (six) hours as needed.     No current facility-administered medications for this visit.    PHYSICAL EXAMINATION:  ECOG PERFORMANCE STATUS: 1 - Symptomatic but completely ambulatory   There were no vitals filed for this visit.    There  were no vitals filed for this visit.     Physical Exam Vitals and nursing note reviewed.  Constitutional:      Appearance: Normal appearance. He is not diaphoretic.  Comments: Here alone  HENT:     Head: Normocephalic and atraumatic.     Right Ear: External ear normal.     Left Ear: External ear normal.     Nose: Nose normal.  Eyes:     Conjunctiva/sclera: Conjunctivae normal.     Pupils: Pupils are equal, round, and reactive to light.  Cardiovascular:     Rate and Rhythm: Normal rate and regular rhythm.     Heart sounds:     No friction rub. No gallop.  Pulmonary:     Effort: Pulmonary effort is normal. No respiratory distress.     Breath sounds: Normal breath sounds. No stridor.  Abdominal:     General: Abdomen is flat. There is no distension.     Palpations: Abdomen is soft.     Tenderness: There is no abdominal tenderness. There is no guarding.  Musculoskeletal:        General: No swelling. Normal range of motion.     Cervical back: Normal range of motion and neck supple. No rigidity or tenderness.     Right lower leg: Edema present.     Left lower leg: Edema present.  Lymphadenopathy:     Head:     Right side of head: No submental, submandibular, tonsillar, preauricular, posterior auricular or occipital adenopathy.     Left side of head: No submental, submandibular, tonsillar, preauricular, posterior auricular or occipital adenopathy.     Cervical: No cervical adenopathy.     Right cervical: No superficial, deep or posterior cervical adenopathy.    Left cervical: No superficial, deep or posterior cervical adenopathy.     Upper Body:     Right upper body: No supraclavicular or axillary adenopathy.     Left upper body: No supraclavicular or axillary adenopathy.     Lower Body: No right inguinal adenopathy. No left inguinal adenopathy.  Skin:    Coloration: Skin is not jaundiced.     Findings: No bruising or erythema.     Comments: Fitzpatrick 2 skin tone.   Significant solar damage to face and arms.   Left leg bandaged.    Neurological:     General: No focal deficit present.     Mental Status: He is alert and oriented to person, place, and time.     Comments: Hard of hearing  Psychiatric:        Mood and Affect: Mood normal.        Behavior: Behavior normal.        Thought Content: Thought content normal.        Judgment: Judgment normal.    LABORATORY DATA: I have personally reviewed the data as listed:  Appointment on 05/29/2023  Component Date Value Ref Range Status  . Sodium 05/29/2023 137  135 - 145 mmol/L Final  . Potassium 05/29/2023 4.3  3.5 - 5.1 mmol/L Final  . Chloride 05/29/2023 102  98 - 111 mmol/L Final  . CO2 05/29/2023 24  22 - 32 mmol/L Final  . Glucose, Bld 05/29/2023 283 (H)  70 - 99 mg/dL Final   Glucose reference range applies only to samples taken after fasting for at least 8 hours.  . BUN 05/29/2023 12  8 - 23 mg/dL Final  . Creatinine, Ser 05/29/2023 0.78  0.61 - 1.24 mg/dL Final  . Calcium 56/21/3086 8.7 (L)  8.9 - 10.3 mg/dL Final  . Total Protein 05/29/2023 6.9  6.5 - 8.1 g/dL Final  . Albumin 57/84/6962 3.2 (L)  3.5 -  5.0 g/dL Final  . AST 84/69/6295 19  15 - 41 U/L Final  . ALT 05/29/2023 19  0 - 44 U/L Final  . Alkaline Phosphatase 05/29/2023 104  38 - 126 U/L Final  . Total Bilirubin 05/29/2023 0.7  0.3 - 1.2 mg/dL Final  . GFR, Estimated 05/29/2023 >60  >60 mL/min Final   Comment: (NOTE) Calculated using the CKD-EPI Creatinine Equation (2021)   . Anion gap 05/29/2023 11  5 - 15 Final   Performed at [facility], [address], [place], [place] [address]  . WBC 05/29/2023 12.1 (H)  4.0 - 10.5 K/uL Final  . RBC 05/29/2023 4.82  4.22 - 5.81 MIL/uL Final  . Hemoglobin 05/29/2023 14.0  13.0 - 17.0 g/dL Final  . HCT 24/40/1027 42.9  39.0 - 52.0 % Final  . MCV 05/29/2023 89.0  80.0 - 100.0 fL Final  . MCH 05/29/2023 29.0  26.0 - 34.0 pg Final  . MCHC 05/29/2023 32.6  30.0 - 36.0  g/dL Final  . RDW 25/36/6440 13.1  11.5 - 15.5 % Final  . Platelets 05/29/2023 390  150 - 400 K/uL Final  . nRBC 05/29/2023 0.0  0.0 - 0.2 % Final  . Neutrophils Relative % 05/29/2023 63  % Final  . Neutro Abs 05/29/2023 7.7  1.7 - 7.7 K/uL Final  . Lymphocytes Relative 05/29/2023 23  % Final  . Lymphs Abs 05/29/2023 2.7  0.7 - 4.0 K/uL Final  . Monocytes Relative 05/29/2023 8  % Final  . Monocytes Absolute 05/29/2023 1.0  0.1 - 1.0 K/uL Final  . Eosinophils Relative 05/29/2023 4  % Final  . Eosinophils Absolute 05/29/2023 0.4  0.0 - 0.5 K/uL Final  . Basophils Relative 05/29/2023 1  % Final  . Basophils Absolute 05/29/2023 0.1  0.0 - 0.1 K/uL Final  . Immature Granulocytes 05/29/2023 1  % Final  . Abs Immature Granulocytes 05/29/2023 0.12 (H)  0.00 - 0.07 K/uL Final   Performed at [facility], [address], [place], [place] [address]    RADIOGRAPHIC STUDIES: I have personally reviewed the radiological images as listed and agree with the findings in the report  No results found.  ASSESSMENT/PLAN 77 y.o. male is here because of  malignant melanoma.  Medical history notable for diabetes mellitus, coronary artery disease, cerebrovascular disease, MI, cataracts  Malignant melanoma, distal left leg, Stage IIC (T4b N0 M0)- Risk factors for this patient are 1) chronic exposure to UV 2) Sunburns 3) Fitzpatrick Grade 2 skin tone March 19, 2023: Shave biopsy left lateral leg demonstrated malignant melanoma, nodular type. Breslow depth 5.1 mm. Clark's level 4. Ulceration present. Mitotic index 16/mm. Pathologic stage T4b. Deep margin involved  April 01 2023- Wide local excision.  Sentinel LN bx not performed due to patient frailty  Therapeutics   Apr 03 2023- Will obtain CBC with diff, CMP, LDH, MRI brain and whole body PET/CT for staging  Patient meets criteria per NCCN Guidelines for adjuvant immunotherapy and will plan on adjuvant Pembrolizumab  May 06 2023-  Chemotherapy teaching  Immunotherapy Risks:   May 06 2023- Reiterated potential side effects of immunotherapy with the patient.  These include but are not limited to:  Fatigue, Hair loss, low blood counts (anemia, thrombocytopenia) that may necessitate transfusion, bleeding, infection, nausea/ vomiting/Appetite changes/ Constipation/ Diarrhea, mucositis, Neuropathy/ neurologic problems, skin and nail changes such as dry skin and color change, Urine and bladder changes and kidney problems, weight changes, mood changes, decreased  libido/fertility problems, damage to heart and lungs.  Some of these side effects can be life threatening, may be permanent and can result in hospitalization and/or death.  In shared decision making patient has agreed to proceed with immunotherapy.    Poor IV access  Apr 29 2023- Has undergone portacath placement  Urinary frequency  May 06 2023- Likely secondary to BPH.  Will check U/A with microscopic, PSA and begin Proscar.       Cancer Staging  Malignant melanoma (HCC) Staging form: Melanoma of the Skin, AJCC 8th Edition - Clinical stage from 04/03/2023: Stage IIC (cT4b, cN0, cM0) - Signed by Loni Muse, MD on 04/03/2023 Histopathologic type: Nodular melanoma Stage prefix: Initial diagnosis    No problem-specific Assessment & Plan notes found for this encounter.    No orders of the defined types were placed in this encounter.   30 minutes was spent in patient care.  This included time spent preparing to see the patient (e.g., review of tests), obtaining and/or reviewing separately obtained history, counseling and educating the patient/family/caregiver, ordering medications, tests, or procedures; documenting clinical information in the electronic or other health record, independently interpreting results and communicating results to the patient/family/caregiver as well as coordination of care.       All questions were answered. The patient knows to call  the clinic with any problems, questions or concerns.  This note was electronically signed.    Loni Muse, MD  06/19/2023 8:59 AM

## 2023-06-21 LAB — T4: T4, Total: 11.3 ug/dL (ref 4.5–12.0)

## 2023-06-23 ENCOUNTER — Other Ambulatory Visit: Payer: Self-pay

## 2023-06-23 ENCOUNTER — Inpatient Hospital Stay: Payer: Medicare HMO | Admitting: Oncology

## 2023-06-23 MED FILL — Pembrolizumab IV Soln 100 MG/4ML (25 MG/ML): INTRAVENOUS | Qty: 8 | Status: AC

## 2023-06-24 ENCOUNTER — Inpatient Hospital Stay: Payer: Medicare HMO

## 2023-06-24 ENCOUNTER — Inpatient Hospital Stay (INDEPENDENT_AMBULATORY_CARE_PROVIDER_SITE_OTHER): Payer: Medicare HMO | Admitting: Oncology

## 2023-06-24 VITALS — BP 108/70 | HR 88 | Temp 98.0°F | Resp 18 | Ht 69.0 in | Wt 183.0 lb

## 2023-06-24 VITALS — BP 106/76 | HR 93 | Resp 16 | Ht 69.0 in | Wt 182.2 lb

## 2023-06-24 DIAGNOSIS — R350 Frequency of micturition: Secondary | ICD-10-CM | POA: Diagnosis not present

## 2023-06-24 DIAGNOSIS — Z5111 Encounter for antineoplastic chemotherapy: Secondary | ICD-10-CM | POA: Diagnosis not present

## 2023-06-24 DIAGNOSIS — C4372 Malignant melanoma of left lower limb, including hip: Secondary | ICD-10-CM

## 2023-06-24 DIAGNOSIS — Z09 Encounter for follow-up examination after completed treatment for conditions other than malignant neoplasm: Secondary | ICD-10-CM | POA: Diagnosis not present

## 2023-06-24 DIAGNOSIS — R972 Elevated prostate specific antigen [PSA]: Secondary | ICD-10-CM | POA: Insufficient documentation

## 2023-06-24 DIAGNOSIS — Z79899 Other long term (current) drug therapy: Secondary | ICD-10-CM | POA: Diagnosis not present

## 2023-06-24 LAB — COMPREHENSIVE METABOLIC PANEL
ALT: 18 U/L (ref 0–44)
AST: 19 U/L (ref 15–41)
Albumin: 3.6 g/dL (ref 3.5–5.0)
Alkaline Phosphatase: 77 U/L (ref 38–126)
Anion gap: 9 (ref 5–15)
BUN: 15 mg/dL (ref 8–23)
CO2: 23 mmol/L (ref 22–32)
Calcium: 9 mg/dL (ref 8.9–10.3)
Chloride: 105 mmol/L (ref 98–111)
Creatinine, Ser: 0.75 mg/dL (ref 0.61–1.24)
GFR, Estimated: >60 mL/min (ref >60)
Glucose, Bld: 147 mg/dL — ABNORMAL HIGH (ref 70–99)
Potassium: 4.2 mmol/L (ref 3.5–5.1)
Sodium: 137 mmol/L (ref 135–145)
Total Bilirubin: 0.6 mg/dL (ref 0.3–1.2)
Total Protein: 6.6 g/dL (ref 6.5–8.1)

## 2023-06-24 LAB — CBC WITH DIFFERENTIAL/PLATELET
Abs Immature Granulocytes: 0.05 10*3/uL (ref 0.00–0.07)
Basophils Absolute: 0 10*3/uL (ref 0.0–0.1)
Basophils Relative: 0 %
Eosinophils Absolute: 0.3 10*3/uL (ref 0.0–0.5)
Eosinophils Relative: 3 %
HCT: 44.1 % (ref 39.0–52.0)
Hemoglobin: 14.3 g/dL (ref 13.0–17.0)
Immature Granulocytes: 1 %
Lymphocytes Relative: 33 %
Lymphs Abs: 3.3 10*3/uL (ref 0.7–4.0)
MCH: 29.6 pg (ref 26.0–34.0)
MCHC: 32.4 g/dL (ref 30.0–36.0)
MCV: 91.3 fL (ref 80.0–100.0)
Monocytes Absolute: 1.2 10*3/uL — ABNORMAL HIGH (ref 0.1–1.0)
Monocytes Relative: 12 %
Neutro Abs: 5.1 10*3/uL (ref 1.7–7.7)
Neutrophils Relative %: 51 %
Platelets: 294 10*3/uL (ref 150–400)
RBC: 4.83 MIL/uL (ref 4.22–5.81)
RDW: 13.4 % (ref 11.5–15.5)
WBC: 10 10*3/uL (ref 4.0–10.5)
nRBC: 0 % (ref 0.0–0.2)

## 2023-06-24 LAB — TSH: TSH: 0.025 u[IU]/mL — ABNORMAL LOW (ref 0.350–4.500)

## 2023-06-24 LAB — T4, FREE: Free T4: 2.18 ng/dL — ABNORMAL HIGH (ref 0.61–1.12)

## 2023-06-24 LAB — CORTISOL: Cortisol, Plasma: 7.4 ug/dL

## 2023-06-24 MED ORDER — SODIUM CHLORIDE 0.9% FLUSH: 10.0000 mL | INTRAVENOUS | Status: DC | PRN

## 2023-06-24 MED ORDER — HEPARIN SOD (PORK) LOCK FLUSH 100 UNIT/ML IV SOLN: 500.0000 [IU] | Freq: Once | INTRAVENOUS | Status: DC | PRN

## 2023-06-24 MED ORDER — SODIUM CHLORIDE 0.9 % IV SOLN: Freq: Once | INTRAVENOUS | Status: AC

## 2023-06-24 MED ORDER — SODIUM CHLORIDE 0.9 % IV SOLN
200.0000 mg | Freq: Once | INTRAVENOUS | Status: AC
  Administered 2023-06-24: 200 mg via INTRAVENOUS
  Filled 2023-06-24: qty 8

## 2023-06-24 NOTE — Patient Instructions (Signed)

## 2023-06-24 NOTE — Progress Notes (Signed)
[facility] Cancer Initial Visit:  Patient Care Team: Gus Height, [place] as PCP - General (Family Medicine) [name], MD as PCP - Cardiology (Cardiology) Jenene Slicker Deberah Castle, MD as Consulting Physician (Optometry) Olegario Shearer, MD as Referring Physician (Dermatology) Loni Muse, MD as Consulting Physician (Internal Medicine)  CHIEF COMPLAINTS/PURPOSE OF CONSULTATION:  Oncology History  Malignant melanoma (HCC)  04/03/2023 Initial Diagnosis   Malignant melanoma (HCC)   04/03/2023 Cancer Staging   Staging form: Melanoma of the Skin, AJCC 8th Edition - Clinical stage from 04/03/2023: Stage IIC (cT4b, cN0, cM0) - Signed by Loni Muse, MD on 04/03/2023 Histopathologic type: Nodular melanoma Stage prefix: Initial diagnosis   05/13/2023 -  Chemotherapy   Patient is on Treatment Plan : MELANOMA Pembrolizumab (200) q21d       HISTORY OF PRESENTING ILLNESS: Jon Nelson 77 y.o. 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  Breslow depth 5.1 mm.  Clark's level 4.  Ulceration present.  Mitotic index 16/mm.  Pathologic stage T4b.  Deep margin involved  Apr 03 2023:  [facility] Medical Oncology Consult Patient here with sister in law Ms Jon Nelson who is also his power of attorney.  He has had a mole on the left lateral leg for years but in the last year it degenerated into a sore which didn't heal which prompted him to see a dermatologist.  He has been followed by dermatology for removal of non-melanomatous skin cancers on his face and arms over the years.  He underwent a shave biopsy of the left leg lesion on March 19 2023.  He has not been noted to have in transit lesions.  April 01 2023 he underwent a wide local excision of the region but did not undergo  inguinal lymphadenectomy.     Social:  Widowed.  Lives with his dog.  Worked for [employer] where he worked outside.  No smoking.  EtOH none  [facility] Mother died 45 dementia Father estranged No siblings.     Apr 12 2023:  MRI brain No evidence of metastatic disease. 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  Apr 14 2023:  Whole body PET/CT Postsurgical changes in the lateral left lower leg, as above.  No findings suspicious for metastatic disease.   Apr 29 2023:   Has undergone port placement since last visit.  Has not yet been scheduled for teaching or therapy.  Discussed risks and benefits of immunotherapy    May 06 2023:   Experiencing constipation.  Has urinary frequency but not interested in seeing a urologist.  To undergo chemotherapy teaching today.  Will check PSA  May 13 2023:  Cycle 1 Pembrolizumab  June 03 2023:  Cycle 2 Pembrolizumab  June 24 2023:  Scheduled follow up for melanoma.  LE edema improved.  Does not check FSBG's.  Sister could not come to visit today because she struck a deer while driving.    TSH 0.31 Free T4 11.3 CMP notable for glucose 276  Cycle 3 Pembrolizumab   Review of Systems  Constitutional:  Negative for appetite change, chills, fatigue, fever and unexpected weight change.  HENT:   Positive for hearing loss. Negative for lump/mass, mouth sores, nosebleeds, sore throat and trouble swallowing.        Deaf left  ear  Eyes:  Positive for eye problems. Negative for icterus.       Vision changes:  None  Respiratory:  Negative for chest tightness, cough, hemoptysis and wheezing.        DOE on walking up hill  Cardiovascular:  Positive for leg swelling. Negative for chest pain and palpitations.       PND:  none Orthopnea:  none  Gastrointestinal:  Negative for abdominal distention, abdominal pain, blood in stool, constipation, diarrhea, nausea and vomiting.  Endocrine: Negative for hot flashes.       Cold  intolerance:  none Heat intolerance:  none  Genitourinary:  Positive for frequency. Negative for bladder incontinence, difficulty urinating, dysuria and hematuria.        Nocturia x 3 to 4  Musculoskeletal:  Negative for arthralgias, back pain, gait problem, myalgias, neck pain and neck stiffness.  Skin:  Negative for rash.       Chronic pruritus of arms.  Healing from surgery to left leg  Neurological:  Negative for extremity weakness, gait problem, headaches, numbness and speech difficulty.       Occasional dizziness particularly when bending over.  No falls   Hematological:  Negative for adenopathy. Does not bruise/bleed easily.  Psychiatric/Behavioral:  Negative for sleep disturbance and suicidal ideas. The patient is not nervous/anxious.     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)    Old myocardial infarct    Other amnesia    TIA (transient ischemic attack)     SURGICAL HISTORY: Past Surgical History:  Procedure Laterality Date   CATARACT EXTRACTION Left 08/15/2020   CORONARY ARTERY BYPASS GRAFT N/A 09/24/2021   Procedure: CORONARY ARTERY BYPASS GRAFTING (CABG) TIMES THREE , ON PUMP, USING LEFT INTERNAL MAMMARY ARTERY AND ENDOSCOPICALLY HARVESTED RIGHT GREATER SAPHENOUS VEIN;  Surgeon: Corliss Skains, MD;  Location: [facility] OR;  Service: Open Heart Surgery;  Laterality: N/A;   ENDOVEIN HARVEST OF GREATER SAPHENOUS VEIN Right 09/24/2021   Procedure: ENDOVEIN HARVEST OF GREATER SAPHENOUS VEIN;  Surgeon: Corliss Skains, MD;  Location: [facility] OR;  Service: Open Heart Surgery;  Laterality: Right;   HEMORRHOID SURGERY     HERNIA REPAIR  2021   LEFT HEART CATH  AND CORONARY ANGIOGRAPHY N/A 09/18/2021   Procedure: LEFT HEART CATH AND CORONARY ANGIOGRAPHY;  Surgeon: Lyn Records, MD;  Location: [facility] INVASIVE CV LAB;  Service: Cardiovascular;  Laterality: N/A;   LOOP RECORDER INSERTION N/A 02/03/2019   Procedure: LOOP RECORDER INSERTION;  Surgeon: Regan Lemming, MD;  Location: [facility] INVASIVE CV LAB;  Service: Cardiovascular;  Laterality: N/A;   MOLE REMOVAL     PORTA CATH INSERTION     SKIN CANCER EXCISION     TEE WITHOUT CARDIOVERSION N/A 02/03/2019   Procedure: TRANSESOPHAGEAL ECHOCARDIOGRAM (TEE);  Surgeon: Jake Bathe, MD;  Location: [facility] ENDOSCOPY;  Service: Cardiovascular;  Laterality: N/A;  loop   TEE WITHOUT CARDIOVERSION N/A 09/24/2021   Procedure: TRANSESOPHAGEAL ECHOCARDIOGRAM (TEE);  Surgeon: Corliss Skains, MD;  Location: [facility] OR;  Service: Open Heart Surgery;  Laterality: N/A;    SOCIAL HISTORY: Social History   Socioeconomic History  Marital status: Widowed    Spouse name: Not on file   Number of children: 0   Years of education: Not on file   Highest education level: Not on file  Occupational History   Occupation: Retired Brewing technologist)  Tobacco Use   Smoking status: Never   Smokeless tobacco: Never  Vaping Use   Vaping status: Never Used  Substance and Sexual Activity   Alcohol use: Never   Drug use: Never   Sexual activity: Not Currently  Other Topics Concern   Not on file  Social History Narrative   Patient's wife passed away in 08-01-15 due to cancer - she was unable to have children.  Patient has a sister, his niece and her family lives in his home temporarily    Social Determinants of Health   Financial Resource Strain: Low Risk  (01/16/2022)   Overall Financial Resource Strain (CARDIA)    Difficulty of Paying Living Expenses: Not hard at all  Food Insecurity: No Food Insecurity (04/03/2023)   Hunger Vital Sign    Worried About Running Out of Food in the Last Year: Never true    Ran Out of Food in the Last  Year: Never true  Transportation Needs: No Transportation Needs (01/16/2022)   PRAPARE - Administrator, Civil Service (Medical): No    Lack of Transportation (Non-Medical): No  Physical Activity: Not on file  Stress: Not on file  Social Connections: Not on file  Intimate Partner Violence: Not At Risk (04/03/2023)   Humiliation, Afraid, Rape, and Kick questionnaire    Fear of Current or Ex-Partner: No    Emotionally Abused: No    Physically Abused: No    Sexually Abused: No    FAMILY HISTORY Family History  Problem Relation Age of Onset   Stroke Mother    Alzheimer's disease Mother     ALLERGIES:  is allergic to aricept [donepezil].  MEDICATIONS:  Current Outpatient Medications  Medication Sig Dispense Refill   acetaminophen (TYLENOL) 500 MG tablet Take 500 mg by mouth every 6 (six) hours as needed for mild pain or moderate pain.     aspirin EC 81 MG EC tablet Take 1 tablet (81 mg total) by mouth daily. Swallow whole.     clopidogrel (PLAVIX) 75 MG tablet Take 1 tablet (75 mg total) by mouth daily. 30 tablet 1   finasteride (PROSCAR) 5 MG tablet Take 1 tablet (5 mg total) by mouth daily. 30 tablet 5   meloxicam (MOBIC) 15 MG tablet Take 15 mg by mouth daily.     metFORMIN (GLUCOPHAGE) 500 MG tablet Take 1 tablet (500 mg total) by mouth every morning. 90 tablet 2   metoprolol tartrate (LOPRESSOR) 25 MG tablet Take 0.5 tablets (12.5 mg total) by mouth 2 (two) times daily. 30 tablet 1   ondansetron (ZOFRAN) 8 MG tablet Take 1 tablet (8 mg total) by mouth every 8 (eight) hours as needed for nausea or vomiting. 30 tablet 1   prochlorperazine (COMPAZINE) 10 MG tablet Take 1 tablet (10 mg total) by mouth every 6 (six) hours as needed for nausea or vomiting. 30 tablet 1   rosuvastatin (CRESTOR) 40 MG tablet Take 1 tablet (40 mg total) by mouth daily. 90 tablet 1   traMADol (ULTRAM) 50 MG tablet Take 50 mg by mouth every 6 (six) hours as needed.     No current  facility-administered medications for this visit.    PHYSICAL EXAMINATION:  ECOG PERFORMANCE STATUS: 1 - Symptomatic but  completely ambulatory   There were no vitals filed for this visit.    There were no vitals filed for this visit.     Physical Exam Vitals and nursing note reviewed.  Constitutional:      Appearance: Normal appearance. He is not diaphoretic.     Comments: Here alone  HENT:     Head: Normocephalic and atraumatic.     Right Ear: External ear normal.     Left Ear: External ear normal.     Nose: Nose normal.  Eyes:     Conjunctiva/sclera: Conjunctivae normal.     Pupils: Pupils are equal, round, and reactive to light.  Cardiovascular:     Rate and Rhythm: Normal rate and regular rhythm.     Heart sounds:     No friction rub. No gallop.  Pulmonary:     Effort: Pulmonary effort is normal. No respiratory distress.     Breath sounds: Normal breath sounds. No stridor.  Abdominal:     General: Abdomen is flat. There is no distension.     Palpations: Abdomen is soft.     Tenderness: There is no abdominal tenderness. There is no guarding.  Musculoskeletal:        General: No swelling. Normal range of motion.     Cervical back: Normal range of motion and neck supple. No rigidity or tenderness.     Right lower leg: No edema.     Left lower leg: No edema.  Lymphadenopathy:     Head:     Right side of head: No submental, submandibular, tonsillar, preauricular, posterior auricular or occipital adenopathy.     Left side of head: No submental, submandibular, tonsillar, preauricular, posterior auricular or occipital adenopathy.     Cervical: No cervical adenopathy.     Right cervical: No superficial, deep or posterior cervical adenopathy.    Left cervical: No superficial, deep or posterior cervical adenopathy.     Upper Body:     Right upper body: No supraclavicular or axillary adenopathy.     Left upper body: No supraclavicular or axillary adenopathy.     Lower  Body: No right inguinal adenopathy. No left inguinal adenopathy.  Skin:    Coloration: Skin is not jaundiced.     Findings: No bruising or erythema.     Comments: Fitzpatrick 2 skin tone.  Significant solar damage to face and arms.   Left leg bandaged.    Neurological:     General: No focal deficit present.     Mental Status: He is alert and oriented to person, place, and time.     Comments: Hard of hearing  Psychiatric:        Mood and Affect: Mood normal.        Behavior: Behavior normal.        Thought Content: Thought content normal.        Judgment: Judgment normal.     LABORATORY DATA: I have personally reviewed the data as listed:  Appointment on 06/19/2023  Component Date Value Ref Range Status   WBC Count 06/19/2023 10.6 (H)  4.0 - 10.5 K/uL Final   RBC 06/19/2023 4.66  4.22 - 5.81 MIL/uL Final   Hemoglobin 06/19/2023 13.8  13.0 - 17.0 g/dL Final   HCT 16/09/9603 41.5  39.0 - 52.0 % Final   MCV 06/19/2023 89.1  80.0 - 100.0 fL Final   MCH 06/19/2023 29.6  26.0 - 34.0 pg Final   MCHC 06/19/2023 33.3  30.0 - 36.0  g/dL Final   RDW 91/47/8295 13.5  11.5 - 15.5 % Final   Platelet Count 06/19/2023 318  150 - 400 K/uL Final   nRBC 06/19/2023 0.0  0.0 - 0.2 % Final   Neutrophils Relative % 06/19/2023 56  % Final   Neutro Abs 06/19/2023 5.9  1.7 - 7.7 K/uL Final   Lymphocytes Relative 06/19/2023 29  % Final   Lymphs Abs 06/19/2023 3.1  0.7 - 4.0 K/uL Final   Monocytes Relative 06/19/2023 10  % Final   Monocytes Absolute 06/19/2023 1.1 (H)  0.1 - 1.0 K/uL Final   Eosinophils Relative 06/19/2023 4  % Final   Eosinophils Absolute 06/19/2023 0.4  0.0 - 0.5 K/uL Final   Basophils Relative 06/19/2023 0  % Final   Basophils Absolute 06/19/2023 0.0  0.0 - 0.1 K/uL Final   Immature Granulocytes 06/19/2023 1  % Final   Abs Immature Granulocytes 06/19/2023 0.07  0.00 - 0.07 K/uL Final   Performed at [facility], [address] [place], [place] [address]    Sodium 06/19/2023 136  135 - 145 mmol/L Final   Potassium 06/19/2023 4.0  3.5 - 5.1 mmol/L Final   Chloride 06/19/2023 104  98 - 111 mmol/L Final   CO2 06/19/2023 22  22 - 32 mmol/L Final   Glucose, Bld 06/19/2023 276 (H)  70 - 99 mg/dL Final   Glucose reference range applies only to samples taken after fasting for at least 8 hours.   BUN 06/19/2023 15  8 - 23 mg/dL Final   Creatinine 86/57/8469 0.80  0.61 - 1.24 mg/dL Final   Calcium 62/95/2841 8.6 (L)  8.9 - 10.3 mg/dL Final   Total Protein 32/44/0102 6.6  6.5 - 8.1 g/dL Final   Albumin 72/53/6644 3.5  3.5 - 5.0 g/dL Final   AST 03/47/4259 21  15 - 41 U/L Final   ALT 06/19/2023 19  0 - 44 U/L Final   Alkaline Phosphatase 06/19/2023 82  38 - 126 U/L Final   Total Bilirubin 06/19/2023 0.8  0.3 - 1.2 mg/dL Final   GFR, Estimated 06/19/2023 >60  >60 mL/min Final   Comment: (NOTE) Calculated using the CKD-EPI Creatinine Equation (2021)    Anion gap 06/19/2023 10  5 - 15 Final   Performed at [facility], [address], [place], [place] [address]   T4, Total 06/19/2023 11.3  4.5 - 12.0 ug/dL Final   Comment: (NOTE) Performed At: [facility] [address] [place], [place] [id number] Jolene Schimke MD OA:[id number]    TSH 06/19/2023 0.131 (L)  0.350 - 4.500 uIU/mL Final   Comment: Performed by a 3rd Generation assay with a functional sensitivity of <=0.01 uIU/mL. Performed at [facility], [address] [place], [place] [address]   Appointment on 05/29/2023  Component Date Value Ref Range Status   Sodium 05/29/2023 137  135 - 145 mmol/L Final   Potassium 05/29/2023 4.3  3.5 - 5.1 mmol/L Final   Chloride 05/29/2023 102  98 - 111 mmol/L Final   CO2 05/29/2023 24  22 - 32 mmol/L Final   Glucose, Bld 05/29/2023 283 (H)  70 - 99 mg/dL Final   Glucose reference range applies only to samples taken after fasting for at least 8 hours.   BUN 05/29/2023 12  8 - 23 mg/dL Final   Creatinine, Ser  05/29/2023 0.78  0.61 - 1.24 mg/dL Final   Calcium 23/55/7322 8.7 (L)  8.9 - 10.3 mg/dL  Final   Total Protein 05/29/2023 6.9  6.5 - 8.1 g/dL Final   Albumin 19/14/7829 3.2 (L)  3.5 - 5.0 g/dL Final   AST 56/21/3086 19  15 - 41 U/L Final   ALT 05/29/2023 19  0 - 44 U/L Final   Alkaline Phosphatase 05/29/2023 104  38 - 126 U/L Final   Total Bilirubin 05/29/2023 0.7  0.3 - 1.2 mg/dL Final   GFR, Estimated 05/29/2023 >60  >60 mL/min Final   Comment: (NOTE) Calculated using the CKD-EPI Creatinine Equation (2021)    Anion gap 05/29/2023 11  5 - 15 Final   Performed at [facility], [address] [place], [place] [address]   WBC 05/29/2023 12.1 (H)  4.0 - 10.5 K/uL Final   RBC 05/29/2023 4.82  4.22 - 5.81 MIL/uL Final   Hemoglobin 05/29/2023 14.0  13.0 - 17.0 g/dL Final   HCT 96/29/5284 42.9  39.0 - 52.0 % Final   MCV 05/29/2023 89.0  80.0 - 100.0 fL Final   MCH 05/29/2023 29.0  26.0 - 34.0 pg Final   MCHC 05/29/2023 32.6  30.0 - 36.0 g/dL Final   RDW 13/24/4010 13.1  11.5 - 15.5 % Final   Platelets 05/29/2023 390  150 - 400 K/uL Final   nRBC 05/29/2023 0.0  0.0 - 0.2 % Final   Neutrophils Relative % 05/29/2023 63  % Final   Neutro Abs 05/29/2023 7.7  1.7 - 7.7 K/uL Final   Lymphocytes Relative 05/29/2023 23  % Final   Lymphs Abs 05/29/2023 2.7  0.7 - 4.0 K/uL Final   Monocytes Relative 05/29/2023 8  % Final   Monocytes Absolute 05/29/2023 1.0  0.1 - 1.0 K/uL Final   Eosinophils Relative 05/29/2023 4  % Final   Eosinophils Absolute 05/29/2023 0.4  0.0 - 0.5 K/uL Final   Basophils Relative 05/29/2023 1  % Final   Basophils Absolute 05/29/2023 0.1  0.0 - 0.1 K/uL Final   Immature Granulocytes 05/29/2023 1  % Final   Abs Immature Granulocytes 05/29/2023 0.12 (H)  0.00 - 0.07 K/uL Final   Performed at [facility], [address], [place], [place] [address]    RADIOGRAPHIC STUDIES: I have personally reviewed the radiological images as listed and  agree with the findings in the report  No results found.  ASSESSMENT/PLAN 77 y.o. male is here because of  malignant melanoma.  Medical history notable for diabetes mellitus, coronary artery disease, cerebrovascular disease, MI, cataracts  Malignant melanoma, distal left leg, Stage IIC (T4b N0 M0)- Risk factors for this patient are 1) chronic exposure to UV 2) Sunburns 3) Fitzpatrick Grade 2 skin tone March 19, 2023: Shave biopsy left lateral leg demonstrated malignant melanoma, nodular type. Breslow depth 5.1 mm. Clark's level 4. Ulceration present. Mitotic index 16/mm. Pathologic stage T4b. Deep margin involved  April 01 2023- Wide local excision.  Sentinel LN bx not performed due to patient frailty  Therapeutics   Apr 03 2023- Will obtain CBC with diff, CMP, LDH, MRI brain and whole body PET/CT for staging  Patient meets criteria per NCCN Guidelines for adjuvant immunotherapy and will plan on adjuvant Pembrolizumab  May 06 2023- Chemotherapy teaching  May 13 2023:  Cycle 1 Pembrolizumab  June 03 2023:  Cycle 2 Pembrolizumab  June 24 2023:  TSH 0.31 Free T4 11.3-- Will follow as this may be due to thyroiditis from Pembrolizumab.   Cycle 3 Pembrolizumab   Immunotherapy Risks:   May 06 2023-  Reiterated potential side effects of immunotherapy with the patient.  These include but are not limited to:  Fatigue, Hair loss, low blood counts (anemia, thrombocytopenia) that may necessitate transfusion, bleeding, infection, nausea/ vomiting/Appetite changes/ Constipation/ Diarrhea, mucositis, Neuropathy/ neurologic problems, skin and nail changes such as dry skin and color change, Urine and bladder changes and kidney problems, weight changes, mood changes, decreased libido/fertility problems, damage to heart and lungs.  Some of these side effects can be life threatening, may be permanent and can result in hospitalization and/or death.  In shared decision making patient has agreed to proceed with  immunotherapy.    Poor IV access  Apr 29 2023- Has undergone portacath placement  Urinary frequency  May 06 2023- Likely secondary to BPH.  PSA 7.11 and began Proscar.    June 24 2023- Urinary frequency improved.  May be due to BPH with contribution of hyperglycemia  Elevated PSA  May 06 2023- PSA 7.11  June 24 2023- Referring to urology for evaluation     Cancer Staging  Malignant melanoma [facility]) Staging form: Melanoma of the Skin, AJCC 8th Edition - Clinical stage from 04/03/2023: Stage IIC (cT4b, cN0, cM0) - Signed by Loni Muse, MD on 04/03/2023 Histopathologic type: Nodular melanoma Stage prefix: Initial diagnosis    No problem-specific Assessment & Plan notes found for this encounter.    No orders of the defined types were placed in this encounter.   30 minutes was spent in patient care.  This included time spent preparing to see the patient (e.g., review of tests), obtaining and/or reviewing separately obtained history, counseling and educating the patient/family/caregiver, ordering medications, tests, or procedures; documenting clinical information in the electronic or other health record, independently interpreting results and communicating results to the patient/family/caregiver as well as coordination of care.       All questions were answered. The patient knows to call the clinic with any problems, questions or concerns.  This note was electronically signed.    Loni Muse, MD  06/24/2023 10:44 AM

## 2023-06-25 ENCOUNTER — Ambulatory Visit: Payer: Medicare HMO | Admitting: Neurology

## 2023-06-25 ENCOUNTER — Other Ambulatory Visit: Payer: Self-pay

## 2023-06-25 ENCOUNTER — Encounter: Payer: Self-pay | Admitting: Neurology

## 2023-06-30 DIAGNOSIS — Z7902 Long term (current) use of antithrombotics/antiplatelets: Secondary | ICD-10-CM | POA: Diagnosis not present

## 2023-06-30 DIAGNOSIS — Z951 Presence of aortocoronary bypass graft: Secondary | ICD-10-CM | POA: Diagnosis not present

## 2023-06-30 DIAGNOSIS — Z8673 Personal history of transient ischemic attack (TIA), and cerebral infarction without residual deficits: Secondary | ICD-10-CM | POA: Diagnosis not present

## 2023-06-30 DIAGNOSIS — Z7901 Long term (current) use of anticoagulants: Secondary | ICD-10-CM | POA: Diagnosis not present

## 2023-06-30 DIAGNOSIS — E119 Type 2 diabetes mellitus without complications: Secondary | ICD-10-CM | POA: Diagnosis not present

## 2023-06-30 DIAGNOSIS — I10 Essential (primary) hypertension: Secondary | ICD-10-CM | POA: Diagnosis not present

## 2023-06-30 DIAGNOSIS — G9389 Other specified disorders of brain: Secondary | ICD-10-CM | POA: Diagnosis not present

## 2023-06-30 DIAGNOSIS — I6782 Cerebral ischemia: Secondary | ICD-10-CM | POA: Diagnosis not present

## 2023-06-30 DIAGNOSIS — R000 Tachycardia, unspecified: Secondary | ICD-10-CM | POA: Diagnosis not present

## 2023-06-30 DIAGNOSIS — I6523 Occlusion and stenosis of bilateral carotid arteries: Secondary | ICD-10-CM | POA: Diagnosis not present

## 2023-06-30 DIAGNOSIS — R2990 Unspecified symptoms and signs involving the nervous system: Secondary | ICD-10-CM | POA: Diagnosis not present

## 2023-06-30 DIAGNOSIS — E785 Hyperlipidemia, unspecified: Secondary | ICD-10-CM | POA: Diagnosis not present

## 2023-06-30 DIAGNOSIS — I2510 Atherosclerotic heart disease of native coronary artery without angina pectoris: Secondary | ICD-10-CM | POA: Diagnosis not present

## 2023-06-30 DIAGNOSIS — Z79899 Other long term (current) drug therapy: Secondary | ICD-10-CM | POA: Diagnosis not present

## 2023-06-30 DIAGNOSIS — Z7982 Long term (current) use of aspirin: Secondary | ICD-10-CM | POA: Diagnosis not present

## 2023-06-30 DIAGNOSIS — G459 Transient cerebral ischemic attack, unspecified: Secondary | ICD-10-CM | POA: Diagnosis not present

## 2023-06-30 DIAGNOSIS — R2681 Unsteadiness on feet: Secondary | ICD-10-CM | POA: Diagnosis not present

## 2023-06-30 DIAGNOSIS — Z7984 Long term (current) use of oral hypoglycemic drugs: Secondary | ICD-10-CM | POA: Diagnosis not present

## 2023-06-30 DIAGNOSIS — R4182 Altered mental status, unspecified: Secondary | ICD-10-CM | POA: Diagnosis not present

## 2023-07-01 DIAGNOSIS — Z7901 Long term (current) use of anticoagulants: Secondary | ICD-10-CM | POA: Diagnosis not present

## 2023-07-01 DIAGNOSIS — I6389 Other cerebral infarction: Secondary | ICD-10-CM | POA: Diagnosis not present

## 2023-07-01 DIAGNOSIS — R2990 Unspecified symptoms and signs involving the nervous system: Secondary | ICD-10-CM | POA: Diagnosis not present

## 2023-07-01 DIAGNOSIS — I6522 Occlusion and stenosis of left carotid artery: Secondary | ICD-10-CM | POA: Diagnosis not present

## 2023-07-01 DIAGNOSIS — I6502 Occlusion and stenosis of left vertebral artery: Secondary | ICD-10-CM | POA: Diagnosis not present

## 2023-07-01 DIAGNOSIS — I672 Cerebral atherosclerosis: Secondary | ICD-10-CM | POA: Diagnosis not present

## 2023-07-01 DIAGNOSIS — Z8673 Personal history of transient ischemic attack (TIA), and cerebral infarction without residual deficits: Secondary | ICD-10-CM | POA: Diagnosis not present

## 2023-07-01 DIAGNOSIS — I6782 Cerebral ischemia: Secondary | ICD-10-CM | POA: Diagnosis not present

## 2023-07-08 ENCOUNTER — Other Ambulatory Visit: Payer: Self-pay | Admitting: Oncology

## 2023-07-08 DIAGNOSIS — C4372 Malignant melanoma of left lower limb, including hip: Secondary | ICD-10-CM

## 2023-07-09 ENCOUNTER — Encounter: Payer: Self-pay | Admitting: Internal Medicine

## 2023-07-10 ENCOUNTER — Inpatient Hospital Stay: Payer: Medicare HMO | Attending: Oncology | Admitting: Oncology

## 2023-07-10 ENCOUNTER — Inpatient Hospital Stay: Payer: Medicare HMO

## 2023-07-10 DIAGNOSIS — E782 Mixed hyperlipidemia: Secondary | ICD-10-CM | POA: Insufficient documentation

## 2023-07-10 DIAGNOSIS — R972 Elevated prostate specific antigen [PSA]: Secondary | ICD-10-CM | POA: Insufficient documentation

## 2023-07-10 DIAGNOSIS — Z7902 Long term (current) use of antithrombotics/antiplatelets: Secondary | ICD-10-CM | POA: Insufficient documentation

## 2023-07-10 DIAGNOSIS — Z79899 Other long term (current) drug therapy: Secondary | ICD-10-CM | POA: Insufficient documentation

## 2023-07-10 DIAGNOSIS — I6782 Cerebral ischemia: Secondary | ICD-10-CM | POA: Insufficient documentation

## 2023-07-10 DIAGNOSIS — E119 Type 2 diabetes mellitus without complications: Secondary | ICD-10-CM | POA: Insufficient documentation

## 2023-07-10 DIAGNOSIS — I252 Old myocardial infarction: Secondary | ICD-10-CM | POA: Insufficient documentation

## 2023-07-10 DIAGNOSIS — Z8673 Personal history of transient ischemic attack (TIA), and cerebral infarction without residual deficits: Secondary | ICD-10-CM | POA: Insufficient documentation

## 2023-07-10 DIAGNOSIS — I081 Rheumatic disorders of both mitral and tricuspid valves: Secondary | ICD-10-CM | POA: Insufficient documentation

## 2023-07-10 DIAGNOSIS — Z951 Presence of aortocoronary bypass graft: Secondary | ICD-10-CM | POA: Insufficient documentation

## 2023-07-10 DIAGNOSIS — Z823 Family history of stroke: Secondary | ICD-10-CM | POA: Insufficient documentation

## 2023-07-10 DIAGNOSIS — Z818 Family history of other mental and behavioral disorders: Secondary | ICD-10-CM | POA: Insufficient documentation

## 2023-07-10 DIAGNOSIS — Z8719 Personal history of other diseases of the digestive system: Secondary | ICD-10-CM | POA: Insufficient documentation

## 2023-07-10 DIAGNOSIS — K5900 Constipation, unspecified: Secondary | ICD-10-CM | POA: Insufficient documentation

## 2023-07-10 DIAGNOSIS — Z888 Allergy status to other drugs, medicaments and biological substances status: Secondary | ICD-10-CM | POA: Insufficient documentation

## 2023-07-10 DIAGNOSIS — F05 Delirium due to known physiological condition: Secondary | ICD-10-CM | POA: Insufficient documentation

## 2023-07-10 DIAGNOSIS — I2510 Atherosclerotic heart disease of native coronary artery without angina pectoris: Secondary | ICD-10-CM | POA: Insufficient documentation

## 2023-07-10 DIAGNOSIS — I6522 Occlusion and stenosis of left carotid artery: Secondary | ICD-10-CM | POA: Insufficient documentation

## 2023-07-10 DIAGNOSIS — C4372 Malignant melanoma of left lower limb, including hip: Secondary | ICD-10-CM | POA: Insufficient documentation

## 2023-07-10 DIAGNOSIS — R350 Frequency of micturition: Secondary | ICD-10-CM | POA: Insufficient documentation

## 2023-07-10 DIAGNOSIS — R600 Localized edema: Secondary | ICD-10-CM | POA: Insufficient documentation

## 2023-07-10 NOTE — Progress Notes (Deleted)
[facility] Cancer Initial Visit:  Patient Care Team: Jon Nelson, [place] as PCP - General (Family Medicine) Jon Lea, MD as PCP - Cardiology (Cardiology) Jon Slicker Deberah Castle, MD as Consulting Physician (Optometry) Jon Shearer, MD as Referring Physician (Dermatology) Jon Muse, MD as Consulting Physician (Internal Medicine)  CHIEF COMPLAINTS/PURPOSE OF CONSULTATION:  Oncology History  Malignant melanoma (HCC)  04/03/2023 Initial Diagnosis   Malignant melanoma (HCC)   04/03/2023 Cancer Staging   Staging form: Melanoma of the Skin, AJCC 8th Edition - Clinical stage from 04/03/2023: Stage IIC (cT4b, cN0, cM0) - Signed by Jon Muse, MD on 04/03/2023 Histopathologic type: Nodular melanoma Stage prefix: Initial diagnosis   05/13/2023 -  Chemotherapy   Patient is on Treatment Plan : MELANOMA Pembrolizumab (200) q21d       HISTORY OF PRESENTING ILLNESS: Jon Nelson 77 y.o. 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  Breslow depth 5.1 mm.  Clark's level 4.  Ulceration present.  Mitotic index 16/mm.  Pathologic stage T4b.  Deep margin involved  Apr 03 2023:  Jon Nelson Medical Oncology Consult Patient here with sister in law Ms Jon Nelson who is also his power of attorney.  He has had a mole on the left lateral leg for years but in the last year it degenerated into a sore which didn't heal which prompted him to see a dermatologist.  He has been followed by dermatology for removal of non-melanomatous skin cancers on his face and arms over the years.  He underwent a shave biopsy of the left leg lesion on March 19 2023.  He has not been noted to have in transit lesions.  April 01 2023 he underwent a wide local excision of the region but did not undergo  inguinal lymphadenectomy.     Social:  Widowed.  Lives with his dog.  Worked for [employer] where he worked outside.  No smoking.  EtOH none  Jon Nelson Mother died 63 dementia Father estranged No siblings.     Apr 12 2023:  MRI brain No evidence of metastatic disease. 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  Apr 14 2023:  Whole body PET/CT Postsurgical changes in the lateral left lower leg, as above.  No findings suspicious for metastatic disease.   Apr 29 2023:   Has undergone port placement since last visit.  Has not yet been scheduled for teaching or therapy.  Discussed risks and benefits of immunotherapy    May 06 2023:   Experiencing constipation.  Has urinary frequency but not interested in seeing a urologist.  To undergo chemotherapy teaching today.  Will check PSA  May 13 2023:  Cycle 1 Pembrolizumab  June 03 2023:  Cycle 2 Pembrolizumab  June 24 2023:  Scheduled follow up for melanoma.  LE edema improved.  Does not check FSBG's.  Sister could not come to visit today because she struck a deer while driving.    TSH 0.31 Free T4 11.3 CMP notable for glucose 276  Cycle 3 Pembrolizumab   Review of Systems  Constitutional:  Negative for appetite change, chills, fatigue, fever and unexpected weight change.  HENT:   Positive for hearing loss. Negative for lump/mass, mouth sores, nosebleeds, sore throat and trouble swallowing.        Deaf left  ear  Eyes:  Positive for eye problems. Negative for icterus.       Vision changes:  None  Respiratory:  Negative for chest tightness, cough, hemoptysis and wheezing.        DOE on walking up hill  Cardiovascular:  Positive for leg swelling. Negative for chest pain and palpitations.       PND:  none Orthopnea:  none  Gastrointestinal:  Negative for abdominal distention, abdominal pain, blood in stool, constipation, diarrhea, nausea and vomiting.  Endocrine: Negative for hot flashes.       Cold  intolerance:  none Heat intolerance:  none  Genitourinary:  Positive for frequency. Negative for bladder incontinence, difficulty urinating, dysuria and hematuria.        Nocturia x 3 to 4  Musculoskeletal:  Negative for arthralgias, back pain, gait problem, myalgias, neck pain and neck stiffness.  Skin:  Negative for rash.       Chronic pruritus of arms.  Healing from surgery to left leg  Neurological:  Negative for extremity weakness, gait problem, headaches, numbness and speech difficulty.       Occasional dizziness particularly when bending over.  No falls   Hematological:  Negative for adenopathy. Does not bruise/bleed easily.  Psychiatric/Behavioral:  Negative for sleep disturbance and suicidal ideas. The patient is not nervous/anxious.     MEDICAL HISTORY: Past Medical History:  Diagnosis Date  . Carotid artery stenosis   . Coronary artery stenosis   . Diabetes (HCC)   . Diabetes mellitus with cardiac complication (HCC)   . GI bleed   . HLD (hyperlipidemia)   . HTN (hypertension)   . Mixed hyperlipidemia   . 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 Old myocardial infarct   . Other amnesia   . TIA (transient ischemic attack)     SURGICAL HISTORY: Past Surgical History:  Procedure Laterality Date  . CATARACT EXTRACTION Left 08/15/2020  . CORONARY ARTERY BYPASS GRAFT N/A 09/24/2021   Procedure: CORONARY ARTERY BYPASS GRAFTING (CABG) TIMES THREE , ON PUMP, USING LEFT INTERNAL MAMMARY ARTERY AND ENDOSCOPICALLY HARVESTED RIGHT GREATER SAPHENOUS VEIN;  Surgeon: Corliss Skains, MD;  Location: [facility] OR;  Service: Open Heart Surgery;  Laterality: N/A;  . ENDOVEIN HARVEST OF GREATER SAPHENOUS VEIN Right 09/24/2021   Procedure: ENDOVEIN HARVEST OF GREATER SAPHENOUS VEIN;  Surgeon: Corliss Skains, MD;  Location: [facility] OR;  Service: Open Heart Surgery;  Laterality: Right;  . HEMORRHOID SURGERY    . HERNIA REPAIR  2021   . LEFT HEART CATH AND CORONARY ANGIOGRAPHY N/A 09/18/2021   Procedure: LEFT HEART CATH AND CORONARY ANGIOGRAPHY;  Surgeon: Lyn Records, MD;  Location: [facility] INVASIVE CV LAB;  Service: Cardiovascular;  Laterality: N/A;  . LOOP RECORDER INSERTION N/A 02/03/2019   Procedure: LOOP RECORDER INSERTION;  Surgeon: Regan Lemming, MD;  Location: [facility] INVASIVE CV LAB;  Service: Cardiovascular;  Laterality: N/A;  . MOLE REMOVAL    . PORTA CATH INSERTION    . SKIN CANCER EXCISION    . TEE WITHOUT CARDIOVERSION N/A 02/03/2019   Procedure: TRANSESOPHAGEAL ECHOCARDIOGRAM (TEE);  Surgeon: Jake Bathe, MD;  Location: [facility];  Service: Cardiovascular;  Laterality: N/A;  loop  . TEE WITHOUT CARDIOVERSION N/A 09/24/2021   Procedure: TRANSESOPHAGEAL ECHOCARDIOGRAM (TEE);  Surgeon: Corliss Skains, MD;  Location: [facility] OR;  Service: Open Heart Surgery;  Laterality: N/A;    SOCIAL HISTORY: Social History   Socioeconomic History  .  Marital status: Widowed    Spouse name: Not on file  . Number of children: 0  . Years of education: Not on file  . Highest education level: Not on file  Occupational History  . Occupation: Retired Brewing technologist)  Tobacco Use  . Smoking status: Never  . Smokeless tobacco: Never  Vaping Use  . Vaping status: Never Used  Substance and Sexual Activity  . Alcohol use: Never  . Drug use: Never  . Sexual activity: Not Currently  Other Topics Concern  . Not on file  Social History Narrative   Patient's wife passed away in 07/23/15 due to cancer - she was unable to have children.  Patient has a sister, his niece and her family lives in his home temporarily    Social Determinants of Health   Financial Resource Strain: Low Risk  (01/16/2022)   Overall Financial Resource Strain (CARDIA)   . Difficulty of Paying Living Expenses: Not hard at all  Food Insecurity: No Food Insecurity (04/03/2023)   Hunger Vital Sign   . Worried About Programme researcher, broadcasting/film/video in the Last Year: Never  true   . Ran Out of Food in the Last Year: Never true  Transportation Needs: No Transportation Needs (01/16/2022)   PRAPARE - Transportation   . Lack of Transportation (Medical): No   . Lack of Transportation (Non-Medical): No  Physical Activity: Not on file  Stress: Not on file  Social Connections: Not on file  Intimate Partner Violence: Not At Risk (04/03/2023)   Humiliation, Afraid, Rape, and Kick questionnaire   . Fear of Current or Ex-Partner: No   . Emotionally Abused: No   . Physically Abused: No   . Sexually Abused: No    FAMILY HISTORY Family History  Problem Relation Age of Onset  . Stroke Mother   . Alzheimer's disease Mother     ALLERGIES:  is allergic to aricept [donepezil].  MEDICATIONS:  Current Outpatient Medications  Medication Sig Dispense Refill  . acetaminophen (TYLENOL) 500 MG tablet Take 500 mg by mouth every 6 (six) hours as needed for mild pain or moderate pain. (Patient not taking: Reported on 06/24/2023)    . aspirin EC 81 MG EC tablet Take 1 tablet (81 mg total) by mouth daily. Swallow whole. (Patient not taking: Reported on 06/24/2023)    . clopidogrel (PLAVIX) 75 MG tablet Take 1 tablet (75 mg total) by mouth daily. 30 tablet 1  . finasteride (PROSCAR) 5 MG tablet Take 1 tablet (5 mg total) by mouth daily. (Patient not taking: Reported on 06/24/2023) 30 tablet 5  . meloxicam (MOBIC) 15 MG tablet Take 15 mg by mouth daily. (Patient not taking: Reported on 06/24/2023)    . metFORMIN (GLUCOPHAGE) 500 MG tablet Take 1 tablet (500 mg total) by mouth every morning. 90 tablet 2  . metoprolol tartrate (LOPRESSOR) 25 MG tablet Take 0.5 tablets (12.5 mg total) by mouth 2 (two) times daily. (Patient not taking: Reported on 06/24/2023) 30 tablet 1  . ondansetron (ZOFRAN) 8 MG tablet Take 1 tablet (8 mg total) by mouth every 8 (eight) hours as needed for nausea or vomiting. (Patient not taking: Reported on 06/24/2023) 30 tablet 1  . prochlorperazine (COMPAZINE) 10 MG  tablet Take 1 tablet (10 mg total) by mouth every 6 (six) hours as needed for nausea or vomiting. (Patient not taking: Reported on 06/24/2023) 30 tablet 1  . rosuvastatin (CRESTOR) 40 MG tablet Take 1 tablet (40 mg total) by mouth daily. (Patient not taking:  Reported on 06/24/2023) 90 tablet 1  . traMADol (ULTRAM) 50 MG tablet Take 50 mg by mouth every 6 (six) hours as needed. (Patient not taking: Reported on 06/24/2023)     No current facility-administered medications for this visit.    PHYSICAL EXAMINATION:  ECOG PERFORMANCE STATUS: 1 - Symptomatic but completely ambulatory   There were no vitals filed for this visit.    There were no vitals filed for this visit.     Physical Exam Vitals and nursing note reviewed.  Constitutional:      Appearance: Normal appearance. He is not diaphoretic.     Comments: Here alone  HENT:     Head: Normocephalic and atraumatic.     Right Ear: External ear normal.     Left Ear: External ear normal.     Nose: Nose normal.  Eyes:     Conjunctiva/sclera: Conjunctivae normal.     Pupils: Pupils are equal, round, and reactive to light.  Cardiovascular:     Rate and Rhythm: Normal rate and regular rhythm.     Heart sounds:     No friction rub. No gallop.  Pulmonary:     Effort: Pulmonary effort is normal. No respiratory distress.     Breath sounds: Normal breath sounds. No stridor.  Abdominal:     General: Abdomen is flat. There is no distension.     Palpations: Abdomen is soft.     Tenderness: There is no abdominal tenderness. There is no guarding.  Musculoskeletal:        General: No swelling. Normal range of motion.     Cervical back: Normal range of motion and neck supple. No rigidity or tenderness.     Right lower leg: No edema.     Left lower leg: No edema.  Lymphadenopathy:     Head:     Right side of head: No submental, submandibular, tonsillar, preauricular, posterior auricular or occipital adenopathy.     Left side of head: No  submental, submandibular, tonsillar, preauricular, posterior auricular or occipital adenopathy.     Cervical: No cervical adenopathy.     Right cervical: No superficial, deep or posterior cervical adenopathy.    Left cervical: No superficial, deep or posterior cervical adenopathy.     Upper Body:     Right upper body: No supraclavicular or axillary adenopathy.     Left upper body: No supraclavicular or axillary adenopathy.     Lower Body: No right inguinal adenopathy. No left inguinal adenopathy.  Skin:    Coloration: Skin is not jaundiced.     Findings: No bruising or erythema.     Comments: Fitzpatrick 2 skin tone.  Significant solar damage to face and arms.   Left leg bandaged.    Neurological:     General: No focal deficit present.     Mental Status: He is alert and oriented to person, place, and time.     Comments: Hard of hearing  Psychiatric:        Mood and Affect: Mood normal.        Behavior: Behavior normal.        Thought Content: Thought content normal.        Judgment: Judgment normal.    LABORATORY DATA: I have personally reviewed the data as listed:  Appointment on 06/24/2023  Component Date Value Ref Range Status  . Cortisol, Plasma 06/24/2023 7.4  ug/dL Final   Comment: (NOTE) AM    6.7 - 22.6 ug/dL PM   <  10.0       ug/dL Performed at [facility], [address] [place], [place] [address]   . Free T4 06/24/2023 2.18 (H)  0.61 - 1.12 ng/dL Final   Comment: (NOTE) Biotin ingestion may interfere with free T4 tests. If the results are inconsistent with the TSH level, previous test results, or the clinical presentation, then consider biotin interference. If needed, order repeat testing after stopping biotin. Performed at [facility], [address] [place], [place] [address]   . TSH 06/24/2023 0.025 (L)  0.350 - 4.500 uIU/mL Final   Comment: Performed by a 3rd Generation assay with a functional sensitivity of <=0.01 uIU/mL. Performed at  [facility], [address], [place], [place] [address]   . Sodium 06/24/2023 137  135 - 145 mmol/L Final  . Potassium 06/24/2023 4.2  3.5 - 5.1 mmol/L Final  . Chloride 06/24/2023 105  98 - 111 mmol/L Final  . CO2 06/24/2023 23  22 - 32 mmol/L Final  . Glucose, Bld 06/24/2023 147 (H)  70 - 99 mg/dL Final   Glucose reference range applies only to samples taken after fasting for at least 8 hours.  . BUN 06/24/2023 15  8 - 23 mg/dL Final  . Creatinine, Ser 06/24/2023 0.75  0.61 - 1.24 mg/dL Final  . Calcium 69/62/9528 9.0  8.9 - 10.3 mg/dL Final  . Total Protein 06/24/2023 6.6  6.5 - 8.1 g/dL Final  . Albumin 41/32/4401 3.6  3.5 - 5.0 g/dL Final  . AST 02/72/5366 19  15 - 41 U/L Final  . ALT 06/24/2023 18  0 - 44 U/L Final  . Alkaline Phosphatase 06/24/2023 77  38 - 126 U/L Final  . Total Bilirubin 06/24/2023 0.6  0.3 - 1.2 mg/dL Final  . GFR, Estimated 06/24/2023 >60  >60 mL/min Final   Comment: (NOTE) Calculated using the CKD-EPI Creatinine Equation (2021)   . Anion gap 06/24/2023 9  5 - 15 Final   Performed at [facility], [address] [place], [place] [address]  . WBC 06/24/2023 10.0  4.0 - 10.5 K/uL Final  . RBC 06/24/2023 4.83  4.22 - 5.81 MIL/uL Final  . Hemoglobin 06/24/2023 14.3  13.0 - 17.0 g/dL Final  . HCT 74/25/9563 44.1  39.0 - 52.0 % Final  . MCV 06/24/2023 91.3  80.0 - 100.0 fL Final  . MCH 06/24/2023 29.6  26.0 - 34.0 pg Final  . MCHC 06/24/2023 32.4  30.0 - 36.0 g/dL Final  . RDW 87/56/4332 13.4  11.5 - 15.5 % Final  . Platelets 06/24/2023 294  150 - 400 K/uL Final  . nRBC 06/24/2023 0.0  0.0 - 0.2 % Final  . Neutrophils Relative % 06/24/2023 51  % Final  . Neutro Abs 06/24/2023 5.1  1.7 - 7.7 K/uL Final  . Lymphocytes Relative 06/24/2023 33  % Final  . Lymphs Abs 06/24/2023 3.3  0.7 - 4.0 K/uL Final  . Monocytes Relative 06/24/2023 12  % Final  . Monocytes Absolute 06/24/2023 1.2 (H)  0.1 - 1.0 K/uL Final  .  Eosinophils Relative 06/24/2023 3  % Final  . Eosinophils Absolute 06/24/2023 0.3  0.0 - 0.5 K/uL Final  . Basophils Relative 06/24/2023 0  % Final  . Basophils Absolute 06/24/2023 0.0  0.0 - 0.1 K/uL Final  . Immature Granulocytes 06/24/2023 1  % Final  . Abs Immature Granulocytes 06/24/2023 0.05  0.00 - 0.07 K/uL Final   Performed  at [facility], [address] [place], [place] [address]  Appointment on 06/19/2023  Component Date Value Ref Range Status  . WBC Count 06/19/2023 10.6 (H)  4.0 - 10.5 K/uL Final  . RBC 06/19/2023 4.66  4.22 - 5.81 MIL/uL Final  . Hemoglobin 06/19/2023 13.8  13.0 - 17.0 g/dL Final  . HCT 56/21/3086 41.5  39.0 - 52.0 % Final  . MCV 06/19/2023 89.1  80.0 - 100.0 fL Final  . MCH 06/19/2023 29.6  26.0 - 34.0 pg Final  . MCHC 06/19/2023 33.3  30.0 - 36.0 g/dL Final  . RDW 57/84/6962 13.5  11.5 - 15.5 % Final  . Platelet Count 06/19/2023 318  150 - 400 K/uL Final  . nRBC 06/19/2023 0.0  0.0 - 0.2 % Final  . Neutrophils Relative % 06/19/2023 56  % Final  . Neutro Abs 06/19/2023 5.9  1.7 - 7.7 K/uL Final  . Lymphocytes Relative 06/19/2023 29  % Final  . Lymphs Abs 06/19/2023 3.1  0.7 - 4.0 K/uL Final  . Monocytes Relative 06/19/2023 10  % Final  . Monocytes Absolute 06/19/2023 1.1 (H)  0.1 - 1.0 K/uL Final  . Eosinophils Relative 06/19/2023 4  % Final  . Eosinophils Absolute 06/19/2023 0.4  0.0 - 0.5 K/uL Final  . Basophils Relative 06/19/2023 0  % Final  . Basophils Absolute 06/19/2023 0.0  0.0 - 0.1 K/uL Final  . Immature Granulocytes 06/19/2023 1  % Final  . Abs Immature Granulocytes 06/19/2023 0.07  0.00 - 0.07 K/uL Final   Performed at [facility], [address] [place], [place] [address]  . Sodium 06/19/2023 136  135 - 145 mmol/L Final  . Potassium 06/19/2023 4.0  3.5 - 5.1 mmol/L Final  . Chloride 06/19/2023 104  98 - 111 mmol/L Final  . CO2 06/19/2023 22  22 - 32 mmol/L Final  . Glucose, Bld 06/19/2023 276  (H)  70 - 99 mg/dL Final   Glucose reference range applies only to samples taken after fasting for at least 8 hours.  . BUN 06/19/2023 15  8 - 23 mg/dL Final  . Creatinine 13/24/4010 0.80  0.61 - 1.24 mg/dL Final  . Calcium 27/25/3664 8.6 (L)  8.9 - 10.3 mg/dL Final  . Total Protein 06/19/2023 6.6  6.5 - 8.1 g/dL Final  . Albumin 40/34/7425 3.5  3.5 - 5.0 g/dL Final  . AST 95/63/8756 21  15 - 41 U/L Final  . ALT 06/19/2023 19  0 - 44 U/L Final  . Alkaline Phosphatase 06/19/2023 82  38 - 126 U/L Final  . Total Bilirubin 06/19/2023 0.8  0.3 - 1.2 mg/dL Final  . GFR, Estimated 06/19/2023 >60  >60 mL/min Final   Comment: (NOTE) Calculated using the CKD-EPI Creatinine Equation (2021)   . Anion gap 06/19/2023 10  5 - 15 Final   Performed at [facility], [address] [place], [place] [address]  . T4, Total 06/19/2023 11.3  4.5 - 12.0 ug/dL Final   Comment: (NOTE) Performed At: [facility] [address] [place], [place] [id number] Jolene Schimke MD [id number]   . TSH 06/19/2023 0.131 (L)  0.350 - 4.500 uIU/mL Final   Comment: Performed by a 3rd Generation assay with a functional sensitivity of <=0.01 uIU/mL. Performed at [facility], [address], [place], [place] [address]     RADIOGRAPHIC STUDIES: I have personally reviewed the radiological images as listed and agree with the findings in  the report  No results found.  ASSESSMENT/PLAN 77 y.o. male is here because of  malignant melanoma.  Medical history notable for diabetes mellitus, coronary artery disease, cerebrovascular disease, MI, cataracts  Malignant melanoma, distal left leg, Stage IIC (T4b N0 M0)- Risk factors for this patient are 1) chronic exposure to UV 2) Sunburns 3) Fitzpatrick Grade 2 skin tone March 19, 2023: Shave biopsy left lateral leg demonstrated malignant melanoma, nodular type. Breslow depth 5.1 mm. Clark's level 4. Ulceration present. Mitotic index  16/mm. Pathologic stage T4b. Deep margin involved  April 01 2023- Wide local excision.  Sentinel LN bx not performed due to patient frailty  Therapeutics   Apr 03 2023- Will obtain CBC with diff, CMP, LDH, MRI brain and whole body PET/CT for staging  Patient meets criteria per NCCN Guidelines for adjuvant immunotherapy and will plan on adjuvant Pembrolizumab  May 06 2023- Chemotherapy teaching  May 13 2023:  Cycle 1 Pembrolizumab  June 03 2023:  Cycle 2 Pembrolizumab  June 24 2023:  TSH 0.31 Free T4 11.3-- Will follow as this may be due to thyroiditis from Pembrolizumab.   Cycle 3 Pembrolizumab   Immunotherapy Risks:   May 06 2023- Reiterated potential side effects of immunotherapy with the patient.  These include but are not limited to:  Fatigue, Hair loss, low blood counts (anemia, thrombocytopenia) that may necessitate transfusion, bleeding, infection, nausea/ vomiting/Appetite changes/ Constipation/ Diarrhea, mucositis, Neuropathy/ neurologic problems, skin and nail changes such as dry skin and color change, Urine and bladder changes and kidney problems, weight changes, mood changes, decreased libido/fertility problems, damage to heart and lungs.  Some of these side effects can be life threatening, may be permanent and can result in hospitalization and/or death.  In shared decision making patient has agreed to proceed with immunotherapy.    Poor IV access  Apr 29 2023- Has undergone portacath placement  Urinary frequency  May 06 2023- Likely secondary to BPH.  PSA 7.11 and began Proscar.    June 24 2023- Urinary frequency improved.  May be due to BPH with contribution of hyperglycemia  Elevated PSA  May 06 2023- PSA 7.11  June 24 2023- Referring to urology for evaluation     Cancer Staging  Malignant melanoma [facility]) Staging form: Melanoma of the Skin, AJCC 8th Edition - Clinical stage from 04/03/2023: Stage IIC (cT4b, cN0, cM0) - Signed by Jon Muse, MD on  04/03/2023 Histopathologic type: Nodular melanoma Stage prefix: Initial diagnosis    No problem-specific Assessment & Plan notes found for this encounter.    No orders of the defined types were placed in this encounter.   30 minutes was spent in patient care.  This included time spent preparing to see the patient (e.g., review of tests), obtaining and/or reviewing separately obtained history, counseling and educating the patient/family/caregiver, ordering medications, tests, or procedures; documenting clinical information in the electronic or other health record, independently interpreting results and communicating results to the patient/family/caregiver as well as coordination of care.       All questions were answered. The patient knows to call the clinic with any problems, questions or concerns.  This note was electronically signed.    Jon Muse, MD  07/10/2023 8:36 AM

## 2023-07-15 ENCOUNTER — Inpatient Hospital Stay: Payer: Medicare HMO

## 2023-07-15 ENCOUNTER — Inpatient Hospital Stay: Payer: Medicare HMO | Admitting: Hematology and Oncology

## 2023-07-15 ENCOUNTER — Ambulatory Visit: Payer: Medicare HMO

## 2023-07-15 ENCOUNTER — Inpatient Hospital Stay: Payer: Medicare HMO | Admitting: Oncology

## 2023-07-15 NOTE — Progress Notes (Deleted)
[facility] Cancer Initial Visit:  Patient Care Team: Gus Height, [place] as PCP - General (Family Medicine) [name], MD as PCP - Cardiology (Cardiology) Jenene Slicker Deberah Castle, MD as Consulting Physician (Optometry) Olegario Shearer, MD as Referring Physician (Dermatology) Loni Muse, MD as Consulting Physician (Internal Medicine)  CHIEF COMPLAINTS/PURPOSE OF CONSULTATION:  Oncology History  Malignant melanoma (HCC)  04/03/2023 Initial Diagnosis   Malignant melanoma (HCC)   04/03/2023 Cancer Staging   Staging form: Melanoma of the Skin, AJCC 8th Edition - Clinical stage from 04/03/2023: Stage IIC (cT4b, cN0, cM0) - Signed by Loni Muse, MD on 04/03/2023 Histopathologic type: Nodular melanoma Stage prefix: Initial diagnosis   05/13/2023 -  Chemotherapy   Patient is on Treatment Plan : MELANOMA Pembrolizumab (200) q21d       HISTORY OF PRESENTING ILLNESS: Jon Nelson 77 y.o. 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  Breslow depth 5.1 mm.  Clark's level 4.  Ulceration present.  Mitotic index 16/mm.  Pathologic stage T4b.  Deep margin involved  Apr 03 2023:  [facility] Medical Oncology Consult Patient here with sister in law Ms Jon Nelson who is also his power of attorney.  He has had a mole on the left lateral leg for years but in the last year it degenerated into a sore which didn't heal which prompted him to see a dermatologist.  He has been followed by dermatology for removal of non-melanomatous skin cancers on his face and arms over the years.  He underwent a shave biopsy of the left leg lesion on March 19 2023.  He has not been noted to have in transit lesions.  April 01 2023 he underwent a wide local excision of the region but did not undergo  inguinal lymphadenectomy.     Social:  Widowed.  Lives with his dog.  Worked for [employer] where he worked outside.  No smoking.  EtOH none  [facility] Mother died 33 dementia Father estranged No siblings.     Apr 12 2023:  MRI brain No evidence of metastatic disease. 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  Apr 14 2023:  Whole body PET/CT Postsurgical changes in the lateral left lower leg, as above.  No findings suspicious for metastatic disease.   Apr 29 2023:   Has undergone port placement since last visit.  Has not yet been scheduled for teaching or therapy.  Discussed risks and benefits of immunotherapy    May 06 2023:   Experiencing constipation.  Has urinary frequency but not interested in seeing a urologist.  To undergo chemotherapy teaching today.  Will check PSA  May 13 2023:  Cycle 1 Pembrolizumab  June 03 2023:  Cycle 2 Pembrolizumab  June 24 2023:  Scheduled follow up for melanoma.  LE edema improved.  Does not check FSBG's.  Sister could not come to visit today because she struck a deer while driving.   TSH 0.31 Free T4 11.3. CMP notable for glucose 276 Cycle 3 Pembrolizumab  July 15, 2023: Scheduled follow-up for melanoma.   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)    Old myocardial infarct    Other amnesia    TIA (transient ischemic attack)     SURGICAL HISTORY: Past Surgical History:  Procedure Laterality Date   CATARACT EXTRACTION Left 08/15/2020   CORONARY ARTERY BYPASS GRAFT N/A 09/24/2021   Procedure: CORONARY ARTERY BYPASS GRAFTING (CABG) TIMES THREE , ON PUMP,  USING LEFT INTERNAL MAMMARY ARTERY AND ENDOSCOPICALLY HARVESTED RIGHT GREATER SAPHENOUS VEIN;  Surgeon: Corliss Skains, MD;  Location: [facility] OR;  Service: Open Heart Surgery;  Laterality: N/A;   ENDOVEIN HARVEST OF GREATER SAPHENOUS VEIN Right 09/24/2021   Procedure: ENDOVEIN HARVEST OF GREATER SAPHENOUS VEIN;  Surgeon: Corliss Skains, MD;  Location: [facility] OR;  Service: Open Heart Surgery;  Laterality: Right;   HEMORRHOID SURGERY     HERNIA REPAIR  07/23/2020   LEFT HEART CATH AND CORONARY ANGIOGRAPHY N/A 09/18/2021   Procedure: LEFT HEART CATH AND CORONARY ANGIOGRAPHY;  Surgeon: Lyn Records, MD;  Location: [facility] INVASIVE CV LAB;  Service: Cardiovascular;  Laterality: N/A;   LOOP RECORDER INSERTION N/A 02/03/2019   Procedure: LOOP RECORDER INSERTION;  Surgeon: Regan Lemming, MD;  Location: [facility] INVASIVE CV LAB;  Service: Cardiovascular;  Laterality: N/A;   MOLE REMOVAL     PORTA CATH INSERTION     SKIN CANCER EXCISION     TEE WITHOUT CARDIOVERSION N/A 02/03/2019   Procedure: TRANSESOPHAGEAL ECHOCARDIOGRAM (TEE);  Surgeon: Jake Bathe, MD;  Location: [facility] ENDOSCOPY;  Service: Cardiovascular;  Laterality: N/A;  loop   TEE WITHOUT CARDIOVERSION N/A 09/24/2021   Procedure: TRANSESOPHAGEAL ECHOCARDIOGRAM (TEE);  Surgeon: Corliss Skains, MD;  Location[facility];  Service: Open Heart Surgery;  Laterality: N/A;    SOCIAL HISTORY: Social History   Socioeconomic History   Marital status: Widowed    Spouse name: Not on file   Number of children: 0   Years of education: Not on file   Highest education level: Not on file  Occupational History   Occupation: Retired Brewing technologist)  Tobacco Use   Smoking status: Never   Smokeless tobacco: Never  Vaping Use   Vaping status: Never Used  Substance and Sexual Activity   Alcohol use: Never   Drug use: Never   Sexual activity: Not Currently  Other Topics Concern   Not on file  Social History Narrative   Patient's wife passed away in Jul 24, 2015 due  to cancer - she was unable to have children.  Patient has a sister, his niece and her family lives in his home temporarily    Social Determinants of Health   Financial Resource Strain: Low Risk  (01/16/2022)   Overall Financial Resource Strain (CARDIA)    Difficulty of Paying Living Expenses: Not hard at all  Food Insecurity: No Food Insecurity (04/03/2023)   Hunger Vital Sign    Worried About Running Out of Food in the Last Year: Never true    Ran Out of Food in the Last Year: Never true  Transportation Needs: No Transportation Needs (01/16/2022)   PRAPARE - Administrator, Civil Service (Medical): No    Lack of Transportation (Non-Medical): No  Physical Activity: Not on file  Stress: Not on file  Social Connections: Not on file  Intimate Partner Violence: Not At Risk (04/03/2023)   Humiliation, Afraid, Rape, and Kick questionnaire  Fear of Current or Ex-Partner: No    Emotionally Abused: No    Physically Abused: No    Sexually Abused: No    FAMILY HISTORY Family History  Problem Relation Age of Onset   Stroke Mother    Alzheimer's disease Mother     ALLERGIES:  is allergic to aricept [donepezil].  MEDICATIONS:  Current Outpatient Medications  Medication Sig Dispense Refill   acetaminophen (TYLENOL) 500 MG tablet Take 500 mg by mouth every 6 (six) hours as needed for mild pain or moderate pain. (Patient not taking: Reported on 06/24/2023)     aspirin EC 81 MG EC tablet Take 1 tablet (81 mg total) by mouth daily. Swallow whole. (Patient not taking: Reported on 06/24/2023)     clopidogrel (PLAVIX) 75 MG tablet Take 1 tablet (75 mg total) by mouth daily. 30 tablet 1   finasteride (PROSCAR) 5 MG tablet Take 1 tablet (5 mg total) by mouth daily. (Patient not taking: Reported on 06/24/2023) 30 tablet 5   meloxicam (MOBIC) 15 MG tablet Take 15 mg by mouth daily. (Patient not taking: Reported on 06/24/2023)     metFORMIN (GLUCOPHAGE) 500 MG tablet Take 1 tablet (500 mg total)  by mouth every morning. 90 tablet 2   metoprolol tartrate (LOPRESSOR) 25 MG tablet Take 0.5 tablets (12.5 mg total) by mouth 2 (two) times daily. (Patient not taking: Reported on 06/24/2023) 30 tablet 1   ondansetron (ZOFRAN) 8 MG tablet Take 1 tablet (8 mg total) by mouth every 8 (eight) hours as needed for nausea or vomiting. (Patient not taking: Reported on 06/24/2023) 30 tablet 1   prochlorperazine (COMPAZINE) 10 MG tablet Take 1 tablet (10 mg total) by mouth every 6 (six) hours as needed for nausea or vomiting. (Patient not taking: Reported on 06/24/2023) 30 tablet 1   rosuvastatin (CRESTOR) 40 MG tablet Take 1 tablet (40 mg total) by mouth daily. (Patient not taking: Reported on 06/24/2023) 90 tablet 1   traMADol (ULTRAM) 50 MG tablet Take 50 mg by mouth every 6 (six) hours as needed. (Patient not taking: Reported on 06/24/2023)     No current facility-administered medications for this visit.    PHYSICAL EXAMINATION:  ECOG PERFORMANCE STATUS: 1 - Symptomatic but completely ambulatory   There were no vitals filed for this visit.    There were no vitals filed for this visit.     Physical Exam  LABORATORY DATA: I have personally reviewed the data as listed:  Appointment on 06/24/2023  Component Date Value Ref Range Status   Cortisol, Plasma 06/24/2023 7.4  ug/dL Final   Comment: (NOTE) AM    6.7 - 22.6 ug/dL PM   <13.0       ug/dL Performed at [facility], [address] [place], [place] [address]    Free T4 06/24/2023 2.18 (H)  0.61 - 1.12 ng/dL Final   Comment: (NOTE) Biotin ingestion may interfere with free T4 tests. If the results are inconsistent with the TSH level, previous test results, or the clinical presentation, then consider biotin interference. If needed, order repeat testing after stopping biotin. Performed at [facility], [address], [place], [place] [address]    TSH 06/24/2023 0.025 (L)  0.350 - 4.500 uIU/mL Final   Comment: Performed  by a 3rd Generation assay with a functional sensitivity of <=0.01 uIU/mL. Performed at [facility], [address], [place], [place] [address]    Sodium 06/24/2023 137  135 - 145  mmol/L Final   Potassium 06/24/2023 4.2  3.5 - 5.1 mmol/L Final   Chloride 06/24/2023 105  98 - 111 mmol/L Final   CO2 06/24/2023 23  22 - 32 mmol/L Final   Glucose, Bld 06/24/2023 147 (H)  70 - 99 mg/dL Final   Glucose reference range applies only to samples taken after fasting for at least 8 hours.   BUN 06/24/2023 15  8 - 23 mg/dL Final   Creatinine, Ser 06/24/2023 0.75  0.61 - 1.24 mg/dL Final   Calcium 16/09/9603 9.0  8.9 - 10.3 mg/dL Final   Total Protein 54/08/8118 6.6  6.5 - 8.1 g/dL Final   Albumin 14/78/2956 3.6  3.5 - 5.0 g/dL Final   AST 21/30/8657 19  15 - 41 U/L Final   ALT 06/24/2023 18  0 - 44 U/L Final   Alkaline Phosphatase 06/24/2023 77  38 - 126 U/L Final   Total Bilirubin 06/24/2023 0.6  0.3 - 1.2 mg/dL Final   GFR, Estimated 06/24/2023 >60  >60 mL/min Final   Comment: (NOTE) Calculated using the CKD-EPI Creatinine Equation (2021)    Anion gap 06/24/2023 9  5 - 15 Final   Performed at [facility], [address] [place], [place] [address]   WBC 06/24/2023 10.0  4.0 - 10.5 K/uL Final   RBC 06/24/2023 4.83  4.22 - 5.81 MIL/uL Final   Hemoglobin 06/24/2023 14.3  13.0 - 17.0 g/dL Final   HCT 29/52/8413 44.1  39.0 - 52.0 % Final   MCV 06/24/2023 91.3  80.0 - 100.0 fL Final   MCH 06/24/2023 29.6  26.0 - 34.0 pg Final   MCHC 06/24/2023 32.4  30.0 - 36.0 g/dL Final   RDW 24/40/1027 13.4  11.5 - 15.5 % Final   Platelets 06/24/2023 294  150 - 400 K/uL Final   nRBC 06/24/2023 0.0  0.0 - 0.2 % Final   Neutrophils Relative % 06/24/2023 51  % Final   Neutro Abs 06/24/2023 5.1  1.7 - 7.7 K/uL Final   Lymphocytes Relative 06/24/2023 33  % Final   Lymphs Abs 06/24/2023 3.3  0.7 - 4.0 K/uL Final   Monocytes Relative 06/24/2023 12  % Final   Monocytes Absolute  06/24/2023 1.2 (H)  0.1 - 1.0 K/uL Final   Eosinophils Relative 06/24/2023 3  % Final   Eosinophils Absolute 06/24/2023 0.3  0.0 - 0.5 K/uL Final   Basophils Relative 06/24/2023 0  % Final   Basophils Absolute 06/24/2023 0.0  0.0 - 0.1 K/uL Final   Immature Granulocytes 06/24/2023 1  % Final   Abs Immature Granulocytes 06/24/2023 0.05  0.00 - 0.07 K/uL Final   Performed at [facility], [address], [place], [place] [address]  Appointment on 06/19/2023  Component Date Value Ref Range Status   WBC Count 06/19/2023 10.6 (H)  4.0 - 10.5 K/uL Final   RBC 06/19/2023 4.66  4.22 - 5.81 MIL/uL Final   Hemoglobin 06/19/2023 13.8  13.0 - 17.0 g/dL Final   HCT 44/01/4741 41.5  39.0 - 52.0 % Final   MCV 06/19/2023 89.1  80.0 - 100.0 fL Final   MCH 06/19/2023 29.6  26.0 - 34.0 pg Final   MCHC 06/19/2023 33.3  30.0 - 36.0 g/dL Final   RDW 59/56/3875 13.5  11.5 - 15.5 % Final   Platelet Count 06/19/2023 318  150 - 400 K/uL Final   nRBC 06/19/2023 0.0  0.0 - 0.2 % Final   Neutrophils Relative %  06/19/2023 56  % Final   Neutro Abs 06/19/2023 5.9  1.7 - 7.7 K/uL Final   Lymphocytes Relative 06/19/2023 29  % Final   Lymphs Abs 06/19/2023 3.1  0.7 - 4.0 K/uL Final   Monocytes Relative 06/19/2023 10  % Final   Monocytes Absolute 06/19/2023 1.1 (H)  0.1 - 1.0 K/uL Final   Eosinophils Relative 06/19/2023 4  % Final   Eosinophils Absolute 06/19/2023 0.4  0.0 - 0.5 K/uL Final   Basophils Relative 06/19/2023 0  % Final   Basophils Absolute 06/19/2023 0.0  0.0 - 0.1 K/uL Final   Immature Granulocytes 06/19/2023 1  % Final   Abs Immature Granulocytes 06/19/2023 0.07  0.00 - 0.07 K/uL Final   Performed at [facility], [address] [place], [place] [address]   Sodium 06/19/2023 136  135 - 145 mmol/L Final   Potassium 06/19/2023 4.0  3.5 - 5.1 mmol/L Final   Chloride 06/19/2023 104  98 - 111 mmol/L Final   CO2 06/19/2023 22  22 - 32 mmol/L Final   Glucose, Bld  06/19/2023 276 (H)  70 - 99 mg/dL Final   Glucose reference range applies only to samples taken after fasting for at least 8 hours.   BUN 06/19/2023 15  8 - 23 mg/dL Final   Creatinine 19/14/7829 0.80  0.61 - 1.24 mg/dL Final   Calcium 56/21/3086 8.6 (L)  8.9 - 10.3 mg/dL Final   Total Protein 57/84/6962 6.6  6.5 - 8.1 g/dL Final   Albumin 95/28/4132 3.5  3.5 - 5.0 g/dL Final   AST 44/12/270 21  15 - 41 U/L Final   ALT 06/19/2023 19  0 - 44 U/L Final   Alkaline Phosphatase 06/19/2023 82  38 - 126 U/L Final   Total Bilirubin 06/19/2023 0.8  0.3 - 1.2 mg/dL Final   GFR, Estimated 06/19/2023 >60  >60 mL/min Final   Comment: (NOTE) Calculated using the CKD-EPI Creatinine Equation (2021)    Anion gap 06/19/2023 10  5 - 15 Final   Performed at [facility], [address] [place], [place] [address]   T4, Total 06/19/2023 11.3  4.5 - 12.0 ug/dL Final   Comment: (NOTE) Performed At: [facility] [address] [place], [place] [id number] Jolene Schimke MD DG:[id number]    TSH 06/19/2023 0.131 (L)  0.350 - 4.500 uIU/mL Final   Comment: Performed by a 3rd Generation assay with a functional sensitivity of <=0.01 uIU/mL. Performed at [facility], [address] [place], [place] [address]     RADIOGRAPHIC STUDIES: I have personally reviewed the radiological images as listed and agree with the findings in the report  No results found.  ASSESSMENT/PLAN 77 y.o. male is here because of  malignant melanoma.  Medical history notable for diabetes mellitus, coronary artery disease, cerebrovascular disease, MI, cataracts  Malignant melanoma, distal left leg, Stage IIC (T4b N0 M0)- Risk factors for this patient are 1) chronic exposure to UV 2) Sunburns 3) Fitzpatrick Grade 2 skin tone March 19, 2023: Shave biopsy left lateral leg demonstrated malignant melanoma, nodular type. Breslow depth 5.1 mm. Clark's level 4. Ulceration present. Mitotic index  16/mm. Pathologic stage T4b. Deep margin involved  April 01 2023- Wide local excision.  Sentinel LN bx not performed due to patient frailty  Therapeutics   Apr 03 2023- Will obtain CBC with diff, CMP, LDH, MRI brain and whole body PET/CT for staging  Patient meets criteria per NCCN Guidelines for  adjuvant immunotherapy and will plan on adjuvant Pembrolizumab  May 06 2023- Chemotherapy teaching  May 13 2023:  Cycle 1 Pembrolizumab  June 03 2023:  Cycle 2 Pembrolizumab  June 24 2023:  TSH 0.31 Free T4 11.3-- Will follow as this may be due to thyroiditis from Pembrolizumab.   Cycle 3 Pembrolizumab July 17, 2023: Cycle 4 pembrolizumab   Immunotherapy Risks:   May 06 2023- Reiterated potential side effects of immunotherapy with the patient.  These include but are not limited to:  Fatigue, Hair loss, low blood counts (anemia, thrombocytopenia) that may necessitate transfusion, bleeding, infection, nausea/ vomiting/Appetite changes/ Constipation/ Diarrhea, mucositis, Neuropathy/ neurologic problems, skin and nail changes such as dry skin and color change, Urine and bladder changes and kidney problems, weight changes, mood changes, decreased libido/fertility problems, damage to heart and lungs.  Some of these side effects can be life threatening, may be permanent and can result in hospitalization and/or death.  In shared decision making patient has agreed to proceed with immunotherapy.    Poor IV access  Apr 29 2023- Has undergone portacath placement  Urinary frequency  May 06 2023- Likely secondary to BPH.  PSA 7.11 and began Proscar.    June 24 2023- Urinary frequency improved.  May be due to BPH with contribution of hyperglycemia  Elevated PSA  May 06 2023- PSA 7.11  June 24 2023- Referring to urology for evaluation     Cancer Staging  Malignant melanoma [facility]) Staging form: Melanoma of the Skin, AJCC 8th Edition - Clinical stage from 04/03/2023: Stage IIC (cT4b, cN0, cM0) - Signed by  Loni Muse, MD on 04/03/2023 Histopathologic type: Nodular melanoma Stage prefix: Initial diagnosis    No problem-specific Assessment & Plan notes found for this encounter.    No orders of the defined types were placed in this encounter.   30 minutes was spent in patient care.  This included time spent preparing to see the patient (e.g., review of tests), obtaining and/or reviewing separately obtained history, counseling and educating the patient/family/caregiver, ordering medications, tests, or procedures; documenting clinical information in the electronic or other health record, independently interpreting results and communicating results to the patient/family/caregiver as well as coordination of care.       All questions were answered. The patient knows to call the clinic with any problems, questions or concerns.  This note was electronically signed.    Adah Perl, PA-C  07/15/2023 1:41 PM

## 2023-07-16 ENCOUNTER — Inpatient Hospital Stay (INDEPENDENT_AMBULATORY_CARE_PROVIDER_SITE_OTHER): Payer: Medicare HMO | Admitting: Hematology and Oncology

## 2023-07-16 ENCOUNTER — Encounter: Payer: Self-pay | Admitting: Hematology and Oncology

## 2023-07-16 ENCOUNTER — Encounter: Payer: Self-pay | Admitting: Oncology

## 2023-07-16 ENCOUNTER — Other Ambulatory Visit: Payer: Medicare HMO

## 2023-07-16 ENCOUNTER — Inpatient Hospital Stay: Payer: Medicare HMO

## 2023-07-16 DIAGNOSIS — C4372 Malignant melanoma of left lower limb, including hip: Secondary | ICD-10-CM

## 2023-07-16 NOTE — Progress Notes (Signed)
[facility] Cancer Initial Visit:  Patient Care Team: Gus Height, [place] as PCP - General (Family Medicine) [name], MD as PCP - Cardiology (Cardiology) Jenene Slicker Deberah Castle, MD as Consulting Physician (Optometry) Olegario Shearer, MD as Referring Physician (Dermatology) Loni Muse, MD as Consulting Physician (Internal Medicine)  CHIEF COMPLAINTS/PURPOSE OF CONSULTATION:  Oncology History  Malignant melanoma (HCC)  04/03/2023 Initial Diagnosis   Malignant melanoma (HCC)   04/03/2023 Cancer Staging   Staging form: Melanoma of the Skin, AJCC 8th Edition - Clinical stage from 04/03/2023: Stage IIC (cT4b, cN0, cM0) - Signed by Loni Muse, MD on 04/03/2023 Histopathologic type: Nodular melanoma Stage prefix: Initial diagnosis   05/13/2023 -  Chemotherapy   Patient is on Treatment Plan : MELANOMA Pembrolizumab (200) q21d       HISTORY OF PRESENTING ILLNESS: Jon Nelson 77 y.o. 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  Breslow depth 5.1 mm.  Clark's level 4.  Ulceration present.  Mitotic index 16/mm.  Pathologic stage T4b.  Deep margin involved  Apr 03 2023:  [facility] Oncology Consult Patient here with sister in law [name] who is also his power of attorney.  He has had a mole on the left lateral leg for years but in the last year it degenerated into a sore which didn't heal which prompted him to see a dermatologist.  He has been followed by dermatology for removal of non-melanomatous skin cancers on his face and arms over the years.  He underwent a shave biopsy of the left leg lesion on March 19 2023.  He has not been noted to have in transit lesions.  April 01 2023 he underwent a wide local excision of the region but did not undergo  inguinal lymphadenectomy.     Social:  Widowed.  Lives with his dog.  Worked for [employer] where he worked outside.  No smoking.  EtOH none  [facility] Mother died 55 dementia Father estranged No siblings.     Apr 12 2023:  MRI brain No evidence of metastatic disease. 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  Apr 14 2023:  Whole body PET/CT Postsurgical changes in the lateral left lower leg, as above.  No findings suspicious for metastatic disease.   Apr 29 2023:   Has undergone port placement since last visit.  Has not yet been scheduled for teaching or therapy.  Discussed risks and benefits of immunotherapy    May 06 2023:   Experiencing constipation.  Has urinary frequency but not interested in seeing a urologist.  To undergo chemotherapy teaching today.  Will check PSA  May 13 2023:  Cycle 1 Pembrolizumab  June 03 2023:  Cycle 2 Pembrolizumab  June 24 2023:  Scheduled follow up for melanoma.  LE edema improved.  Does not check FSBG's.  Sister could not come to visit today because she struck a deer while driving.   TSH 0.31 Free T4 11.3. CMP notable for glucose 276 Cycle 3 Pembrolizumab  June 30, 2023:  Review of the records reveals patient was seen [facility] with report of altered mental status and abnormal gait, which resolved spontaneously.   CT head:  Generalized cerebral atrophy no acute abnormality.  Bilateral carotid ultrasound: Possible stenosis. CTA recommended.  MRI  head: No evidence of acute intracranial abnormality.  Remote left posterior frontal cortical infarct and moderate chronic microvascular ischemic changes.  CTA head/neck: No emergent vascular finding.  Widespread atherosclerosis with up to 50% left ICA bulb and left V4 segment stenosis with moderate heterogeneity of plaque of the left ICA bulb.  Atheromatous plaque at both vertebral origins with 40% stenosis on the left and mild stenosis on the right.  Nearly 50%  narrowing of the left vertebral artery at the dural penetration.  Diagnosis of TIA by neurology.  Dual therapy with low-dose aspirin and Plavix recommended.   Echocardiogram normal sinus rhythm.  Left ventricular size normal.  Left ventricular diastolic function has impaired relaxation.  Left ventricular ejection fraction is 55 to 60%.  Right ventricular size and function normal.  Mild mitral valve annular calcification and thickening.  Trivial mitral regurgitation.  Mild aortic valve sclerosis without stenosis.  Trace pulmonic valve regurgitation.  Mild tricuspid valve regurgitation.    TSH less than 0.02.  Free T4 3.42.  Free T3 6.47.  Methimazole 5 mg twice daily started as inpatient.  WBC 8.7.  Hemoglobin 14.  MCV 87.  Platelets 253.  B12 254.  Folate 19.5.  Sodium 131.  Uncorrected calcium 8.1.  Glucose 148.  BUN 15.  Creatinine 0.5.  Ammonia less than 9.  PT 12.7.  Hemoglobin A1c 7.4.    July 15, 2023: Scheduled follow-up for melanoma.  He was not forthcoming regarding his recent hospitalization.  He states he is taking Plavix, but not aspirin.  He is also on methimazole 5 mg twice daily.  Only complaint is fatigue.  Denies recurrent neurologic signs or symptoms.  Per his sister-in-law, follow-up with neurology in October.  History of stroke in 2020.   Review of Systems  Constitutional:  Positive for fatigue. Negative for appetite change, chills, fever and unexpected weight change.  HENT:   Negative for lump/mass, mouth sores, sore throat and trouble swallowing.   Eyes:  Negative for eye problems.  Respiratory:  Negative for cough and shortness of breath.   Cardiovascular:  Negative for chest pain and leg swelling.  Gastrointestinal:  Negative for abdominal pain, constipation, diarrhea, nausea and vomiting.  Genitourinary:  Negative for difficulty urinating, dysuria, frequency and hematuria.   Musculoskeletal:  Negative for arthralgias, back pain, gait problem and myalgias.  Skin:   Negative for itching, rash and wound.  Neurological:  Negative for dizziness, extremity weakness, gait problem, headaches, light-headedness and numbness.  Hematological:  Negative for adenopathy. Does not bruise/bleed easily.  Psychiatric/Behavioral:  Negative for depression and sleep disturbance. The patient is not nervous/anxious.    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)    Old myocardial infarct    Other amnesia    TIA (transient ischemic attack)     SURGICAL HISTORY: Past Surgical History:  Procedure Laterality Date   CATARACT EXTRACTION Left 08/15/2020   CORONARY ARTERY BYPASS GRAFT N/A 09/24/2021   Procedure: CORONARY ARTERY BYPASS GRAFTING (CABG) TIMES THREE , ON PUMP, USING LEFT INTERNAL MAMMARY ARTERY AND ENDOSCOPICALLY HARVESTED RIGHT GREATER SAPHENOUS VEIN;  Surgeon: Corliss Skains, MD;  Location: [facility] OR;  Service: Open Heart Surgery;  Laterality: N/A;   ENDOVEIN HARVEST OF GREATER SAPHENOUS VEIN Right 09/24/2021   Procedure: ENDOVEIN HARVEST OF GREATER SAPHENOUS VEIN;  Surgeon: Corliss Skains, MD;  Location: [facility] OR;  Service: Open Heart Surgery;  Laterality: Right;   HEMORRHOID SURGERY     HERNIA REPAIR  Aug 18, 2020   LEFT HEART CATH AND CORONARY ANGIOGRAPHY N/A 09/18/2021   Procedure: LEFT HEART CATH AND CORONARY ANGIOGRAPHY;  Surgeon: Lyn Records, MD;  Location: [facility] INVASIVE CV LAB;  Service: Cardiovascular;  Laterality: N/A;   LOOP RECORDER INSERTION N/A 02/03/2019   Procedure: LOOP RECORDER INSERTION;  Surgeon: Regan Lemming, MD;  Location: [facility] INVASIVE CV LAB;  Service: Cardiovascular;  Laterality: N/A;   MOLE REMOVAL     PORTA CATH  INSERTION     SKIN CANCER EXCISION     TEE WITHOUT CARDIOVERSION N/A 02/03/2019   Procedure: TRANSESOPHAGEAL ECHOCARDIOGRAM (TEE);  Surgeon: Jake Bathe, MD;  Location: [facility] ENDOSCOPY;  Service: Cardiovascular;  Laterality: N/A;  loop   TEE WITHOUT CARDIOVERSION N/A 09/24/2021   Procedure: TRANSESOPHAGEAL ECHOCARDIOGRAM (TEE);  Surgeon: Corliss Skains, MD;  Location: [facility] OR;  Service: Open Heart Surgery;  Laterality: N/A;    SOCIAL HISTORY: Social History   Socioeconomic History   Marital status: Widowed    Spouse name: Not on file   Number of children: 0   Years of education: Not on file   Highest education level: Not on file  Occupational History   Occupation: Retired Brewing technologist)  Tobacco Use   Smoking status: Never   Smokeless tobacco: Never  Vaping Use   Vaping status: Never Used  Substance and Sexual Activity   Alcohol use: Never   Drug use: Never   Sexual activity: Not Currently  Other Topics Concern   Not on file  Social History Narrative   Patient's wife passed away in 2015-08-19 due to cancer - she was unable to have children.  Patient has a sister, his niece and her family lives in his home temporarily    Social Determinants of Health   Financial Resource Strain: Low Risk  (01/16/2022)   Overall Financial Resource Strain (CARDIA)    Difficulty of Paying Living Expenses: Not hard at all  Food Insecurity: No Food Insecurity (04/03/2023)   Hunger Vital Sign    Worried About Running Out of Food in the Last Year: Never true    Ran Out of Food in the Last Year: Never true  Transportation Needs: No Transportation Needs (01/16/2022)   PRAPARE - Administrator, Civil Service (Medical): No    Lack of Transportation (Non-Medical): No  Physical Activity: Not on file  Stress: Not on file  Social Connections: Not on file  Intimate Partner Violence: Not At Risk (04/03/2023)   Humiliation, Afraid, Rape, and Kick questionnaire    Fear of Current or Ex-Partner: No     Emotionally Abused: No    Physically Abused: No    Sexually Abused: No    FAMILY HISTORY Family History  Problem Relation Age of Onset   Stroke Mother    Alzheimer's disease Mother     ALLERGIES:  is allergic to aricept [donepezil].  MEDICATIONS:  Current Outpatient Medications  Medication Sig Dispense Refill   finasteride (PROSCAR) 5 MG tablet Take 1 tablet (5 mg total) by mouth daily. 30 tablet 5   meloxicam (MOBIC) 15 MG tablet Take 15 mg by mouth daily.     metFORMIN (GLUCOPHAGE) 1000 MG tablet  Take 1,000 mg by mouth 2 (two) times daily.     methimazole (TAPAZOLE) 5 MG tablet Take 5 mg by mouth 2 (two) times daily.     metoprolol tartrate (LOPRESSOR) 25 MG tablet Take 0.5 tablets (12.5 mg total) by mouth 2 (two) times daily. 30 tablet 1   rosuvastatin (CRESTOR) 20 MG tablet Take 20 mg by mouth daily.     acetaminophen (TYLENOL) 500 MG tablet Take 500 mg by mouth every 6 (six) hours as needed for mild pain or moderate pain. (Patient not taking: Reported on 06/24/2023)     aspirin EC 81 MG EC tablet Take 1 tablet (81 mg total) by mouth daily. Swallow whole. (Patient not taking: Reported on 06/24/2023)     clopidogrel (PLAVIX) 75 MG tablet Take 1 tablet (75 mg total) by mouth daily. 30 tablet 1   ondansetron (ZOFRAN) 8 MG tablet Take 1 tablet (8 mg total) by mouth every 8 (eight) hours as needed for nausea or vomiting. (Patient not taking: Reported on 06/24/2023) 30 tablet 1   prochlorperazine (COMPAZINE) 10 MG tablet Take 1 tablet (10 mg total) by mouth every 6 (six) hours as needed for nausea or vomiting. (Patient not taking: Reported on 06/24/2023) 30 tablet 1   traMADol (ULTRAM) 50 MG tablet Take 50 mg by mouth every 6 (six) hours as needed. (Patient not taking: Reported on 06/24/2023)     No current facility-administered medications for this visit.    PHYSICAL EXAMINATION:  ECOG PERFORMANCE STATUS: 1 - Symptomatic but completely ambulatory   Vitals:   07/16/23 1108  BP:  121/79  Pulse: 73  Resp: 20  Temp: (!) 97.5 F (36.4 C)  SpO2: 99%      Filed Weights   07/16/23 1108  Weight: 176 lb 12.8 oz (80.2 kg)       Physical Exam Constitutional:      General: He is not in acute distress.    Appearance: He is not ill-appearing.  HENT:     Head: Normocephalic and atraumatic.     Mouth/Throat:     Mouth: Mucous membranes are moist.     Pharynx: Oropharynx is clear. No oropharyngeal exudate or posterior oropharyngeal erythema.  Eyes:     Extraocular Movements: Extraocular movements intact.     Conjunctiva/sclera: Conjunctivae normal.     Pupils: Pupils are equal, round, and reactive to light.  Cardiovascular:     Rate and Rhythm: Normal rate and regular rhythm.     Heart sounds: Normal heart sounds. No murmur heard.    No friction rub. No gallop.  Pulmonary:     Effort: Pulmonary effort is normal. No respiratory distress.     Breath sounds: No stridor. No wheezing, rhonchi or rales.  Abdominal:     General: Bowel sounds are normal. There is no distension.     Palpations: Abdomen is soft. There is no mass.     Tenderness: There is no abdominal tenderness.  Musculoskeletal:        General: Normal range of motion.     Cervical back: No rigidity or tenderness.     Right lower leg: No edema.     Left lower leg: No edema.  Lymphadenopathy:     Cervical: No cervical adenopathy.  Skin:    General: Skin is warm and dry.     Findings: No erythema, lesion or rash.  Neurological:     Mental Status: He is alert and oriented to person, place, and time.  Cranial Nerves: No cranial nerve deficit.  Psychiatric:        Mood and Affect: Mood normal.        Thought Content: Thought content normal.   LABORATORY DATA: I have personally reviewed the data as listed:    Appointment on 06/24/2023  Component Date Value Ref Range Status   Cortisol, Plasma 06/24/2023 7.4  ug/dL Final   Comment: (NOTE) AM    6.7 - 22.6 ug/dL PM   <16.1        ug/dL Performed at [facility], [address] [place], [place] [address]    Free T4 06/24/2023 2.18 (H)  0.61 - 1.12 ng/dL Final   Comment: (NOTE) Biotin ingestion may interfere with free T4 tests. If the results are inconsistent with the TSH level, previous test results, or the clinical presentation, then consider biotin interference. If needed, order repeat testing after stopping biotin. Performed at [facility], [address], [place], [place] [address]    TSH 06/24/2023 0.025 (L)  0.350 - 4.500 uIU/mL Final   Comment: Performed by a 3rd Generation assay with a functional sensitivity of <=0.01 uIU/mL. Performed at [facility], [address], [place], [place] [address]    Sodium 06/24/2023 137  135 - 145 mmol/L Final   Potassium 06/24/2023 4.2  3.5 - 5.1 mmol/L Final   Chloride 06/24/2023 105  98 - 111 mmol/L Final   CO2 06/24/2023 23  22 - 32 mmol/L Final   Glucose, Bld 06/24/2023 147 (H)  70 - 99 mg/dL Final   Glucose reference range applies only to samples taken after fasting for at least 8 hours.   BUN 06/24/2023 15  8 - 23 mg/dL Final   Creatinine, Ser 06/24/2023 0.75  0.61 - 1.24 mg/dL Final   Calcium 78/29/5621 9.0  8.9 - 10.3 mg/dL Final   Total Protein 30/86/5784 6.6  6.5 - 8.1 g/dL Final   Albumin 69/62/9528 3.6  3.5 - 5.0 g/dL Final   AST 41/32/4401 19  15 - 41 U/L Final   ALT 06/24/2023 18  0 - 44 U/L Final   Alkaline Phosphatase 06/24/2023 77  38 - 126 U/L Final   Total Bilirubin 06/24/2023 0.6  0.3 - 1.2 mg/dL Final   GFR, Estimated 06/24/2023 >60  >60 mL/min Final   Comment: (NOTE) Calculated using the CKD-EPI Creatinine Equation (2021)    Anion gap 06/24/2023 9  5 - 15 Final   Performed at [facility], [address], [place], [place] [address]   WBC 06/24/2023 10.0  4.0 - 10.5 K/uL Final   RBC 06/24/2023 4.83  4.22 - 5.81 MIL/uL Final   Hemoglobin 06/24/2023 14.3  13.0 - 17.0 g/dL Final   HCT  36/64/4034 44.1  39.0 - 52.0 % Final   MCV 06/24/2023 91.3  80.0 - 100.0 fL Final   MCH 06/24/2023 29.6  26.0 - 34.0 pg Final   MCHC 06/24/2023 32.4  30.0 - 36.0 g/dL Final   RDW 74/25/9563 13.4  11.5 - 15.5 % Final   Platelets 06/24/2023 294  150 - 400 K/uL Final   nRBC 06/24/2023 0.0  0.0 - 0.2 % Final   Neutrophils Relative % 06/24/2023 51  % Final   Neutro Abs 06/24/2023 5.1  1.7 - 7.7 K/uL Final   Lymphocytes Relative 06/24/2023 33  % Final   Lymphs Abs 06/24/2023 3.3  0.7 - 4.0 K/uL Final   Monocytes Relative 06/24/2023 12  %  Final   Monocytes Absolute 06/24/2023 1.2 (H)  0.1 - 1.0 K/uL Final   Eosinophils Relative 06/24/2023 3  % Final   Eosinophils Absolute 06/24/2023 0.3  0.0 - 0.5 K/uL Final   Basophils Relative 06/24/2023 0  % Final   Basophils Absolute 06/24/2023 0.0  0.0 - 0.1 K/uL Final   Immature Granulocytes 06/24/2023 1  % Final   Abs Immature Granulocytes 06/24/2023 0.05  0.00 - 0.07 K/uL Final   Performed at [facility], [address] [place], [place] [address]  Appointment on 06/19/2023  Component Date Value Ref Range Status   WBC Count 06/19/2023 10.6 (H)  4.0 - 10.5 K/uL Final   RBC 06/19/2023 4.66  4.22 - 5.81 MIL/uL Final   Hemoglobin 06/19/2023 13.8  13.0 - 17.0 g/dL Final   HCT 52/77/8242 41.5  39.0 - 52.0 % Final   MCV 06/19/2023 89.1  80.0 - 100.0 fL Final   MCH 06/19/2023 29.6  26.0 - 34.0 pg Final   MCHC 06/19/2023 33.3  30.0 - 36.0 g/dL Final   RDW 35/36/1443 13.5  11.5 - 15.5 % Final   Platelet Count 06/19/2023 318  150 - 400 K/uL Final   nRBC 06/19/2023 0.0  0.0 - 0.2 % Final   Neutrophils Relative % 06/19/2023 56  % Final   Neutro Abs 06/19/2023 5.9  1.7 - 7.7 K/uL Final   Lymphocytes Relative 06/19/2023 29  % Final   Lymphs Abs 06/19/2023 3.1  0.7 - 4.0 K/uL Final   Monocytes Relative 06/19/2023 10  % Final   Monocytes Absolute 06/19/2023 1.1 (H)  0.1 - 1.0 K/uL Final   Eosinophils Relative 06/19/2023 4  % Final    Eosinophils Absolute 06/19/2023 0.4  0.0 - 0.5 K/uL Final   Basophils Relative 06/19/2023 0  % Final   Basophils Absolute 06/19/2023 0.0  0.0 - 0.1 K/uL Final   Immature Granulocytes 06/19/2023 1  % Final   Abs Immature Granulocytes 06/19/2023 0.07  0.00 - 0.07 K/uL Final   Performed at [facility], [address] [place], [place] [address]   Sodium 06/19/2023 136  135 - 145 mmol/L Final   Potassium 06/19/2023 4.0  3.5 - 5.1 mmol/L Final   Chloride 06/19/2023 104  98 - 111 mmol/L Final   CO2 06/19/2023 22  22 - 32 mmol/L Final   Glucose, Bld 06/19/2023 276 (H)  70 - 99 mg/dL Final   Glucose reference range applies only to samples taken after fasting for at least 8 hours.   BUN 06/19/2023 15  8 - 23 mg/dL Final   Creatinine 86/76/1950 0.80  0.61 - 1.24 mg/dL Final   Calcium 93/26/7124 8.6 (L)  8.9 - 10.3 mg/dL Final   Total Protein 58/08/9832 6.6  6.5 - 8.1 g/dL Final   Albumin 82/50/5397 3.5  3.5 - 5.0 g/dL Final   AST 67/34/1937 21  15 - 41 U/L Final   ALT 06/19/2023 19  0 - 44 U/L Final   Alkaline Phosphatase 06/19/2023 82  38 - 126 U/L Final   Total Bilirubin 06/19/2023 0.8  0.3 - 1.2 mg/dL Final   GFR, Estimated 06/19/2023 >60  >60 mL/min Final   Comment: (NOTE) Calculated using the CKD-EPI Creatinine Equation (2021)    Anion gap 06/19/2023 10  5 - 15 Final   Performed at [facility], [address], [place], [place] [address]   T4, Total 06/19/2023 11.3  4.5 - 12.0 ug/dL Final  Comment: (NOTE) Performed At: [facility] [address] [place], [place] [id number] Jolene Schimke MD MW:[id number]    TSH 06/19/2023 0.131 (L)  0.350 - 4.500 uIU/mL Final   Comment: Performed by a 3rd Generation assay with a functional sensitivity of <=0.01 uIU/mL. Performed at [facility], [address] [place], [place] [address]     RADIOGRAPHIC STUDIES: as above  ASSESSMENT/PLAN 78 y.o. male is here because of   malignant melanoma.  Medical history notable for diabetes mellitus, coronary artery disease, cerebrovascular disease, MI, cataracts  Malignant melanoma, distal left leg, Stage IIC (T4b N0 M0)- Risk factors for this patient are 1) chronic exposure to UV 2) Sunburns 3) Fitzpatrick Grade 2 skin tone March 19, 2023: Shave biopsy left lateral leg demonstrated malignant melanoma, nodular type. Breslow depth 5.1 mm. Clark's level 4. Ulceration present. Mitotic index 16/mm. Pathologic stage T4b. Deep margin involved  April 01 2023- Wide local excision.  Sentinel LN bx not performed due to patient frailty  Therapeutics   Apr 03 2023- Will obtain CBC with diff, CMP, LDH, MRI brain and whole body PET/CT for staging  Patient meets criteria per NCCN Guidelines for adjuvant immunotherapy and will plan on adjuvant Pembrolizumab  May 06 2023- Chemotherapy teaching  May 13 2023:  Cycle 1 Pembrolizumab  June 03 2023:  Cycle 2 Pembrolizumab  June 24 2023:  TSH 0.31 Free T4 11.3-- Will follow as this may be due to thyroiditis from Pembrolizumab.   Cycle 3 Pembrolizumab July 16, 2023: Methimazole 5 mg twice daily started by the hospitalist Will hold pembrolizumab to investigate possibility of neurologic signs and symptoms to be related to treatment.  Plavix and low-dose aspirin as per neurology.   Immunotherapy Risks:   May 06 2023- Reiterated potential side effects of immunotherapy with the patient.  These include but are not limited to:  Fatigue, Hair loss, low blood counts (anemia, thrombocytopenia) that may necessitate transfusion, bleeding, infection, nausea/ vomiting/Appetite changes/ Constipation/ Diarrhea, mucositis, Neuropathy/ neurologic problems, skin and nail changes such as dry skin and color change, Urine and bladder changes and kidney problems, weight changes, mood changes, decreased libido/fertility problems, damage to heart and lungs.  Some of these side effects can be life threatening, may be  permanent and can result in hospitalization and/or death.  In shared decision making patient has agreed to proceed with immunotherapy.    Poor IV access  Apr 29 2023- Has undergone portacath placement  Urinary frequency  May 06 2023- Likely secondary to BPH.  PSA 7.11 and began Proscar.    June 24 2023- Urinary frequency improved.  May be due to BPH with contribution of hyperglycemia  Elevated PSA  May 06 2023- PSA 7.11  June 24 2023- Referring to urology for evaluation     Cancer Staging  Malignant melanoma [facility]) Staging form: Melanoma of the Skin, AJCC 8th Edition - Clinical stage from 04/03/2023: Stage IIC (cT4b, cN0, cM0) - Signed by Loni Muse, MD on 04/03/2023 Histopathologic type: Nodular melanoma Stage prefix: Initial diagnosis    No problem-specific Assessment & Plan notes found for this encounter.    No orders of the defined types were placed in this encounter.   30 minutes was spent in patient care.  This included time spent preparing to see the patient (e.g., review of tests), obtaining and/or reviewing separately obtained history, counseling and educating the patient/family/caregiver, ordering medications, tests, or procedures; documenting clinical information in the electronic or other health record, independently interpreting results  and communicating results to the patient/family/caregiver as well as coordination of care.       All questions were answered. The patient knows to call the clinic with any problems, questions or concerns.  This note was electronically signed.    Adah Perl, PA-C  07/16/2023 11:21 AM

## 2023-07-17 ENCOUNTER — Ambulatory Visit: Payer: Medicare HMO

## 2023-07-18 ENCOUNTER — Encounter: Payer: Self-pay | Admitting: Oncology

## 2023-07-24 ENCOUNTER — Telehealth: Payer: Self-pay

## 2023-07-24 NOTE — Telephone Encounter (Signed)
[name] notified and voiced understanding.

## 2023-07-24 NOTE — Telephone Encounter (Signed)
-----   Message from Adah Perl sent at 07/23/2023  3:55 PM EDT ----- Please let him know Dr. Elvera Lennox wants him to stop methimazole. Keep appt next week. Thanks

## 2023-07-31 ENCOUNTER — Inpatient Hospital Stay (INDEPENDENT_AMBULATORY_CARE_PROVIDER_SITE_OTHER): Payer: Medicare HMO | Admitting: Oncology

## 2023-07-31 ENCOUNTER — Encounter: Payer: Self-pay | Admitting: Oncology

## 2023-07-31 ENCOUNTER — Inpatient Hospital Stay: Payer: Medicare HMO

## 2023-07-31 VITALS — BP 102/65 | HR 85 | Temp 98.2°F | Resp 16 | Ht 69.0 in | Wt 177.5 lb

## 2023-07-31 DIAGNOSIS — I6522 Occlusion and stenosis of left carotid artery: Secondary | ICD-10-CM | POA: Diagnosis not present

## 2023-07-31 DIAGNOSIS — I252 Old myocardial infarction: Secondary | ICD-10-CM | POA: Diagnosis not present

## 2023-07-31 DIAGNOSIS — E064 Drug-induced thyroiditis: Secondary | ICD-10-CM

## 2023-07-31 DIAGNOSIS — C4372 Malignant melanoma of left lower limb, including hip: Secondary | ICD-10-CM

## 2023-07-31 DIAGNOSIS — Z8719 Personal history of other diseases of the digestive system: Secondary | ICD-10-CM | POA: Diagnosis not present

## 2023-07-31 DIAGNOSIS — R350 Frequency of micturition: Secondary | ICD-10-CM | POA: Diagnosis not present

## 2023-07-31 DIAGNOSIS — Z79899 Other long term (current) drug therapy: Secondary | ICD-10-CM | POA: Diagnosis not present

## 2023-07-31 DIAGNOSIS — K5900 Constipation, unspecified: Secondary | ICD-10-CM | POA: Diagnosis not present

## 2023-07-31 DIAGNOSIS — Z8673 Personal history of transient ischemic attack (TIA), and cerebral infarction without residual deficits: Secondary | ICD-10-CM | POA: Diagnosis not present

## 2023-07-31 DIAGNOSIS — R972 Elevated prostate specific antigen [PSA]: Secondary | ICD-10-CM | POA: Diagnosis not present

## 2023-07-31 DIAGNOSIS — Z09 Encounter for follow-up examination after completed treatment for conditions other than malignant neoplasm: Secondary | ICD-10-CM | POA: Diagnosis not present

## 2023-07-31 DIAGNOSIS — Z7902 Long term (current) use of antithrombotics/antiplatelets: Secondary | ICD-10-CM | POA: Diagnosis not present

## 2023-07-31 DIAGNOSIS — F05 Delirium due to known physiological condition: Secondary | ICD-10-CM

## 2023-07-31 DIAGNOSIS — E119 Type 2 diabetes mellitus without complications: Secondary | ICD-10-CM | POA: Diagnosis not present

## 2023-07-31 DIAGNOSIS — Z823 Family history of stroke: Secondary | ICD-10-CM | POA: Diagnosis not present

## 2023-07-31 DIAGNOSIS — Z818 Family history of other mental and behavioral disorders: Secondary | ICD-10-CM | POA: Diagnosis not present

## 2023-07-31 DIAGNOSIS — I2510 Atherosclerotic heart disease of native coronary artery without angina pectoris: Secondary | ICD-10-CM | POA: Diagnosis not present

## 2023-07-31 DIAGNOSIS — E782 Mixed hyperlipidemia: Secondary | ICD-10-CM | POA: Diagnosis not present

## 2023-07-31 DIAGNOSIS — I081 Rheumatic disorders of both mitral and tricuspid valves: Secondary | ICD-10-CM | POA: Diagnosis not present

## 2023-07-31 DIAGNOSIS — I6782 Cerebral ischemia: Secondary | ICD-10-CM | POA: Diagnosis not present

## 2023-07-31 DIAGNOSIS — Z951 Presence of aortocoronary bypass graft: Secondary | ICD-10-CM | POA: Diagnosis not present

## 2023-07-31 DIAGNOSIS — Z888 Allergy status to other drugs, medicaments and biological substances status: Secondary | ICD-10-CM | POA: Diagnosis not present

## 2023-07-31 DIAGNOSIS — R600 Localized edema: Secondary | ICD-10-CM | POA: Diagnosis not present

## 2023-07-31 LAB — CBC WITH DIFFERENTIAL/PLATELET
Abs Immature Granulocytes: 0.05 10*3/uL (ref 0.00–0.07)
Basophils Absolute: 0.1 10*3/uL (ref 0.0–0.1)
Basophils Relative: 1 %
Eosinophils Absolute: 0.3 10*3/uL (ref 0.0–0.5)
Eosinophils Relative: 3 %
HCT: 43.1 % (ref 39.0–52.0)
Hemoglobin: 14.3 g/dL (ref 13.0–17.0)
Immature Granulocytes: 1 %
Lymphocytes Relative: 34 %
Lymphs Abs: 3.5 10*3/uL (ref 0.7–4.0)
MCH: 29.1 pg (ref 26.0–34.0)
MCHC: 33.2 g/dL (ref 30.0–36.0)
MCV: 87.6 fL (ref 80.0–100.0)
Monocytes Absolute: 1.1 10*3/uL — ABNORMAL HIGH (ref 0.1–1.0)
Monocytes Relative: 11 %
Neutro Abs: 5.1 10*3/uL (ref 1.7–7.7)
Neutrophils Relative %: 50 %
Platelets: 332 10*3/uL (ref 150–400)
RBC: 4.92 MIL/uL (ref 4.22–5.81)
RDW: 13.5 % (ref 11.5–15.5)
WBC: 10.1 10*3/uL (ref 4.0–10.5)
nRBC: 0 % (ref 0.0–0.2)

## 2023-07-31 LAB — COMPREHENSIVE METABOLIC PANEL
ALT: 13 U/L (ref 0–44)
AST: 15 U/L (ref 15–41)
Albumin: 3.3 g/dL — ABNORMAL LOW (ref 3.5–5.0)
Alkaline Phosphatase: 74 U/L (ref 38–126)
Anion gap: 7 (ref 5–15)
BUN: 13 mg/dL (ref 8–23)
CO2: 21 mmol/L — ABNORMAL LOW (ref 22–32)
Calcium: 8.6 mg/dL — ABNORMAL LOW (ref 8.9–10.3)
Chloride: 107 mmol/L (ref 98–111)
Creatinine, Ser: 0.56 mg/dL — ABNORMAL LOW (ref 0.61–1.24)
GFR, Estimated: >60 mL/min (ref >60)
Glucose, Bld: 151 mg/dL — ABNORMAL HIGH (ref 70–99)
Potassium: 4.1 mmol/L (ref 3.5–5.1)
Sodium: 135 mmol/L (ref 135–145)
Total Bilirubin: 0.7 mg/dL (ref 0.3–1.2)
Total Protein: 6.4 g/dL — ABNORMAL LOW (ref 6.5–8.1)

## 2023-07-31 LAB — TSH: TSH: 0.081 u[IU]/mL — ABNORMAL LOW (ref 0.350–4.500)

## 2023-07-31 LAB — T4, FREE: Free T4: 1.11 ng/dL (ref 0.61–1.12)

## 2023-07-31 NOTE — Patient Instructions (Addendum)
Please stop the methimizole which you were started on in the hospital

## 2023-07-31 NOTE — Progress Notes (Signed)
[facility] Cancer Initial Visit:  Patient Care Team: Gus Height, [place] as PCP - General (Family Medicine) [name], MD as PCP - Cardiology (Cardiology) Jenene Slicker Deberah Castle, MD as Consulting Physician (Optometry) Olegario Shearer, MD as Referring Physician (Dermatology) Loni Muse, MD as Consulting Physician (Internal Medicine)  CHIEF COMPLAINTS/PURPOSE OF CONSULTATION:  Oncology History  Malignant melanoma (HCC)  04/03/2023 Initial Diagnosis   Malignant melanoma (HCC)   04/03/2023 Cancer Staging   Staging form: Melanoma of the Skin, AJCC 8th Edition - Clinical stage from 04/03/2023: Stage IIC (cT4b, cN0, cM0) - Signed by Loni Muse, MD on 04/03/2023 Histopathologic type: Nodular melanoma Stage prefix: Initial diagnosis   05/13/2023 -  Chemotherapy   Patient is on Treatment Plan : MELANOMA Pembrolizumab (200) q21d       HISTORY OF PRESENTING ILLNESS: Jon Nelson 77 y.o. 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  Breslow depth 5.1 mm.  Clark's level 4.  Ulceration present.  Mitotic index 16/mm.  Pathologic stage T4b.  Deep margin involved  Apr 03 2023:  [facility] Medical Oncology Consult Patient here with sister in law [name] who is also his power of attorney.  He has had a mole on the left lateral leg for years but in the last year it degenerated into a sore which didn't heal which prompted him to see a dermatologist.  He has been followed by dermatology for removal of non-melanomatous skin cancers on his face and arms over the years.  He underwent a shave biopsy of the left leg lesion on March 19 2023.  He has not been noted to have in transit lesions.  April 01 2023 he underwent a wide local excision of the region but did not undergo  inguinal lymphadenectomy.     Social:  Widowed.  Lives with his dog.  Worked for [employer] where he worked outside.  No smoking.  EtOH none  [name] Mother died 74 dementia Father estranged No siblings.     Apr 12 2023:  MRI brain No evidence of metastatic disease. 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  Apr 14 2023:  Whole body PET/CT Postsurgical changes in the lateral left lower leg, as above.  No findings suspicious for metastatic disease.   Apr 29 2023:   Has undergone port placement since last visit.  Has not yet been scheduled for teaching or therapy.  Discussed risks and benefits of immunotherapy    May 06 2023:   Experiencing constipation.  Has urinary frequency but not interested in seeing a urologist.  To undergo chemotherapy teaching today.  Will check PSA  May 13 2023:  Cycle 1 Pembrolizumab  June 03 2023:  Cycle 2 Pembrolizumab  June 24 2023:    LE edema improved.  Does not check FSBG's.  Sister could not come to visit today because she struck a deer while driving.    TSH 0.31 Free T4 11.3 CMP notable for glucose 276  Cycle 3 Pembrolizumab  June 30 2023:  Admitted to [name] with mental status changes.   CT head without contrast   Generalized cerebral atrophy with chronic white matter small vessel ischemic changes.  No acute intracranial abnormality.  Carotid dopplers-   Right- moderate heterogeneous and calcified plaque,  with no  hemodynamically significant stenosis by duplex criteria in  the extracranial cerebrovascular circulation.   Left- Heterogeneous and partially calcified plaque at the left carotid bifurcation, with discordant results regarding degree of stenosis by  established duplex criteria. Peak velocity suggests 50%-69% stenosis, with the ICA/ CCA ratio suggesting a lesser degree of stenosis   CT neck angiography- No emergent vascular finding.  Widespread atherosclerosis with up to 50% left ICA bulb and  left V4 segment stenoses. Moderate heterogeneity of plaque at the left  ICA bulb  MRI head- No evidence of acute intracranial abnormality. Remote left posterior frontal cortical infarct and moderate chronic microvascular ischemic change.  Cardiac ECHO- LVEF 55-60%.  Aortic valve mildly sclerotic without stenosis.  Trivial MR.   Labs notable for Sodium 133 Glucose 150.  Urine specific gravity 1.023  TSH < 0.02 FT4 3.42 (ULN 3.42)  FT3 6.47 (ULN 5.27)  Patient was placed on methimidazole for hyperthyroidism  Patient was seen by neurology.    July 15 2023:  Scheduled follow up for melanoma  July 31 2023:   Scheduled follow up for melanoma.  States that he feels great but does stay sleepy for awhile.  Patient can't provide much information as to what happened on the day of admission to the hospital.  Here alone today.   Holding treatment pending results of lab testing   Review of Systems  Constitutional:  Negative for appetite change, chills, fatigue, fever and unexpected weight change.  HENT:   Positive for hearing loss. Negative for lump/mass, mouth sores, nosebleeds, sore throat and trouble swallowing.        Deaf left ear  Eyes:  Positive for eye problems. Negative for icterus.       Vision changes:  None  Respiratory:  Negative for chest tightness, cough, hemoptysis and wheezing.        DOE on walking up hill  Cardiovascular:  Positive for leg swelling. Negative for chest pain and palpitations.       PND:  none Orthopnea:  none  Gastrointestinal:  Negative for abdominal distention, abdominal pain, blood in stool, constipation, diarrhea, nausea and vomiting.  Endocrine: Negative for hot flashes.       Cold intolerance:  none Heat intolerance:  none  Genitourinary:  Positive for frequency. Negative for bladder incontinence, difficulty urinating, dysuria and hematuria.        Nocturia x 3 to 4  Musculoskeletal:  Negative for arthralgias, back pain, gait problem, myalgias, neck  pain and neck stiffness.  Skin:  Negative for rash.       Chronic pruritus of arms.  Healing from surgery to left leg  Neurological:  Negative for extremity weakness, gait problem, headaches, numbness and speech difficulty.       Occasional dizziness particularly when bending over.  No falls   Hematological:  Negative for adenopathy. Does not bruise/bleed easily.  Psychiatric/Behavioral:  Negative for sleep disturbance and suicidal ideas. The patient is not nervous/anxious.     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 [facility])    Old myocardial infarct    Other amnesia    TIA (transient ischemic attack)  SURGICAL HISTORY: Past Surgical History:  Procedure Laterality Date   CATARACT EXTRACTION Left 08/15/2020   CORONARY ARTERY BYPASS GRAFT N/A 09/24/2021   Procedure: CORONARY ARTERY BYPASS GRAFTING (CABG) TIMES THREE , ON PUMP, USING LEFT INTERNAL MAMMARY ARTERY AND ENDOSCOPICALLY HARVESTED RIGHT GREATER SAPHENOUS VEIN;  Surgeon: Corliss Skains, MD;  Location: [facility] OR;  Service: Open Heart Surgery;  Laterality: N/A;   ENDOVEIN HARVEST OF GREATER SAPHENOUS VEIN Right 09/24/2021   Procedure: ENDOVEIN HARVEST OF GREATER SAPHENOUS VEIN;  Surgeon: Corliss Skains, MD;  Location: [facility] OR;  Service: Open Heart Surgery;  Laterality: Right;   HEMORRHOID SURGERY     HERNIA REPAIR  09-10-2020   LEFT HEART CATH AND CORONARY ANGIOGRAPHY N/A 09/18/2021   Procedure: LEFT HEART CATH AND CORONARY ANGIOGRAPHY;  Surgeon: Lyn Records, MD;  Location: [facility] INVASIVE CV LAB;  Service: Cardiovascular;  Laterality: N/A;   LOOP RECORDER INSERTION N/A 02/03/2019   Procedure: LOOP RECORDER  INSERTION;  Surgeon: Regan Lemming, MD;  Location: [facility] INVASIVE CV LAB;  Service: Cardiovascular;  Laterality: N/A;   MOLE REMOVAL     PORTA CATH INSERTION     SKIN CANCER EXCISION     TEE WITHOUT CARDIOVERSION N/A 02/03/2019   Procedure: TRANSESOPHAGEAL ECHOCARDIOGRAM (TEE);  Surgeon: Jake Bathe, MD;  Location: [facility] ENDOSCOPY;  Service: Cardiovascular;  Laterality: N/A;  loop   TEE WITHOUT CARDIOVERSION N/A 09/24/2021   Procedure: TRANSESOPHAGEAL ECHOCARDIOGRAM (TEE);  Surgeon: Corliss Skains, MD;  Location: [facility] OR;  Service: Open Heart Surgery;  Laterality: N/A;    SOCIAL HISTORY: Social History   Socioeconomic History   Marital status: Widowed    Spouse name: Not on file   Number of children: 0   Years of education: Not on file   Highest education level: Not on file  Occupational History   Occupation: Retired Brewing technologist)  Tobacco Use   Smoking status: Never   Smokeless tobacco: Never  Vaping Use   Vaping status: Never Used  Substance and Sexual Activity   Alcohol use: Never   Drug use: Never   Sexual activity: Not Currently  Other Topics Concern   Not on file  Social History Narrative   Patient's wife passed away in 11-Sep-2015 due to cancer - she was unable to have children.  Patient has a sister, his niece and her family lives in his home temporarily    Social Determinants of Health   Financial Resource Strain: Low Risk  (01/16/2022)   Overall Financial Resource Strain (CARDIA)    Difficulty of Paying Living Expenses: Not hard at all  Food Insecurity: No Food Insecurity (04/03/2023)   Hunger Vital Sign    Worried About Running Out of Food in the Last Year: Never true    Ran Out of Food in the Last Year: Never true  Transportation Needs: No Transportation Needs (01/16/2022)   PRAPARE - Administrator, Civil Service (Medical): No    Lack of Transportation (Non-Medical): No  Physical Activity: Not on file  Stress: Not on file  Social Connections: Not on  file  Intimate Partner Violence: Not At Risk (04/03/2023)   Humiliation, Afraid, Rape, and Kick questionnaire    Fear of Current or Ex-Partner: No    Emotionally Abused: No    Physically Abused: No    Sexually Abused: No    FAMILY HISTORY Family History  Problem Relation Age of Onset   Stroke Mother    Alzheimer's disease Mother  ALLERGIES:  is allergic to aricept [donepezil].  MEDICATIONS:  Current Outpatient Medications  Medication Sig Dispense Refill   acetaminophen (TYLENOL) 500 MG tablet Take 500 mg by mouth every 6 (six) hours as needed for mild pain or moderate pain. (Patient not taking: Reported on 06/24/2023)     aspirin EC 81 MG EC tablet Take 1 tablet (81 mg total) by mouth daily. Swallow whole. (Patient not taking: Reported on 06/24/2023)     clopidogrel (PLAVIX) 75 MG tablet Take 1 tablet (75 mg total) by mouth daily. 30 tablet 1   finasteride (PROSCAR) 5 MG tablet Take 1 tablet (5 mg total) by mouth daily. 30 tablet 5   meloxicam (MOBIC) 15 MG tablet Take 15 mg by mouth daily.     metFORMIN (GLUCOPHAGE) 1000 MG tablet Take 1,000 mg by mouth 2 (two) times daily.     methimazole (TAPAZOLE) 5 MG tablet Take 5 mg by mouth 2 (two) times daily.     metoprolol tartrate (LOPRESSOR) 25 MG tablet Take 0.5 tablets (12.5 mg total) by mouth 2 (two) times daily. 30 tablet 1   ondansetron (ZOFRAN) 8 MG tablet Take 1 tablet (8 mg total) by mouth every 8 (eight) hours as needed for nausea or vomiting. (Patient not taking: Reported on 06/24/2023) 30 tablet 1   prochlorperazine (COMPAZINE) 10 MG tablet Take 1 tablet (10 mg total) by mouth every 6 (six) hours as needed for nausea or vomiting. (Patient not taking: Reported on 06/24/2023) 30 tablet 1   rosuvastatin (CRESTOR) 20 MG tablet Take 20 mg by mouth daily.     traMADol (ULTRAM) 50 MG tablet Take 50 mg by mouth every 6 (six) hours as needed. (Patient not taking: Reported on 06/24/2023)     No current facility-administered medications for  this visit.    PHYSICAL EXAMINATION:  ECOG PERFORMANCE STATUS: 1 - Symptomatic but completely ambulatory   There were no vitals filed for this visit.    There were no vitals filed for this visit.     Physical Exam Vitals and nursing note reviewed.  Constitutional:      Appearance: Normal appearance. He is not diaphoretic.     Comments: Here alone  HENT:     Head: Normocephalic and atraumatic.     Right Ear: External ear normal.     Left Ear: External ear normal.     Nose: Nose normal.  Eyes:     Conjunctiva/sclera: Conjunctivae normal.     Pupils: Pupils are equal, round, and reactive to light.  Cardiovascular:     Rate and Rhythm: Normal rate and regular rhythm.     Heart sounds:     No friction rub. No gallop.  Pulmonary:     Effort: Pulmonary effort is normal. No respiratory distress.     Breath sounds: Normal breath sounds. No stridor.  Abdominal:     General: Abdomen is flat. There is no distension.     Palpations: Abdomen is soft.     Tenderness: There is no abdominal tenderness. There is no guarding.  Musculoskeletal:        General: No swelling. Normal range of motion.     Cervical back: Normal range of motion and neck supple. No rigidity or tenderness.     Right lower leg: No edema.     Left lower leg: No edema.  Lymphadenopathy:     Head:     Right side of head: No submental, submandibular, tonsillar, preauricular, posterior auricular or occipital adenopathy.  Left side of head: No submental, submandibular, tonsillar, preauricular, posterior auricular or occipital adenopathy.     Cervical: No cervical adenopathy.     Right cervical: No superficial, deep or posterior cervical adenopathy.    Left cervical: No superficial, deep or posterior cervical adenopathy.     Upper Body:     Right upper body: No supraclavicular or axillary adenopathy.     Left upper body: No supraclavicular or axillary adenopathy.     Lower Body: No right inguinal adenopathy. No  left inguinal adenopathy.  Skin:    Coloration: Skin is not jaundiced.     Findings: No bruising or erythema.     Comments: Fitzpatrick 2 skin tone.  Significant solar damage to face and arms.   Left leg bandaged.    Neurological:     General: No focal deficit present.     Mental Status: He is alert and oriented to person, place, and time.     Comments: Hard of hearing  Psychiatric:        Mood and Affect: Mood normal.        Behavior: Behavior normal.        Thought Content: Thought content normal.        Judgment: Judgment normal.     LABORATORY DATA: I have personally reviewed the data as listed:  No visits with results within 1 Month(s) from this visit.  Latest known visit with results is:  Appointment on 06/24/2023  Component Date Value Ref Range Status   Cortisol, Plasma 06/24/2023 7.4  ug/dL Final   Comment: (NOTE) AM    6.7 - 22.6 ug/dL PM   <08.6       ug/dL Performed at [facility], [address] [place], [place] [address]    Free T4 06/24/2023 2.18 (H)  0.61 - 1.12 ng/dL Final   Comment: (NOTE) Biotin ingestion may interfere with free T4 tests. If the results are inconsistent with the TSH level, previous test results, or the clinical presentation, then consider biotin interference. If needed, order repeat testing after stopping biotin. Performed at [facility], [address] [place], [place] [address]    TSH 06/24/2023 0.025 (L)  0.350 - 4.500 uIU/mL Final   Comment: Performed by a 3rd Generation assay with a functional sensitivity of <=0.01 uIU/mL. Performed at [facility], [address] [place], [place] [address]    Sodium 06/24/2023 137  135 - 145 mmol/L Final   Potassium 06/24/2023 4.2  3.5 - 5.1 mmol/L Final   Chloride 06/24/2023 105  98 - 111 mmol/L Final   CO2 06/24/2023 23  22 - 32 mmol/L Final   Glucose, Bld 06/24/2023 147 (H)  70 - 99 mg/dL Final   Glucose reference range applies only to samples taken after  fasting for at least 8 hours.   BUN 06/24/2023 15  8 - 23 mg/dL Final   Creatinine, Ser 06/24/2023 0.75  0.61 - 1.24 mg/dL Final   Calcium 24/40/1027 9.0  8.9 - 10.3 mg/dL Final   Total Protein 25/36/6440 6.6  6.5 - 8.1 g/dL Final   Albumin 34/74/2595 3.6  3.5 - 5.0 g/dL Final   AST 63/87/5643 19  15 - 41 U/L Final   ALT 06/24/2023 18  0 - 44 U/L Final   Alkaline Phosphatase 06/24/2023 77  38 - 126 U/L Final   Total Bilirubin 06/24/2023 0.6  0.3 - 1.2 mg/dL Final   GFR, Estimated 06/24/2023 >60  >60  mL/min Final   Comment: (NOTE) Calculated using the CKD-EPI Creatinine Equation (2021)    Anion gap 06/24/2023 9  5 - 15 Final   Performed at [facility], [address] [place], [place] [address]   WBC 06/24/2023 10.0  4.0 - 10.5 K/uL Final   RBC 06/24/2023 4.83  4.22 - 5.81 MIL/uL Final   Hemoglobin 06/24/2023 14.3  13.0 - 17.0 g/dL Final   HCT 19/14/7829 44.1  39.0 - 52.0 % Final   MCV 06/24/2023 91.3  80.0 - 100.0 fL Final   MCH 06/24/2023 29.6  26.0 - 34.0 pg Final   MCHC 06/24/2023 32.4  30.0 - 36.0 g/dL Final   RDW 56/21/3086 13.4  11.5 - 15.5 % Final   Platelets 06/24/2023 294  150 - 400 K/uL Final   nRBC 06/24/2023 0.0  0.0 - 0.2 % Final   Neutrophils Relative % 06/24/2023 51  % Final   Neutro Abs 06/24/2023 5.1  1.7 - 7.7 K/uL Final   Lymphocytes Relative 06/24/2023 33  % Final   Lymphs Abs 06/24/2023 3.3  0.7 - 4.0 K/uL Final   Monocytes Relative 06/24/2023 12  % Final   Monocytes Absolute 06/24/2023 1.2 (H)  0.1 - 1.0 K/uL Final   Eosinophils Relative 06/24/2023 3  % Final   Eosinophils Absolute 06/24/2023 0.3  0.0 - 0.5 K/uL Final   Basophils Relative 06/24/2023 0  % Final   Basophils Absolute 06/24/2023 0.0  0.0 - 0.1 K/uL Final   Immature Granulocytes 06/24/2023 1  % Final   Abs Immature Granulocytes 06/24/2023 0.05  0.00 - 0.07 K/uL Final   Performed at [facility], [address] [place], [place] [address]    RADIOGRAPHIC  STUDIES: I have personally reviewed the radiological images as listed and agree with the findings in the report  No results found.  ASSESSMENT/PLAN 77 y.o. male is here because of  malignant melanoma.  Medical history notable for diabetes mellitus, coronary artery disease, cerebrovascular disease, MI, cataracts  Malignant melanoma, distal left leg, Stage IIC (T4b N0 M0)- Risk factors for this patient are 1) chronic exposure to UV 2) Sunburns 3) Fitzpatrick Grade 2 skin tone March 19, 2023: Shave biopsy left lateral leg demonstrated malignant melanoma, nodular type. Breslow depth 5.1 mm. Clark's level 4. Ulceration present. Mitotic index 16/mm. Pathologic stage T4b. Deep margin involved  April 01 2023- Wide local excision.  Sentinel LN bx not performed due to patient frailty  Therapeutics   Apr 03 2023- Will obtain CBC with diff, CMP, LDH, MRI brain and whole body PET/CT for staging  Patient meets criteria per NCCN Guidelines for adjuvant immunotherapy and will plan on adjuvant Pembrolizumab  May 06 2023- Chemotherapy teaching  May 13 2023:  Cycle 1 Pembrolizumab  June 03 2023:  Cycle 2 Pembrolizumab  June 24 2023:  TSH 0.31 Free T4 11.3-- Will follow as this may be due to thyroiditis from Pembrolizumab.   Cycle 3 Pembrolizumab  July 31 2923- Pembrolizumab currently on hold due to recent bout of mental status changes.  Rechecking thyroid studies and will likely stop methimidazole as he does not appear to have thyrotoxicosis.     Immunotherapy Risks:   May 06 2023- Reiterated potential side effects of immunotherapy with the patient.  These include but are not limited to:  Fatigue, Hair loss, low blood counts (anemia, thrombocytopenia) that may necessitate transfusion, bleeding, infection, nausea/ vomiting/Appetite changes/ Constipation/ Diarrhea, mucositis, Neuropathy/ neurologic problems, skin and nail changes  such as dry skin and color change, Urine and bladder changes and kidney problems,  weight changes, mood changes, decreased libido/fertility problems, damage to heart and lungs.  Some of these side effects can be life threatening, may be permanent and can result in hospitalization and/or death.  In shared decision making patient has agreed to proceed with immunotherapy.    Poor IV access  Apr 29 2023- Has undergone portacath placement  Urinary frequency  May 06 2023- Likely secondary to BPH.  PSA 7.11 and began Proscar.    June 24 2023- Urinary frequency improved.  May be due to BPH with contribution of hyperglycemia  Elevated PSA  May 06 2023- PSA 7.11  June 24 2023- Referring to urology for evaluation     Cancer Staging  Malignant melanoma [facility]) Staging form: Melanoma of the Skin, AJCC 8th Edition - Clinical stage from 04/03/2023: Stage IIC (cT4b, cN0, cM0) - Signed by Loni Muse, MD on 04/03/2023 Histopathologic type: Nodular melanoma Stage prefix: Initial diagnosis    No problem-specific Assessment & Plan notes found for this encounter.    No orders of the defined types were placed in this encounter.   30 minutes was spent in patient care.  This included time spent preparing to see the patient (e.g., review of tests), obtaining and/or reviewing separately obtained history, counseling and educating the patient/family/caregiver, ordering medications, tests, or procedures; documenting clinical information in the electronic or other health record, independently interpreting results and communicating results to the patient/family/caregiver as well as coordination of care.       All questions were answered. The patient knows to call the clinic with any problems, questions or concerns.  This note was electronically signed.    Loni Muse, MD  07/31/2023 10:15 AM

## 2023-08-01 ENCOUNTER — Telehealth: Payer: Self-pay | Admitting: Oncology

## 2023-08-01 NOTE — Telephone Encounter (Signed)
Patient has been scheduled. Aware of appt date and time.      Scheduling Message Entered by Domenic Schwab on 08/01/2023 at  9:16 AM Priority: High <No visit type provided>  Department: [facility]  Provider:  Appointment Notes:  Please move his keytruda from 9/3 to 9/16.  Dr. Angelene Giovanni wants to hold treatment pending some lab results.

## 2023-08-05 ENCOUNTER — Inpatient Hospital Stay: Payer: Medicare HMO

## 2023-08-05 DIAGNOSIS — Z85820 Personal history of malignant melanoma of skin: Secondary | ICD-10-CM | POA: Diagnosis not present

## 2023-08-05 DIAGNOSIS — C4372 Malignant melanoma of left lower limb, including hip: Secondary | ICD-10-CM | POA: Diagnosis not present

## 2023-08-05 DIAGNOSIS — L570 Actinic keratosis: Secondary | ICD-10-CM | POA: Diagnosis not present

## 2023-08-05 DIAGNOSIS — L821 Other seborrheic keratosis: Secondary | ICD-10-CM | POA: Diagnosis not present

## 2023-08-07 LAB — PARANEOPLASTIC AB
AGNA-1: NEGATIVE
Amphiphysin Antibody: NEGATIVE
Anti-Hu Ab: NEGATIVE
Anti-Ri Ab: NEGATIVE
Anti-Yo Ab: NEGATIVE
Antineruonal nuclear Ab Type 3: NEGATIVE
CASPR2 Antibody,Cell-based IFA: NEGATIVE
CRMP-5 IgG: NEGATIVE
Interpretation: NEGATIVE
LGI1 Antibody, Cell-based IFA: NEGATIVE
Purkinje Cell Cyto Ab Type 2: NEGATIVE
Purkinje Cell Cyto Ab Type Tr: NEGATIVE
VGCC Antibody: <1 pmol/L (ref 0.0–30.0)

## 2023-08-08 LAB — THYROGLOBULIN ANTIBODY: Thyroglobulin Antibody: <1 [IU]/mL (ref 0.0–0.9)

## 2023-08-14 ENCOUNTER — Inpatient Hospital Stay: Payer: Medicare HMO | Admitting: Oncology

## 2023-08-14 ENCOUNTER — Inpatient Hospital Stay: Payer: Medicare HMO

## 2023-08-14 ENCOUNTER — Encounter: Payer: Self-pay | Admitting: Oncology

## 2023-08-14 ENCOUNTER — Telehealth: Payer: Self-pay | Admitting: Oncology

## 2023-08-14 DIAGNOSIS — E064 Drug-induced thyroiditis: Secondary | ICD-10-CM | POA: Insufficient documentation

## 2023-08-14 NOTE — Progress Notes (Deleted)
[facility] Cancer Initial Visit:  Patient Care Team: Gus Height, [place] as PCP - General (Family Medicine) [name], MD as PCP - Cardiology (Cardiology) Jenene Slicker Deberah Castle, MD as Consulting Physician (Optometry) Olegario Shearer, MD as Referring Physician (Dermatology) Loni Muse, MD as Consulting Physician (Internal Medicine)  CHIEF COMPLAINTS/PURPOSE OF CONSULTATION:  Oncology History  Malignant melanoma (HCC)  04/03/2023 Initial Diagnosis   Malignant melanoma (HCC)   04/03/2023 Cancer Staging   Staging form: Melanoma of the Skin, AJCC 8th Edition - Clinical stage from 04/03/2023: Stage IIC (cT4b, cN0, cM0) - Signed by Loni Muse, MD on 04/03/2023 Histopathologic type: Nodular melanoma Stage prefix: Initial diagnosis   05/13/2023 -  Chemotherapy   Patient is on Treatment Plan : MELANOMA Pembrolizumab (200) q21d       HISTORY OF PRESENTING ILLNESS: Jon Nelson 77 y.o. 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  Breslow depth 5.1 mm.  Clark's level 4.  Ulceration present.  Mitotic index 16/mm.  Pathologic stage T4b.  Deep margin involved  Apr 03 2023:  [facility] On[facility] Consult Patient here with sister in law [name] who is also his power of attorney.  He has had a mole on the left lateral leg for years but in the last year it degenerated into a sore which didn't heal which prompted him to see a dermatologist.  He has been followed by dermatology for removal of non-melanomatous skin cancers on his face and arms over the years.  He underwent a shave biopsy of the left leg lesion on March 19 2023.  He has not been noted to have in transit lesions.  April 01 2023 he underwent a wide local excision of the region but did not undergo  inguinal lymphadenectomy.     Social:  Widowed.  Lives with his dog.  Worked for [employer] where he worked outside.  No smoking.  EtOH none  [facility] Mother died 33 dementia Father estranged No siblings.     Apr 12 2023:  MRI brain No evidence of metastatic disease. 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  Apr 14 2023:  Whole body PET/CT Postsurgical changes in the lateral left lower leg, as above.  No findings suspicious for metastatic disease.   Apr 29 2023:   Has undergone port placement since last visit.  Has not yet been scheduled for teaching or therapy.  Discussed risks and benefits of immunotherapy    May 06 2023:   Experiencing constipation.  Has urinary frequency but not interested in seeing a urologist.  To undergo chemotherapy teaching today.  Will check PSA  May 13 2023:  Cycle 1 Pembrolizumab  June 03 2023:  Cycle 2 Pembrolizumab  June 24 2023:    LE edema improved.  Does not check FSBG's.  Sister could not come to visit today because she struck a deer while driving.    TSH 0.31 Free T4 11.3 CMP notable for glucose 276  Cycle 3 Pembrolizumab  June 30 2023:  Admitted to [facility] with mental status changes.   CT head without contrast   Generalized cerebral atrophy with chronic white matter small vessel ischemic changes.  No acute intracranial abnormality.  Carotid dopplers-   Right- moderate heterogeneous and calcified plaque,  with no  hemodynamically significant stenosis by duplex criteria in  the extracranial cerebrovascular circulation.   Left- Heterogeneous and partially calcified plaque at the left carotid bifurcation, with discordant results regarding degree of stenosis by  established duplex criteria. Peak velocity suggests 50%-69% stenosis, with the ICA/ CCA ratio suggesting a lesser degree of stenosis   CT neck angiography- No emergent vascular finding.  Widespread atherosclerosis with up to 50% left ICA bulb and  left V4 segment stenoses. Moderate heterogeneity of plaque at the left  ICA bulb  MRI head- No evidence of acute intracranial abnormality. Remote left posterior frontal cortical infarct and moderate chronic microvascular ischemic change.  Cardiac ECHO- LVEF 55-60%.  Aortic valve mildly sclerotic without stenosis.  Trivial MR.   Labs notable for Sodium 133 Glucose 150.  Urine specific gravity 1.023  TSH < 0.02 FT4 3.42 (ULN 3.42)  FT3 6.47 (ULN 5.27)  Patient was placed on methimidazole for hyperthyroidism  Patient was seen by neurology.   July 15 2023:    July 31 2023:   Scheduled follow up for melanoma.  States that he feels great but does stay sleepy for awhile.  Patient can't provide much information as to what happened on the day of admission to the hospital.  Here alone today.   Holding treatment pending results of lab testing   Review of Systems  Constitutional:  Negative for appetite change, chills, fatigue, fever and unexpected weight change.  HENT:   Positive for hearing loss. Negative for lump/mass, mouth sores, nosebleeds, sore throat and trouble swallowing.        Deaf left ear  Eyes:  Positive for eye problems. Negative for icterus.       Vision changes:  None  Respiratory:  Negative for chest tightness, cough, hemoptysis and wheezing.        DOE on walking up hill  Cardiovascular:  Positive for leg swelling. Negative for chest pain and palpitations.       PND:  none Orthopnea:  none  Gastrointestinal:  Negative for abdominal distention, abdominal pain, blood in stool, constipation, diarrhea, nausea and vomiting.  Endocrine: Negative for hot flashes.       Cold intolerance:  none Heat intolerance:  none  Genitourinary:  Positive for frequency. Negative for bladder incontinence, difficulty urinating, dysuria and hematuria.        Nocturia x 3 to 4  Musculoskeletal:  Negative for arthralgias, back pain, gait problem, myalgias, neck pain and neck stiffness.  Skin:   Negative for rash.       Chronic pruritus of arms.  Healing from surgery to left leg  Neurological:  Negative for extremity weakness, gait problem, headaches, numbness and speech difficulty.       Occasional dizziness particularly when bending over.  No falls   Hematological:  Negative for adenopathy. Does not bruise/bleed easily.  Psychiatric/Behavioral:  Negative for sleep disturbance and suicidal ideas. The patient is not nervous/anxious.     MEDICAL HISTORY: Past Medical History:  Diagnosis Date  . Carotid artery stenosis   . Coronary artery stenosis   . Diabetes (HCC)   . Diabetes mellitus with cardiac complication (HCC)   . GI bleed   . HLD (hyperlipidemia)   . HTN (hypertension)   . Mixed hyperlipidemia   . 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 Old myocardial infarct   . Other amnesia   . TIA (transient ischemic attack)     SURGICAL HISTORY: Past Surgical History:  Procedure Laterality Date  . CATARACT EXTRACTION Left 08/15/2020  . CORONARY ARTERY BYPASS GRAFT N/A 09/24/2021   Procedure: CORONARY ARTERY BYPASS GRAFTING (CABG) TIMES THREE , ON PUMP, USING LEFT INTERNAL MAMMARY ARTERY AND ENDOSCOPICALLY HARVESTED RIGHT GREATER SAPHENOUS VEIN;  Surgeon: Corliss Skains, MD;  Location: [facility] OR;  Service: Open Heart Surgery;  Laterality: N/A;  . ENDOVEIN HARVEST OF GREATER SAPHENOUS VEIN Right 09/24/2021   Procedure: ENDOVEIN HARVEST OF GREATER SAPHENOUS VEIN;  Surgeon: Corliss Skains, MD;  Location: [facility] OR;  Service: Open Heart Surgery;  Laterality: Right;  . HEMORRHOID SURGERY    . HERNIA REPAIR  2020/09/06  . LEFT HEART CATH AND CORONARY ANGIOGRAPHY N/A 09/18/2021   Procedure: LEFT HEART CATH AND CORONARY ANGIOGRAPHY;  Surgeon: Lyn Records, MD;  Location: [facility] INVASIVE CV LAB;  Service: Cardiovascular;  Laterality: N/A;  . LOOP RECORDER INSERTION N/A 02/03/2019   Procedure: LOOP RECORDER INSERTION;  Surgeon:  Regan Lemming, MD;  Location: [facility] INVASIVE CV LAB;  Service: Cardiovascular;  Laterality: N/A;  . MOLE REMOVAL    . PORTA CATH INSERTION    . SKIN CANCER EXCISION    . TEE WITHOUT CARDIOVERSION N/A 02/03/2019   Procedure: TRANSESOPHAGEAL ECHOCARDIOGRAM (TEE);  Surgeon: Jake Bathe, MD;  Location: [facility] ENDOSCOPY;  Service: Cardiovascular;  Laterality: N/A;  loop  . TEE WITHOUT CARDIOVERSION N/A 09/24/2021   Procedure: TRANSESOPHAGEAL ECHOCARDIOGRAM (TEE);  Surgeon: Corliss Skains, MD;  Location: [facility] OR;  Service: Open Heart Surgery;  Laterality: N/A;    SOCIAL HISTORY: Social History   Socioeconomic History  . Marital status: Widowed    Spouse name: Not on file  . Number of children: 0  . Years of education: Not on file  . Highest education level: Not on file  Occupational History  . Occupation: Retired Brewing technologist)  Tobacco Use  . Smoking status: Never  . Smokeless tobacco: Never  Vaping Use  . Vaping status: Never Used  Substance and Sexual Activity  . Alcohol use: Never  . Drug use: Never  . Sexual activity: Not Currently  Other Topics Concern  . Not on file  Social History Narrative   Patient's wife passed away in 09/07/2015 due to cancer - she was unable to have children.  Patient has a sister, his niece and her family lives in his home temporarily    Social Determinants of Health   Financial Resource Strain: Low Risk  (01/16/2022)   Overall Financial Resource Strain (CARDIA)   . Difficulty of Paying Living Expenses: Not hard at all  Food Insecurity: No Food Insecurity (04/03/2023)   Hunger Vital Sign   . Worried About Programme researcher, broadcasting/film/video in the Last Year: Never true   . Ran Out of Food in the Last Year: Never true  Transportation Needs: No Transportation Needs (01/16/2022)   PRAPARE - Transportation   . Lack of Transportation (Medical): No   . Lack of Transportation (Non-Medical): No  Physical Activity: Not on file  Stress: Not on file  Social Connections: Not  on file  Intimate Partner Violence: Not At Risk (04/03/2023)   Humiliation, Afraid, Rape, and Kick questionnaire   . Fear of Current or Ex-Partner: No   . Emotionally Abused: No   . Physically Abused: No   . Sexually Abused: No    FAMILY HISTORY Family History  Problem Relation Age of Onset  . Stroke Mother   . Alzheimer's disease Mother     ALLERGIES:  is allergic to aricept [  donepezil].  MEDICATIONS:  Current Outpatient Medications  Medication Sig Dispense Refill  . acetaminophen (TYLENOL) 500 MG tablet Take 500 mg by mouth every 6 (six) hours as needed for mild pain or moderate pain. (Patient not taking: Reported on 06/24/2023)    . aspirin EC 81 MG EC tablet Take 1 tablet (81 mg total) by mouth daily. Swallow whole. (Patient not taking: Reported on 06/24/2023)    . clopidogrel (PLAVIX) 75 MG tablet Take 1 tablet (75 mg total) by mouth daily. 30 tablet 1  . finasteride (PROSCAR) 5 MG tablet Take 1 tablet (5 mg total) by mouth daily. 30 tablet 5  . meloxicam (MOBIC) 15 MG tablet Take 15 mg by mouth daily.    . metFORMIN (GLUCOPHAGE) 1000 MG tablet Take 1,000 mg by mouth 2 (two) times daily.    . metoprolol tartrate (LOPRESSOR) 25 MG tablet Take 0.5 tablets (12.5 mg total) by mouth 2 (two) times daily. 30 tablet 1  . ondansetron (ZOFRAN) 8 MG tablet Take 1 tablet (8 mg total) by mouth every 8 (eight) hours as needed for nausea or vomiting. (Patient not taking: Reported on 06/24/2023) 30 tablet 1  . prochlorperazine (COMPAZINE) 10 MG tablet Take 1 tablet (10 mg total) by mouth every 6 (six) hours as needed for nausea or vomiting. (Patient not taking: Reported on 06/24/2023) 30 tablet 1  . rosuvastatin (CRESTOR) 20 MG tablet Take 20 mg by mouth daily.    . traMADol (ULTRAM) 50 MG tablet Take 50 mg by mouth every 6 (six) hours as needed. (Patient not taking: Reported on 06/24/2023)     No current facility-administered medications for this visit.    PHYSICAL EXAMINATION:  ECOG PERFORMANCE  STATUS: 1 - Symptomatic but completely ambulatory   There were no vitals filed for this visit.    There were no vitals filed for this visit.     Physical Exam Vitals and nursing note reviewed.  Constitutional:      Appearance: Normal appearance. He is not diaphoretic.     Comments: Here alone  HENT:     Head: Normocephalic and atraumatic.     Right Ear: External ear normal.     Left Ear: External ear normal.     Nose: Nose normal.  Eyes:     Conjunctiva/sclera: Conjunctivae normal.     Pupils: Pupils are equal, round, and reactive to light.  Cardiovascular:     Rate and Rhythm: Normal rate and regular rhythm.     Heart sounds:     No friction rub. No gallop.  Pulmonary:     Effort: Pulmonary effort is normal. No respiratory distress.     Breath sounds: Normal breath sounds. No stridor.  Abdominal:     General: Abdomen is flat. There is no distension.     Palpations: Abdomen is soft.     Tenderness: There is no abdominal tenderness. There is no guarding.  Musculoskeletal:        General: No swelling. Normal range of motion.     Cervical back: Normal range of motion and neck supple. No rigidity or tenderness.     Right lower leg: No edema.     Left lower leg: No edema.  Lymphadenopathy:     Head:     Right side of head: No submental, submandibular, tonsillar, preauricular, posterior auricular or occipital adenopathy.     Left side of head: No submental, submandibular, tonsillar, preauricular, posterior auricular or occipital adenopathy.     Cervical: No cervical  adenopathy.     Right cervical: No superficial, deep or posterior cervical adenopathy.    Left cervical: No superficial, deep or posterior cervical adenopathy.     Upper Body:     Right upper body: No supraclavicular or axillary adenopathy.     Left upper body: No supraclavicular or axillary adenopathy.     Lower Body: No right inguinal adenopathy. No left inguinal adenopathy.  Skin:    Coloration: Skin is  not jaundiced.     Findings: No bruising or erythema.     Comments: Fitzpatrick 2 skin tone.  Significant solar damage to face and arms.   Left leg bandaged.    Neurological:     General: No focal deficit present.     Mental Status: He is alert and oriented to person, place, and time.     Comments: Hard of hearing  Psychiatric:        Mood and Affect: Mood normal.        Behavior: Behavior normal.        Thought Content: Thought content normal.        Judgment: Judgment normal.    LABORATORY DATA: I have personally reviewed the data as listed:  Appointment on 07/31/2023  Component Date Value Ref Range Status  . Thyroglobulin Antibody 07/31/2023 <1.0  0.0 - 0.9 IU/mL Final   Comment: (NOTE) Thyroglobulin Antibody measured by Beckman Coulter Methodology It should be noted that the presence of thyroglobulin antibodies may not be pathogenic nor diagnostic, especially at very low levels. The assay manufacturer has found that four percent of individuals without evidence of thyroid disease or autoimmunity will have positive TgAb levels up to 4 IU/mL. Performed At: [facility] [address] [place], [place] [id number] Jolene Schimke MD BJ:[id number]   . Interpretation 07/31/2023 Negative  Negative Final   Comment: (NOTE) A negative paraneoplastic autoantibody result does not rule out all clinically relevant antibodies. If indicated, a phenotype- specific profile (For example, Encephalopathy, dementia, myelopathy, or axonal neuropathy) should be considered.   . Anti-Hu Ab 07/31/2023 Negative  Negative Final   Comment: (NOTE) This test was developed and its performance characteristics determined by [facility]. It has not been cleared or approved by the Food and Drug Administration.   Woodroe Chen Ab 07/31/2023 Negative  Negative Final   Comment: (NOTE) This test was developed and its performance characteristics determined by [facility]. It has not been cleared or approved by  the Food and Drug Administration.   Marland Kitchen Antineruonal nuclear Ab Type 3 07/31/2023 Negative  Negative Final   Comment: (NOTE) This test was developed and its performance characteristics determined by [facility]. It has not been cleared or approved by the Food and Drug Administration.   . Anti-Yo Ab 07/31/2023 Negative  Negative Final   Comment: (NOTE) This test was developed and its performance characteristics determined by [facility]. It has not been cleared or approved by the Food and Drug Administration.   . Purkinje Cell Cyto Ab Type 2 07/31/2023 Negative  Negative Final   Comment: (NOTE) This test was developed and its performance characteristics determined by [facility]. It has not been cleared or approved by the Food and Drug Administration.   . Purkinje Cell Cyto Ab Type Tr 07/31/2023 Negative  Negative Final   Comment: (NOTE) This test was developed and its performance characteristics determined by [facility]. It has not been cleared or approved by the Food and Drug Administration.   . Amphiphysin Antibody 07/31/2023 Negative  Negative Final   Comment: (  NOTE) This test was developed and its performance characteristics determined by [facility]. It has not been cleared or approved by the Food and Drug Administration.   Marland Kitchen CRMP-5 IgG 07/31/2023 Negative  Negative Final   Comment: (NOTE) This test was developed and its performance characteristics determined by [facility]. It has not been cleared or approved by the Food and Drug Administration.   Marland Kitchen AGNA-1 07/31/2023 Negative  Negative Final   Comment: (NOTE) This test was developed and its performance characteristics determined by [facility]. It has not been cleared or approved by the Food and Drug Administration.   Marland Kitchen VGCC Antibody 07/31/2023 <1.0  0.0 - 30.0 pmol/L Final  . CASPR2 Antibody,Cell-based IFA 07/31/2023 Negative  Negative Final   Comment: (NOTE) This test was developed and its performance characteristics determined by [facility].  It has not been cleared or approved by the Food and Drug Administration.   Marland Kitchen LGI1 Antibody, Cell-based IFA 07/31/2023 Negative  Negative Final   Comment: (NOTE) This test was developed and its performance characteristics determined by [facility]. It has not been cleared or approved by the Food and Drug Administration. Performed At: [facility] [address] [place], [place] [id number] Jolene Schimke MD VO:[id number]   . Free T4 07/31/2023 1.11  0.61 - 1.12 ng/dL Final   Comment: (NOTE) Biotin ingestion may interfere with free T4 tests. If the results are inconsistent with the TSH level, previous test results, or the clinical presentation, then consider biotin interference. If needed, order repeat testing after stopping biotin. Performed at [facility], [address], [place], [place] [address]   . TSH 07/31/2023 0.081 (L)  0.350 - 4.500 uIU/mL Final   Comment: Performed by a 3rd Generation assay with a functional sensitivity of <=0.01 uIU/mL. Performed at [facility], [address] [place], [place] [address]   . Sodium 07/31/2023 135  135 - 145 mmol/L Final  . Potassium 07/31/2023 4.1  3.5 - 5.1 mmol/L Final  . Chloride 07/31/2023 107  98 - 111 mmol/L Final  . CO2 07/31/2023 21 (L)  22 - 32 mmol/L Final  . Glucose, Bld 07/31/2023 151 (H)  70 - 99 mg/dL Final   Glucose reference range applies only to samples taken after fasting for at least 8 hours.  . BUN 07/31/2023 13  8 - 23 mg/dL Final  . Creatinine, Ser 07/31/2023 0.56 (L)  0.61 - 1.24 mg/dL Final  . Calcium 64/33/2951 8.6 (L)  8.9 - 10.3 mg/dL Final  . Total Protein 07/31/2023 6.4 (L)  6.5 - 8.1 g/dL Final  . Albumin 88/41/6606 3.3 (L)  3.5 - 5.0 g/dL Final  . AST 30/16/0109 15  15 - 41 U/L Final  . ALT 07/31/2023 13  0 - 44 U/L Final  . Alkaline Phosphatase 07/31/2023 74  38 - 126 U/L Final  . Total Bilirubin 07/31/2023 0.7  0.3 - 1.2 mg/dL Final  . GFR, Estimated 07/31/2023 >60  >60  mL/min Final   Comment: (NOTE) Calculated using the CKD-EPI Creatinine Equation (2021)   . Anion gap 07/31/2023 7  5 - 15 Final   Performed at [facility], [address], [place], [place] [address]  . WBC 07/31/2023 10.1  4.0 - 10.5 K/uL Final  . RBC 07/31/2023 4.92  4.22 - 5.81 MIL/uL Final  . Hemoglobin 07/31/2023 14.3  13.0 - 17.0 g/dL Final  . HCT 73/22/0254 43.1  39.0 - 52.0 % Final  . MCV 07/31/2023 87.6  80.0 - 100.0  fL Final  . MCH 07/31/2023 29.1  26.0 - 34.0 pg Final  . MCHC 07/31/2023 33.2  30.0 - 36.0 g/dL Final  . RDW 62/95/2841 13.5  11.5 - 15.5 % Final  . Platelets 07/31/2023 332  150 - 400 K/uL Final  . nRBC 07/31/2023 0.0  0.0 - 0.2 % Final  . Neutrophils Relative % 07/31/2023 50  % Final  . Neutro Abs 07/31/2023 5.1  1.7 - 7.7 K/uL Final  . Lymphocytes Relative 07/31/2023 34  % Final  . Lymphs Abs 07/31/2023 3.5  0.7 - 4.0 K/uL Final  . Monocytes Relative 07/31/2023 11  % Final  . Monocytes Absolute 07/31/2023 1.1 (H)  0.1 - 1.0 K/uL Final  . Eosinophils Relative 07/31/2023 3  % Final  . Eosinophils Absolute 07/31/2023 0.3  0.0 - 0.5 K/uL Final  . Basophils Relative 07/31/2023 1  % Final  . Basophils Absolute 07/31/2023 0.1  0.0 - 0.1 K/uL Final  . Immature Granulocytes 07/31/2023 1  % Final  . Abs Immature Granulocytes 07/31/2023 0.05  0.00 - 0.07 K/uL Final   Performed at [facility], [address], [place], [place] [address]    RADIOGRAPHIC STUDIES: I have personally reviewed the radiological images as listed and agree with the findings in the report  No results found.  ASSESSMENT/PLAN 77 y.o. male is here because of  malignant melanoma.  Medical history notable for diabetes mellitus, coronary artery disease, cerebrovascular disease, MI, cataracts  Malignant melanoma, distal left leg, Stage IIC (T4b N0 M0)- Risk factors for this patient are 1) chronic exposure to UV 2) Sunburns 3) Fitzpatrick Grade 2 skin  tone March 19, 2023: Shave biopsy left lateral leg demonstrated malignant melanoma, nodular type. Breslow depth 5.1 mm. Clark's level 4. Ulceration present. Mitotic index 16/mm. Pathologic stage T4b. Deep margin involved  April 01 2023- Wide local excision.  Sentinel LN bx not performed due to patient frailty  Therapeutics   Apr 03 2023- Will obtain CBC with diff, CMP, LDH, MRI brain and whole body PET/CT for staging  Patient meets criteria per NCCN Guidelines for adjuvant immunotherapy and will plan on adjuvant Pembrolizumab  May 06 2023- Chemotherapy teaching  May 13 2023:  Cycle 1 Pembrolizumab  June 03 2023:  Cycle 2 Pembrolizumab  June 24 2023:  TSH 0.31 Free T4 11.3-- Will follow as this may be due to thyroiditis from Pembrolizumab.   Cycle 3 Pembrolizumab   Immunotherapy Risks:   May 06 2023- Reiterated potential side effects of immunotherapy with the patient.  These include but are not limited to:  Fatigue, Hair loss, low blood counts (anemia, thrombocytopenia) that may necessitate transfusion, bleeding, infection, nausea/ vomiting/Appetite changes/ Constipation/ Diarrhea, mucositis, Neuropathy/ neurologic problems, skin and nail changes such as dry skin and color change, Urine and bladder changes and kidney problems, weight changes, mood changes, decreased libido/fertility problems, damage to heart and lungs.  Some of these side effects can be life threatening, may be permanent and can result in hospitalization and/or death.  In shared decision making patient has agreed to proceed with immunotherapy.    Poor IV access  Apr 29 2023- Has undergone portacath placement  Urinary frequency  May 06 2023- Likely secondary to BPH.  PSA 7.11 and began Proscar.    June 24 2023- Urinary frequency improved.  May be due to BPH with contribution of hyperglycemia  Elevated PSA  May 06 2023- PSA 7.11  June 24 2023- Referring to urology for evaluation  Cancer Staging  Malignant melanoma  (HCC) Staging form: Melanoma of the Skin, AJCC 8th Edition - Clinical stage from 04/03/2023: Stage IIC (cT4b, cN0, cM0) - Signed by Loni Muse, MD on 04/03/2023 Histopathologic type: Nodular melanoma Stage prefix: Initial diagnosis    No problem-specific Assessment & Plan notes found for this encounter.    No orders of the defined types were placed in this encounter.   30 minutes was spent in patient care.  This included time spent preparing to see the patient (e.g., review of tests), obtaining and/or reviewing separately obtained history, counseling and educating the patient/family/caregiver, ordering medications, tests, or procedures; documenting clinical information in the electronic or other health record, independently interpreting results and communicating results to the patient/family/caregiver as well as coordination of care.       All questions were answered. The patient knows to call the clinic with any problems, questions or concerns.  This note was electronically signed.    Loni Muse, MD  08/14/2023 8:39 AM

## 2023-08-14 NOTE — Telephone Encounter (Signed)
08/14/2023  Patient missed his Appt's this morning - Transferred to Scheduling to Reschedule

## 2023-08-15 ENCOUNTER — Inpatient Hospital Stay: Payer: Medicare HMO

## 2023-08-15 ENCOUNTER — Inpatient Hospital Stay: Payer: Medicare HMO | Attending: Oncology | Admitting: Oncology

## 2023-08-15 VITALS — BP 127/79 | HR 81 | Temp 97.4°F | Resp 14 | Ht 69.0 in | Wt 180.3 lb

## 2023-08-15 DIAGNOSIS — R350 Frequency of micturition: Secondary | ICD-10-CM

## 2023-08-15 DIAGNOSIS — E064 Drug-induced thyroiditis: Secondary | ICD-10-CM | POA: Diagnosis not present

## 2023-08-15 DIAGNOSIS — F05 Delirium due to known physiological condition: Secondary | ICD-10-CM | POA: Diagnosis not present

## 2023-08-15 DIAGNOSIS — C4372 Malignant melanoma of left lower limb, including hip: Secondary | ICD-10-CM | POA: Insufficient documentation

## 2023-08-15 DIAGNOSIS — Z5111 Encounter for antineoplastic chemotherapy: Secondary | ICD-10-CM | POA: Diagnosis not present

## 2023-08-15 DIAGNOSIS — Z79899 Other long term (current) drug therapy: Secondary | ICD-10-CM | POA: Diagnosis not present

## 2023-08-15 DIAGNOSIS — R972 Elevated prostate specific antigen [PSA]: Secondary | ICD-10-CM | POA: Diagnosis not present

## 2023-08-15 DIAGNOSIS — Z09 Encounter for follow-up examination after completed treatment for conditions other than malignant neoplasm: Secondary | ICD-10-CM

## 2023-08-15 LAB — CMP (CANCER CENTER ONLY)
ALT: 15 U/L (ref 0–44)
AST: 16 U/L (ref 15–41)
Albumin: 3.5 g/dL (ref 3.5–5.0)
Alkaline Phosphatase: 79 U/L (ref 38–126)
Anion gap: 9 (ref 5–15)
BUN: 13 mg/dL (ref 8–23)
CO2: 25 mmol/L (ref 22–32)
Calcium: 8.6 mg/dL — ABNORMAL LOW (ref 8.9–10.3)
Chloride: 99 mmol/L (ref 98–111)
Creatinine: 0.72 mg/dL (ref 0.61–1.24)
GFR, Estimated: >60 mL/min (ref >60)
Glucose, Bld: 178 mg/dL — ABNORMAL HIGH (ref 70–99)
Potassium: 4.1 mmol/L (ref 3.5–5.1)
Sodium: 133 mmol/L — ABNORMAL LOW (ref 135–145)
Total Bilirubin: 0.6 mg/dL (ref 0.3–1.2)
Total Protein: 6.6 g/dL (ref 6.5–8.1)

## 2023-08-15 LAB — CBC WITH DIFFERENTIAL (CANCER CENTER ONLY)
Abs Immature Granulocytes: 0.04 10*3/uL (ref 0.00–0.07)
Basophils Absolute: 0 10*3/uL (ref 0.0–0.1)
Basophils Relative: 1 %
Eosinophils Absolute: 0.3 10*3/uL (ref 0.0–0.5)
Eosinophils Relative: 4 %
HCT: 44.7 % (ref 39.0–52.0)
Hemoglobin: 14.7 g/dL (ref 13.0–17.0)
Immature Granulocytes: 1 %
Lymphocytes Relative: 35 %
Lymphs Abs: 2.9 10*3/uL (ref 0.7–4.0)
MCH: 28.7 pg (ref 26.0–34.0)
MCHC: 32.9 g/dL (ref 30.0–36.0)
MCV: 87.3 fL (ref 80.0–100.0)
Monocytes Absolute: 0.9 10*3/uL (ref 0.1–1.0)
Monocytes Relative: 11 %
Neutro Abs: 4.1 10*3/uL (ref 1.7–7.7)
Neutrophils Relative %: 48 %
Platelet Count: 342 10*3/uL (ref 150–400)
RBC: 5.12 MIL/uL (ref 4.22–5.81)
RDW: 13.9 % (ref 11.5–15.5)
WBC Count: 8.3 10*3/uL (ref 4.0–10.5)
nRBC: 0 % (ref 0.0–0.2)

## 2023-08-15 LAB — CORTISOL: Cortisol, Plasma: 8.3 ug/dL

## 2023-08-15 LAB — T4, FREE: Free T4: 0.6 ng/dL — ABNORMAL LOW (ref 0.61–1.12)

## 2023-08-15 LAB — TSH: TSH: 4.596 u[IU]/mL — ABNORMAL HIGH (ref 0.350–4.500)

## 2023-08-15 MED FILL — Pembrolizumab IV Soln 100 MG/4ML (25 MG/ML): INTRAVENOUS | Qty: 8 | Status: AC

## 2023-08-15 NOTE — Progress Notes (Signed)
[facility] Cancer Follow up Visit:  Patient Care Team: Gus Height, [place] as PCP - General (Family Medicine) [name], MD as PCP - Cardiology (Cardiology) Jenene Slicker, Deberah Castle, MD as Consulting Physician (Optometry) Olegario Shearer, MD as Referring Physician (Dermatology) Loni Muse, MD as Consulting Physician (Internal Medicine)  CHIEF COMPLAINTS/PURPOSE OF CONSULTATION:  Oncology History  Malignant melanoma (HCC)  04/03/2023 Initial Diagnosis   Malignant melanoma (HCC)   04/03/2023 Cancer Staging   Staging form: Melanoma of the Skin, AJCC 8th Edition - Clinical stage from 04/03/2023: Stage IIC (cT4b, cN0, cM0) - Signed by Loni Muse, MD on 04/03/2023 Histopathologic type: Nodular melanoma Stage prefix: Initial diagnosis   05/13/2023 -  Chemotherapy   Patient is on Treatment Plan : MELANOMA Pembrolizumab (200) q21d       HISTORY OF PRESENTING ILLNESS: Jon Nelson 77 y.o. 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  Breslow depth 5.1 mm.  Clark's level 4.  Ulceration present.  Mitotic index 16/mm.  Pathologic stage T4b.  Deep margin involved  Apr 03 2023:  [facility] Consult Patient here with sister in law [name] who is also his power of attorney.  He has had a mole on the left lateral leg for years but in the last year it degenerated into a sore which didn't heal which prompted him to see a dermatologist.  He has been followed by dermatology for removal of non-melanomatous skin cancers on his face and arms over the years.  He underwent a shave biopsy of the left leg lesion on March 19 2023.  He has not been noted to have in transit lesions.  April 01 2023 he underwent a wide local excision of the region but did not undergo  inguinal lymphadenectomy.     Social:  Widowed.  Lives with his dog.  Worked for [employer] where he worked outside.  No smoking.  EtOH none  [facility] Mother died 16 dementia Father estranged No siblings.     Apr 12 2023:  MRI brain No evidence of metastatic disease. 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  Apr 14 2023:  Whole body PET/CT Postsurgical changes in the lateral left lower leg, as above.  No findings suspicious for metastatic disease.   Apr 29 2023:   Has undergone port placement since last visit.  Has not yet been scheduled for teaching or therapy.  Discussed risks and benefits of immunotherapy    May 06 2023:   Experiencing constipation.  Has urinary frequency but not interested in seeing a urologist.  To undergo chemotherapy teaching today.  Will check PSA  May 13 2023:  Cycle 1 Pembrolizumab  June 03 2023:  Cycle 2 Pembrolizumab  June 24 2023:    LE edema improved.  Does not check FSBG's.  Sister could not come to visit today because she struck a deer while driving.    TSH 0.31 Free T4 11.3 CMP notable for glucose 276  Cycle 3 Pembrolizumab  June 30 2023:  Admitted to [facility] with mental status changes.   CT head without contrast   Generalized cerebral atrophy with chronic white matter small vessel ischemic changes.  No acute intracranial abnormality.  Carotid dopplers-   Right- moderate heterogeneous and calcified plaque,  with  no hemodynamically significant stenosis by duplex criteria in  the extracranial cerebrovascular circulation.   Left- Heterogeneous and partially calcified plaque at the left carotid bifurcation, with discordant results regarding degree of stenosis by  established duplex criteria. Peak velocity suggests 50%-69% stenosis, with the ICA/ CCA ratio suggesting a lesser degree of stenosis   CT neck angiography- No emergent vascular finding.  Widespread atherosclerosis with up to 50% left ICA bulb and  left V4 segment stenoses. Moderate heterogeneity of plaque at the left  ICA bulb  MRI head- No evidence of acute intracranial abnormality. Remote left posterior frontal cortical infarct and moderate chronic microvascular ischemic change.  Cardiac ECHO- LVEF 55-60%.  Aortic valve mildly sclerotic without stenosis.  Trivial MR.   Labs notable for Sodium 133 Glucose 150.  Urine specific gravity 1.023  TSH < 0.02 FT4 3.42 (ULN 3.42)  FT3 6.47 (ULN 5.27)  Patient was placed on methimidazole for hyperthyroidism  Patient was seen by neurology.   July 15 2023:  Scheduled follow up for melanoma   July 31 2023:    States that he feels great but does stay sleepy for awhile.  Patient can't provide much information as to what happened on the day of admission to the hospital.  Here alone today.  Holding treatment pending results of lab testing  August 15 2023:    Scheduled follow up for melanoma.  Feels well.  Worked in yard yesterday.  Has not made it to urologist.  Had nocturia x 5 last night  August 18 2023:  Cycle 4 Keytruda   Review of Systems  Constitutional:  Negative for appetite change, chills, fatigue, fever and unexpected weight change.  HENT:   Positive for hearing loss. Negative for lump/mass, mouth sores, nosebleeds, sore throat and trouble swallowing.        Deaf left ear  Eyes:  Positive for eye problems. Negative for icterus.       Vision changes:  None  Respiratory:  Negative for chest tightness, cough, hemoptysis and wheezing.        DOE on walking up hill  Cardiovascular:  Positive for leg swelling. Negative for chest pain and palpitations.       PND:  none Orthopnea:  none  Gastrointestinal:  Negative for abdominal distention, abdominal pain, blood in stool, constipation, diarrhea, nausea and vomiting.  Endocrine: Negative for hot flashes.       Cold intolerance:  none Heat intolerance:  none  Genitourinary:  Positive for frequency. Negative for bladder  incontinence, difficulty urinating, dysuria and hematuria.        Nocturia x 3 to 4  Musculoskeletal:  Negative for arthralgias, back pain, gait problem, myalgias, neck pain and neck stiffness.  Skin:  Negative for rash.       Chronic pruritus of arms.  Healing from surgery to left leg  Neurological:  Negative for extremity weakness, gait problem, headaches, numbness and speech difficulty.       Occasional dizziness particularly when bending over.  No falls   Hematological:  Negative for adenopathy. Does not bruise/bleed easily.  Psychiatric/Behavioral:  Negative for sleep disturbance and suicidal ideas. The patient is not nervous/anxious.     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)    Old myocardial infarct    Other amnesia    TIA (transient ischemic attack)     SURGICAL HISTORY: Past Surgical History:  Procedure Laterality Date   CATARACT EXTRACTION Left 08/15/2020   CORONARY ARTERY BYPASS GRAFT N/A 09/24/2021   Procedure: CORONARY ARTERY BYPASS GRAFTING (CABG) TIMES THREE , ON PUMP, USING LEFT INTERNAL MAMMARY ARTERY AND ENDOSCOPICALLY HARVESTED RIGHT GREATER SAPHENOUS VEIN;  Surgeon: Corliss Skains, MD;  Location: [facility] OR;  Service: Open Heart Surgery;  Laterality: N/A;   ENDOVEIN HARVEST OF GREATER SAPHENOUS VEIN Right 09/24/2021   Procedure: ENDOVEIN HARVEST OF GREATER SAPHENOUS VEIN;  Surgeon: Corliss Skains, MD;  Location: [facility] OR;  Service: Open Heart Surgery;  Laterality: Right;   HEMORRHOID SURGERY     HERNIA REPAIR  September 17, 2020   LEFT HEART CATH AND CORONARY ANGIOGRAPHY N/A 09/18/2021   Procedure: LEFT HEART CATH AND CORONARY ANGIOGRAPHY;  Surgeon:  Lyn Records, MD;  Location: [facility] INVASIVE CV LAB;  Service: Cardiovascular;  Laterality: N/A;   LOOP RECORDER INSERTION N/A 02/03/2019   Procedure: LOOP RECORDER INSERTION;  Surgeon: Regan Lemming, MD;  Location: [facility] INVASIVE CV LAB;  Service: Cardiovascular;  Laterality: N/A;   MOLE REMOVAL     PORTA CATH INSERTION     SKIN CANCER EXCISION     TEE WITHOUT CARDIOVERSION N/A 02/03/2019   Procedure: TRANSESOPHAGEAL ECHOCARDIOGRAM (TEE);  Surgeon: Jake Bathe, MD;  Location: [facility] ENDOSCOPY;  Service: Cardiovascular;  Laterality: N/A;  loop   TEE WITHOUT CARDIOVERSION N/A 09/24/2021   Procedure: TRANSESOPHAGEAL ECHOCARDIOGRAM (TEE);  Surgeon: Corliss Skains, MD;  Location: [facility] OR;  Service: Open Heart Surgery;  Laterality: N/A;    SOCIAL HISTORY: Social History   Socioeconomic History   Marital status: Widowed    Spouse name: Not on file   Number of children: 0   Years of education: Not on file   Highest education level: Not on file  Occupational History   Occupation: Retired Brewing technologist)  Tobacco Use   Smoking status: Never   Smokeless tobacco: Never  Vaping Use   Vaping status: Never Used  Substance and Sexual Activity   Alcohol use: Never   Drug use: Never   Sexual activity: Not Currently  Other Topics Concern   Not on file  Social History Narrative   Patient's wife passed away in 09/18/15 due to cancer - she was unable to have children.  Patient has a sister, his niece and her family lives in his home temporarily    Social Determinants of Health   Financial Resource Strain: Low Risk  (01/16/2022)   Overall Financial Resource Strain (CARDIA)    Difficulty of Paying Living Expenses: Not hard at all  Food Insecurity: No Food Insecurity (04/03/2023)   Hunger Vital Sign    Worried About Running Out of Food in the Last Year: Never true    Ran Out of Food in the Last Year: Never true  Transportation Needs: No Transportation Needs (01/16/2022)   PRAPARE - Therapist, art (Medical): No    Lack of Transportation (Non-Medical): No  Physical Activity: Not on file  Stress: Not on file  Social Connections: Not on file  Intimate Partner Violence: Not At Risk (04/03/2023)   Humiliation, Afraid, Rape, and Kick questionnaire    Fear of Current or Ex-Partner: No    Emotionally Abused: No    Physically Abused:  No    Sexually Abused: No    FAMILY HISTORY Family History  Problem Relation Age of Onset   Stroke Mother    Alzheimer's disease Mother     ALLERGIES:  is allergic to aricept [donepezil].  MEDICATIONS:  Current Outpatient Medications  Medication Sig Dispense Refill   acetaminophen (TYLENOL) 500 MG tablet Take 500 mg by mouth every 6 (six) hours as needed for mild pain or moderate pain. (Patient not taking: Reported on 06/24/2023)     aspirin EC 81 MG EC tablet Take 1 tablet (81 mg total) by mouth daily. Swallow whole. (Patient not taking: Reported on 06/24/2023)     clopidogrel (PLAVIX) 75 MG tablet Take 1 tablet (75 mg total) by mouth daily. 30 tablet 1   finasteride (PROSCAR) 5 MG tablet Take 1 tablet (5 mg total) by mouth daily. 30 tablet 5   meloxicam (MOBIC) 15 MG tablet Take 15 mg by mouth daily.     metFORMIN (GLUCOPHAGE) 1000 MG tablet Take 1,000 mg by mouth 2 (two) times daily.     metoprolol tartrate (LOPRESSOR) 25 MG tablet Take 0.5 tablets (12.5 mg total) by mouth 2 (two) times daily. 30 tablet 1   ondansetron (ZOFRAN) 8 MG tablet Take 1 tablet (8 mg total) by mouth every 8 (eight) hours as needed for nausea or vomiting. (Patient not taking: Reported on 06/24/2023) 30 tablet 1   prochlorperazine (COMPAZINE) 10 MG tablet Take 1 tablet (10 mg total) by mouth every 6 (six) hours as needed for nausea or vomiting. (Patient not taking: Reported on 06/24/2023) 30 tablet 1   rosuvastatin (CRESTOR) 20 MG tablet Take 20 mg by mouth daily.     traMADol (ULTRAM) 50 MG tablet Take 50 mg by mouth every 6 (six) hours as needed. (Patient not  taking: Reported on 06/24/2023)     No current facility-administered medications for this visit.    PHYSICAL EXAMINATION:  ECOG PERFORMANCE STATUS: 1 - Symptomatic but completely ambulatory   There were no vitals filed for this visit.    There were no vitals filed for this visit.     Physical Exam Vitals and nursing note reviewed.  Constitutional:      Appearance: Normal appearance. He is not diaphoretic.     Comments: Here alone  HENT:     Head: Normocephalic and atraumatic.     Right Ear: External ear normal.     Left Ear: External ear normal.     Nose: Nose normal.  Eyes:     Conjunctiva/sclera: Conjunctivae normal.     Pupils: Pupils are equal, round, and reactive to light.  Cardiovascular:     Rate and Rhythm: Normal rate and regular rhythm.     Heart sounds:     No friction rub. No gallop.  Pulmonary:     Effort: Pulmonary effort is normal. No respiratory distress.     Breath sounds: Normal breath sounds. No stridor.  Abdominal:     General: Abdomen is flat. There is no distension.     Palpations: Abdomen is soft.     Tenderness: There is no abdominal tenderness. There is no guarding.  Musculoskeletal:        General: No swelling. Normal range of motion.     Cervical back: Normal range of motion and neck supple. No rigidity or tenderness.     Right lower leg: No edema.     Left lower leg: No edema.  Lymphadenopathy:     Head:  Right side of head: No submental, submandibular, tonsillar, preauricular, posterior auricular or occipital adenopathy.     Left side of head: No submental, submandibular, tonsillar, preauricular, posterior auricular or occipital adenopathy.     Cervical: No cervical adenopathy.     Right cervical: No superficial, deep or posterior cervical adenopathy.    Left cervical: No superficial, deep or posterior cervical adenopathy.     Upper Body:     Right upper body: No supraclavicular or axillary adenopathy.     Left upper body: No  supraclavicular or axillary adenopathy.     Lower Body: No right inguinal adenopathy. No left inguinal adenopathy.  Skin:    Coloration: Skin is not jaundiced.     Findings: No bruising or erythema.     Comments: Fitzpatrick 2 skin tone.  Significant solar damage to face and arms.   Left leg bandaged.    Neurological:     General: No focal deficit present.     Mental Status: He is alert and oriented to person, place, and time.     Comments: Hard of hearing  Psychiatric:        Mood and Affect: Mood normal.        Behavior: Behavior normal.        Thought Content: Thought content normal.        Judgment: Judgment normal.     LABORATORY DATA: I have personally reviewed the data as listed:  Appointment on 07/31/2023  Component Date Value Ref Range Status   Thyroglobulin Antibody 07/31/2023 <1.0  0.0 - 0.9 IU/mL Final   Comment: (NOTE) Thyroglobulin Antibody measured by [employer] Coulter Methodology It should be noted that the presence of thyroglobulin antibodies may not be pathogenic nor diagnostic, especially at very low levels. The assay manufacturer has found that four percent of individuals without evidence of thyroid disease or autoimmunity will have positive TgAb levels up to 4 IU/mL. Performed At: [facility] [address] [place], [place] [id number] Jolene Schimke MD WU:[id number]    Interpretation 07/31/2023 Negative  Negative Final   Comment: (NOTE) A negative paraneoplastic autoantibody result does not rule out all clinically relevant antibodies. If indicated, a phenotype- specific profile (For example, Encephalopathy, dementia, myelopathy, or axonal neuropathy) should be considered.    Anti-Hu Ab 07/31/2023 Negative  Negative Final   Comment: (NOTE) This test was developed and its performance characteristics determined by [facility]. It has not been cleared or approved by the Food and Drug Administration.    Anti-Ri Ab 07/31/2023 Negative  Negative Final    Comment: (NOTE) This test was developed and its performance characteristics determined by [facility]. It has not been cleared or approved by the Food and Drug Administration.    Antineruonal nuclear Ab Type 3 07/31/2023 Negative  Negative Final   Comment: (NOTE) This test was developed and its performance characteristics determined by [facility]. It has not been cleared or approved by the Food and Drug Administration.    Anti-Yo Ab 07/31/2023 Negative  Negative Final   Comment: (NOTE) This test was developed and its performance characteristics determined by [facility]. It has not been cleared or approved by the Food and Drug Administration.    Purkinje Cell Cyto Ab Type 2 07/31/2023 Negative  Negative Final   Comment: (NOTE) This test was developed and its performance characteristics determined by [facility]. It has not been cleared or approved by the Food and Drug Administration.    Purkinje Cell Cyto Ab Type Tr 07/31/2023 Negative  Negative Final  Comment: (NOTE) This test was developed and its performance characteristics determined by [facility]. It has not been cleared or approved by the Food and Drug Administration.    Amphiphysin Antibody 07/31/2023 Negative  Negative Final   Comment: (NOTE) This test was developed and its performance characteristics determined by [facility]. It has not been cleared or approved by the Food and Drug Administration.    CRMP-5 IgG 07/31/2023 Negative  Negative Final   Comment: (NOTE) This test was developed and its performance characteristics determined by [facility]. It has not been cleared or approved by the Food and Drug Administration.    AGNA-1 07/31/2023 Negative  Negative Final   Comment: (NOTE) This test was developed and its performance characteristics determined by [facility]. It has not been cleared or approved by the Food and Drug Administration.    VGCC Antibody 07/31/2023 <1.0  0.0 - 30.0 pmol/L Final   CASPR2 Antibody,Cell-based IFA  07/31/2023 Negative  Negative Final   Comment: (NOTE) This test was developed and its performance characteristics determined by [facility]. It has not been cleared or approved by the Food and Drug Administration.    LGI1 Antibody, Cell-based IFA 07/31/2023 Negative  Negative Final   Comment: (NOTE) This test was developed and its performance characteristics determined by [facility]. It has not been cleared or approved by the Food and Drug Administration. Performed At: [facility] [address] [place], [place] [id number] Jolene Schimke MD NW:[id number]    Free T4 07/31/2023 1.11  0.61 - 1.12 ng/dL Final   Comment: (NOTE) Biotin ingestion may interfere with free T4 tests. If the results are inconsistent with the TSH level, previous test results, or the clinical presentation, then consider biotin interference. If needed, order repeat testing after stopping biotin. Performed at [facility], [address] [place], [place] [address]    TSH 07/31/2023 0.081 (L)  0.350 - 4.500 uIU/mL Final   Comment: Performed by a 3rd Generation assay with a functional sensitivity of <=0.01 uIU/mL. Performed at [facility], [address], [place], [place] [address]    Sodium 07/31/2023 135  135 - 145 mmol/L Final   Potassium 07/31/2023 4.1  3.5 - 5.1 mmol/L Final   Chloride 07/31/2023 107  98 - 111 mmol/L Final   CO2 07/31/2023 21 (L)  22 - 32 mmol/L Final   Glucose, Bld 07/31/2023 151 (H)  70 - 99 mg/dL Final   Glucose reference range applies only to samples taken after fasting for at least 8 hours.   BUN 07/31/2023 13  8 - 23 mg/dL Final   Creatinine, Ser 07/31/2023 0.56 (L)  0.61 - 1.24 mg/dL Final   Calcium 28/41/3244 8.6 (L)  8.9 - 10.3 mg/dL Final   Total Protein 12/04/7251 6.4 (L)  6.5 - 8.1 g/dL Final   Albumin 66/44/0347 3.3 (L)  3.5 - 5.0 g/dL Final   AST 42/59/5638 15  15 - 41 U/L Final   ALT 07/31/2023 13  0 - 44 U/L Final   Alkaline Phosphatase  07/31/2023 74  38 - 126 U/L Final   Total Bilirubin 07/31/2023 0.7  0.3 - 1.2 mg/dL Final   GFR, Estimated 07/31/2023 >60  >60 mL/min Final   Comment: (NOTE) Calculated using the CKD-EPI Creatinine Equation (2021)    Anion gap 07/31/2023 7  5 - 15 Final   Performed at [facility], [address] [place], [place] [address]   WBC 07/31/2023 10.1  4.0 - 10.5 K/uL Final   RBC  07/31/2023 4.92  4.22 - 5.81 MIL/uL Final   Hemoglobin 07/31/2023 14.3  13.0 - 17.0 g/dL Final   HCT 96/03/5408 43.1  39.0 - 52.0 % Final   MCV 07/31/2023 87.6  80.0 - 100.0 fL Final   MCH 07/31/2023 29.1  26.0 - 34.0 pg Final   MCHC 07/31/2023 33.2  30.0 - 36.0 g/dL Final   RDW 81/19/1478 13.5  11.5 - 15.5 % Final   Platelets 07/31/2023 332  150 - 400 K/uL Final   nRBC 07/31/2023 0.0  0.0 - 0.2 % Final   Neutrophils Relative % 07/31/2023 50  % Final   Neutro Abs 07/31/2023 5.1  1.7 - 7.7 K/uL Final   Lymphocytes Relative 07/31/2023 34  % Final   Lymphs Abs 07/31/2023 3.5  0.7 - 4.0 K/uL Final   Monocytes Relative 07/31/2023 11  % Final   Monocytes Absolute 07/31/2023 1.1 (H)  0.1 - 1.0 K/uL Final   Eosinophils Relative 07/31/2023 3  % Final   Eosinophils Absolute 07/31/2023 0.3  0.0 - 0.5 K/uL Final   Basophils Relative 07/31/2023 1  % Final   Basophils Absolute 07/31/2023 0.1  0.0 - 0.1 K/uL Final   Immature Granulocytes 07/31/2023 1  % Final   Abs Immature Granulocytes 07/31/2023 0.05  0.00 - 0.07 K/uL Final   Performed at [facility], [address], [place], [place] [address]    RADIOGRAPHIC STUDIES: I have personally reviewed the radiological images as listed and agree with the findings in the report  No results found.  ASSESSMENT/PLAN 77 y.o. male is here because of  malignant melanoma.  Medical history notable for diabetes mellitus, coronary artery disease, cerebrovascular disease, MI, cataracts  Malignant melanoma, distal left leg, Stage IIC (T4b N0 M0)- Risk  factors for this patient are 1) chronic exposure to UV 2) Sunburns 3) Fitzpatrick Grade 2 skin tone March 19, 2023: Shave biopsy left lateral leg demonstrated malignant melanoma, nodular type. Breslow depth 5.1 mm. Clark's level 4. Ulceration present. Mitotic index 16/mm. Pathologic stage T4b. Deep margin involved  April 01 2023- Wide local excision.  Sentinel LN bx not performed due to patient frailty  Therapeutics   Apr 03 2023- Will obtain CBC with diff, CMP, LDH, MRI brain and whole body PET/CT for staging  Patient meets criteria per NCCN Guidelines for adjuvant immunotherapy and will plan on adjuvant Pembrolizumab  May 06 2023- Chemotherapy teaching  May 13 2023:  Cycle 1 Pembrolizumab  June 03 2023:  Cycle 2 Pembrolizumab  June 24 2023:  TSH 0.31 Free T4 11.3-- Will follow as this may be due to thyroiditis from Pembrolizumab.   Cycle 3 Pembrolizumab  July 31 2923- Pembrolizumab currently on hold due to recent bout of mental status changes. Rechecked thyroid studies- TSH 0.81 F4 1.11 Thyroglobulin Ab negative.  Stopped  methimidazole as he does not have thyrotoxicosis.  August 15 2023- Appears to be back at baseline.  Will resume Keytruda.  Rechecking thyroid studies August 18 2023:  Cycle 4 Keytruda   Immunotherapy Risks:   May 06 2023- Reiterated potential side effects of immunotherapy with the patient.  These include but are not limited to:  Fatigue, Hair loss, low blood counts (anemia, thrombocytopenia) that may necessitate transfusion, bleeding, infection, nausea/ vomiting/Appetite changes/ Constipation/ Diarrhea, mucositis, Neuropathy/ neurologic problems, skin and nail changes such as dry skin and color change, Urine and bladder changes and kidney problems, weight changes, mood changes, decreased libido/fertility problems, damage to heart and lungs.  Some  of these side effects can be life threatening, may be permanent and can result in hospitalization and/or death.  In shared  decision making patient has agreed to proceed with immunotherapy.    Poor IV access  Apr 29 2023- Underwent portacath placement  Urinary frequency  May 06 2023- Likely secondary to BPH.  PSA 7.11 and began Proscar.    June 24 2023- Urinary frequency improved.  May be due to BPH with contribution of hyperglycemia  August 15 2023- Has not yet seen urology.  Will request again.    Elevated PSA  May 06 2023- PSA 7.11  June 24 2023- Referred to urology for evaluation       Cancer Staging  Malignant melanoma [facility] Staging form: Melanoma of the Skin, AJCC 8th Edition - Clinical stage from 04/03/2023: Stage IIC (cT4b, cN0, cM0) - Signed by Loni Muse, MD on 04/03/2023 Histopathologic type: Nodular melanoma Stage prefix: Initial diagnosis    No problem-specific Assessment & Plan notes found for this encounter.    No orders of the defined types were placed in this encounter.   30 minutes was spent in patient care.  This included time spent preparing to see the patient (e.g., review of tests), obtaining and/or reviewing separately obtained history, counseling and educating the patient/family/caregiver, ordering medications, tests, or procedures; documenting clinical information in the electronic or other health record, independently interpreting results and communicating results to the patient/family/caregiver as well as coordination of care.       All questions were answered. The patient knows to call the clinic with any problems, questions or concerns.  This note was electronically signed.    Loni Muse, MD  08/15/2023 8:26 AM

## 2023-08-18 ENCOUNTER — Inpatient Hospital Stay: Payer: Medicare HMO

## 2023-08-18 VITALS — BP 147/68 | HR 72 | Temp 97.7°F | Resp 20 | Ht 69.0 in | Wt 182.0 lb

## 2023-08-18 DIAGNOSIS — Z79899 Other long term (current) drug therapy: Secondary | ICD-10-CM | POA: Diagnosis not present

## 2023-08-18 DIAGNOSIS — C4372 Malignant melanoma of left lower limb, including hip: Secondary | ICD-10-CM

## 2023-08-18 DIAGNOSIS — Z5111 Encounter for antineoplastic chemotherapy: Secondary | ICD-10-CM | POA: Diagnosis not present

## 2023-08-18 DIAGNOSIS — R972 Elevated prostate specific antigen [PSA]: Secondary | ICD-10-CM | POA: Diagnosis not present

## 2023-08-18 MED ORDER — SODIUM CHLORIDE 0.9 % IV SOLN: Freq: Once | INTRAVENOUS | Status: AC

## 2023-08-18 MED ORDER — SODIUM CHLORIDE 0.9% FLUSH
10.0000 mL | INTRAVENOUS | Status: DC | PRN
  Administered 2023-08-18: 10 mL

## 2023-08-18 MED ORDER — SODIUM CHLORIDE 0.9 % IV SOLN
200.0000 mg | Freq: Once | INTRAVENOUS | Status: AC
  Administered 2023-08-18: 200 mg via INTRAVENOUS
  Filled 2023-08-18 (×2): qty 8

## 2023-08-18 MED ORDER — HEPARIN SOD (PORK) LOCK FLUSH 100 UNIT/ML IV SOLN
500.0000 [IU] | Freq: Once | INTRAVENOUS | Status: AC | PRN
  Administered 2023-08-18: 500 [IU]

## 2023-08-18 NOTE — Patient Instructions (Signed)

## 2023-08-20 DIAGNOSIS — N401 Enlarged prostate with lower urinary tract symptoms: Secondary | ICD-10-CM | POA: Diagnosis not present

## 2023-08-20 DIAGNOSIS — R972 Elevated prostate specific antigen [PSA]: Secondary | ICD-10-CM | POA: Diagnosis not present

## 2023-08-20 DIAGNOSIS — R351 Nocturia: Secondary | ICD-10-CM | POA: Diagnosis not present

## 2023-08-22 ENCOUNTER — Other Ambulatory Visit: Payer: Medicare HMO

## 2023-08-22 ENCOUNTER — Ambulatory Visit: Payer: Medicare HMO | Admitting: Oncology

## 2023-08-25 ENCOUNTER — Encounter: Payer: Self-pay | Admitting: Oncology

## 2023-08-26 ENCOUNTER — Ambulatory Visit: Payer: Medicare HMO

## 2023-09-02 ENCOUNTER — Other Ambulatory Visit: Payer: Self-pay | Admitting: Oncology

## 2023-09-02 NOTE — Progress Notes (Deleted)
[facility] Cancer Follow up Visit:  Patient Care Team: Gus Height, [place] as PCP - General (Family Medicine) [name], MD as PCP - Cardiology (Cardiology) Jenene Slicker, Deberah Castle, MD as Consulting Physician (Optometry) Olegario Shearer, MD as Referring Physician (Dermatology) Loni Muse, MD as Consulting Physician (Internal Medicine)  CHIEF COMPLAINTS/PURPOSE OF CONSULTATION:  Oncology History  Malignant melanoma (HCC)  04/03/2023 Initial Diagnosis   Malignant melanoma (HCC)   04/03/2023 Cancer Staging   Staging form: Melanoma of the Skin, AJCC 8th Edition - Clinical stage from 04/03/2023: Stage IIC (cT4b, cN0, cM0) - Signed by Loni Muse, MD on 04/03/2023 Histopathologic type: Nodular melanoma Stage prefix: Initial diagnosis   05/13/2023 -  Chemotherapy   Patient is on Treatment Plan : MELANOMA Pembrolizumab (200) q21d       HISTORY OF PRESENTING ILLNESS: Jon Nelson 77 y.o. 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  Breslow depth 5.1 mm.  Clark's level 4.  Ulceration present.  Mitotic index 16/mm.  Pathologic stage T4b.  Deep margin involved  Apr 03 2023:  [facility] Medical Oncology Consult Patient here with sister in law [name] who is also his power of attorney.  He has had a mole on the left lateral leg for years but in the last year it degenerated into a sore which didn't heal which prompted him to see a dermatologist.  He has been followed by dermatology for removal of non-melanomatous skin cancers on his face and arms over the years.  He underwent a shave biopsy of the left leg lesion on March 19 2023.  He has not been noted to have in transit lesions.  April 01 2023 he underwent a wide local excision of the region but did not undergo  inguinal lymphadenectomy.     Social:  Widowed.  Lives with his dog.  Worked for [employer] where he worked outside.  No smoking.  EtOH none  [facility] Mother died 46 dementia Father estranged No siblings.     Apr 12 2023:  MRI brain No evidence of metastatic disease. 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  Apr 14 2023:  Whole body PET/CT Postsurgical changes in the lateral left lower leg, as above.  No findings suspicious for metastatic disease.   Apr 29 2023:   Has undergone port placement since last visit.  Has not yet been scheduled for teaching or therapy.  Discussed risks and benefits of immunotherapy    May 06 2023:   Experiencing constipation.  Has urinary frequency but not interested in seeing a urologist.  To undergo chemotherapy teaching today.  Will check PSA  May 13 2023:  Cycle 1 Pembrolizumab  June 03 2023:  Cycle 2 Pembrolizumab  June 24 2023:    LE edema improved.  Does not check FSBG's.  Sister could not come to visit today because she struck a deer while driving.    TSH 0.31 Free T4 11.3 CMP notable for glucose 276  Cycle 3 Pembrolizumab  June 30 2023:  Admitted to [facility] with mental status changes.   CT head without contrast   Generalized cerebral atrophy with chronic white matter small vessel ischemic changes.  No acute intracranial abnormality.  Carotid dopplers-   Right- moderate heterogeneous and calcified plaque,  with  no hemodynamically significant stenosis by duplex criteria in  the extracranial cerebrovascular circulation.   Left- Heterogeneous and partially calcified plaque at the left carotid bifurcation, with discordant results regarding degree of stenosis by  established duplex criteria. Peak velocity suggests 50%-69% stenosis, with the ICA/ CCA ratio suggesting a lesser degree of stenosis   CT neck angiography- No emergent vascular finding.  Widespread atherosclerosis with up to 50% left ICA bulb and  left V4 segment stenoses. Moderate heterogeneity of plaque at the left  ICA bulb  MRI head- No evidence of acute intracranial abnormality. Remote left posterior frontal cortical infarct and moderate chronic microvascular ischemic change.  Cardiac ECHO- LVEF 55-60%.  Aortic valve mildly sclerotic without stenosis.  Trivial MR.   Labs notable for Sodium 133 Glucose 150.  Urine specific gravity 1.023  TSH < 0.02 FT4 3.42 (ULN 3.42)  FT3 6.47 (ULN 5.27)  Patient was placed on methimidazole for hyperthyroidism  Patient was seen by neurology.   July 15 2023:  Scheduled follow up for melanoma   July 31 2023:    States that he feels great but does stay sleepy for awhile.  Patient can't provide much information as to what happened on the day of admission to the hospital.  Here alone today.  Holding treatment pending results of lab testing  August 15 2023:    Scheduled follow up for melanoma.  Feels well.  Worked in yard yesterday.  Has not made it to urologist.  Had nocturia x 5 last night  August 18 2023:  Cycle 4 Keytruda   Review of Systems  Constitutional:  Negative for appetite change, chills, fatigue, fever and unexpected weight change.  HENT:   Positive for hearing loss. Negative for lump/mass, mouth sores, nosebleeds, sore throat and trouble swallowing.        Deaf left ear  Eyes:  Positive for eye problems. Negative for icterus.       Vision changes:  None  Respiratory:  Negative for chest tightness, cough, hemoptysis and wheezing.        DOE on walking up hill  Cardiovascular:  Positive for leg swelling. Negative for chest pain and palpitations.       PND:  none Orthopnea:  none  Gastrointestinal:  Negative for abdominal distention, abdominal pain, blood in stool, constipation, diarrhea, nausea and vomiting.  Endocrine: Negative for hot flashes.       Cold intolerance:  none Heat intolerance:  none  Genitourinary:  Positive for frequency. Negative for bladder  incontinence, difficulty urinating, dysuria and hematuria.        Nocturia x 3 to 4  Musculoskeletal:  Negative for arthralgias, back pain, gait problem, myalgias, neck pain and neck stiffness.  Skin:  Negative for rash.       Chronic pruritus of arms.  Healing from surgery to left leg  Neurological:  Negative for extremity weakness, gait problem, headaches, numbness and speech difficulty.       Occasional dizziness particularly when bending over.  No falls   Hematological:  Negative for adenopathy. Does not bruise/bleed easily.  Psychiatric/Behavioral:  Negative for sleep disturbance and suicidal ideas. The patient is not nervous/anxious.     MEDICAL HISTORY: Past Medical History:  Diagnosis Date  . Carotid artery stenosis   . Coronary artery stenosis   . Diabetes (HCC)   . Diabetes mellitus with cardiac complication (HCC)   . GI bleed   . HLD (hyperlipidemia)   . HTN (hypertension)   . Mixed hyperlipidemia   .  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 Old myocardial infarct   . Other amnesia   . TIA (transient ischemic attack)     SURGICAL HISTORY: Past Surgical History:  Procedure Laterality Date  . CATARACT EXTRACTION Left 08/15/2020  . CORONARY ARTERY BYPASS GRAFT N/A 09/24/2021   Procedure: CORONARY ARTERY BYPASS GRAFTING (CABG) TIMES THREE , ON PUMP, USING LEFT INTERNAL MAMMARY ARTERY AND ENDOSCOPICALLY HARVESTED RIGHT GREATER SAPHENOUS VEIN;  Surgeon: Corliss Skains, MD;  Location: [facility] OR;  Service: Open Heart Surgery;  Laterality: N/A;  . ENDOVEIN HARVEST OF GREATER SAPHENOUS VEIN Right 09/24/2021   Procedure: ENDOVEIN HARVEST OF GREATER SAPHENOUS VEIN;  Surgeon: Corliss Skains, MD;  Location: [facility] OR;  Service: Open Heart Surgery;  Laterality: Right;  . HEMORRHOID SURGERY    . HERNIA REPAIR  Oct 16, 2020  . LEFT HEART CATH AND CORONARY ANGIOGRAPHY N/A 09/18/2021   Procedure: LEFT HEART CATH AND CORONARY  ANGIOGRAPHY;  Surgeon: Lyn Records, MD;  Location: [facility] INVASIVE CV LAB;  Service: Cardiovascular;  Laterality: N/A;  . LOOP RECORDER INSERTION N/A 02/03/2019   Procedure: LOOP RECORDER INSERTION;  Surgeon: Regan Lemming, MD;  Location: [facility] INVASIVE CV LAB;  Service: Cardiovascular;  Laterality: N/A;  . MOLE REMOVAL    . PORTA CATH INSERTION    . SKIN CANCER EXCISION    . TEE WITHOUT CARDIOVERSION N/A 02/03/2019   Procedure: TRANSESOPHAGEAL ECHOCARDIOGRAM (TEE);  Surgeon: Jake Bathe, MD;  Location: [facility] ENDOSCOPY;  Service: Cardiovascular;  Laterality: N/A;  loop  . TEE WITHOUT CARDIOVERSION N/A 09/24/2021   Procedure: TRANSESOPHAGEAL ECHOCARDIOGRAM (TEE);  Surgeon: Corliss Skains, MD;  Location: [facility] OR;  Service: Open Heart Surgery;  Laterality: N/A;    SOCIAL HISTORY: Social History   Socioeconomic History  . Marital status: Widowed    Spouse name: Not on file  . Number of children: 0  . Years of education: Not on file  . Highest education level: Not on file  Occupational History  . Occupation: Retired Brewing technologist)  Tobacco Use  . Smoking status: Never  . Smokeless tobacco: Never  Vaping Use  . Vaping status: Never Used  Substance and Sexual Activity  . Alcohol use: Never  . Drug use: Never  . Sexual activity: Not Currently  Other Topics Concern  . Not on file  Social History Narrative   Patient's wife passed away in 2015/10/17 due to cancer - she was unable to have children.  Patient has a sister, his niece and her family lives in his home temporarily    Social Determinants of Health   Financial Resource Strain: Low Risk  (01/16/2022)   Overall Financial Resource Strain (CARDIA)   . Difficulty of Paying Living Expenses: Not hard at all  Food Insecurity: No Food Insecurity (04/03/2023)   Hunger Vital Sign   . Worried About Programme researcher, broadcasting/film/video in the Last Year: Never true   . Ran Out of Food in the Last Year: Never true  Transportation Needs: No Transportation Needs  (01/16/2022)   PRAPARE - Transportation   . Lack of Transportation (Medical): No   . Lack of Transportation (Non-Medical): No  Physical Activity: Not on file  Stress: Not on file  Social Connections: Not on file  Intimate Partner Violence: Not At Risk (04/03/2023)   Humiliation, Afraid, Rape, and Kick questionnaire   . Fear of Current or Ex-Partner: No   . Emotionally Abused: No   . Physically Abused:  No   . Sexually Abused: No    FAMILY HISTORY Family History  Problem Relation Age of Onset  . Stroke Mother   . Alzheimer's disease Mother     ALLERGIES:  is allergic to aricept [donepezil].  MEDICATIONS:  Current Outpatient Medications  Medication Sig Dispense Refill  . acetaminophen (TYLENOL) 500 MG tablet Take 500 mg by mouth every 6 (six) hours as needed for mild pain or moderate pain. (Patient not taking: Reported on 06/24/2023)    . aspirin EC 81 MG EC tablet Take 1 tablet (81 mg total) by mouth daily. Swallow whole. (Patient not taking: Reported on 06/24/2023)    . clopidogrel (PLAVIX) 75 MG tablet Take 1 tablet (75 mg total) by mouth daily. 30 tablet 1  . finasteride (PROSCAR) 5 MG tablet Take 1 tablet (5 mg total) by mouth daily. 30 tablet 5  . meloxicam (MOBIC) 15 MG tablet Take 15 mg by mouth daily.    . metFORMIN (GLUCOPHAGE) 1000 MG tablet Take 1,000 mg by mouth 2 (two) times daily.    . metoprolol tartrate (LOPRESSOR) 25 MG tablet Take 0.5 tablets (12.5 mg total) by mouth 2 (two) times daily. 30 tablet 1  . ondansetron (ZOFRAN) 8 MG tablet Take 1 tablet (8 mg total) by mouth every 8 (eight) hours as needed for nausea or vomiting. (Patient not taking: Reported on 06/24/2023) 30 tablet 1  . prochlorperazine (COMPAZINE) 10 MG tablet Take 1 tablet (10 mg total) by mouth every 6 (six) hours as needed for nausea or vomiting. (Patient not taking: Reported on 06/24/2023) 30 tablet 1  . rosuvastatin (CRESTOR) 20 MG tablet Take 20 mg by mouth daily.    . traMADol (ULTRAM) 50 MG tablet  Take 50 mg by mouth every 6 (six) hours as needed. (Patient not taking: Reported on 06/24/2023)     No current facility-administered medications for this visit.    PHYSICAL EXAMINATION:  ECOG PERFORMANCE STATUS: 1 - Symptomatic but completely ambulatory   There were no vitals filed for this visit.    There were no vitals filed for this visit.     Physical Exam Vitals and nursing note reviewed.  Constitutional:      Appearance: Normal appearance. He is not diaphoretic.     Comments: Here alone  HENT:     Head: Normocephalic and atraumatic.     Right Ear: External ear normal.     Left Ear: External ear normal.     Nose: Nose normal.  Eyes:     Conjunctiva/sclera: Conjunctivae normal.     Pupils: Pupils are equal, round, and reactive to light.  Cardiovascular:     Rate and Rhythm: Normal rate and regular rhythm.     Heart sounds:     No friction rub. No gallop.  Pulmonary:     Effort: Pulmonary effort is normal. No respiratory distress.     Breath sounds: Normal breath sounds. No stridor.  Abdominal:     General: Abdomen is flat. There is no distension.     Palpations: Abdomen is soft.     Tenderness: There is no abdominal tenderness. There is no guarding.  Musculoskeletal:        General: No swelling. Normal range of motion.     Cervical back: Normal range of motion and neck supple. No rigidity or tenderness.     Right lower leg: No edema.     Left lower leg: No edema.  Lymphadenopathy:     Head:  Right side of head: No submental, submandibular, tonsillar, preauricular, posterior auricular or occipital adenopathy.     Left side of head: No submental, submandibular, tonsillar, preauricular, posterior auricular or occipital adenopathy.     Cervical: No cervical adenopathy.     Right cervical: No superficial, deep or posterior cervical adenopathy.    Left cervical: No superficial, deep or posterior cervical adenopathy.     Upper Body:     Right upper body: No  supraclavicular or axillary adenopathy.     Left upper body: No supraclavicular or axillary adenopathy.     Lower Body: No right inguinal adenopathy. No left inguinal adenopathy.  Skin:    Coloration: Skin is not jaundiced.     Findings: No bruising or erythema.     Comments: Fitzpatrick 2 skin tone.  Significant solar damage to face and arms.   Left leg bandaged.    Neurological:     General: No focal deficit present.     Mental Status: He is alert and oriented to person, place, and time.     Comments: Hard of hearing  Psychiatric:        Mood and Affect: Mood normal.        Behavior: Behavior normal.        Thought Content: Thought content normal.        Judgment: Judgment normal.    LABORATORY DATA: I have personally reviewed the data as listed:  Appointment on 08/15/2023  Component Date Value Ref Range Status  . WBC Count 08/15/2023 8.3  4.0 - 10.5 K/uL Final  . RBC 08/15/2023 5.12  4.22 - 5.81 MIL/uL Final  . Hemoglobin 08/15/2023 14.7  13.0 - 17.0 g/dL Final  . HCT 76/28/3151 44.7  39.0 - 52.0 % Final  . MCV 08/15/2023 87.3  80.0 - 100.0 fL Final  . MCH 08/15/2023 28.7  26.0 - 34.0 pg Final  . MCHC 08/15/2023 32.9  30.0 - 36.0 g/dL Final  . RDW 76/16/0737 13.9  11.5 - 15.5 % Final  . Platelet Count 08/15/2023 342  150 - 400 K/uL Final  . nRBC 08/15/2023 0.0  0.0 - 0.2 % Final  . Neutrophils Relative % 08/15/2023 48  % Final  . Neutro Abs 08/15/2023 4.1  1.7 - 7.7 K/uL Final  . Lymphocytes Relative 08/15/2023 35  % Final  . Lymphs Abs 08/15/2023 2.9  0.7 - 4.0 K/uL Final  . Monocytes Relative 08/15/2023 11  % Final  . Monocytes Absolute 08/15/2023 0.9  0.1 - 1.0 K/uL Final  . Eosinophils Relative 08/15/2023 4  % Final  . Eosinophils Absolute 08/15/2023 0.3  0.0 - 0.5 K/uL Final  . Basophils Relative 08/15/2023 1  % Final  . Basophils Absolute 08/15/2023 0.0  0.0 - 0.1 K/uL Final  . Immature Granulocytes 08/15/2023 1  % Final  . Abs Immature Granulocytes 08/15/2023  0.04  0.00 - 0.07 K/uL Final   Performed at [facility], [address] [place], [place] [address]  . Sodium 08/15/2023 133 (L)  135 - 145 mmol/L Final  . Potassium 08/15/2023 4.1  3.5 - 5.1 mmol/L Final  . Chloride 08/15/2023 99  98 - 111 mmol/L Final  . CO2 08/15/2023 25  22 - 32 mmol/L Final  . Glucose, Bld 08/15/2023 178 (H)  70 - 99 mg/dL Final   Glucose reference range applies only to samples taken after fasting for at least 8 hours.  . BUN 08/15/2023 13  8 - 23 mg/dL Final  .  Creatinine 08/15/2023 0.72  0.61 - 1.24 mg/dL Final  . Calcium 01/75/1025 8.6 (L)  8.9 - 10.3 mg/dL Final  . Total Protein 08/15/2023 6.6  6.5 - 8.1 g/dL Final  . Albumin 85/27/7824 3.5  3.5 - 5.0 g/dL Final  . AST 23/53/6144 16  15 - 41 U/L Final  . ALT 08/15/2023 15  0 - 44 U/L Final  . Alkaline Phosphatase 08/15/2023 79  38 - 126 U/L Final  . Total Bilirubin 08/15/2023 0.6  0.3 - 1.2 mg/dL Final  . GFR, Estimated 08/15/2023 >60  >60 mL/min Final   Comment: (NOTE) Calculated using the CKD-EPI Creatinine Equation (2021)   . Anion gap 08/15/2023 9  5 - 15 Final   Performed at [facility], [address] [place], [place] [address]  . Cortisol, Plasma 08/15/2023 8.3  ug/dL Final   Comment: (NOTE) AM    6.7 - 22.6 ug/dL PM   <08.6       ug/dL Performed at [facility], [address] [place], [place] [address]   . Free T4 08/15/2023 0.60 (L)  0.61 - 1.12 ng/dL Final   Comment: (NOTE) Biotin ingestion may interfere with free T4 tests. If the results are inconsistent with the TSH level, previous test results, or the clinical presentation, then consider biotin interference. If needed, order repeat testing after stopping biotin. Performed at [facility], [address] [place], [place] [address]   . TSH 08/15/2023 4.596 (H)  0.350 - 4.500 uIU/mL Final   Comment: Performed by a 3rd Generation assay with a functional sensitivity of <=0.01  uIU/mL. Performed at [facility], [address] [place], [place] [address]     RADIOGRAPHIC STUDIES: I have personally reviewed the radiological images as listed and agree with the findings in the report  No results found.  ASSESSMENT/PLAN 77 y.o. male is here because of  malignant melanoma.  Medical history notable for diabetes mellitus, coronary artery disease, cerebrovascular disease, MI, cataracts  Malignant melanoma, distal left leg, Stage IIC (T4b N0 M0)- Risk factors for this patient are 1) chronic exposure to UV 2) Sunburns 3) Fitzpatrick Grade 2 skin tone March 19, 2023: Shave biopsy left lateral leg demonstrated malignant melanoma, nodular type. Breslow depth 5.1 mm. Clark's level 4. Ulceration present. Mitotic index 16/mm. Pathologic stage T4b. Deep margin involved  April 01 2023- Wide local excision.  Sentinel LN bx not performed due to patient frailty  Therapeutics   Apr 03 2023- Will obtain CBC with diff, CMP, LDH, MRI brain and whole body PET/CT for staging  Patient meets criteria per NCCN Guidelines for adjuvant immunotherapy and will plan on adjuvant Pembrolizumab  May 06 2023- Chemotherapy teaching  May 13 2023:  Cycle 1 Pembrolizumab  June 03 2023:  Cycle 2 Pembrolizumab  June 24 2023:  TSH 0.31 Free T4 11.3-- Will follow as this may be due to thyroiditis from Pembrolizumab.   Cycle 3 Pembrolizumab  July 31 2923- Pembrolizumab currently on hold due to recent bout of mental status changes. Rechecked thyroid studies- TSH 0.81 F4 1.11 Thyroglobulin Ab negative.  Stopped  methimidazole as he does not have thyrotoxicosis.  August 15 2023- Appears to be back at baseline.  Will resume Keytruda.  Rechecking thyroid studies August 18 2023:  Cycle 4 Keytruda   Immunotherapy Risks:   May 06 2023- Reiterated potential side effects of immunotherapy with the patient.  These include but are not limited to:  Fatigue, Hair  loss, low blood counts  (anemia, thrombocytopenia) that may necessitate transfusion, bleeding, infection, nausea/ vomiting/Appetite changes/ Constipation/ Diarrhea, mucositis, Neuropathy/ neurologic problems, skin and nail changes such as dry skin and color change, Urine and bladder changes and kidney problems, weight changes, mood changes, decreased libido/fertility problems, damage to heart and lungs.  Some of these side effects can be life threatening, may be permanent and can result in hospitalization and/or death.  In shared decision making patient has agreed to proceed with immunotherapy.    Poor IV access  Apr 29 2023- Underwent portacath placement  Urinary frequency  May 06 2023- Likely secondary to BPH.  PSA 7.11 and began Proscar.    June 24 2023- Urinary frequency improved.  May be due to BPH with contribution of hyperglycemia  August 15 2023- Has not yet seen urology.  Will request again.    Elevated PSA  May 06 2023- PSA 7.11  June 24 2023- Referred to urology for evaluation       Cancer Staging  Malignant melanoma [facility]) Staging form: Melanoma of the Skin, AJCC 8th Edition - Clinical stage from 04/03/2023: Stage IIC (cT4b, cN0, cM0) - Signed by Loni Muse, MD on 04/03/2023 Histopathologic type: Nodular melanoma Stage prefix: Initial diagnosis    No problem-specific Assessment & Plan notes found for this encounter.    No orders of the defined types were placed in this encounter.   30 minutes was spent in patient care.  This included time spent preparing to see the patient (e.g., review of tests), obtaining and/or reviewing separately obtained history, counseling and educating the patient/family/caregiver, ordering medications, tests, or procedures; documenting clinical information in the electronic or other health record, independently interpreting results and communicating results to the patient/family/caregiver as well as coordination of care.       All questions were answered. The  patient knows to call the clinic with any problems, questions or concerns.  This note was electronically signed.    Loni Muse, MD  09/02/2023 10:09 AM

## 2023-09-03 ENCOUNTER — Inpatient Hospital Stay: Payer: Medicare HMO | Admitting: Oncology

## 2023-09-03 ENCOUNTER — Inpatient Hospital Stay: Payer: Medicare HMO

## 2023-09-04 ENCOUNTER — Other Ambulatory Visit: Payer: Self-pay

## 2023-09-08 ENCOUNTER — Ambulatory Visit: Payer: Medicare HMO

## 2023-09-08 ENCOUNTER — Telehealth: Payer: Self-pay

## 2023-09-08 ENCOUNTER — Inpatient Hospital Stay: Payer: Medicare HMO

## 2023-09-08 ENCOUNTER — Inpatient Hospital Stay: Payer: Medicare HMO | Attending: Oncology | Admitting: Oncology

## 2023-09-08 VITALS — BP 105/68 | HR 88 | Temp 97.9°F | Resp 16 | Ht 69.0 in | Wt 180.2 lb

## 2023-09-08 DIAGNOSIS — Z79899 Other long term (current) drug therapy: Secondary | ICD-10-CM | POA: Diagnosis not present

## 2023-09-08 DIAGNOSIS — Z5111 Encounter for antineoplastic chemotherapy: Secondary | ICD-10-CM | POA: Insufficient documentation

## 2023-09-08 DIAGNOSIS — E039 Hypothyroidism, unspecified: Secondary | ICD-10-CM | POA: Insufficient documentation

## 2023-09-08 DIAGNOSIS — Z09 Encounter for follow-up examination after completed treatment for conditions other than malignant neoplasm: Secondary | ICD-10-CM

## 2023-09-08 DIAGNOSIS — R296 Repeated falls: Secondary | ICD-10-CM | POA: Insufficient documentation

## 2023-09-08 DIAGNOSIS — E064 Drug-induced thyroiditis: Secondary | ICD-10-CM | POA: Diagnosis not present

## 2023-09-08 DIAGNOSIS — C4372 Malignant melanoma of left lower limb, including hip: Secondary | ICD-10-CM | POA: Diagnosis not present

## 2023-09-08 DIAGNOSIS — R54 Age-related physical debility: Secondary | ICD-10-CM

## 2023-09-08 DIAGNOSIS — E032 Hypothyroidism due to medicaments and other exogenous substances: Secondary | ICD-10-CM | POA: Insufficient documentation

## 2023-09-08 LAB — CMP (CANCER CENTER ONLY)
ALT: 16 U/L (ref 0–44)
AST: 19 U/L (ref 15–41)
Albumin: 3.7 g/dL (ref 3.5–5.0)
Alkaline Phosphatase: 85 U/L (ref 38–126)
Anion gap: 10 (ref 5–15)
BUN: 19 mg/dL (ref 8–23)
CO2: 22 mmol/L (ref 22–32)
Calcium: 8.7 mg/dL — ABNORMAL LOW (ref 8.9–10.3)
Chloride: 103 mmol/L (ref 98–111)
Creatinine: 0.67 mg/dL (ref 0.61–1.24)
GFR, Estimated: >60 mL/min (ref >60)
Glucose, Bld: 218 mg/dL — ABNORMAL HIGH (ref 70–99)
Potassium: 4.1 mmol/L (ref 3.5–5.1)
Sodium: 135 mmol/L (ref 135–145)
Total Bilirubin: 1.1 mg/dL (ref 0.3–1.2)
Total Protein: 7 g/dL (ref 6.5–8.1)

## 2023-09-08 LAB — CBC WITH DIFFERENTIAL (CANCER CENTER ONLY)
Abs Immature Granulocytes: 0.09 10*3/uL — ABNORMAL HIGH (ref 0.00–0.07)
Basophils Absolute: 0.1 10*3/uL (ref 0.0–0.1)
Basophils Relative: 0 %
Eosinophils Absolute: 0.1 10*3/uL (ref 0.0–0.5)
Eosinophils Relative: 1 %
HCT: 44.2 % (ref 39.0–52.0)
Hemoglobin: 15 g/dL (ref 13.0–17.0)
Immature Granulocytes: 1 %
Lymphocytes Relative: 17 %
Lymphs Abs: 2.3 10*3/uL (ref 0.7–4.0)
MCH: 29.5 pg (ref 26.0–34.0)
MCHC: 33.9 g/dL (ref 30.0–36.0)
MCV: 86.8 fL (ref 80.0–100.0)
Monocytes Absolute: 0.9 10*3/uL (ref 0.1–1.0)
Monocytes Relative: 7 %
Neutro Abs: 9.9 10*3/uL — ABNORMAL HIGH (ref 1.7–7.7)
Neutrophils Relative %: 74 %
Platelet Count: 332 10*3/uL (ref 150–400)
RBC: 5.09 MIL/uL (ref 4.22–5.81)
RDW: 14.4 % (ref 11.5–15.5)
WBC Count: 13.3 10*3/uL — ABNORMAL HIGH (ref 4.0–10.5)
nRBC: 0 % (ref 0.0–0.2)

## 2023-09-08 LAB — TSH: TSH: 14.919 u[IU]/mL — ABNORMAL HIGH (ref 0.350–4.500)

## 2023-09-08 LAB — T4, FREE: Free T4: 0.6 ng/dL — ABNORMAL LOW (ref 0.61–1.12)

## 2023-09-08 MED ORDER — LEVOTHYROXINE SODIUM 50 MCG PO TABS: 50.0000 ug | ORAL_TABLET | Freq: Every day | ORAL | 1 refills | Status: DC

## 2023-09-08 NOTE — Telephone Encounter (Signed)
-----   Message from Loni Muse sent at 09/08/2023  3:08 PM EDT ----- Please let patient know that I sent a script into [facility] pharmacy for synthroid.  Please ask him to pick it up ASAP Thanks

## 2023-09-08 NOTE — Progress Notes (Signed)
[facility] Cancer Follow up Visit:  Patient Care Team: Jon Nelson, Jon Nelson as PCP - General (Family Medicine) Jon Lea, MD as PCP - Cardiology (Cardiology) Jon Nelson, Jon Castle, MD as Consulting Physician (Optometry) Jon Shearer, MD as Referring Physician (Dermatology) Jon Muse, MD as Consulting Physician (Internal Medicine)  CHIEF COMPLAINTS/PURPOSE OF CONSULTATION:  Oncology History  Malignant melanoma (HCC)  04/03/2023 Initial Diagnosis   Malignant melanoma (HCC)   04/03/2023 Cancer Staging   Staging form: Melanoma of the Skin, AJCC 8th Edition - Clinical stage from 04/03/2023: Stage IIC (cT4b, cN0, cM0) - Signed by Jon Muse, MD on 04/03/2023 Histopathologic type: Nodular melanoma Stage prefix: Initial diagnosis   05/13/2023 -  Chemotherapy   Patient is on Treatment Plan : MELANOMA Pembrolizumab (200) q21d       HISTORY OF PRESENTING ILLNESS: Jon Nelson 77 y.o. 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  Breslow depth 5.1 mm.  Clark's level 4.  Ulceration present.  Mitotic index 16/mm.  Pathologic stage T4b.  Deep margin involved  Apr 03 2023:  [facility] Medical Oncology Consult Patient here with sister in law Ms Jon Nelson who is also his power of attorney.  He has had a mole on the left lateral leg for years but in the last year it degenerated into a sore which didn't heal which prompted him to see a dermatologist.  He has been followed by dermatology for removal of non-melanomatous skin cancers on his face and arms over the years.  He underwent a shave biopsy of the left leg lesion on March 19 2023.  He has not been noted to have in transit lesions.  April 01 2023 he underwent a wide local excision of the region but did not undergo  inguinal lymphadenectomy.     Social:  Widowed.  Lives with his dog.  Worked for [employer] where he worked outside.  No smoking.  EtOH none  [facility] Mother died 37 dementia Father estranged No siblings.     Apr 12 2023:  MRI brain No evidence of metastatic disease. 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  Apr 14 2023:  Whole body PET/CT Postsurgical changes in the lateral left lower leg, as above.  No findings suspicious for metastatic disease.   Apr 29 2023:   Has undergone port placement since last visit.  Has not yet been scheduled for teaching or therapy.  Discussed risks and benefits of immunotherapy    May 06 2023:   Experiencing constipation.  Has urinary frequency but not interested in seeing a urologist.  To undergo chemotherapy teaching today.  Will check PSA  May 13 2023:  Cycle 1 Pembrolizumab  June 03 2023:  Cycle 2 Pembrolizumab  June 24 2023:    LE edema improved.  Does not check FSBG's.  Sister could not come to visit today because she struck a deer while driving.    TSH 0.31 Free T4 11.3 CMP notable for glucose 276  Cycle 3 Pembrolizumab  June 30 2023:  Admitted to  health with mental status changes.   CT head without contrast   Generalized cerebral atrophy with chronic white matter small vessel ischemic changes.  No acute intracranial abnormality.  Carotid dopplers-   Right- moderate heterogeneous and calcified plaque,  with  no hemodynamically significant stenosis by duplex criteria in  the extracranial cerebrovascular circulation.   Left- Heterogeneous and partially calcified plaque at the left carotid bifurcation, with discordant results regarding degree of stenosis by  established duplex criteria. Peak velocity suggests 50%-69% stenosis, with the ICA/ CCA ratio suggesting a lesser degree of stenosis   CT neck angiography- No emergent vascular finding.  Widespread atherosclerosis with up to 50% left ICA bulb and  left V4 segment stenoses. Moderate heterogeneity of plaque at the left  ICA bulb  MRI head- No evidence of acute intracranial abnormality. Remote left posterior frontal cortical infarct and moderate chronic microvascular ischemic change.  Cardiac ECHO- LVEF 55-60%.  Aortic valve mildly sclerotic without stenosis.  Trivial MR.   Labs notable for Sodium 133 Glucose 150.  Urine specific gravity 1.023  TSH < 0.02 FT4 3.42 (ULN 3.42)  FT3 6.47 (ULN 5.27)  Patient was placed on methimidazole for hyperthyroidism  Patient was seen by neurology.   July 15 2023:  Scheduled follow up for melanoma   July 31 2023:    States that he feels great but does stay sleepy for awhile.  Patient can't provide much information as to what happened on the day of admission to the hospital.  Here alone today.  Holding treatment pending results of lab testing  August 15 2023:   Feels well.  Worked in yard yesterday.  Has not made it to urologist.  Had nocturia x 5 last night TSH 4.596 FT4 0.60  August 18 2023:  Cycle 4 Keytruda  September 08 2023:  Scheduled follow up for melanoma.  This morning tripped and fell face first while at [facility].  Struck his face on the ground and required assistance to get up.  No LOC, HA, or neck pain.    September 10 2023:  Cycle 5 Keytruda   Review of Systems  Constitutional:  Negative for appetite change, chills, fatigue, fever and unexpected weight change.  HENT:   Positive for hearing loss. Negative for lump/mass, mouth sores, nosebleeds, sore throat and trouble swallowing.        Deaf left ear  Eyes:  Positive for eye problems. Negative for icterus.       Vision changes:  None  Respiratory:  Negative for chest tightness, cough, hemoptysis and wheezing.        DOE on walking up hill  Cardiovascular:  Positive for leg swelling. Negative for chest pain and palpitations.       PND:  none Orthopnea:  none  Gastrointestinal:  Negative for abdominal distention, abdominal  pain, blood in stool, constipation, diarrhea, nausea and vomiting.  Endocrine: Negative for hot flashes.       Cold intolerance:  none Heat intolerance:  none  Genitourinary:  Positive for frequency. Negative for bladder incontinence, difficulty urinating, dysuria and hematuria.        Nocturia x 3 to 4  Musculoskeletal:  Negative for arthralgias, back pain, gait problem, myalgias, neck pain and neck stiffness.  Skin:  Negative for rash.       Chronic pruritus of arms.  Healing from surgery to left leg  Neurological:  Negative for extremity weakness, gait problem, headaches, numbness and speech difficulty.       Occasional dizziness particularly when bending over.  No falls   Hematological:  Negative for adenopathy. Does not bruise/bleed easily.  Psychiatric/Behavioral:  Negative for sleep disturbance and suicidal ideas. The patient is not nervous/anxious.     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)    Old myocardial infarct    Other amnesia    TIA (transient ischemic attack)     SURGICAL HISTORY: Past Surgical History:  Procedure Laterality Date   CATARACT EXTRACTION Left 08/15/2020   CORONARY ARTERY BYPASS GRAFT N/A 09/24/2021   Procedure: CORONARY ARTERY BYPASS GRAFTING (CABG) TIMES THREE , ON PUMP, USING LEFT INTERNAL MAMMARY ARTERY AND ENDOSCOPICALLY HARVESTED RIGHT GREATER SAPHENOUS VEIN;  Surgeon: Corliss Skains, MD;  Location: [facility] OR;  Service: Open Heart Surgery;  Laterality: N/A;   ENDOVEIN HARVEST OF GREATER SAPHENOUS VEIN Right 09/24/2021   Procedure: ENDOVEIN HARVEST OF GREATER SAPHENOUS VEIN;  Surgeon: Corliss Skains, MD;  Location: [facility]  OR;  Service: Open Heart Surgery;  Laterality: Right;   HEMORRHOID SURGERY     HERNIA REPAIR  October 15, 2020   LEFT HEART CATH AND CORONARY ANGIOGRAPHY N/A 09/18/2021   Procedure: LEFT HEART CATH AND CORONARY ANGIOGRAPHY;  Surgeon: Lyn Records, MD;  Location: [facility] INVASIVE CV LAB;  Service: Cardiovascular;  Laterality: N/A;   LOOP RECORDER INSERTION N/A 02/03/2019   Procedure: LOOP RECORDER INSERTION;  Surgeon: Regan Lemming, MD;  Location: [facility] INVASIVE CV LAB;  Service: Cardiovascular;  Laterality: N/A;   MOLE REMOVAL     PORTA CATH INSERTION     SKIN CANCER EXCISION     TEE WITHOUT CARDIOVERSION N/A 02/03/2019   Procedure: TRANSESOPHAGEAL ECHOCARDIOGRAM (TEE);  Surgeon: Jake Bathe, MD;  Location: [facility] ENDOSCOPY;  Service: Cardiovascular;  Laterality: N/A;  loop   TEE WITHOUT CARDIOVERSION N/A 09/24/2021   Procedure: TRANSESOPHAGEAL ECHOCARDIOGRAM (TEE);  Surgeon: Corliss Skains, MD;  Location: [facility] OR;  Service: Open Heart Surgery;  Laterality: N/A;    SOCIAL HISTORY: Social History   Socioeconomic History   Marital status: Widowed    Spouse name: Not on file   Number of children: 0   Years of education: Not on file   Highest education level: Not on file  Occupational History   Occupation: Retired Brewing technologist)  Tobacco Use   Smoking status: Never   Smokeless tobacco: Never  Vaping Use   Vaping status: Never Used  Substance and Sexual Activity   Alcohol use: Never   Drug use: Never   Sexual activity: Not Currently  Other Topics Concern   Not on file  Social History Narrative   Patient's wife passed away in Oct 16, 2015 due to cancer - she was unable to have children.  Patient has a sister, his niece and her family lives in his home temporarily    Social Determinants of Health   Financial Resource Strain: Low Risk  (01/16/2022)   Overall Financial Resource Strain (CARDIA)    Difficulty of Paying Living Expenses: Not hard at all  Food Insecurity: No Food Insecurity (04/03/2023)    Hunger Vital Sign    Worried About Running Out of Food in the Last Year: Never true    Ran Out of Food in the Last Year: Never true  Transportation Needs: No Transportation Needs (01/16/2022)   PRAPARE - Administrator, Civil Service (Medical): No    Lack of Transportation (Non-Medical): No  Physical Activity:  Not on file  Stress: Not on file  Social Connections: Not on file  Intimate Partner Violence: Not At Risk (04/03/2023)   Humiliation, Afraid, Rape, and Kick questionnaire    Fear of Current or Ex-Partner: No    Emotionally Abused: No    Physically Abused: No    Sexually Abused: No    FAMILY HISTORY Family History  Problem Relation Age of Onset   Stroke Mother    Alzheimer's disease Mother     ALLERGIES:  is allergic to aricept [donepezil].  MEDICATIONS:  Current Outpatient Medications  Medication Sig Dispense Refill   acetaminophen (TYLENOL) 500 MG tablet Take 500 mg by mouth every 6 (six) hours as needed for mild pain or moderate pain. (Patient not taking: Reported on 06/24/2023)     aspirin EC 81 MG EC tablet Take 1 tablet (81 mg total) by mouth daily. Swallow whole. (Patient not taking: Reported on 06/24/2023)     clopidogrel (PLAVIX) 75 MG tablet Take 1 tablet (75 mg total) by mouth daily. 30 tablet 1   finasteride (PROSCAR) 5 MG tablet Take 1 tablet (5 mg total) by mouth daily. 30 tablet 5   meloxicam (MOBIC) 15 MG tablet Take 15 mg by mouth daily.     metFORMIN (GLUCOPHAGE) 1000 MG tablet Take 1,000 mg by mouth 2 (two) times daily.     metoprolol tartrate (LOPRESSOR) 25 MG tablet Take 0.5 tablets (12.5 mg total) by mouth 2 (two) times daily. 30 tablet 1   ondansetron (ZOFRAN) 8 MG tablet Take 1 tablet (8 mg total) by mouth every 8 (eight) hours as needed for nausea or vomiting. (Patient not taking: Reported on 06/24/2023) 30 tablet 1   prochlorperazine (COMPAZINE) 10 MG tablet Take 1 tablet (10 mg total) by mouth every 6 (six) hours as needed for nausea or  vomiting. (Patient not taking: Reported on 06/24/2023) 30 tablet 1   rosuvastatin (CRESTOR) 20 MG tablet Take 20 mg by mouth daily.     traMADol (ULTRAM) 50 MG tablet Take 50 mg by mouth every 6 (six) hours as needed. (Patient not taking: Reported on 06/24/2023)     No current facility-administered medications for this visit.    PHYSICAL EXAMINATION:  ECOG PERFORMANCE STATUS: 1 - Symptomatic but completely ambulatory   There were no vitals filed for this visit.    There were no vitals filed for this visit.     Physical Exam Vitals and nursing note reviewed.  Constitutional:      Appearance: Normal appearance. He is normal weight. He is not diaphoretic.     Comments: Here alone  HENT:     Head: Normocephalic and atraumatic.      Comments: 0 = area of injury on face    Right Ear: External ear normal.     Left Ear: External ear normal.     Nose: Nose normal.  Eyes:     Conjunctiva/sclera: Conjunctivae normal.     Pupils: Pupils are equal, round, and reactive to light.  Neck:     Comments: No neck pain on exam.  Normal ROM Cardiovascular:     Rate and Rhythm: Normal rate and regular rhythm.     Heart sounds:     No friction rub. No gallop.  Pulmonary:     Effort: Pulmonary effort is normal. No respiratory distress.     Breath sounds: Normal breath sounds. No stridor.  Abdominal:     General: Abdomen is flat. There is no distension.  Palpations: Abdomen is soft.     Tenderness: There is no abdominal tenderness. There is no guarding.  Musculoskeletal:        General: No swelling. Normal range of motion.     Cervical back: Normal range of motion and neck supple. No rigidity or tenderness.     Right lower leg: No edema.     Left lower leg: No edema.  Lymphadenopathy:     Head:     Right side of head: No submental, submandibular, tonsillar, preauricular, posterior auricular or occipital adenopathy.     Left side of head: No submental, submandibular, tonsillar,  preauricular, posterior auricular or occipital adenopathy.     Cervical: No cervical adenopathy.     Right cervical: No superficial, deep or posterior cervical adenopathy.    Left cervical: No superficial, deep or posterior cervical adenopathy.     Upper Body:     Right upper body: No supraclavicular or axillary adenopathy.     Left upper body: No supraclavicular or axillary adenopathy.     Lower Body: No right inguinal adenopathy. No left inguinal adenopathy.  Skin:    Coloration: Skin is not jaundiced.     Findings: No bruising or erythema.     Comments: Fitzpatrick 2 skin tone.  Significant solar damage to face and arms.   Left leg bandaged.    Neurological:     General: No focal deficit present.     Mental Status: He is alert and oriented to person, place, and time.     Comments: Hard of hearing  Psychiatric:        Mood and Affect: Mood normal.        Behavior: Behavior normal.        Thought Content: Thought content normal.        Judgment: Judgment normal.     LABORATORY DATA: I have personally reviewed the data as listed:  Appointment on 08/15/2023  Component Date Value Ref Range Status   WBC Count 08/15/2023 8.3  4.0 - 10.5 K/uL Final   RBC 08/15/2023 5.12  4.22 - 5.81 MIL/uL Final   Hemoglobin 08/15/2023 14.7  13.0 - 17.0 g/dL Final   HCT 08/65/7846 44.7  39.0 - 52.0 % Final   MCV 08/15/2023 87.3  80.0 - 100.0 fL Final   MCH 08/15/2023 28.7  26.0 - 34.0 pg Final   MCHC 08/15/2023 32.9  30.0 - 36.0 g/dL Final   RDW 96/29/5284 13.9  11.5 - 15.5 % Final   Platelet Count 08/15/2023 342  150 - 400 K/uL Final   nRBC 08/15/2023 0.0  0.0 - 0.2 % Final   Neutrophils Relative % 08/15/2023 48  % Final   Neutro Abs 08/15/2023 4.1  1.7 - 7.7 K/uL Final   Lymphocytes Relative 08/15/2023 35  % Final   Lymphs Abs 08/15/2023 2.9  0.7 - 4.0 K/uL Final   Monocytes Relative 08/15/2023 11  % Final   Monocytes Absolute 08/15/2023 0.9  0.1 - 1.0 K/uL Final   Eosinophils Relative  08/15/2023 4  % Final   Eosinophils Absolute 08/15/2023 0.3  0.0 - 0.5 K/uL Final   Basophils Relative 08/15/2023 1  % Final   Basophils Absolute 08/15/2023 0.0  0.0 - 0.1 K/uL Final   Immature Granulocytes 08/15/2023 1  % Final   Abs Immature Granulocytes 08/15/2023 0.04  0.00 - 0.07 K/uL Final   Performed at [facility], [address], [place], [place] [address]   Sodium 08/15/2023 133 (  L)  135 - 145 mmol/L Final   Potassium 08/15/2023 4.1  3.5 - 5.1 mmol/L Final   Chloride 08/15/2023 99  98 - 111 mmol/L Final   CO2 08/15/2023 25  22 - 32 mmol/L Final   Glucose, Bld 08/15/2023 178 (H)  70 - 99 mg/dL Final   Glucose reference range applies only to samples taken after fasting for at least 8 hours.   BUN 08/15/2023 13  8 - 23 mg/dL Final   Creatinine 40/98/1191 0.72  0.61 - 1.24 mg/dL Final   Calcium 47/82/9562 8.6 (L)  8.9 - 10.3 mg/dL Final   Total Protein 13/07/6577 6.6  6.5 - 8.1 g/dL Final   Albumin 46/96/2952 3.5  3.5 - 5.0 g/dL Final   AST 84/13/2440 16  15 - 41 U/L Final   ALT 08/15/2023 15  0 - 44 U/L Final   Alkaline Phosphatase 08/15/2023 79  38 - 126 U/L Final   Total Bilirubin 08/15/2023 0.6  0.3 - 1.2 mg/dL Final   GFR, Estimated 08/15/2023 >60  >60 mL/min Final   Comment: (NOTE) Calculated using the CKD-EPI Creatinine Equation (2021)    Anion gap 08/15/2023 9  5 - 15 Final   Performed at [facility], [address] [place], [place] [address]   Cortisol, Plasma 08/15/2023 8.3  ug/dL Final   Comment: (NOTE) AM    6.7 - 22.6 ug/dL PM   <53.6       ug/dL Performed at [facility], [address] [place], [place] [address]    Free T4 08/15/2023 0.60 (L)  0.61 - 1.12 ng/dL Final   Comment: (NOTE) Biotin ingestion may interfere with free T4 tests. If the results are inconsistent with the TSH level, previous test results, or the clinical presentation, then consider biotin interference. If needed, order repeat testing after  stopping biotin. Performed at [facility], [address], [place], [place] [address]    TSH 08/15/2023 4.596 (H)  0.350 - 4.500 uIU/mL Final   Comment: Performed by a 3rd Generation assay with a functional sensitivity of <=0.01 uIU/mL. Performed at [facility], [address], [place], [place] [address]     RADIOGRAPHIC STUDIES: I have personally reviewed the radiological images as listed and agree with the findings in the report  No results found.  ASSESSMENT/PLAN 77 y.o. male is here because of  malignant melanoma.  Medical history notable for diabetes mellitus, coronary artery disease, cerebrovascular disease, MI, cataracts  Malignant melanoma, distal left leg, Stage IIC (T4b N0 M0)- Risk factors for this patient are 1) chronic exposure to UV 2) Sunburns 3) Fitzpatrick Grade 2 skin tone March 19, 2023: Shave biopsy left lateral leg demonstrated malignant melanoma, nodular type. Breslow depth 5.1 mm. Clark's level 4. Ulceration present. Mitotic index 16/mm. Pathologic stage T4b. Deep margin involved  April 01 2023- Wide local excision.  Sentinel LN bx not performed due to patient frailty  Therapeutics   Apr 03 2023- Will obtain CBC with diff, CMP, LDH, MRI brain and whole body PET/CT for staging  Patient meets criteria per NCCN Guidelines for adjuvant immunotherapy and will plan on adjuvant Pembrolizumab  May 06 2023- Chemotherapy teaching  May 13 2023:  Cycle 1 Pembrolizumab  June 03 2023:  Cycle 2 Pembrolizumab  June 24 2023:  TSH 0.31 Free T4 11.3-- Will follow as this may be due to thyroiditis from Pembrolizumab.   Cycle 3 Pembrolizumab  July 31 2923- Pembrolizumab currently on  hold due to recent bout of mental status changes. Rechecked thyroid studies- TSH 0.81 F4 1.11 Thyroglobulin Ab negative.  Stopped  methimidazole as he does not have thyrotoxicosis.  August 15 2023- Appears to be back at baseline.  Resume Keytruda. TSH 4.596 FT4 0.60    August 18 2023:  Cycle 4 Keytruda September 08 2023-  TSH 14.92.  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  These include but are not limited to:  Fatigue, Hair loss, low blood counts (anemia, thrombocytopenia) that may necessitate transfusion, bleeding, infection, nausea/ vomiting/Appetite changes/ Constipation/ Diarrhea, mucositis, Neuropathy/ neurologic problems, skin and nail changes such as dry skin and color change, Urine and bladder changes and kidney problems, weight changes, mood changes, decreased libido/fertility problems, damage to heart and lungs.  Some of these side effects can be life threatening, may be permanent and can result in hospitalization and/or death.  In shared decision making patient has agreed to proceed with immunotherapy.    Poor IV access  Apr 29 2023- Underwent portacath placement  Urinary frequency  May 06 2023- Likely secondary to BPH.  PSA 7.11 and began Proscar.    June 24 2023- Urinary frequency improved.  May be due to BPH with contribution of hyperglycemia  August 15 2023- Has not yet seen urology.  Will request again.    Elevated PSA  May 06 2023- PSA 7.11  June 24 2023- Referred to urology for evaluation  Frequent falls  September 08 2023- Likely related to small vessel cerebrovascular disease, gait problems.  No evidence of serious injury from today's event.  Will follow as patient is frail elderly       Cancer Staging  Malignant melanoma (HCC) Staging form: Melanoma of the Skin, AJCC 8th Edition - Clinical stage from 04/03/2023: Stage IIC (cT4b, cN0, cM0) - Signed by Jon Muse, MD on 04/03/2023 Histopathologic type: Nodular melanoma Stage prefix: Initial diagnosis    No problem-specific Assessment & Plan notes found for this encounter.    No orders of  the defined types were placed in this encounter.   40 minutes was spent in patient care.  This included time spent preparing to see the patient (e.g., review of tests), obtaining and/or reviewing separately obtained history, counseling and educating the patient/family/caregiver, ordering medications, tests, or procedures; documenting clinical information in the electronic or other health record, independently interpreting results and communicating results to the patient/family/caregiver as well as coordination of care.       All questions were answered. The patient knows to call the clinic with any problems, questions or concerns.  This note was electronically signed.    Jon Muse, MD  09/08/2023 9:00 AM

## 2023-09-08 NOTE — Telephone Encounter (Signed)
Tried to contact patient, did not ring, voicemail not set up

## 2023-09-09 MED FILL — Pembrolizumab IV Soln 100 MG/4ML (25 MG/ML): INTRAVENOUS | Qty: 8 | Status: AC

## 2023-09-09 NOTE — Telephone Encounter (Signed)
Patient notified to pick up Thyroid medication.

## 2023-09-10 ENCOUNTER — Inpatient Hospital Stay: Payer: Medicare HMO

## 2023-09-10 ENCOUNTER — Other Ambulatory Visit: Payer: Self-pay

## 2023-09-10 VITALS — BP 160/77 | HR 72 | Temp 97.7°F | Resp 18 | Ht 69.0 in | Wt 181.1 lb

## 2023-09-10 DIAGNOSIS — C4372 Malignant melanoma of left lower limb, including hip: Secondary | ICD-10-CM

## 2023-09-10 DIAGNOSIS — Z5111 Encounter for antineoplastic chemotherapy: Secondary | ICD-10-CM | POA: Diagnosis not present

## 2023-09-10 DIAGNOSIS — E032 Hypothyroidism due to medicaments and other exogenous substances: Secondary | ICD-10-CM | POA: Diagnosis not present

## 2023-09-10 DIAGNOSIS — R296 Repeated falls: Secondary | ICD-10-CM | POA: Diagnosis not present

## 2023-09-10 DIAGNOSIS — Z79899 Other long term (current) drug therapy: Secondary | ICD-10-CM | POA: Diagnosis not present

## 2023-09-10 MED ORDER — SODIUM CHLORIDE 0.9 % IV SOLN: Freq: Once | INTRAVENOUS | Status: AC

## 2023-09-10 MED ORDER — HEPARIN SOD (PORK) LOCK FLUSH 100 UNIT/ML IV SOLN
500.0000 [IU] | Freq: Once | INTRAVENOUS | Status: AC | PRN
  Administered 2023-09-10: 500 [IU]

## 2023-09-10 MED ORDER — SODIUM CHLORIDE 0.9% FLUSH
10.0000 mL | INTRAVENOUS | Status: DC | PRN
  Administered 2023-09-10: 10 mL

## 2023-09-10 MED ORDER — SODIUM CHLORIDE 0.9 % IV SOLN
200.0000 mg | Freq: Once | INTRAVENOUS | Status: AC
  Administered 2023-09-10: 200 mg via INTRAVENOUS
  Filled 2023-09-10: qty 8

## 2023-09-10 MED ORDER — ALTEPLASE 2 MG IJ SOLR
2.0000 mg | Freq: Once | INTRAMUSCULAR | Status: AC | PRN
  Administered 2023-09-10: 2 mg
  Filled 2023-09-10: qty 2

## 2023-09-10 NOTE — Patient Instructions (Signed)

## 2023-09-10 NOTE — Progress Notes (Signed)
1010-dr ribakove notified via telephone that pt's port flushes very easily with slight blood tinged fluid returned but does have typical blood return.  Port has been alteplased with same result. Per dr Angelene Giovanni, it is ok to proceed with treatment and he would like dye study done.

## 2023-09-16 DIAGNOSIS — I2510 Atherosclerotic heart disease of native coronary artery without angina pectoris: Secondary | ICD-10-CM | POA: Diagnosis not present

## 2023-09-16 DIAGNOSIS — Z6827 Body mass index (BMI) 27.0-27.9, adult: Secondary | ICD-10-CM | POA: Diagnosis not present

## 2023-09-16 DIAGNOSIS — Z23 Encounter for immunization: Secondary | ICD-10-CM | POA: Diagnosis not present

## 2023-09-16 DIAGNOSIS — E782 Mixed hyperlipidemia: Secondary | ICD-10-CM | POA: Diagnosis not present

## 2023-09-16 DIAGNOSIS — I6990 Unspecified sequelae of unspecified cerebrovascular disease: Secondary | ICD-10-CM | POA: Diagnosis not present

## 2023-09-16 DIAGNOSIS — E1159 Type 2 diabetes mellitus with other circulatory complications: Secondary | ICD-10-CM | POA: Diagnosis not present

## 2023-09-16 DIAGNOSIS — I69331 Monoplegia of upper limb following cerebral infarction affecting right dominant side: Secondary | ICD-10-CM | POA: Diagnosis not present

## 2023-09-16 DIAGNOSIS — I6523 Occlusion and stenosis of bilateral carotid arteries: Secondary | ICD-10-CM | POA: Diagnosis not present

## 2023-09-16 DIAGNOSIS — I252 Old myocardial infarction: Secondary | ICD-10-CM | POA: Diagnosis not present

## 2023-09-22 DIAGNOSIS — R972 Elevated prostate specific antigen [PSA]: Secondary | ICD-10-CM | POA: Diagnosis not present

## 2023-09-22 DIAGNOSIS — N401 Enlarged prostate with lower urinary tract symptoms: Secondary | ICD-10-CM | POA: Diagnosis not present

## 2023-09-22 DIAGNOSIS — R351 Nocturia: Secondary | ICD-10-CM | POA: Diagnosis not present

## 2023-09-24 DIAGNOSIS — Z79899 Other long term (current) drug therapy: Secondary | ICD-10-CM | POA: Diagnosis not present

## 2023-09-24 DIAGNOSIS — R972 Elevated prostate specific antigen [PSA]: Secondary | ICD-10-CM | POA: Diagnosis not present

## 2023-09-24 DIAGNOSIS — R351 Nocturia: Secondary | ICD-10-CM | POA: Diagnosis not present

## 2023-09-25 ENCOUNTER — Other Ambulatory Visit: Payer: Medicare HMO

## 2023-09-25 ENCOUNTER — Ambulatory Visit: Payer: Medicare HMO | Admitting: Oncology

## 2023-09-29 ENCOUNTER — Inpatient Hospital Stay: Payer: Medicare HMO | Admitting: Oncology

## 2023-09-29 ENCOUNTER — Other Ambulatory Visit: Payer: Medicare HMO

## 2023-09-29 ENCOUNTER — Ambulatory Visit: Payer: Medicare HMO

## 2023-09-29 NOTE — Progress Notes (Deleted)
[facility] Cancer Follow up Visit:  Patient Care Team: Gus Height, [place] as PCP - General (Family Medicine) [name], MD as PCP - Cardiology (Cardiology) Jenene Slicker, Deberah Castle, MD as Consulting Physician (Optometry) Olegario Shearer, MD as Referring Physician (Dermatology) Loni Muse, MD as Consulting Physician (Internal Medicine)  CHIEF COMPLAINTS/PURPOSE OF CONSULTATION:  Oncology History  Malignant melanoma (HCC)  04/03/2023 Initial Diagnosis   Malignant melanoma (HCC)   04/03/2023 Cancer Staging   Staging form: Melanoma of the Skin, AJCC 8th Edition - Clinical stage from 04/03/2023: Stage IIC (cT4b, cN0, cM0) - Signed by Loni Muse, MD on 04/03/2023 Histopathologic type: Nodular melanoma Stage prefix: Initial diagnosis   05/13/2023 -  Chemotherapy   Patient is on Treatment Plan : MELANOMA Pembrolizumab (200) q21d       HISTORY OF PRESENTING ILLNESS: Jon Nelson 77 y.o. 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  Breslow depth 5.1 mm.  Clark's level 4.  Ulceration present.  Mitotic index 16/mm.  Pathologic stage T4b.  Deep margin involved  Apr 03 2023:  [name] Medical Oncology Consult Patient here with sister in law [name] who is also his power of attorney.  He has had a mole on the left lateral leg for years but in the last year it degenerated into a sore which didn't heal which prompted him to see a dermatologist.  He has been followed by dermatology for removal of non-melanomatous skin cancers on his face and arms over the years.  He underwent a shave biopsy of the left leg lesion on March 19 2023.  He has not been noted to have in transit lesions.  April 01 2023 he underwent a wide local excision of the region but did not undergo  inguinal lymphadenectomy.     Social:  Widowed.  Lives with his dog.  Worked for [employer] where he worked outside.  No smoking.  EtOH none  [facility] Mother died 8 dementia Father estranged No siblings.     Apr 12 2023:  MRI brain No evidence of metastatic disease. 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  Apr 14 2023:  Whole body PET/CT Postsurgical changes in the lateral left lower leg, as above.  No findings suspicious for metastatic disease.   Apr 29 2023:   Has undergone port placement since last visit.  Has not yet been scheduled for teaching or therapy.  Discussed risks and benefits of immunotherapy    May 06 2023:   Experiencing constipation.  Has urinary frequency but not interested in seeing a urologist.  To undergo chemotherapy teaching today.  Will check PSA  May 13 2023:  Cycle 1 Pembrolizumab  June 03 2023:  Cycle 2 Pembrolizumab  June 24 2023:    LE edema improved.  Does not check FSBG's.  Sister could not come to visit today because she struck a deer while driving.    TSH 0.31 Free T4 11.3 CMP notable for glucose 276  Cycle 3 Pembrolizumab  June 30 2023:  Admitted to [facility] with mental status changes.   CT head without contrast   Generalized cerebral atrophy with chronic white matter small vessel ischemic changes.  No acute intracranial abnormality.  Carotid dopplers-   Right- moderate heterogeneous and calcified plaque,  with  no hemodynamically significant stenosis by duplex criteria in  the extracranial cerebrovascular circulation.   Left- Heterogeneous and partially calcified plaque at the left carotid bifurcation, with discordant results regarding degree of stenosis by  established duplex criteria. Peak velocity suggests 50%-69% stenosis, with the ICA/ CCA ratio suggesting a lesser degree of stenosis   CT neck angiography- No emergent vascular finding.  Widespread atherosclerosis with up to 50% left ICA bulb and  left V4 segment stenoses. Moderate heterogeneity of plaque at the left  ICA bulb  MRI head- No evidence of acute intracranial abnormality. Remote left posterior frontal cortical infarct and moderate chronic microvascular ischemic change.  Cardiac ECHO- LVEF 55-60%.  Aortic valve mildly sclerotic without stenosis.  Trivial MR.   Labs notable for Sodium 133 Glucose 150.  Urine specific gravity 1.023  TSH < 0.02 FT4 3.42 (ULN 3.42)  FT3 6.47 (ULN 5.27)  Patient was placed on methimidazole for hyperthyroidism  Patient was seen by neurology.   July 15 2023:  Scheduled follow up for melanoma   July 31 2023:    States that he feels great but does stay sleepy for awhile.  Patient can't provide much information as to what happened on the day of admission to the hospital.  Here alone today.  Holding treatment pending results of lab testing  August 15 2023:   Feels well.  Worked in yard yesterday.  Has not made it to urologist.  Had nocturia x 5 last night TSH 4.596 FT4 0.60  August 18 2023:  Cycle 4 Keytruda  September 08 2023:  Scheduled follow up for melanoma.  This morning tripped and fell face first while at [facility].  Struck his face on the ground and required assistance to get up.  No LOC, HA, or neck pain.    September 10 2023:  Cycle 5 Keytruda   Review of Systems  Constitutional:  Negative for appetite change, chills, fatigue, fever and unexpected weight change.  HENT:   Positive for hearing loss. Negative for lump/mass, mouth sores, nosebleeds, sore throat and trouble swallowing.        Deaf left ear  Eyes:  Positive for eye problems. Negative for icterus.       Vision changes:  None  Respiratory:  Negative for chest tightness, cough, hemoptysis and wheezing.        DOE on walking up hill  Cardiovascular:  Positive for leg swelling. Negative for chest pain and palpitations.       PND:  none Orthopnea:  none  Gastrointestinal:  Negative for abdominal distention, abdominal  pain, blood in stool, constipation, diarrhea, nausea and vomiting.  Endocrine: Negative for hot flashes.       Cold intolerance:  none Heat intolerance:  none  Genitourinary:  Positive for frequency. Negative for bladder incontinence, difficulty urinating, dysuria and hematuria.        Nocturia x 3 to 4  Musculoskeletal:  Negative for arthralgias, back pain, gait problem, myalgias, neck pain and neck stiffness.  Skin:  Negative for rash.       Chronic pruritus of arms.  Healing from surgery to left leg  Neurological:  Negative for extremity weakness, gait problem, headaches, numbness and speech difficulty.       Occasional dizziness particularly when bending over.  No falls   Hematological:  Negative for adenopathy. Does not bruise/bleed easily.  Psychiatric/Behavioral:  Negative for sleep disturbance and suicidal ideas. The patient is not nervous/anxious.     MEDICAL HISTORY: Past Medical History:  Diagnosis Date  . Carotid artery stenosis   . Coronary artery stenosis   . Diabetes (HCC)   . Diabetes mellitus with cardiac complication (HCC)   . GI bleed   . HLD (hyperlipidemia)   . HTN (hypertension)   . Mixed hyperlipidemia   . 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 Old myocardial infarct   . Other amnesia   . TIA (transient ischemic attack)     SURGICAL HISTORY: Past Surgical History:  Procedure Laterality Date  . CATARACT EXTRACTION Left 08/15/2020  . CORONARY ARTERY BYPASS GRAFT N/A 09/24/2021   Procedure: CORONARY ARTERY BYPASS GRAFTING (CABG) TIMES THREE , ON PUMP, USING LEFT INTERNAL MAMMARY ARTERY AND ENDOSCOPICALLY HARVESTED RIGHT GREATER SAPHENOUS VEIN;  Surgeon: Corliss Skains, MD;  Location: [facility] OR;  Service: Open Heart Surgery;  Laterality: N/A;  . ENDOVEIN HARVEST OF GREATER SAPHENOUS VEIN Right 09/24/2021   Procedure: ENDOVEIN HARVEST OF GREATER SAPHENOUS VEIN;  Surgeon: Corliss Skains, MD;   Location: [facility] OR;  Service: Open Heart Surgery;  Laterality: Right;  . HEMORRHOID SURGERY    . HERNIA REPAIR  October 26, 2020  . LEFT HEART CATH AND CORONARY ANGIOGRAPHY N/A 09/18/2021   Procedure: LEFT HEART CATH AND CORONARY ANGIOGRAPHY;  Surgeon: Lyn Records, MD;  Location: [facility] INVASIVE CV LAB;  Service: Cardiovascular;  Laterality: N/A;  . LOOP RECORDER INSERTION N/A 02/03/2019   Procedure: LOOP RECORDER INSERTION;  Surgeon: Regan Lemming, MD;  Location: [facility] INVASIVE CV LAB;  Service: Cardiovascular;  Laterality: N/A;  . MOLE REMOVAL    . PORTA CATH INSERTION    . SKIN CANCER EXCISION    . TEE WITHOUT CARDIOVERSION N/A 02/03/2019   Procedure: TRANSESOPHAGEAL ECHOCARDIOGRAM (TEE);  Surgeon: Jake Bathe, MD;  Location: [facility] ENDOSCOPY;  Service: Cardiovascular;  Laterality: N/A;  loop  . TEE WITHOUT CARDIOVERSION N/A 09/24/2021   Procedure: TRANSESOPHAGEAL ECHOCARDIOGRAM (TEE);  Surgeon: Corliss Skains, MD;  Location: [facility] OR;  Service: Open Heart Surgery;  Laterality: N/A;    SOCIAL HISTORY: Social History   Socioeconomic History  . Marital status: Widowed    Spouse name: Not on file  . Number of children: 0  . Years of education: Not on file  . Highest education level: Not on file  Occupational History  . Occupation: Retired Brewing technologist)  Tobacco Use  . Smoking status: Never  . Smokeless tobacco: Never  Vaping Use  . Vaping status: Never Used  Substance and Sexual Activity  . Alcohol use: Never  . Drug use: Never  . Sexual activity: Not Currently  Other Topics Concern  . Not on file  Social History Narrative   Patient's wife passed away in 10/27/15 due to cancer - she was unable to have children.  Patient has a sister, his niece and her family lives in his home temporarily    Social Determinants of Health   Financial Resource Strain: Low Risk  (01/16/2022)   Overall Financial Resource Strain (CARDIA)   . Difficulty of Paying Living Expenses: Not hard at all  Food  Insecurity: No Food Insecurity (04/03/2023)   Hunger Vital Sign   . Worried About Programme researcher, broadcasting/film/video in the Last Year: Never true   . Ran Out of Food in the Last Year: Never true  Transportation Needs: No Transportation Needs (01/16/2022)   PRAPARE - Transportation   . Lack of Transportation (Medical): No   . Lack of Transportation (Non-Medical): No  Physical Activity:  Not on file  Stress: Not on file  Social Connections: Not on file  Intimate Partner Violence: Not At Risk (04/03/2023)   Humiliation, Afraid, Rape, and Kick questionnaire   . Fear of Current or Ex-Partner: No   . Emotionally Abused: No   . Physically Abused: No   . Sexually Abused: No    FAMILY HISTORY Family History  Problem Relation Age of Onset  . Stroke Mother   . Alzheimer's disease Mother     ALLERGIES:  is allergic to aricept [donepezil].  MEDICATIONS:  Current Outpatient Medications  Medication Sig Dispense Refill  . acetaminophen (TYLENOL) 500 MG tablet Take 500 mg by mouth every 6 (six) hours as needed for mild pain or moderate pain. (Patient not taking: Reported on 06/24/2023)    . aspirin EC 81 MG EC tablet Take 1 tablet (81 mg total) by mouth daily. Swallow whole. (Patient not taking: Reported on 06/24/2023)    . clopidogrel (PLAVIX) 75 MG tablet Take 1 tablet (75 mg total) by mouth daily. 30 tablet 1  . finasteride (PROSCAR) 5 MG tablet Take 1 tablet (5 mg total) by mouth daily. 30 tablet 5  . levothyroxine (SYNTHROID) 50 MCG tablet Take 1 tablet (50 mcg total) by mouth daily before breakfast. 90 tablet 1  . meloxicam (MOBIC) 15 MG tablet Take 15 mg by mouth daily.    . metFORMIN (GLUCOPHAGE) 1000 MG tablet Take 1,000 mg by mouth 2 (two) times daily.    . metoprolol tartrate (LOPRESSOR) 25 MG tablet Take 0.5 tablets (12.5 mg total) by mouth 2 (two) times daily. 30 tablet 1  . ondansetron (ZOFRAN) 8 MG tablet Take 1 tablet (8 mg total) by mouth every 8 (eight) hours as needed for nausea or vomiting. 30  tablet 1  . prochlorperazine (COMPAZINE) 10 MG tablet Take 1 tablet (10 mg total) by mouth every 6 (six) hours as needed for nausea or vomiting. 30 tablet 1  . rosuvastatin (CRESTOR) 20 MG tablet Take 20 mg by mouth daily.    . tamsulosin (FLOMAX) 0.4 MG CAPS capsule Take 0.8 mg by mouth daily.    . traMADol (ULTRAM) 50 MG tablet Take 50 mg by mouth every 6 (six) hours as needed.     No current facility-administered medications for this visit.    PHYSICAL EXAMINATION:  ECOG PERFORMANCE STATUS: 1 - Symptomatic but completely ambulatory   There were no vitals filed for this visit.    There were no vitals filed for this visit.     Physical Exam Vitals and nursing note reviewed.  Constitutional:      Appearance: Normal appearance. He is normal weight. He is not diaphoretic.     Comments: Here alone  HENT:     Head: Normocephalic and atraumatic.      Comments: 0 = area of injury on face    Right Ear: External ear normal.     Left Ear: External ear normal.     Nose: Nose normal.  Eyes:     Conjunctiva/sclera: Conjunctivae normal.     Pupils: Pupils are equal, round, and reactive to light.  Neck:     Comments: No neck pain on exam.  Normal ROM Cardiovascular:     Rate and Rhythm: Normal rate and regular rhythm.     Heart sounds:     No friction rub. No gallop.  Pulmonary:     Effort: Pulmonary effort is normal. No respiratory distress.     Breath sounds: Normal breath sounds.  No stridor.  Abdominal:     General: Abdomen is flat. There is no distension.     Palpations: Abdomen is soft.     Tenderness: There is no abdominal tenderness. There is no guarding.  Musculoskeletal:        General: No swelling. Normal range of motion.     Cervical back: Normal range of motion and neck supple. No rigidity or tenderness.     Right lower leg: No edema.     Left lower leg: No edema.  Lymphadenopathy:     Head:     Right side of head: No submental, submandibular, tonsillar,  preauricular, posterior auricular or occipital adenopathy.     Left side of head: No submental, submandibular, tonsillar, preauricular, posterior auricular or occipital adenopathy.     Cervical: No cervical adenopathy.     Right cervical: No superficial, deep or posterior cervical adenopathy.    Left cervical: No superficial, deep or posterior cervical adenopathy.     Upper Body:     Right upper body: No supraclavicular or axillary adenopathy.     Left upper body: No supraclavicular or axillary adenopathy.     Lower Body: No right inguinal adenopathy. No left inguinal adenopathy.  Skin:    Coloration: Skin is not jaundiced.     Findings: No bruising or erythema.     Comments: Fitzpatrick 2 skin tone.  Significant solar damage to face and arms.   Left leg bandaged.    Neurological:     General: No focal deficit present.     Mental Status: He is alert and oriented to person, place, and time.     Comments: Hard of hearing  Psychiatric:        Mood and Affect: Mood normal.        Behavior: Behavior normal.        Thought Content: Thought content normal.        Judgment: Judgment normal.    LABORATORY DATA: I have personally reviewed the data as listed:  Appointment on 09/08/2023  Component Date Value Ref Range Status  . WBC Count 09/08/2023 13.3 (H)  4.0 - 10.5 K/uL Final  . RBC 09/08/2023 5.09  4.22 - 5.81 MIL/uL Final  . Hemoglobin 09/08/2023 15.0  13.0 - 17.0 g/dL Final  . HCT 96/03/5408 44.2  39.0 - 52.0 % Final  . MCV 09/08/2023 86.8  80.0 - 100.0 fL Final  . MCH 09/08/2023 29.5  26.0 - 34.0 pg Final  . MCHC 09/08/2023 33.9  30.0 - 36.0 g/dL Final  . RDW 81/19/1478 14.4  11.5 - 15.5 % Final  . Platelet Count 09/08/2023 332  150 - 400 K/uL Final  . nRBC 09/08/2023 0.0  0.0 - 0.2 % Final  . Neutrophils Relative % 09/08/2023 74  % Final  . Neutro Abs 09/08/2023 9.9 (H)  1.7 - 7.7 K/uL Final  . Lymphocytes Relative 09/08/2023 17  % Final  . Lymphs Abs 09/08/2023 2.3  0.7 - 4.0  K/uL Final  . Monocytes Relative 09/08/2023 7  % Final  . Monocytes Absolute 09/08/2023 0.9  0.1 - 1.0 K/uL Final  . Eosinophils Relative 09/08/2023 1  % Final  . Eosinophils Absolute 09/08/2023 0.1  0.0 - 0.5 K/uL Final  . Basophils Relative 09/08/2023 0  % Final  . Basophils Absolute 09/08/2023 0.1  0.0 - 0.1 K/uL Final  . Immature Granulocytes 09/08/2023 1  % Final  . Abs Immature Granulocytes 09/08/2023 0.09 (H)  0.00 - 0.07  K/uL Final   Performed at [facility], [address], [place], [place] [address]  . Sodium 09/08/2023 135  135 - 145 mmol/L Final  . Potassium 09/08/2023 4.1  3.5 - 5.1 mmol/L Final  . Chloride 09/08/2023 103  98 - 111 mmol/L Final  . CO2 09/08/2023 22  22 - 32 mmol/L Final  . Glucose, Bld 09/08/2023 218 (H)  70 - 99 mg/dL Final   Glucose reference range applies only to samples taken after fasting for at least 8 hours.  . BUN 09/08/2023 19  8 - 23 mg/dL Final  . Creatinine 29/52/8413 0.67  0.61 - 1.24 mg/dL Final  . Calcium 24/40/1027 8.7 (L)  8.9 - 10.3 mg/dL Final  . Total Protein 09/08/2023 7.0  6.5 - 8.1 g/dL Final  . Albumin 25/36/6440 3.7  3.5 - 5.0 g/dL Final  . AST 34/74/2595 19  15 - 41 U/L Final  . ALT 09/08/2023 16  0 - 44 U/L Final  . Alkaline Phosphatase 09/08/2023 85  38 - 126 U/L Final  . Total Bilirubin 09/08/2023 1.1  0.3 - 1.2 mg/dL Final  . GFR, Estimated 09/08/2023 >60  >60 mL/min Final   Comment: (NOTE) Calculated using the CKD-EPI Creatinine Equation (2021)   . Anion gap 09/08/2023 10  5 - 15 Final   Performed at [facility], [address], [place], [place] [address]  . TSH 09/08/2023 14.919 (H)  0.350 - 4.500 uIU/mL Final   Comment: Performed by a 3rd Generation assay with a functional sensitivity of <=0.01 uIU/mL. Performed at [facility], [address] [place], [place] [address]   . Free T4 09/08/2023 0.60 (L)  0.61 - 1.12 ng/dL Final   Comment: (NOTE) Biotin  ingestion may interfere with free T4 tests. If the results are inconsistent with the TSH level, previous test results, or the clinical presentation, then consider biotin interference. If needed, order repeat testing after stopping biotin. Performed at [facility], [address] [place], [place] [address]     RADIOGRAPHIC STUDIES: I have personally reviewed the radiological images as listed and agree with the findings in the report  No results found.  ASSESSMENT/PLAN 77 y.o. male is here because of  malignant melanoma.  Medical history notable for diabetes mellitus, coronary artery disease, cerebrovascular disease, MI, cataracts  Malignant melanoma, distal left leg, Stage IIC (T4b N0 M0)- Risk factors for this patient are 1) chronic exposure to UV 2) Sunburns 3) Fitzpatrick Grade 2 skin tone March 19, 2023: Shave biopsy left lateral leg demonstrated malignant melanoma, nodular type. Breslow depth 5.1 mm. Clark's level 4. Ulceration present. Mitotic index 16/mm. Pathologic stage T4b. Deep margin involved  April 01 2023- Wide local excision.  Sentinel LN bx not performed due to patient frailty  Therapeutics   Apr 03 2023- Will obtain CBC with diff, CMP, LDH, MRI brain and whole body PET/CT for staging  Patient meets criteria per NCCN Guidelines for adjuvant immunotherapy and will plan on adjuvant Pembrolizumab  May 06 2023- Chemotherapy teaching  May 13 2023:  Cycle 1 Pembrolizumab  June 03 2023:  Cycle 2 Pembrolizumab  June 24 2023:  TSH 0.31 Free T4 11.3-- Will follow as this may be due to thyroiditis from Pembrolizumab.   Cycle 3 Pembrolizumab  July 31 2923- Pembrolizumab currently on hold due to recent bout of mental status changes. Rechecked thyroid studies- TSH 0.81 F4 1.11 Thyroglobulin Ab negative.  Stopped  methimidazole as he  does not have thyrotoxicosis.  August 15 2023- Appears to be back at baseline.  Resume Keytruda. TSH 4.596 FT4 0.60   August 18 2023:   Cycle 4 Keytruda September 08 2023-  TSH 14.92.  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  These include but are not limited to:  Fatigue, Hair loss, low blood counts (anemia, thrombocytopenia) that may necessitate transfusion, bleeding, infection, nausea/ vomiting/Appetite changes/ Constipation/ Diarrhea, mucositis, Neuropathy/ neurologic problems, skin and nail changes such as dry skin and color change, Urine and bladder changes and kidney problems, weight changes, mood changes, decreased libido/fertility problems, damage to heart and lungs.  Some of these side effects can be life threatening, may be permanent and can result in hospitalization and/or death.  In shared decision making patient has agreed to proceed with immunotherapy.    Poor IV access  Apr 29 2023- Underwent portacath placement  Urinary frequency  May 06 2023- Likely secondary to BPH.  PSA 7.11 and began Proscar.    June 24 2023- Urinary frequency improved.  May be due to BPH with contribution of hyperglycemia  August 15 2023- Has not yet seen urology.  Will request again.    Elevated PSA  May 06 2023- PSA 7.11  June 24 2023- Referred to urology for evaluation  Frequent falls  September 08 2023- Likely related to small vessel cerebrovascular disease, gait problems.  No evidence of serious injury from today's event.  Will follow as patient is frail elderly       Cancer Staging  Malignant melanoma (HCC) Staging form: Melanoma of the Skin, AJCC 8th Edition - Clinical stage from 04/03/2023: Stage IIC (cT4b, cN0, cM0) - Signed by Loni Muse, MD on 04/03/2023 Histopathologic type: Nodular melanoma Stage prefix: Initial diagnosis    No problem-specific Assessment & Plan notes found for this encounter.    No orders of the defined types were  placed in this encounter.   40 minutes was spent in patient care.  This included time spent preparing to see the patient (e.g., review of tests), obtaining and/or reviewing separately obtained history, counseling and educating the patient/family/caregiver, ordering medications, tests, or procedures; documenting clinical information in the electronic or other health record, independently interpreting results and communicating results to the patient/family/caregiver as well as coordination of care.       All questions were answered. The patient knows to call the clinic with any problems, questions or concerns.  This note was electronically signed.    Loni Muse, MD  09/29/2023 8:42 AM

## 2023-10-01 ENCOUNTER — Ambulatory Visit: Payer: Medicare HMO | Admitting: Neurology

## 2023-10-01 ENCOUNTER — Inpatient Hospital Stay: Payer: Medicare HMO

## 2023-10-02 ENCOUNTER — Other Ambulatory Visit: Payer: Self-pay

## 2023-10-07 ENCOUNTER — Inpatient Hospital Stay: Payer: Medicare HMO | Attending: Oncology | Admitting: Oncology

## 2023-10-07 ENCOUNTER — Encounter: Payer: Self-pay | Admitting: Oncology

## 2023-10-07 ENCOUNTER — Inpatient Hospital Stay: Payer: Medicare HMO

## 2023-10-07 VITALS — BP 103/75 | HR 81 | Temp 98.1°F | Resp 16 | Ht 69.0 in | Wt 184.5 lb

## 2023-10-07 DIAGNOSIS — C4372 Malignant melanoma of left lower limb, including hip: Secondary | ICD-10-CM | POA: Insufficient documentation

## 2023-10-07 DIAGNOSIS — R4182 Altered mental status, unspecified: Secondary | ICD-10-CM | POA: Diagnosis not present

## 2023-10-07 DIAGNOSIS — Z79899 Other long term (current) drug therapy: Secondary | ICD-10-CM | POA: Insufficient documentation

## 2023-10-07 DIAGNOSIS — Z5111 Encounter for antineoplastic chemotherapy: Secondary | ICD-10-CM | POA: Diagnosis not present

## 2023-10-07 DIAGNOSIS — Z09 Encounter for follow-up examination after completed treatment for conditions other than malignant neoplasm: Secondary | ICD-10-CM | POA: Diagnosis not present

## 2023-10-07 DIAGNOSIS — E039 Hypothyroidism, unspecified: Secondary | ICD-10-CM | POA: Diagnosis not present

## 2023-10-07 DIAGNOSIS — R296 Repeated falls: Secondary | ICD-10-CM

## 2023-10-07 DIAGNOSIS — C439 Malignant melanoma of skin, unspecified: Secondary | ICD-10-CM

## 2023-10-07 LAB — CBC WITH DIFFERENTIAL (CANCER CENTER ONLY)
Abs Immature Granulocytes: 0.09 10*3/uL — ABNORMAL HIGH (ref 0.00–0.07)
Basophils Absolute: 0 10*3/uL (ref 0.0–0.1)
Basophils Relative: 0 %
Eosinophils Absolute: 0.4 10*3/uL (ref 0.0–0.5)
Eosinophils Relative: 4 %
HCT: 41.4 % (ref 39.0–52.0)
Hemoglobin: 14.8 g/dL (ref 13.0–17.0)
Immature Granulocytes: 1 %
Lymphocytes Relative: 31 %
Lymphs Abs: 3.4 10*3/uL (ref 0.7–4.0)
MCH: 30.5 pg (ref 26.0–34.0)
MCHC: 35.7 g/dL (ref 30.0–36.0)
MCV: 85.2 fL (ref 80.0–100.0)
Monocytes Absolute: 1.2 10*3/uL — ABNORMAL HIGH (ref 0.1–1.0)
Monocytes Relative: 11 %
Neutro Abs: 5.7 10*3/uL (ref 1.7–7.7)
Neutrophils Relative %: 53 %
Platelet Count: 349 10*3/uL (ref 150–400)
RBC: 4.86 MIL/uL (ref 4.22–5.81)
RDW: 14.4 % (ref 11.5–15.5)
WBC Count: 10.7 10*3/uL — ABNORMAL HIGH (ref 4.0–10.5)
nRBC: 0 % (ref 0.0–0.2)
nRBC: 0 /100{WBCs}

## 2023-10-07 LAB — CMP (CANCER CENTER ONLY)
ALT: 10 U/L (ref 0–44)
AST: 17 U/L (ref 15–41)
Albumin: 3.8 g/dL (ref 3.5–5.0)
Alkaline Phosphatase: 99 U/L (ref 38–126)
Anion gap: 10 (ref 5–15)
BUN: 17 mg/dL (ref 8–23)
CO2: 25 mmol/L (ref 22–32)
Calcium: 8.9 mg/dL (ref 8.9–10.3)
Chloride: 103 mmol/L (ref 98–111)
Creatinine: 0.94 mg/dL (ref 0.61–1.24)
GFR, Estimated: >60 mL/min (ref >60)
Glucose, Bld: 142 mg/dL — ABNORMAL HIGH (ref 70–99)
Potassium: 4.1 mmol/L (ref 3.5–5.1)
Sodium: 137 mmol/L (ref 135–145)
Total Bilirubin: 0.4 mg/dL (ref <1.2)
Total Protein: 6.3 g/dL — ABNORMAL LOW (ref 6.5–8.1)

## 2023-10-07 LAB — TSH: TSH: 24.622 u[IU]/mL — ABNORMAL HIGH (ref 0.350–4.500)

## 2023-10-07 MED ORDER — SODIUM CHLORIDE 0.9 % IV SOLN: Freq: Once | INTRAVENOUS | Status: AC

## 2023-10-07 MED ORDER — HEPARIN SOD (PORK) LOCK FLUSH 100 UNIT/ML IV SOLN
500.0000 [IU] | Freq: Once | INTRAVENOUS | Status: AC | PRN
Start: 2023-10-07 — End: 2023-10-07
  Administered 2023-10-07: 500 [IU]

## 2023-10-07 MED ORDER — SODIUM CHLORIDE 0.9% FLUSH
10.0000 mL | INTRAVENOUS | Status: AC | PRN
  Administered 2023-10-07 (×2): 10 mL

## 2023-10-07 MED ORDER — SODIUM CHLORIDE 0.9 % IV SOLN
200.0000 mg | Freq: Once | INTRAVENOUS | Status: AC
  Administered 2023-10-07: 200 mg via INTRAVENOUS
  Filled 2023-10-07: qty 8

## 2023-10-07 NOTE — Patient Instructions (Signed)
[facility] - A DEPT OF [facility]  Discharge Instructions: Thank you for choosing [facility] to provide your oncology and hematology care.  If you have a lab appointment with the Cancer Center, please go directly to the Cancer Center and check in at the registration area.   Wear comfortable clothing and clothing appropriate for easy access to any Portacath or PICC line.   We strive to give you quality time with your provider. You may need to reschedule your appointment if you arrive late (15 or more minutes).  Arriving late affects you and other patients whose appointments are after yours.  Also, if you miss three or more appointments without notifying the office, you may be dismissed from the clinic at the provider's discretion.      For prescription refill requests, have your pharmacy contact our office and allow 72 hours for refills to be completed.    Today you received the following chemotherapy and/or immunotherapy agents Keytruda      To help prevent nausea and vomiting after your treatment, we encourage you to take your nausea medication as directed.  BELOW ARE SYMPTOMS THAT SHOULD BE REPORTED IMMEDIATELY: *FEVER GREATER THAN 100.4 F (38 C) OR HIGHER *CHILLS OR SWEATING *NAUSEA AND VOMITING THAT IS NOT CONTROLLED WITH YOUR NAUSEA MEDICATION *UNUSUAL SHORTNESS OF BREATH *UNUSUAL BRUISING OR BLEEDING *URINARY PROBLEMS (pain or burning when urinating, or frequent urination) *BOWEL PROBLEMS (unusual diarrhea, constipation, pain near the anus) TENDERNESS IN MOUTH AND THROAT WITH OR WITHOUT PRESENCE OF ULCERS (sore throat, sores in mouth, or a toothache) UNUSUAL RASH, SWELLING OR PAIN  UNUSUAL VAGINAL DISCHARGE OR ITCHING   Items with * indicate a potential emergency and should be followed up as soon as possible or go to the Emergency Department if any problems should occur.  Please show the CHEMOTHERAPY ALERT CARD or IMMUNOTHERAPY  ALERT CARD at check-in to the Emergency Department and triage nurse.  Should you have questions after your visit or need to cancel or reschedule your appointment, please contact [facility] - A DEPT OF [facility]  Dept: [phone]  and follow the prompts.  Office hours are 8:00 a.m. to 4:30 p.m. Monday - Friday. Please note that voicemails left after 4:00 p.m. may not be returned until the following business day.  We are closed weekends and major holidays. You have access to a nurse at all times for urgent questions. Please call the main number to the clinic Dept: [phone] and follow the prompts.  For any non-urgent questions, you may also contact your provider using MyChart. We now offer e-Visits for anyone 46 and older to request care online for non-urgent symptoms. For details visit [email or web address].   Also download the MyChart app! Go to the app store, search "MyChart", open the app, select [place], and log in with your MyChart username and password.

## 2023-10-07 NOTE — Progress Notes (Signed)
Patient states that he does not know if he has started taking Synthroid. Patient educated on importance of starting this medication as soon as possible. Patient verbalized understanding and states that he will go to [facility] and pick up his prescription.

## 2023-10-07 NOTE — Progress Notes (Signed)
[facility] Cancer Follow up Visit:  Patient Care Team: Gus Height, [place] as PCP - General (Family Medicine) [name], MD as PCP - Cardiology (Cardiology) Jenene Slicker, Deberah Castle, MD as Consulting Physician (Optometry) Olegario Shearer, MD as Referring Physician (Dermatology) Loni Muse, MD as Consulting Physician (Internal Medicine)  CHIEF COMPLAINTS/PURPOSE OF CONSULTATION:  Oncology History  Malignant melanoma (HCC)  04/03/2023 Initial Diagnosis   Malignant melanoma (HCC)   04/03/2023 Cancer Staging   Staging form: Melanoma of the Skin, AJCC 8th Edition - Clinical stage from 04/03/2023: Stage IIC (cT4b, cN0, cM0) - Signed by Loni Muse, MD on 04/03/2023 Histopathologic type: Nodular melanoma Stage prefix: Initial diagnosis   05/13/2023 -  Chemotherapy   Patient is on Treatment Plan : MELANOMA Pembrolizumab (200) q21d       HISTORY OF PRESENTING ILLNESS: Jon Nelson 77 y.o. 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  Breslow depth 5.1 mm.  Clark's level 4.  Ulceration present.  Mitotic index 16/mm.  Pathologic stage T4b.  Deep margin involved  Apr 03 2023:  [facility] Medical Oncology Consult Patient here with sister in law [name] who is also his power of attorney.  He has had a mole on the left lateral leg for years but in the last year it degenerated into a sore which didn't heal which prompted him to see a dermatologist.  He has been followed by dermatology for removal of non-melanomatous skin cancers on his face and arms over the years.  He underwent a shave biopsy of the left leg lesion on March 19 2023.  He has not been noted to have in transit lesions.  April 01 2023 he underwent a wide local excision of the region but did not undergo  inguinal lymphadenectomy.     Social:  Widowed.  Lives with his dog.  Worked for [employer] where he worked outside.  No smoking.  EtOH none  [facility] Mother died 20 dementia Father estranged No siblings.     Apr 12 2023:  MRI brain No evidence of metastatic disease. 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  Apr 14 2023:  Whole body PET/CT Postsurgical changes in the lateral left lower leg, as above.  No findings suspicious for metastatic disease.   Apr 29 2023:   Has undergone port placement since last visit.  Has not yet been scheduled for teaching or therapy.  Discussed risks and benefits of immunotherapy    May 06 2023:   Experiencing constipation.  Has urinary frequency but not interested in seeing a urologist.  To undergo chemotherapy teaching today.  Will check PSA  May 13 2023:  Cycle 1 Pembrolizumab  June 03 2023:  Cycle 2 Pembrolizumab  June 24 2023:    LE edema improved.  Does not check FSBG's.  Sister could not come to visit today because she struck a deer while driving.    TSH 0.31 Free T4 11.3 CMP notable for glucose 276  Cycle 3 Pembrolizumab  June 30 2023:  Admitted to [facility] with mental status changes.   CT head without contrast   Generalized cerebral atrophy with chronic white matter small vessel ischemic changes.  No acute intracranial abnormality.  Carotid dopplers-   Right- moderate heterogeneous and calcified plaque,  with  no hemodynamically significant stenosis by duplex criteria in  the extracranial cerebrovascular circulation.   Left- Heterogeneous and partially calcified plaque at the left carotid bifurcation, with discordant results regarding degree of stenosis by  established duplex criteria. Peak velocity suggests 50%-69% stenosis, with the ICA/ CCA ratio suggesting a lesser degree of stenosis   CT neck angiography- No emergent vascular finding.  Widespread atherosclerosis with up to 50% left ICA bulb and  left V4 segment stenoses. Moderate heterogeneity of plaque at the left  ICA bulb  MRI head- No evidence of acute intracranial abnormality. Remote left posterior frontal cortical infarct and moderate chronic microvascular ischemic change.  Cardiac ECHO- LVEF 55-60%.  Aortic valve mildly sclerotic without stenosis.  Trivial MR.   Labs notable for Sodium 133 Glucose 150.  Urine specific gravity 1.023  TSH < 0.02 FT4 3.42 (ULN 3.42)  FT3 6.47 (ULN 5.27)  Patient was placed on methimidazole for hyperthyroidism  Patient was seen by neurology.   July 15 2023:  Scheduled follow up for melanoma   July 31 2023:    States that he feels great but does stay sleepy for awhile.  Patient can't provide much information as to what happened on the day of admission to the hospital.  Here alone today.  Holding treatment pending results of lab testing  August 15 2023:   Feels well.  Worked in yard yesterday.  Has not made it to urologist.  Had nocturia x 5 last night TSH 4.596 FT4 0.60  August 18 2023:  Cycle 4 Keytruda  September 08 2023:    This morning tripped and fell face first while at [facility].  Struck his face on the ground and required assistance to get up.  No LOC, HA, or neck pain.    September 10 2023:  Cycle 5 Keytruda  October 07 2023:  Scheduled follow up for melanoma.  No falls since last visit.  Feels well overall.   Cycle 6 Keytruda  Review of Systems  Constitutional:  Negative for appetite change, chills, fatigue, fever and unexpected weight change.  HENT:   Positive for hearing loss. Negative for lump/mass, mouth sores, nosebleeds, sore throat and trouble swallowing.        Deaf left ear  Eyes:  Positive for eye problems. Negative for icterus.       Vision changes:  None  Respiratory:  Negative for chest tightness, cough, hemoptysis and wheezing.        DOE on walking up hill  Cardiovascular:  Positive for leg swelling. Negative for chest pain and palpitations.       PND:   none Orthopnea:  none  Gastrointestinal:  Negative for abdominal distention, abdominal pain, blood in stool, constipation, diarrhea, nausea and vomiting.  Endocrine: Negative for hot flashes.       Cold intolerance:  none Heat intolerance:  none  Genitourinary:  Positive for frequency. Negative for bladder incontinence, difficulty urinating, dysuria and hematuria.        Nocturia x 3 to 4  Musculoskeletal:  Negative for arthralgias, back pain, gait problem, myalgias, neck pain and neck stiffness.  Skin:  Negative for rash.       Chronic pruritus of arms.  Healing from surgery to left leg  Neurological:  Negative for extremity weakness, gait problem, headaches, numbness and speech difficulty.       Occasional dizziness particularly when bending over.  No falls   Hematological:  Negative for adenopathy. Does not bruise/bleed easily.  Psychiatric/Behavioral:  Negative  for sleep disturbance and suicidal ideas. The patient is not nervous/anxious.     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)    Old myocardial infarct    Other amnesia    TIA (transient ischemic attack)     SURGICAL HISTORY: Past Surgical History:  Procedure Laterality Date   CATARACT EXTRACTION Left 08/15/2020   CORONARY ARTERY BYPASS GRAFT N/A 09/24/2021   Procedure: CORONARY ARTERY BYPASS GRAFTING (CABG) TIMES THREE , ON PUMP, USING LEFT INTERNAL MAMMARY ARTERY AND ENDOSCOPICALLY HARVESTED RIGHT GREATER SAPHENOUS VEIN;  Surgeon: Corliss Skains, MD;  Location: [facility] OR;  Service: Open Heart Surgery;  Laterality: N/A;   ENDOVEIN HARVEST OF GREATER SAPHENOUS VEIN Right 09/24/2021   Procedure:  ENDOVEIN HARVEST OF GREATER SAPHENOUS VEIN;  Surgeon: Corliss Skains, MD;  Location: [facility] OR;  Service: Open Heart Surgery;  Laterality: Right;   HEMORRHOID SURGERY     HERNIA REPAIR  10/31/2020   LEFT HEART CATH AND CORONARY ANGIOGRAPHY N/A 09/18/2021   Procedure: LEFT HEART CATH AND CORONARY ANGIOGRAPHY;  Surgeon: Lyn Records, MD;  Location: [facility] INVASIVE CV LAB;  Service: Cardiovascular;  Laterality: N/A;   LOOP RECORDER INSERTION N/A 02/03/2019   Procedure: LOOP RECORDER INSERTION;  Surgeon: Regan Lemming, MD;  Location: [facility] INVASIVE CV LAB;  Service: Cardiovascular;  Laterality: N/A;   MOLE REMOVAL     PORTA CATH INSERTION     SKIN CANCER EXCISION     TEE WITHOUT CARDIOVERSION N/A 02/03/2019   Procedure: TRANSESOPHAGEAL ECHOCARDIOGRAM (TEE);  Surgeon: Jake Bathe, MD;  Location: [facility] ENDOSCOPY;  Service: Cardiovascular;  Laterality: N/A;  loop   TEE WITHOUT CARDIOVERSION N/A 09/24/2021   Procedure: TRANSESOPHAGEAL ECHOCARDIOGRAM (TEE);  Surgeon: Corliss Skains, MD;  Location: [facility] OR;  Service: Open Heart Surgery;  Laterality: N/A;    SOCIAL HISTORY: Social History   Socioeconomic History   Marital status: Widowed    Spouse name: Not on file   Number of children: 0   Years of education: Not on file   Highest education level: Not on file  Occupational History   Occupation: Retired Brewing technologist)  Tobacco Use   Smoking status: Never   Smokeless tobacco: Never  Vaping Use   Vaping status: Never Used  Substance and Sexual Activity   Alcohol use: Never   Drug use: Never   Sexual activity: Not Currently  Other Topics Concern   Not on file  Social History Narrative   Patient's wife passed away in 11-01-2015 due to cancer - she was unable to have children.  Patient has a sister, his niece and her family lives in his home temporarily    Social Determinants of Health   Financial Resource Strain: Low Risk  (01/16/2022)   Overall Financial Resource Strain (CARDIA)    Difficulty  of Paying Living Expenses: Not hard at all  Food Insecurity: No Food Insecurity (04/03/2023)   Hunger Vital Sign    Worried About Running Out of Food in the Last Year: Never true    Ran Out of Food in the Last Year: Never true  Transportation Needs: No Transportation Needs (01/16/2022)   PRAPARE -  Administrator, Civil Service (Medical): No    Lack of Transportation (Non-Medical): No  Physical Activity: Not on file  Stress: Not on file  Social Connections: Not on file  Intimate Partner Violence: Not At Risk (04/03/2023)   Humiliation, Afraid, Rape, and Kick questionnaire    Fear of Current or Ex-Partner: No    Emotionally Abused: No    Physically Abused: No    Sexually Abused: No    FAMILY HISTORY Family History  Problem Relation Age of Onset   Stroke Mother    Alzheimer's disease Mother     ALLERGIES:  is allergic to aricept [donepezil].  MEDICATIONS:  Current Outpatient Medications  Medication Sig Dispense Refill   acetaminophen (TYLENOL) 500 MG tablet Take 500 mg by mouth every 6 (six) hours as needed for mild pain or moderate pain. (Patient not taking: Reported on 06/24/2023)     aspirin EC 81 MG EC tablet Take 1 tablet (81 mg total) by mouth daily. Swallow whole. (Patient not taking: Reported on 06/24/2023)     clopidogrel (PLAVIX) 75 MG tablet Take 1 tablet (75 mg total) by mouth daily. 30 tablet 1   finasteride (PROSCAR) 5 MG tablet Take 1 tablet (5 mg total) by mouth daily. 30 tablet 5   levothyroxine (SYNTHROID) 50 MCG tablet Take 1 tablet (50 mcg total) by mouth daily before breakfast. 90 tablet 1   meloxicam (MOBIC) 15 MG tablet Take 15 mg by mouth daily.     metFORMIN (GLUCOPHAGE) 1000 MG tablet Take 1,000 mg by mouth 2 (two) times daily.     metoprolol tartrate (LOPRESSOR) 25 MG tablet Take 0.5 tablets (12.5 mg total) by mouth 2 (two) times daily. 30 tablet 1   ondansetron (ZOFRAN) 8 MG tablet Take 1 tablet (8 mg total) by mouth every 8 (eight) hours as needed  for nausea or vomiting. 30 tablet 1   prochlorperazine (COMPAZINE) 10 MG tablet Take 1 tablet (10 mg total) by mouth every 6 (six) hours as needed for nausea or vomiting. 30 tablet 1   rosuvastatin (CRESTOR) 20 MG tablet Take 20 mg by mouth daily.     tamsulosin (FLOMAX) 0.4 MG CAPS capsule Take 0.8 mg by mouth daily.     traMADol (ULTRAM) 50 MG tablet Take 50 mg by mouth every 6 (six) hours as needed.     No current facility-administered medications for this visit.    PHYSICAL EXAMINATION:  ECOG PERFORMANCE STATUS: 1 - Symptomatic but completely ambulatory   There were no vitals filed for this visit.    There were no vitals filed for this visit.     Physical Exam Vitals and nursing note reviewed.  Constitutional:      Appearance: Normal appearance. He is normal weight. He is not diaphoretic.     Comments: Here alone  HENT:     Head: Normocephalic and atraumatic.      Comments: 0 = area of injury on face    Right Ear: External ear normal.     Left Ear: External ear normal.     Nose: Nose normal.  Eyes:     Conjunctiva/sclera: Conjunctivae normal.     Pupils: Pupils are equal, round, and reactive to light.  Neck:     Comments: No neck pain on exam.  Normal ROM Cardiovascular:     Rate and Rhythm: Normal rate and regular rhythm.     Heart sounds:     No friction rub. No gallop.  Pulmonary:  Effort: Pulmonary effort is normal. No respiratory distress.     Breath sounds: Normal breath sounds. No stridor.  Abdominal:     General: Abdomen is flat. There is no distension.     Palpations: Abdomen is soft.     Tenderness: There is no abdominal tenderness. There is no guarding.  Musculoskeletal:        General: No swelling. Normal range of motion.     Cervical back: Normal range of motion and neck supple. No rigidity or tenderness.     Right lower leg: No edema.     Left lower leg: No edema.  Lymphadenopathy:     Head:     Right side of head: No submental,  submandibular, tonsillar, preauricular, posterior auricular or occipital adenopathy.     Left side of head: No submental, submandibular, tonsillar, preauricular, posterior auricular or occipital adenopathy.     Cervical: No cervical adenopathy.     Right cervical: No superficial, deep or posterior cervical adenopathy.    Left cervical: No superficial, deep or posterior cervical adenopathy.     Upper Body:     Right upper body: No supraclavicular or axillary adenopathy.     Left upper body: No supraclavicular or axillary adenopathy.     Lower Body: No right inguinal adenopathy. No left inguinal adenopathy.  Skin:    Coloration: Skin is not jaundiced.     Findings: No bruising or erythema.     Comments: Fitzpatrick 2 skin tone.  Significant solar damage to face and arms.   Left leg bandaged.    Neurological:     General: No focal deficit present.     Mental Status: He is alert and oriented to person, place, and time.     Comments: Hard of hearing  Psychiatric:        Mood and Affect: Mood normal.        Behavior: Behavior normal.        Thought Content: Thought content normal.        Judgment: Judgment normal.     LABORATORY DATA: I have personally reviewed the data as listed:  Appointment on 09/08/2023  Component Date Value Ref Range Status   WBC Count 09/08/2023 13.3 (H)  4.0 - 10.5 K/uL Final   RBC 09/08/2023 5.09  4.22 - 5.81 MIL/uL Final   Hemoglobin 09/08/2023 15.0  13.0 - 17.0 g/dL Final   HCT 38/75/6433 44.2  39.0 - 52.0 % Final   MCV 09/08/2023 86.8  80.0 - 100.0 fL Final   MCH 09/08/2023 29.5  26.0 - 34.0 pg Final   MCHC 09/08/2023 33.9  30.0 - 36.0 g/dL Final   RDW 29/51/8841 14.4  11.5 - 15.5 % Final   Platelet Count 09/08/2023 332  150 - 400 K/uL Final   nRBC 09/08/2023 0.0  0.0 - 0.2 % Final   Neutrophils Relative % 09/08/2023 74  % Final   Neutro Abs 09/08/2023 9.9 (H)  1.7 - 7.7 K/uL Final   Lymphocytes Relative 09/08/2023 17  % Final   Lymphs Abs 09/08/2023  2.3  0.7 - 4.0 K/uL Final   Monocytes Relative 09/08/2023 7  % Final   Monocytes Absolute 09/08/2023 0.9  0.1 - 1.0 K/uL Final   Eosinophils Relative 09/08/2023 1  % Final   Eosinophils Absolute 09/08/2023 0.1  0.0 - 0.5 K/uL Final   Basophils Relative 09/08/2023 0  % Final   Basophils Absolute 09/08/2023 0.1  0.0 - 0.1 K/uL Final   Immature  Granulocytes 09/08/2023 1  % Final   Abs Immature Granulocytes 09/08/2023 0.09 (H)  0.00 - 0.07 K/uL Final   Performed at [facility], [address] [place], [place] [address]   Sodium 09/08/2023 135  135 - 145 mmol/L Final   Potassium 09/08/2023 4.1  3.5 - 5.1 mmol/L Final   Chloride 09/08/2023 103  98 - 111 mmol/L Final   CO2 09/08/2023 22  22 - 32 mmol/L Final   Glucose, Bld 09/08/2023 218 (H)  70 - 99 mg/dL Final   Glucose reference range applies only to samples taken after fasting for at least 8 hours.   BUN 09/08/2023 19  8 - 23 mg/dL Final   Creatinine 60/45/4098 0.67  0.61 - 1.24 mg/dL Final   Calcium 11/91/4782 8.7 (L)  8.9 - 10.3 mg/dL Final   Total Protein 95/62/1308 7.0  6.5 - 8.1 g/dL Final   Albumin 65/78/4696 3.7  3.5 - 5.0 g/dL Final   AST 29/52/8413 19  15 - 41 U/L Final   ALT 09/08/2023 16  0 - 44 U/L Final   Alkaline Phosphatase 09/08/2023 85  38 - 126 U/L Final   Total Bilirubin 09/08/2023 1.1  0.3 - 1.2 mg/dL Final   GFR, Estimated 09/08/2023 >60  >60 mL/min Final   Comment: (NOTE) Calculated using the CKD-EPI Creatinine Equation (2021)    Anion gap 09/08/2023 10  5 - 15 Final   Performed at [facility], [address], [place], [place] [address]   TSH 09/08/2023 14.919 (H)  0.350 - 4.500 uIU/mL Final   Comment: Performed by a 3rd Generation assay with a functional sensitivity of <=0.01 uIU/mL. Performed at [facility], [address] [place], [place] [address]    Free T4 09/08/2023 0.60 (L)  0.61 - 1.12 ng/dL Final   Comment: (NOTE) Biotin ingestion may  interfere with free T4 tests. If the results are inconsistent with the TSH level, previous test results, or the clinical presentation, then consider biotin interference. If needed, order repeat testing after stopping biotin. Performed at [facility], [address] [place], [place] [address]     RADIOGRAPHIC STUDIES: I have personally reviewed the radiological images as listed and agree with the findings in the report  No results found.  ASSESSMENT/PLAN 77 y.o. male is here because of  malignant melanoma.  Medical history notable for diabetes mellitus, coronary artery disease, cerebrovascular disease, MI, cataracts  Malignant melanoma, distal left leg, Stage IIC (T4b N0 M0)- Risk factors for this patient are 1) chronic exposure to UV 2) Sunburns 3) Fitzpatrick Grade 2 skin tone March 19, 2023: Shave biopsy left lateral leg demonstrated malignant melanoma, nodular type. Breslow depth 5.1 mm. Clark's level 4. Ulceration present. Mitotic index 16/mm. Pathologic stage T4b. Deep margin involved  April 01 2023- Wide local excision.  Sentinel LN bx not performed due to patient frailty  Therapeutics   Apr 03 2023- Will obtain CBC with diff, CMP, LDH, MRI brain and whole body PET/CT for staging  Patient meets criteria per NCCN Guidelines for adjuvant immunotherapy and will plan on adjuvant Pembrolizumab  May 06 2023- Chemotherapy teaching  May 13 2023:  Cycle 1 Pembrolizumab  June 03 2023:  Cycle 2 Pembrolizumab  June 24 2023:  TSH 0.31 Free T4 11.3-- Will follow as this may be due to thyroiditis from Pembrolizumab.   Cycle 3 Pembrolizumab  July 31 2923- Pembrolizumab currently on hold due to recent bout of mental  status changes. Rechecked thyroid studies- TSH 0.81 F4 1.11 Thyroglobulin Ab negative.  Stopped  methimidazole as he does not have thyrotoxicosis.  August 15 2023- Appears to be back at baseline.  Resume Keytruda. TSH 4.596 FT4 0.60   August 18 2023:  Cycle 4  Keytruda September 08 2023-  TSH 14.92.  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  Will check TSH/FT4 today to assess if patient is taking Synthroid regularly   Immunotherapy Risks:   May 06 2023- Reiterated potential side effects of immunotherapy with the patient.  These include but are not limited to:  Fatigue, Hair loss, low blood counts (anemia, thrombocytopenia) that may necessitate transfusion, bleeding, infection, nausea/ vomiting/Appetite changes/ Constipation/ Diarrhea, mucositis, Neuropathy/ neurologic problems, skin and nail changes such as dry skin and color change, Urine and bladder changes and kidney problems, weight changes, mood changes, decreased libido/fertility problems, damage to heart and lungs.  Some of these side effects can be life threatening, may be permanent and can result in hospitalization and/or death.  In shared decision making patient has agreed to proceed with immunotherapy.    Poor IV access  Apr 29 2023- Underwent portacath placement  Urinary frequency  May 06 2023- Likely secondary to BPH.  PSA 7.11 and began Proscar.    June 24 2023- Urinary frequency improved.  May be due to BPH with contribution of hyperglycemia  August 15 2023- Has not yet seen urology.  Will request again.    Elevated PSA  May 06 2023- PSA 7.11  June 24 2023- Referred to urology for evaluation  Frequent falls  September 08 2023- Likely related to small vessel cerebrovascular disease, gait problems.  No evidence of serious injury from today's event.  Will follow as patient is frail elderly       Cancer Staging  Malignant melanoma (HCC) Staging form: Melanoma of the Skin, AJCC 8th Edition - Clinical stage from 04/03/2023: Stage IIC (cT4b, cN0, cM0) - Signed by Loni Muse, MD on 04/03/2023 Histopathologic type: Nodular melanoma Stage prefix: Initial diagnosis    No  problem-specific Assessment & Plan notes found for this encounter.    No orders of the defined types were placed in this encounter.   30 minutes was spent in patient care.  This included time spent preparing to see the patient (e.g., review of tests), obtaining and/or reviewing separately obtained history, counseling and educating the patient/family/caregiver, ordering medications, tests, or procedures; documenting clinical information in the electronic or other health record, independently interpreting results and communicating results to the patient/family/caregiver as well as coordination of care.       All questions were answered. The patient knows to call the clinic with any problems, questions or concerns.  This note was electronically signed.    Loni Muse, MD  10/07/2023 1:44 PM

## 2023-10-08 LAB — T4: T4, Total: 3.5 ug/dL — ABNORMAL LOW (ref 4.5–12.0)

## 2023-10-21 ENCOUNTER — Other Ambulatory Visit: Payer: Self-pay | Admitting: Oncology

## 2023-10-21 DIAGNOSIS — C4372 Malignant melanoma of left lower limb, including hip: Secondary | ICD-10-CM

## 2023-10-27 ENCOUNTER — Encounter: Payer: Self-pay | Admitting: Oncology

## 2023-10-28 ENCOUNTER — Inpatient Hospital Stay: Payer: Medicare HMO

## 2023-10-28 ENCOUNTER — Inpatient Hospital Stay (HOSPITAL_BASED_OUTPATIENT_CLINIC_OR_DEPARTMENT_OTHER): Payer: Medicare HMO | Admitting: Oncology

## 2023-10-28 ENCOUNTER — Telehealth: Payer: Self-pay | Admitting: Oncology

## 2023-10-28 VITALS — BP 132/68 | HR 74 | Resp 20 | Ht 69.0 in | Wt 183.7 lb

## 2023-10-28 VITALS — Temp 97.9°F

## 2023-10-28 DIAGNOSIS — Z5111 Encounter for antineoplastic chemotherapy: Secondary | ICD-10-CM | POA: Diagnosis not present

## 2023-10-28 DIAGNOSIS — Z09 Encounter for follow-up examination after completed treatment for conditions other than malignant neoplasm: Secondary | ICD-10-CM | POA: Diagnosis not present

## 2023-10-28 DIAGNOSIS — E039 Hypothyroidism, unspecified: Secondary | ICD-10-CM | POA: Diagnosis not present

## 2023-10-28 DIAGNOSIS — C4372 Malignant melanoma of left lower limb, including hip: Secondary | ICD-10-CM

## 2023-10-28 DIAGNOSIS — Z79899 Other long term (current) drug therapy: Secondary | ICD-10-CM | POA: Diagnosis not present

## 2023-10-28 DIAGNOSIS — R4182 Altered mental status, unspecified: Secondary | ICD-10-CM | POA: Diagnosis not present

## 2023-10-28 LAB — CMP (CANCER CENTER ONLY)
ALT: 7 U/L (ref 0–44)
AST: 14 U/L — ABNORMAL LOW (ref 15–41)
Albumin: 3.8 g/dL (ref 3.5–5.0)
Alkaline Phosphatase: 97 U/L (ref 38–126)
Anion gap: 11 (ref 5–15)
BUN: 15 mg/dL (ref 8–23)
CO2: 23 mmol/L (ref 22–32)
Calcium: 9.1 mg/dL (ref 8.9–10.3)
Chloride: 102 mmol/L (ref 98–111)
Creatinine: 0.97 mg/dL (ref 0.61–1.24)
GFR, Estimated: >60 mL/min (ref >60)
Glucose, Bld: 210 mg/dL — ABNORMAL HIGH (ref 70–99)
Potassium: 4.2 mmol/L (ref 3.5–5.1)
Sodium: 136 mmol/L (ref 135–145)
Total Bilirubin: 0.4 mg/dL (ref <1.2)
Total Protein: 6 g/dL — ABNORMAL LOW (ref 6.5–8.1)

## 2023-10-28 LAB — CBC WITH DIFFERENTIAL (CANCER CENTER ONLY)
Abs Immature Granulocytes: 0.08 10*3/uL — ABNORMAL HIGH (ref 0.00–0.07)
Basophils Absolute: 0 10*3/uL (ref 0.0–0.1)
Basophils Relative: 0 %
Eosinophils Absolute: 0.4 10*3/uL (ref 0.0–0.5)
Eosinophils Relative: 4 %
HCT: 41.1 % (ref 39.0–52.0)
Hemoglobin: 14.8 g/dL (ref 13.0–17.0)
Immature Granulocytes: 1 %
Lymphocytes Relative: 31 %
Lymphs Abs: 3.1 10*3/uL (ref 0.7–4.0)
MCH: 31 pg (ref 26.0–34.0)
MCHC: 36 g/dL (ref 30.0–36.0)
MCV: 86 fL (ref 80.0–100.0)
Monocytes Absolute: 1.1 10*3/uL — ABNORMAL HIGH (ref 0.1–1.0)
Monocytes Relative: 11 %
Neutro Abs: 5.1 10*3/uL (ref 1.7–7.7)
Neutrophils Relative %: 53 %
Platelet Count: 322 10*3/uL (ref 150–400)
RBC: 4.78 MIL/uL (ref 4.22–5.81)
RDW: 14.3 % (ref 11.5–15.5)
WBC Count: 9.8 10*3/uL (ref 4.0–10.5)
nRBC: 0 % (ref 0.0–0.2)
nRBC: 0 /100{WBCs}

## 2023-10-28 MED ORDER — SODIUM CHLORIDE 0.9 % IV SOLN
Freq: Once | INTRAVENOUS | Status: AC
Start: 2023-10-28 — End: 2023-10-28

## 2023-10-28 MED ORDER — SODIUM CHLORIDE 0.9 % IV SOLN
200.0000 mg | Freq: Once | INTRAVENOUS | Status: AC
  Administered 2023-10-28: 200 mg via INTRAVENOUS
  Filled 2023-10-28: qty 8

## 2023-10-28 MED ORDER — LEVOTHYROXINE SODIUM 50 MCG PO TABS: 50.0000 ug | ORAL_TABLET | Freq: Every day | ORAL | 1 refills | Status: AC

## 2023-10-28 MED ORDER — HEPARIN SOD (PORK) LOCK FLUSH 100 UNIT/ML IV SOLN: 500.0000 [IU] | Freq: Once | INTRAVENOUS | Status: DC | PRN

## 2023-10-28 NOTE — Progress Notes (Signed)
[facility] Cancer Follow up Visit:  Patient Care Team: Gus Height, [place] as PCP - General (Family Medicine) [name], MD as PCP - Cardiology (Cardiology) Jenene Slicker, Deberah Castle, MD as Consulting Physician (Optometry) Olegario Shearer, MD as Referring Physician (Dermatology) Loni Muse, MD as Consulting Physician (Internal Medicine)  CHIEF COMPLAINTS/PURPOSE OF CONSULTATION:  Oncology History  Malignant melanoma (HCC)  04/03/2023 Initial Diagnosis   Malignant melanoma (HCC)   04/03/2023 Cancer Staging   Staging form: Melanoma of the Skin, AJCC 8th Edition - Clinical stage from 04/03/2023: Stage IIC (cT4b, cN0, cM0) - Signed by Loni Muse, MD on 04/03/2023 Histopathologic type: Nodular melanoma Stage prefix: Initial diagnosis   05/13/2023 -  Chemotherapy   Patient is on Treatment Plan : MELANOMA Pembrolizumab (200) q21d       HISTORY OF PRESENTING ILLNESS: Jon Nelson 77 y.o. 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  Breslow depth 5.1 mm.  Clark's level 4.  Ulceration present.  Mitotic index 16/mm.  Pathologic stage T4b.  Deep margin involved  Apr 03 2023:  [facility] Medical Oncology Consult Patient here with sister in law [name] who is also his power of attorney.  He has had a mole on the left lateral leg for years but in the last year it degenerated into a sore which didn't heal which prompted him to see a dermatologist.  He has been followed by dermatology for removal of non-melanomatous skin cancers on his face and arms over the years.  He underwent a shave biopsy of the left leg lesion on March 19 2023.  He has not been noted to have in transit lesions.  April 01 2023 he underwent a wide local excision of the region but did not undergo  inguinal lymphadenectomy.     Social:  Widowed.  Lives with his dog.  Worked for [employer] where he worked outside.  No smoking.  EtOH none  [facility] Mother died 54 dementia Father estranged No siblings.     Apr 12 2023:  MRI brain No evidence of metastatic disease. 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  Apr 14 2023:  Whole body PET/CT Postsurgical changes in the lateral left lower leg, as above.  No findings suspicious for metastatic disease.   Apr 29 2023:   Has undergone port placement since last visit.  Has not yet been scheduled for teaching or therapy.  Discussed risks and benefits of immunotherapy    May 06 2023:   Experiencing constipation.  Has urinary frequency but not interested in seeing a urologist.  To undergo chemotherapy teaching today.  Will check PSA  May 13 2023:  Cycle 1 Pembrolizumab  June 03 2023:  Cycle 2 Pembrolizumab  June 24 2023:    LE edema improved.  Does not check FSBG's.  Sister could not come to visit today because she struck a deer while driving.    TSH 0.31 Free T4 11.3 CMP notable for glucose 276  Cycle 3 Pembrolizumab  June 30 2023:  Admitted to [facility] with mental status changes.   CT head without contrast   Generalized cerebral atrophy with chronic white matter small vessel ischemic changes.  No acute intracranial abnormality.  Carotid dopplers-   Right- moderate heterogeneous and calcified plaque,  with  no hemodynamically significant stenosis by duplex criteria in  the extracranial cerebrovascular circulation.   Left- Heterogeneous and partially calcified plaque at the left carotid bifurcation, with discordant results regarding degree of stenosis by  established duplex criteria. Peak velocity suggests 50%-69% stenosis, with the ICA/ CCA ratio suggesting a lesser degree of stenosis   CT neck angiography- No emergent vascular finding.  Widespread atherosclerosis with up to 50% left ICA bulb and  left V4 segment stenoses. Moderate heterogeneity of plaque at the left  ICA bulb  MRI head- No evidence of acute intracranial abnormality. Remote left posterior frontal cortical infarct and moderate chronic microvascular ischemic change.  Cardiac ECHO- LVEF 55-60%.  Aortic valve mildly sclerotic without stenosis.  Trivial MR.   Labs notable for Sodium 133 Glucose 150.  Urine specific gravity 1.023  TSH < 0.02 FT4 3.42 (ULN 3.42)  FT3 6.47 (ULN 5.27)  Patient was placed on methimidazole for hyperthyroidism  Patient was seen by neurology.   July 15 2023:  Scheduled follow up for melanoma   July 31 2023:    States that he feels great but does stay sleepy for awhile.  Patient can't provide much information as to what happened on the day of admission to the hospital.  Here alone today.  Holding treatment pending results of lab testing  August 15 2023:   Feels well.  Worked in yard yesterday.  Has not made it to urologist.  Had nocturia x 5 last night TSH 4.596 FT4 0.60  August 18 2023:  Cycle 4 Keytruda  September 08 2023:    This morning tripped and fell face first while at [facility].  Struck his face on the ground and required assistance to get up.  No LOC, HA, or neck pain.    September 10 2023:  Cycle 5 Keytruda  October 07 2023:    No falls since last visit.  Feels well overall.   Cycle 6 Keytruda  October 28 2023:  Scheduled follow up for melanoma.  Feels well overall.  No falls.  Has not picked up the synthroid prescription.  Resending it to [facility] per patient instructions Cycle 7 Keytruda  Review of Systems  Constitutional:  Negative for appetite change, chills, fatigue, fever and unexpected weight change.  HENT:   Positive for hearing loss. Negative for lump/mass, mouth sores, nosebleeds, sore throat and trouble swallowing.        Deaf left ear  Eyes:  Positive for eye problems. Negative for icterus.       Vision changes:  None  Respiratory:  Negative for  chest tightness, cough, hemoptysis and wheezing.        DOE on walking up hill  Cardiovascular:  Positive for leg swelling. Negative for chest pain and palpitations.       PND:  none Orthopnea:  none  Gastrointestinal:  Negative for abdominal distention, abdominal pain, blood in stool, constipation, diarrhea, nausea and vomiting.  Endocrine: Negative for hot flashes.       Cold intolerance:  none Heat intolerance:  none  Genitourinary:  Positive for frequency. Negative for bladder incontinence, difficulty urinating, dysuria and hematuria.        Nocturia x 3 to 4  Musculoskeletal:  Negative for arthralgias, back pain, gait problem, myalgias, neck pain and neck stiffness.  Skin:  Negative for rash.       Chronic pruritus of arms.  Healing from surgery to left leg  Neurological:  Negative for extremity weakness, gait problem, headaches, numbness  and speech difficulty.       Occasional dizziness particularly when bending over.  No falls   Hematological:  Negative for adenopathy. Does not bruise/bleed easily.  Psychiatric/Behavioral:  Negative for sleep disturbance and suicidal ideas. The patient is not nervous/anxious.     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)    Old myocardial infarct    Other amnesia    TIA (transient ischemic attack)     SURGICAL HISTORY: Past Surgical History:  Procedure Laterality Date   CATARACT EXTRACTION Left 08/15/2020   CORONARY ARTERY BYPASS GRAFT N/A 09/24/2021   Procedure: CORONARY ARTERY BYPASS GRAFTING (CABG) TIMES THREE , ON PUMP, USING LEFT INTERNAL MAMMARY ARTERY AND ENDOSCOPICALLY HARVESTED RIGHT GREATER SAPHENOUS VEIN;   Surgeon: Corliss Skains, MD;  Location: [facility] OR;  Service: Open Heart Surgery;  Laterality: N/A;   ENDOVEIN HARVEST OF GREATER SAPHENOUS VEIN Right 09/24/2021   Procedure: ENDOVEIN HARVEST OF GREATER SAPHENOUS VEIN;  Surgeon: Corliss Skains, MD;  Location: [facility] OR;  Service: Open Heart Surgery;  Laterality: Right;   HEMORRHOID SURGERY     HERNIA REPAIR  2020/11/03   LEFT HEART CATH AND CORONARY ANGIOGRAPHY N/A 09/18/2021   Procedure: LEFT HEART CATH AND CORONARY ANGIOGRAPHY;  Surgeon: Lyn Records, MD;  Location: [facility] INVASIVE CV LAB;  Service: Cardiovascular;  Laterality: N/A;   LOOP RECORDER INSERTION N/A 02/03/2019   Procedure: LOOP RECORDER INSERTION;  Surgeon: Regan Lemming, MD;  Location: [facility] INVASIVE CV LAB;  Service: Cardiovascular;  Laterality: N/A;   MOLE REMOVAL     PORTA CATH INSERTION     SKIN CANCER EXCISION     TEE WITHOUT CARDIOVERSION N/A 02/03/2019   Procedure: TRANSESOPHAGEAL ECHOCARDIOGRAM (TEE);  Surgeon: Jake Bathe, MD;  Location: [facility] ENDOSCOPY;  Service: Cardiovascular;  Laterality: N/A;  loop   TEE WITHOUT CARDIOVERSION N/A 09/24/2021   Procedure: TRANSESOPHAGEAL ECHOCARDIOGRAM (TEE);  Surgeon: Corliss Skains, MD;  Location: [facility] OR;  Service: Open Heart Surgery;  Laterality: N/A;    SOCIAL HISTORY: Social History   Socioeconomic History   Marital status: Widowed    Spouse name: Not on file   Number of children: 0   Years of education: Not on file   Highest education level: Not on file  Occupational History   Occupation: Retired Brewing technologist)  Tobacco Use   Smoking status: Never   Smokeless tobacco: Never  Vaping Use   Vaping status: Never Used  Substance and Sexual Activity   Alcohol use: Never   Drug use: Never   Sexual activity: Not Currently  Other Topics Concern   Not on file  Social History Narrative   Patient's wife passed away in 11/04/2015 due to cancer - she was unable to have children.  Patient has a sister, his niece and her family  lives in his home temporarily    Social Determinants of Health   Financial Resource Strain: Low Risk  (01/16/2022)   Overall Financial Resource Strain (CARDIA)    Difficulty of Paying Living Expenses: Not hard at all  Food Insecurity: No Food Insecurity (04/03/2023)   Hunger Vital Sign    Worried About  Running Out of Food in the Last Year: Never true    Ran Out of Food in the Last Year: Never true  Transportation Needs: No Transportation Needs (01/16/2022)   PRAPARE - Administrator, Civil Service (Medical): No    Lack of Transportation (Non-Medical): No  Physical Activity: Not on file  Stress: Not on file  Social Connections: Not on file  Intimate Partner Violence: Not At Risk (04/03/2023)   Humiliation, Afraid, Rape, and Kick questionnaire    Fear of Current or Ex-Partner: No    Emotionally Abused: No    Physically Abused: No    Sexually Abused: No    FAMILY HISTORY Family History  Problem Relation Age of Onset   Stroke Mother    Alzheimer's disease Mother     ALLERGIES:  is allergic to aricept [donepezil].  MEDICATIONS:  Current Outpatient Medications  Medication Sig Dispense Refill   acetaminophen (TYLENOL) 500 MG tablet Take 500 mg by mouth every 6 (six) hours as needed for mild pain or moderate pain. (Patient not taking: Reported on 06/24/2023)     aspirin EC 81 MG EC tablet Take 1 tablet (81 mg total) by mouth daily. Swallow whole. (Patient not taking: Reported on 06/24/2023)     clopidogrel (PLAVIX) 75 MG tablet Take 1 tablet (75 mg total) by mouth daily. 30 tablet 1   finasteride (PROSCAR) 5 MG tablet Take 1 tablet (5 mg total) by mouth daily. 30 tablet 5   levothyroxine (SYNTHROID) 50 MCG tablet Take 1 tablet (50 mcg total) by mouth daily before breakfast. 90 tablet 1   meloxicam (MOBIC) 15 MG tablet Take 15 mg by mouth daily.     metFORMIN (GLUCOPHAGE) 1000 MG tablet Take 1,000 mg by mouth 2 (two) times daily.     metoprolol tartrate (LOPRESSOR) 25 MG tablet  Take 0.5 tablets (12.5 mg total) by mouth 2 (two) times daily. 30 tablet 1   ondansetron (ZOFRAN) 8 MG tablet Take 1 tablet (8 mg total) by mouth every 8 (eight) hours as needed for nausea or vomiting. 30 tablet 1   prochlorperazine (COMPAZINE) 10 MG tablet Take 1 tablet (10 mg total) by mouth every 6 (six) hours as needed for nausea or vomiting. 30 tablet 1   rosuvastatin (CRESTOR) 20 MG tablet Take 20 mg by mouth daily.     tamsulosin (FLOMAX) 0.4 MG CAPS capsule Take 0.8 mg by mouth daily.     traMADol (ULTRAM) 50 MG tablet Take 50 mg by mouth every 6 (six) hours as needed.     No current facility-administered medications for this visit.   Facility-Administered Medications Ordered in Other Visits  Medication Dose Route Frequency Provider Last Rate Last Admin   sodium chloride flush (NS) 0.9 % injection 10 mL  10 mL Intracatheter PRN Loni Muse, MD   10 mL at 10/07/23 1538    PHYSICAL EXAMINATION:  ECOG PERFORMANCE STATUS: 1 - Symptomatic but completely ambulatory   There were no vitals filed for this visit.    There were no vitals filed for this visit.     Physical Exam Vitals and nursing note reviewed.  Constitutional:      Appearance: Normal appearance. He is normal weight. He is not diaphoretic.     Comments: Here alone  HENT:     Head: Normocephalic and atraumatic.      Comments: 0 = area of injury on face    Right Ear: External ear normal.     Left  Ear: External ear normal.     Nose: Nose normal.  Eyes:     Conjunctiva/sclera: Conjunctivae normal.     Pupils: Pupils are equal, round, and reactive to light.  Neck:     Comments: No neck pain on exam.  Normal ROM Cardiovascular:     Rate and Rhythm: Normal rate and regular rhythm.     Heart sounds:     No friction rub. No gallop.  Pulmonary:     Effort: Pulmonary effort is normal. No respiratory distress.     Breath sounds: Normal breath sounds. No stridor.  Abdominal:     General: Abdomen is flat.  There is no distension.     Palpations: Abdomen is soft.     Tenderness: There is no abdominal tenderness. There is no guarding.  Musculoskeletal:        General: No swelling. Normal range of motion.     Cervical back: Normal range of motion and neck supple. No rigidity or tenderness.     Right lower leg: No edema.     Left lower leg: No edema.  Lymphadenopathy:     Head:     Right side of head: No submental, submandibular, tonsillar, preauricular, posterior auricular or occipital adenopathy.     Left side of head: No submental, submandibular, tonsillar, preauricular, posterior auricular or occipital adenopathy.     Cervical: No cervical adenopathy.     Right cervical: No superficial, deep or posterior cervical adenopathy.    Left cervical: No superficial, deep or posterior cervical adenopathy.     Upper Body:     Right upper body: No supraclavicular or axillary adenopathy.     Left upper body: No supraclavicular or axillary adenopathy.     Lower Body: No right inguinal adenopathy. No left inguinal adenopathy.  Skin:    Coloration: Skin is not jaundiced.     Findings: No bruising or erythema.     Comments: Fitzpatrick 2 skin tone.  Significant solar damage to face and arms.   Left leg bandaged.    Neurological:     General: No focal deficit present.     Mental Status: He is alert and oriented to person, place, and time.     Comments: Hard of hearing  Psychiatric:        Mood and Affect: Mood normal.        Behavior: Behavior normal.        Thought Content: Thought content normal.        Judgment: Judgment normal.    LABORATORY DATA: I have personally reviewed the data as listed:  Clinical Support on 10/07/2023  Component Date Value Ref Range Status   T4, Total 10/07/2023 3.5 (L)  4.5 - 12.0 ug/dL Final   Comment: (NOTE) Performed At: [facility] [address] [place], [place] [id number] Jolene Schimke MD QM:[id number]    Sodium 10/07/2023 137  135 - 145 mmol/L  Final   Potassium 10/07/2023 4.1  3.5 - 5.1 mmol/L Final   Chloride 10/07/2023 103  98 - 111 mmol/L Final   CO2 10/07/2023 25  22 - 32 mmol/L Final   Glucose, Bld 10/07/2023 142 (H)  70 - 99 mg/dL Final   Glucose reference range applies only to samples taken after fasting for at least 8 hours.   BUN 10/07/2023 17  8 - 23 mg/dL Final   Creatinine 28/41/3244 0.94  0.61 - 1.24 mg/dL Final   Calcium 12/04/7251 8.9  8.9 - 10.3 mg/dL Final  Total Protein 10/07/2023 6.3 (L)  6.5 - 8.1 g/dL Final   Albumin 09/28/2535 3.8  3.5 - 5.0 g/dL Final   AST 64/40/3474 17  15 - 41 U/L Final   ALT 10/07/2023 10  0 - 44 U/L Final   Alkaline Phosphatase 10/07/2023 99  38 - 126 U/L Final   Total Bilirubin 10/07/2023 0.4  <1.2 mg/dL Final   GFR, Estimated 10/07/2023 >60  >60 mL/min Final   Comment: (NOTE) Calculated using the CKD-EPI Creatinine Equation (2021)    Anion gap 10/07/2023 10  5 - 15 Final   Performed at [facility], [address] [place], [place], [address]   WBC Count 10/07/2023 10.7 (H)  4.0 - 10.5 K/uL Final   RBC 10/07/2023 4.86  4.22 - 5.81 MIL/uL Final   Hemoglobin 10/07/2023 14.8  13.0 - 17.0 g/dL Final   HCT 38/75/6433 41.4  39.0 - 52.0 % Final   MCV 10/07/2023 85.2  80.0 - 100.0 fL Final   MCH 10/07/2023 30.5  26.0 - 34.0 pg Final   MCHC 10/07/2023 35.7  30.0 - 36.0 g/dL Final   RDW 29/51/8841 14.4  11.5 - 15.5 % Final   Platelet Count 10/07/2023 349  150 - 400 K/uL Final   nRBC 10/07/2023 0.00  0.0 - 0.2 % Final   Neutrophils Relative % 10/07/2023 53  % Final   Neutro Abs 10/07/2023 5.7  1.7 - 7.7 K/uL Final   Lymphocytes Relative 10/07/2023 31  % Final   Lymphs Abs 10/07/2023 3.4  0.7 - 4.0 K/uL Final   Monocytes Relative 10/07/2023 11  % Final   Monocytes Absolute 10/07/2023 1.2 (H)  0.1 - 1.0 K/uL Final   Eosinophils Relative 10/07/2023 4  % Final   Eosinophils Absolute 10/07/2023 0.4  0.0 - 0.5 K/uL Final   Basophils Relative 10/07/2023 0  %  Final   Basophils Absolute 10/07/2023 0.0  0.0 - 0.1 K/uL Final   Immature Granulocytes 10/07/2023 1  % Final   Abs Immature Granulocytes 10/07/2023 0.09 (H)  0.00 - 0.07 K/uL Final   nRBC 10/07/2023 0  0 /100 WBC Final   Performed at [facility], [address], [place], [place], [address]   TSH 10/07/2023 24.622 (H)  0.350 - 4.500 uIU/mL Final   Comment: Performed by a 3rd Generation assay with a functional sensitivity of <=0.01 uIU/mL. Performed at [facility], [address] [place], [place] [address]     RADIOGRAPHIC STUDIES: I have personally reviewed the radiological images as listed and agree with the findings in the report  No results found.  ASSESSMENT/PLAN 77 y.o. male is here because of  malignant melanoma.  Medical history notable for diabetes mellitus, coronary artery disease, cerebrovascular disease, MI, cataracts  Malignant melanoma, distal left leg, Stage IIC (T4b N0 M0)- Risk factors for this patient are 1) chronic exposure to UV 2) Sunburns 3) Fitzpatrick Grade 2 skin tone March 19, 2023: Shave biopsy left lateral leg demonstrated malignant melanoma, nodular type. Breslow depth 5.1 mm. Clark's level 4. Ulceration present. Mitotic index 16/mm. Pathologic stage T4b. Deep margin involved  April 01 2023- Wide local excision.  Sentinel LN bx not performed due to patient frailty  Therapeutics   Apr 03 2023- Will obtain CBC with diff, CMP, LDH, MRI brain and whole body PET/CT for staging  Patient meets criteria per NCCN Guidelines for adjuvant immunotherapy and will plan on adjuvant Pembrolizumab  May 06 2023- Chemotherapy teaching  May 13 2023:  Cycle 1 Pembrolizumab  June 03 2023:  Cycle 2 Pembrolizumab  June 24 2023:  TSH 0.31 Free T4 11.3-- Will follow as this may be due to thyroiditis from Pembrolizumab.   Cycle 3 Pembrolizumab  July 31 2923- Pembrolizumab currently on hold due to recent bout of mental status  changes. Rechecked thyroid studies- TSH 0.81 F4 1.11 Thyroglobulin Ab negative.  Stopped  methimidazole as he does not have thyrotoxicosis.  August 15 2023- Appears to be back at baseline.  Resume Keytruda. TSH 4.596 FT4 0.60   August 18 2023:  Cycle 4 Keytruda September 08 2023-  TSH 14.92.  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  Will check TSH/FT4 today to assess if patient is taking Synthroid regularly   Immunotherapy Risks:   May 06 2023- Reiterated potential side effects of immunotherapy with the patient.  These include but are not limited to:  Fatigue, Hair loss, low blood counts (anemia, thrombocytopenia) that may necessitate transfusion, bleeding, infection, nausea/ vomiting/Appetite changes/ Constipation/ Diarrhea, mucositis, Neuropathy/ neurologic problems, skin and nail changes such as dry skin and color change, Urine and bladder changes and kidney problems, weight changes, mood changes, decreased libido/fertility problems, damage to heart and lungs.  Some of these side effects can be life threatening, may be permanent and can result in hospitalization and/or death.  In shared decision making patient has agreed to proceed with immunotherapy.    Poor IV access  Apr 29 2023- Underwent portacath placement  Urinary frequency  May 06 2023- Likely secondary to BPH.  PSA 7.11 and began Proscar.    June 24 2023- Urinary frequency improved.  May be due to BPH with contribution of hyperglycemia  August 15 2023- Has not yet seen urology.  Will request again.    Elevated PSA  May 06 2023- PSA 7.11  June 24 2023- Referred to urology for evaluation  Frequent falls  September 08 2023- Likely related to small vessel cerebrovascular disease, gait problems.  No evidence of serious injury from today's event.  Will follow as patient is frail elderly       Cancer Staging  Malignant  melanoma (HCC) Staging form: Melanoma of the Skin, AJCC 8th Edition - Clinical stage from 04/03/2023: Stage IIC (cT4b, cN0, cM0) - Signed by Loni Muse, MD on 04/03/2023 Histopathologic type: Nodular melanoma Stage prefix: Initial diagnosis    No problem-specific Assessment & Plan notes found for this encounter.    No orders of the defined types were placed in this encounter.   30 minutes was spent in patient care.  This included time spent preparing to see the patient (e.g., review of tests), obtaining and/or reviewing separately obtained history, counseling and educating the patient/family/caregiver, ordering medications, tests, or procedures; documenting clinical information in the electronic or other health record, independently interpreting results and communicating results to the patient/family/caregiver as well as coordination of care.       All questions were answered. The patient knows to call the clinic with any problems, questions or concerns.  This note was electronically signed.    Loni Muse, MD  10/28/2023 9:19 AM

## 2023-10-28 NOTE — Patient Instructions (Signed)

## 2023-10-28 NOTE — Telephone Encounter (Signed)
Patient has been scheduled for follow-up visit per 10/28/23 LOS.  Pt given an appt calendar with date and time.

## 2023-11-11 ENCOUNTER — Other Ambulatory Visit: Payer: Self-pay | Admitting: Oncology

## 2023-11-11 DIAGNOSIS — C4372 Malignant melanoma of left lower limb, including hip: Secondary | ICD-10-CM

## 2023-11-11 DIAGNOSIS — N401 Enlarged prostate with lower urinary tract symptoms: Secondary | ICD-10-CM | POA: Diagnosis not present

## 2023-11-11 DIAGNOSIS — R972 Elevated prostate specific antigen [PSA]: Secondary | ICD-10-CM | POA: Diagnosis not present

## 2023-11-17 DIAGNOSIS — C61 Malignant neoplasm of prostate: Secondary | ICD-10-CM | POA: Diagnosis not present

## 2023-11-17 DIAGNOSIS — R972 Elevated prostate specific antigen [PSA]: Secondary | ICD-10-CM | POA: Diagnosis not present

## 2023-11-18 ENCOUNTER — Inpatient Hospital Stay: Payer: Medicare HMO | Attending: Oncology | Admitting: Oncology

## 2023-11-18 ENCOUNTER — Inpatient Hospital Stay: Payer: Medicare HMO

## 2023-11-18 VITALS — BP 126/75 | HR 82 | Temp 97.8°F | Resp 16 | Ht 69.0 in | Wt 183.1 lb

## 2023-11-18 DIAGNOSIS — E064 Drug-induced thyroiditis: Secondary | ICD-10-CM | POA: Diagnosis not present

## 2023-11-18 DIAGNOSIS — C4372 Malignant melanoma of left lower limb, including hip: Secondary | ICD-10-CM | POA: Insufficient documentation

## 2023-11-18 DIAGNOSIS — Z09 Encounter for follow-up examination after completed treatment for conditions other than malignant neoplasm: Secondary | ICD-10-CM

## 2023-11-18 DIAGNOSIS — R350 Frequency of micturition: Secondary | ICD-10-CM

## 2023-11-18 DIAGNOSIS — Z5111 Encounter for antineoplastic chemotherapy: Secondary | ICD-10-CM | POA: Diagnosis not present

## 2023-11-18 DIAGNOSIS — C61 Malignant neoplasm of prostate: Secondary | ICD-10-CM | POA: Diagnosis not present

## 2023-11-18 DIAGNOSIS — E032 Hypothyroidism due to medicaments and other exogenous substances: Secondary | ICD-10-CM | POA: Diagnosis not present

## 2023-11-18 DIAGNOSIS — Z79899 Other long term (current) drug therapy: Secondary | ICD-10-CM | POA: Insufficient documentation

## 2023-11-18 DIAGNOSIS — R413 Other amnesia: Secondary | ICD-10-CM

## 2023-11-18 LAB — CMP (CANCER CENTER ONLY)
ALT: 9 U/L (ref 0–44)
AST: 23 U/L (ref 15–41)
Albumin: 3.4 g/dL — ABNORMAL LOW (ref 3.5–5.0)
Alkaline Phosphatase: 96 U/L (ref 38–126)
Anion gap: 9 (ref 5–15)
BUN: 12 mg/dL (ref 8–23)
CO2: 25 mmol/L (ref 22–32)
Calcium: 9 mg/dL (ref 8.9–10.3)
Chloride: 102 mmol/L (ref 98–111)
Creatinine: 0.8 mg/dL (ref 0.61–1.24)
GFR, Estimated: >60 mL/min (ref >60)
Glucose, Bld: 273 mg/dL — ABNORMAL HIGH (ref 70–99)
Potassium: 4.3 mmol/L (ref 3.5–5.1)
Sodium: 136 mmol/L (ref 135–145)
Total Bilirubin: 0.3 mg/dL (ref <1.2)
Total Protein: 5.8 g/dL — ABNORMAL LOW (ref 6.5–8.1)

## 2023-11-18 LAB — CBC WITH DIFFERENTIAL (CANCER CENTER ONLY)
Abs Immature Granulocytes: 0.11 10*3/uL — ABNORMAL HIGH (ref 0.00–0.07)
Basophils Absolute: 0 10*3/uL (ref 0.0–0.1)
Basophils Relative: 0 %
Eosinophils Absolute: 0.3 10*3/uL (ref 0.0–0.5)
Eosinophils Relative: 3 %
HCT: 40.8 % (ref 39.0–52.0)
Hemoglobin: 14.1 g/dL (ref 13.0–17.0)
Immature Granulocytes: 1 %
Lymphocytes Relative: 27 %
Lymphs Abs: 2.8 10*3/uL (ref 0.7–4.0)
MCH: 29.8 pg (ref 26.0–34.0)
MCHC: 34.6 g/dL (ref 30.0–36.0)
MCV: 86.3 fL (ref 80.0–100.0)
Monocytes Absolute: 1 10*3/uL (ref 0.1–1.0)
Monocytes Relative: 9 %
Neutro Abs: 6.4 10*3/uL (ref 1.7–7.7)
Neutrophils Relative %: 60 %
Platelet Count: 336 10*3/uL (ref 150–400)
RBC: 4.73 MIL/uL (ref 4.22–5.81)
RDW: 13.5 % (ref 11.5–15.5)
WBC Count: 10.6 10*3/uL — ABNORMAL HIGH (ref 4.0–10.5)
nRBC: 0 % (ref 0.0–0.2)
nRBC: 0 /100{WBCs}

## 2023-11-18 LAB — T4, FREE: Free T4: 0.64 ng/dL (ref 0.61–1.12)

## 2023-11-18 LAB — TSH: TSH: 20.439 u[IU]/mL — ABNORMAL HIGH (ref 0.350–4.500)

## 2023-11-18 MED ORDER — SODIUM CHLORIDE 0.9 % IV SOLN
200.0000 mg | Freq: Once | INTRAVENOUS | Status: AC
  Administered 2023-11-18: 200 mg via INTRAVENOUS
  Filled 2023-11-18: qty 8

## 2023-11-18 MED ORDER — SODIUM CHLORIDE 0.9% FLUSH
10.0000 mL | INTRAVENOUS | Status: DC | PRN
Start: 2023-11-18 — End: 2023-11-18
  Administered 2023-11-18: 10 mL

## 2023-11-18 MED ORDER — SODIUM CHLORIDE 0.9 % IV SOLN
Freq: Once | INTRAVENOUS | Status: AC
Start: 2023-11-18 — End: 2023-11-18

## 2023-11-18 MED ORDER — HEPARIN SOD (PORK) LOCK FLUSH 100 UNIT/ML IV SOLN
500.0000 [IU] | Freq: Once | INTRAVENOUS | Status: AC | PRN
  Administered 2023-11-18: 500 [IU]

## 2023-11-18 NOTE — Progress Notes (Signed)
[facility] Cancer Follow up Visit:  Patient Care Team: Jon Nelson Height, [place] as PCP - General (Family Medicine) [name], MD as PCP - Cardiology (Cardiology) Jenene Slicker, Deberah Castle, MD as Consulting Physician (Optometry) Olegario Shearer, MD as Referring Physician (Dermatology) Loni Muse, MD as Consulting Physician (Internal Medicine)  CHIEF COMPLAINTS/PURPOSE OF CONSULTATION:  Oncology History  Malignant melanoma (HCC)  04/03/2023 Initial Diagnosis   Malignant melanoma (HCC)   04/03/2023 Cancer Staging   Staging form: Melanoma of the Skin, AJCC 8th Edition - Clinical stage from 04/03/2023: Stage IIC (cT4b, cN0, cM0) - Signed by Loni Muse, MD on 04/03/2023 Histopathologic type: Nodular melanoma Stage prefix: Initial diagnosis   05/13/2023 -  Chemotherapy   Patient is on Treatment Plan : MELANOMA Pembrolizumab (200) q21d       HISTORY OF PRESENTING ILLNESS: Jon Nelson 77 y.o. 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  Breslow depth 5.1 mm.  Clark's level 4.  Ulceration present.  Mitotic index 16/mm.  Pathologic stage T4b.  Deep margin involved  Apr 03 2023:  [facility] Medical Oncology Consult Patient here with sister in law [name] who is also his power of attorney.  He has had a mole on the left lateral leg for years but in the last year it degenerated into a sore which didn't heal which prompted him to see a dermatologist.  He has been followed by dermatology for removal of non-melanomatous skin cancers on his face and arms over the years.  He underwent a shave biopsy of the left leg lesion on March 19 2023.  He has not been noted to have in transit lesions.  April 01 2023 he underwent a wide local excision of the region but did not undergo  inguinal lymphadenectomy.     Social:  Widowed.  Lives with his dog.  Worked for [employer] where he worked outside.  No smoking.  EtOH none  [facility] Mother died 23 dementia Father estranged No siblings.     Apr 12 2023:  MRI brain No evidence of metastatic disease. 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  Apr 14 2023:  Whole body PET/CT Postsurgical changes in the lateral left lower leg, as above.  No findings suspicious for metastatic disease.   Apr 29 2023:   Has undergone port placement since last visit.  Has not yet been scheduled for teaching or therapy.  Discussed risks and benefits of immunotherapy    May 06 2023:   Experiencing constipation.  Has urinary frequency but not interested in seeing a urologist.  To undergo chemotherapy teaching today.  Will check PSA  May 13 2023:  Cycle 1 Pembrolizumab  June 03 2023:  Cycle 2 Pembrolizumab  June 24 2023:    LE edema improved.  Does not check FSBG's.  Sister could not come to visit today because she struck a deer while driving.    TSH 0.31 Free T4 11.3 CMP notable for glucose 276  Cycle 3 Pembrolizumab  June 30 2023:  Admitted to [facility] with mental status changes.   CT head without contrast   Generalized cerebral atrophy with chronic white matter small vessel ischemic changes.  No acute intracranial abnormality.  Carotid dopplers-   Right- moderate heterogeneous and calcified plaque,  with  no hemodynamically significant stenosis by duplex criteria in  the extracranial cerebrovascular circulation.   Left- Heterogeneous and partially calcified plaque at the left carotid bifurcation, with discordant results regarding degree of stenosis by  established duplex criteria. Peak velocity suggests 50%-69% stenosis, with the ICA/ CCA ratio suggesting a lesser degree of stenosis   CT neck angiography- No emergent vascular finding.  Widespread atherosclerosis with up to 50% left ICA bulb and  left V4 segment stenoses. Moderate heterogeneity of plaque at the left  ICA bulb  MRI head- No evidence of acute intracranial abnormality. Remote left posterior frontal cortical infarct and moderate chronic microvascular ischemic change.  Cardiac ECHO- LVEF 55-60%.  Aortic valve mildly sclerotic without stenosis.  Trivial MR.   Labs notable for Sodium 133 Glucose 150.  Urine specific gravity 1.023  TSH < 0.02 FT4 3.42 (ULN 3.42)  FT3 6.47 (ULN 5.27)  Patient was placed on methimidazole for hyperthyroidism  Patient was seen by neurology.   July 15 2023:  Scheduled follow up for melanoma   July 31 2023:    States that he feels great but does stay sleepy for awhile.  Patient can't provide much information as to what happened on the day of admission to the hospital.  Here alone today.  Holding treatment pending results of lab testing  August 15 2023:   Feels well.  Worked in yard yesterday.  Has not made it to urologist.  Had nocturia x 5 last night TSH 4.596 FT4 0.60  August 18 2023:  Cycle 4 Keytruda  September 08 2023:    This morning tripped and fell face first while at [facility].  Struck his face on the ground and required assistance to get up.  No LOC, HA, or neck pain.    September 10 2023:  Cycle 5 Keytruda  October 07 2023:    No falls since last visit.  Feels well overall.   Cycle 6 Keytruda  October 28 2023:    Feels well overall.  No falls.  Has not picked up the synthroid prescription.  Resending it to [facility] per patient instructions Cycle 7 [facility]  November 18 2023:  Scheduled follow up for melanoma.  Recently underwent cystoscopy by Dr. Saddie Benders, performed in his office.  He is not certain if he is taking synthroid.   Cycle 8 Keytruda  Review of Systems  Constitutional:  Negative for appetite change, chills, fatigue, fever and unexpected weight change.  HENT:   Positive for hearing loss. Negative for lump/mass, mouth sores, nosebleeds, sore throat and  trouble swallowing.        Deaf left ear  Eyes:  Positive for eye problems. Negative for icterus.       Vision changes:  None  Respiratory:  Negative for chest tightness, cough, hemoptysis and wheezing.        DOE on walking up hill  Cardiovascular:  Positive for leg swelling. Negative for chest pain and palpitations.       PND:  none Orthopnea:  none  Gastrointestinal:  Negative for abdominal distention, abdominal pain, blood in stool, constipation, diarrhea, nausea and vomiting.  Endocrine: Negative for hot flashes.       Cold intolerance:  none Heat intolerance:  none  Genitourinary:  Positive for frequency. Negative for bladder incontinence, difficulty urinating, dysuria and hematuria.        Nocturia x 3 to 4  Musculoskeletal:  Negative for arthralgias, back pain, gait problem, myalgias, neck pain and neck stiffness.  Skin:  Negative for rash.       Chronic pruritus of arms.  Healing from surgery to left leg  Neurological:  Negative for extremity weakness, gait problem, headaches, numbness and speech difficulty.       Occasional dizziness particularly when bending over.  No falls   Hematological:  Negative for adenopathy. Does not bruise/bleed easily.  Psychiatric/Behavioral:  Negative for sleep disturbance and suicidal ideas. The patient is not nervous/anxious.     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)    Old myocardial infarct    Other amnesia    TIA (transient ischemic attack)     SURGICAL HISTORY: Past Surgical History:  Procedure Laterality Date   CATARACT EXTRACTION Left 08/15/2020   CORONARY ARTERY BYPASS GRAFT N/A 09/24/2021   Procedure:  CORONARY ARTERY BYPASS GRAFTING (CABG) TIMES THREE , ON PUMP, USING LEFT INTERNAL MAMMARY ARTERY AND ENDOSCOPICALLY HARVESTED RIGHT GREATER SAPHENOUS VEIN;  Surgeon: Corliss Skains, MD;  Location: [facility] OR;  Service: Open Heart Surgery;  Laterality: N/A;   ENDOVEIN HARVEST OF GREATER SAPHENOUS VEIN Right 09/24/2021   Procedure: ENDOVEIN HARVEST OF GREATER SAPHENOUS VEIN;  Surgeon: Corliss Skains, MD;  Location: [facility] OR;  Service: Open Heart Surgery;  Laterality: Right;   HEMORRHOID SURGERY     HERNIA REPAIR  12-13-2020   LEFT HEART CATH AND CORONARY ANGIOGRAPHY N/A 09/18/2021   Procedure: LEFT HEART CATH AND CORONARY ANGIOGRAPHY;  Surgeon: Lyn Records, MD;  Location: [facility] INVASIVE CV LAB;  Service: Cardiovascular;  Laterality: N/A;   LOOP RECORDER INSERTION N/A 02/03/2019   Procedure: LOOP RECORDER INSERTION;  Surgeon: Regan Lemming, MD;  Location: [facility] INVASIVE CV LAB;  Service: Cardiovascular;  Laterality: N/A;   MOLE REMOVAL     PORTA CATH INSERTION     SKIN CANCER EXCISION     TEE WITHOUT CARDIOVERSION N/A 02/03/2019   Procedure: TRANSESOPHAGEAL ECHOCARDIOGRAM (TEE);  Surgeon: Jake Bathe, MD;  Location: [facility] ENDOSCOPY;  Service: Cardiovascular;  Laterality: N/A;  loop   TEE WITHOUT CARDIOVERSION N/A 09/24/2021   Procedure: TRANSESOPHAGEAL ECHOCARDIOGRAM (TEE);  Surgeon: Corliss Skains, MD;  Location: [facility] OR;  Service: Open Heart Surgery;  Laterality: N/A;    SOCIAL HISTORY: Social History   Socioeconomic History   Marital status: Widowed    Spouse name: Not on file   Number of children: 0   Years of education: Not on file   Highest education level: Not on file  Occupational History   Occupation: Retired Brewing technologist)  Tobacco Use   Smoking status: Never   Smokeless tobacco: Never  Vaping Use   Vaping status: Never Used  Substance and Sexual Activity   Alcohol use: Never   Drug use: Never   Sexual activity: Not Currently  Other Topics Concern   Not on file   Social History Narrative   Patient's wife passed away in 12/14/15 due to cancer - she was unable to have children.  Patient has a sister, his niece and her family lives in his home temporarily    Social Drivers of Health   Financial Resource Strain: Low Risk  (01/16/2022)   Overall Financial Resource  Strain (CARDIA)    Difficulty of Paying Living Expenses: Not hard at all  Food Insecurity: No Food Insecurity (04/03/2023)   Hunger Vital Sign    Worried About Running Out of Food in the Last Year: Never true    Ran Out of Food in the Last Year: Never true  Transportation Needs: No Transportation Needs (01/16/2022)   PRAPARE - Administrator, Civil Service (Medical): No    Lack of Transportation (Non-Medical): No  Physical Activity: Not on file  Stress: Not on file  Social Connections: Not on file  Intimate Partner Violence: Not At Risk (04/03/2023)   Humiliation, Afraid, Rape, and Kick questionnaire    Fear of Current or Ex-Partner: No    Emotionally Abused: No    Physically Abused: No    Sexually Abused: No    FAMILY HISTORY Family History  Problem Relation Age of Onset   Stroke Mother    Alzheimer's disease Mother     ALLERGIES:  is allergic to aricept [donepezil].  MEDICATIONS:  Current Outpatient Medications  Medication Sig Dispense Refill   clopidogrel (PLAVIX) 75 MG tablet Take 1 tablet (75 mg total) by mouth daily. 30 tablet 1   meloxicam (MOBIC) 15 MG tablet Take 15 mg by mouth daily.     metFORMIN (GLUCOPHAGE) 1000 MG tablet Take 1,000 mg by mouth 2 (two) times daily.     metoprolol tartrate (LOPRESSOR) 25 MG tablet Take 0.5 tablets (12.5 mg total) by mouth 2 (two) times daily. 30 tablet 1   ondansetron (ZOFRAN) 8 MG tablet Take 1 tablet (8 mg total) by mouth every 8 (eight) hours as needed for nausea or vomiting. 30 tablet 1   prochlorperazine (COMPAZINE) 10 MG tablet Take 1 tablet (10 mg total) by mouth every 6 (six) hours as needed for nausea or vomiting. 30  tablet 1   rosuvastatin (CRESTOR) 20 MG tablet Take 20 mg by mouth daily.     tamsulosin (FLOMAX) 0.4 MG CAPS capsule Take 0.8 mg by mouth daily.     traMADol (ULTRAM) 50 MG tablet Take 50 mg by mouth every 6 (six) hours as needed.     acetaminophen (TYLENOL) 500 MG tablet Take 500 mg by mouth every 6 (six) hours as needed for mild pain or moderate pain. (Patient not taking: Reported on 11/18/2023)     aspirin EC 81 MG EC tablet Take 1 tablet (81 mg total) by mouth daily. Swallow whole. (Patient not taking: Reported on 11/18/2023)     levothyroxine (SYNTHROID) 50 MCG tablet Take 1 tablet (50 mcg total) by mouth daily before breakfast. 90 tablet 1   No current facility-administered medications for this visit.   Facility-Administered Medications Ordered in Other Visits  Medication Dose Route Frequency Provider Last Rate Last Admin   sodium chloride flush (NS) 0.9 % injection 10 mL  10 mL Intracatheter PRN Loni Muse, MD   10 mL at 10/07/23 1538    PHYSICAL EXAMINATION:  ECOG PERFORMANCE STATUS: 1 - Symptomatic but completely ambulatory   Vitals:   11/18/23 1321  BP: 126/75  Pulse: 82  Resp: 16  Temp: 97.8 F (36.6 C)  SpO2: 97%      Filed Weights   11/18/23 1321  Weight: 183 lb 1.6 oz (83.1 kg)       Physical Exam Vitals and nursing note reviewed.  Constitutional:      Appearance: Normal appearance. He is normal weight. He is not diaphoretic.     Comments: Here  alone  HENT:     Head: Normocephalic and atraumatic.      Comments: 0 = area of injury on face    Right Ear: External ear normal.     Left Ear: External ear normal.     Nose: Nose normal.  Eyes:     Conjunctiva/sclera: Conjunctivae normal.     Pupils: Pupils are equal, round, and reactive to light.  Neck:     Comments: No neck pain on exam.  Normal ROM Cardiovascular:     Rate and Rhythm: Normal rate and regular rhythm.     Heart sounds:     No friction rub. No gallop.  Pulmonary:      Effort: Pulmonary effort is normal. No respiratory distress.     Breath sounds: Normal breath sounds. No stridor.  Abdominal:     General: Abdomen is flat. There is no distension.     Palpations: Abdomen is soft.     Tenderness: There is no abdominal tenderness. There is no guarding.  Musculoskeletal:        General: No swelling. Normal range of motion.     Cervical back: Normal range of motion and neck supple. No rigidity or tenderness.     Right lower leg: No edema.     Left lower leg: No edema.  Lymphadenopathy:     Head:     Right side of head: No submental, submandibular, tonsillar, preauricular, posterior auricular or occipital adenopathy.     Left side of head: No submental, submandibular, tonsillar, preauricular, posterior auricular or occipital adenopathy.     Cervical: No cervical adenopathy.     Right cervical: No superficial, deep or posterior cervical adenopathy.    Left cervical: No superficial, deep or posterior cervical adenopathy.     Upper Body:     Right upper body: No supraclavicular or axillary adenopathy.     Left upper body: No supraclavicular or axillary adenopathy.     Lower Body: No right inguinal adenopathy. No left inguinal adenopathy.  Skin:    Coloration: Skin is not jaundiced.     Findings: No bruising or erythema.     Comments: Fitzpatrick 2 skin tone.  Significant solar damage to face and arms.   Left leg bandaged.    Neurological:     General: No focal deficit present.     Mental Status: He is alert and oriented to person, place, and time.     Comments: Hard of hearing  Psychiatric:        Mood and Affect: Mood normal.        Behavior: Behavior normal.        Thought Content: Thought content normal.        Judgment: Judgment normal.     LABORATORY DATA: I have personally reviewed the data as listed:  Infusion on 11/18/2023  Component Date Value Ref Range Status   WBC Count 11/18/2023 10.6 (H)  4.0 - 10.5 K/uL Final   RBC 11/18/2023 4.73   4.22 - 5.81 MIL/uL Final   Hemoglobin 11/18/2023 14.1  13.0 - 17.0 g/dL Final   HCT 46/96/2952 40.8  39.0 - 52.0 % Final   MCV 11/18/2023 86.3  80.0 - 100.0 fL Final   MCH 11/18/2023 29.8  26.0 - 34.0 pg Final   MCHC 11/18/2023 34.6  30.0 - 36.0 g/dL Final   RDW 84/13/2440 13.5  11.5 - 15.5 % Final   Platelet Count 11/18/2023 336  150 - 400 K/uL Final  nRBC 11/18/2023 0.00  0.0 - 0.2 % Final   Neutrophils Relative % 11/18/2023 60  % Final   Neutro Abs 11/18/2023 6.4  1.7 - 7.7 K/uL Final   Lymphocytes Relative 11/18/2023 27  % Final   Lymphs Abs 11/18/2023 2.8  0.7 - 4.0 K/uL Final   Monocytes Relative 11/18/2023 9  % Final   Monocytes Absolute 11/18/2023 1.0  0.1 - 1.0 K/uL Final   Eosinophils Relative 11/18/2023 3  % Final   Eosinophils Absolute 11/18/2023 0.3  0.0 - 0.5 K/uL Final   Basophils Relative 11/18/2023 0  % Final   Basophils Absolute 11/18/2023 0.0  0.0 - 0.1 K/uL Final   Immature Granulocytes 11/18/2023 1  % Final   Abs Immature Granulocytes 11/18/2023 0.11 (H)  0.00 - 0.07 K/uL Final   nRBC 11/18/2023 0  0 /100 WBC Final   Performed at [facility], [address] [place], [place], [address]  Infusion on 10/28/2023  Component Date Value Ref Range Status   Sodium 10/28/2023 136  135 - 145 mmol/L Final   Potassium 10/28/2023 4.2  3.5 - 5.1 mmol/L Final   HEMOLYSIS AT THIS LEVEL MAY AFFECT RESULT   Chloride 10/28/2023 102  98 - 111 mmol/L Final   CO2 10/28/2023 23  22 - 32 mmol/L Final   Glucose, Bld 10/28/2023 210 (H)  70 - 99 mg/dL Final   Glucose reference range applies only to samples taken after fasting for at least 8 hours.   BUN 10/28/2023 15  8 - 23 mg/dL Final   Creatinine 60/45/4098 0.97  0.61 - 1.24 mg/dL Final   Calcium 11/91/4782 9.1  8.9 - 10.3 mg/dL Final   Total Protein 95/62/1308 6.0 (L)  6.5 - 8.1 g/dL Final   Albumin 65/78/4696 3.8  3.5 - 5.0 g/dL Final   AST 29/52/8413 14 (L)  15 - 41 U/L Final   HEMOLYSIS AT THIS  LEVEL MAY AFFECT RESULT   ALT 10/28/2023 7  0 - 44 U/L Final   Alkaline Phosphatase 10/28/2023 97  38 - 126 U/L Final   Total Bilirubin 10/28/2023 0.4  <1.2 mg/dL Final   GFR, Estimated 10/28/2023 >60  >60 mL/min Final   Comment: (NOTE) Calculated using the CKD-EPI Creatinine Equation (2021)    Anion gap 10/28/2023 11  5 - 15 Final   Performed at [facility] at [facility], [address] [place], [place], [address]   WBC Count 10/28/2023 9.8  4.0 - 10.5 K/uL Final   RBC 10/28/2023 4.78  4.22 - 5.81 MIL/uL Final   Hemoglobin 10/28/2023 14.8  13.0 - 17.0 g/dL Final   HCT 02/72/5366 41.1  39.0 - 52.0 % Final   MCV 10/28/2023 86.0  80.0 - 100.0 fL Final   MCH 10/28/2023 31.0  26.0 - 34.0 pg Final   MCHC 10/28/2023 36.0  30.0 - 36.0 g/dL Final   RDW 44/01/4741 14.3  11.5 - 15.5 % Final   Platelet Count 10/28/2023 322  150 - 400 K/uL Final   nRBC 10/28/2023 0.00  0.0 - 0.2 % Final   Neutrophils Relative % 10/28/2023 53  % Final   Neutro Abs 10/28/2023 5.1  1.7 - 7.7 K/uL Final   Lymphocytes Relative 10/28/2023 31  % Final   Lymphs Abs 10/28/2023 3.1  0.7 - 4.0 K/uL Final   Monocytes Relative 10/28/2023 11  % Final   Monocytes Absolute 10/28/2023 1.1 (H)  0.1 - 1.0 K/uL Final  Eosinophils Relative 10/28/2023 4  % Final   Eosinophils Absolute 10/28/2023 0.4  0.0 - 0.5 K/uL Final   Basophils Relative 10/28/2023 0  % Final   Basophils Absolute 10/28/2023 0.0  0.0 - 0.1 K/uL Final   Immature Granulocytes 10/28/2023 1  % Final   Abs Immature Granulocytes 10/28/2023 0.08 (H)  0.00 - 0.07 K/uL Final   nRBC 10/28/2023 0  0 /100 WBC Final   Performed at [facility], [address], [place], [place], [address]    RADIOGRAPHIC STUDIES: I have personally reviewed the radiological images as listed and agree with the findings in the report  No results found.  ASSESSMENT/PLAN 76 y.o. male is here because of  malignant melanoma.  Medical history notable  for diabetes mellitus, coronary artery disease, cerebrovascular disease, MI, cataracts  Malignant melanoma, distal left leg, Stage IIC (T4b N0 M0)- Risk factors for this patient are 1) chronic exposure to UV 2) Sunburns 3) Fitzpatrick Grade 2 skin tone March 19, 2023: Shave biopsy left lateral leg demonstrated malignant melanoma, nodular type. Breslow depth 5.1 mm. Clark's level 4. Ulceration present. Mitotic index 16/mm. Pathologic stage T4b. Deep margin involved  April 01 2023- Wide local excision.  Sentinel LN bx not performed due to patient frailty  Therapeutics   Apr 03 2023- Will obtain CBC with diff, CMP, LDH, MRI brain and whole body PET/CT for staging  Patient meets criteria per NCCN Guidelines for adjuvant immunotherapy and will plan on adjuvant Pembrolizumab  May 06 2023- Chemotherapy teaching  May 13 2023:  Cycle 1 Pembrolizumab  June 03 2023:  Cycle 2 Pembrolizumab  June 24 2023:  TSH 0.31 Free T4 11.3-- Will follow as this may be due to thyroiditis from Pembrolizumab.   Cycle 3 Pembrolizumab  July 31 2923- Pembrolizumab currently on hold due to recent bout of mental status changes. Rechecked thyroid studies- TSH 0.81 F4 1.11 Thyroglobulin Ab negative.  Stopped  methimidazole as he does not have thyrotoxicosis.  August 15 2023- Appears to be back at baseline.  Resume Keytruda. TSH 4.596 FT4 0.60   August 18 2023:  Cycle 4 Keytruda September 08 2023-  TSH 14.92.  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  TSH/T4 24.622/3.5.  Not likely taking Synthroid regularly October 28 2023- Cycle 7 Keytruda November 18 2023- Cycle 8 Keytruda.  Awaiting TSH/T4- unlikely that he is taking synthroid regularly.      Immunotherapy Risks:   May 06 2023- Reiterated potential side effects of immunotherapy with the patient.  These include but are not limited to:  Fatigue, Hair loss, low  blood counts (anemia, thrombocytopenia) that may necessitate transfusion, bleeding, infection, nausea/ vomiting/Appetite changes/ Constipation/ Diarrhea, mucositis, Neuropathy/ neurologic problems, skin and nail changes such as dry skin and color change, Urine and bladder changes and kidney problems, weight changes, mood changes, decreased libido/fertility problems, damage to heart and lungs.  Some of these side effects can be life threatening, may be permanent and can result in hospitalization and/or death.  In shared decision making patient has agreed to proceed with immunotherapy.    Poor IV access  Apr 29 2023- Underwent portacath placement  Urinary frequency  May 06 2023- Likely secondary to BPH.  PSA 7.11 and began Proscar.    June 24 2023- Urinary frequency improved.  May be due to BPH with contribution of hyperglycemia  August 15 2023-  Has not yet seen urology.  Will request again.    November 18 2023- Obtain cystoscopy records.    Elevated PSA  May 06 2023- PSA 7.11  June 24 2023- Referred to urology for evaluation  Frequent falls  September 08 2023- Likely related to small vessel cerebrovascular disease, gait problems.  No evidence of serious injury from today's event.  Will follow as patient is frail elderly       Cancer Staging  Malignant melanoma (HCC) Staging form: Melanoma of the Skin, AJCC 8th Edition - Clinical stage from 04/03/2023: Stage IIC (cT4b, cN0, cM0) - Signed by Loni Muse, MD on 04/03/2023 Histopathologic type: Nodular melanoma Stage prefix: Initial diagnosis    No problem-specific Assessment & Plan notes found for this encounter.    No orders of the defined types were placed in this encounter.   30 minutes was spent in patient care.  This included time spent preparing to see the patient (e.g., review of tests), obtaining and/or reviewing separately obtained history, counseling and educating the patient/family/caregiver, ordering medications,  tests, or procedures; documenting clinical information in the electronic or other health record, independently interpreting results and communicating results to the patient/family/caregiver as well as coordination of care.       All questions were answered. The patient knows to call the clinic with any problems, questions or concerns.  This note was electronically signed.    Loni Muse, MD  11/18/2023 1:46 PM

## 2023-11-18 NOTE — Patient Instructions (Signed)

## 2023-12-04 ENCOUNTER — Other Ambulatory Visit: Payer: Self-pay | Admitting: Oncology

## 2023-12-04 DIAGNOSIS — C4372 Malignant melanoma of left lower limb, including hip: Secondary | ICD-10-CM

## 2023-12-09 ENCOUNTER — Inpatient Hospital Stay: Payer: Medicare HMO

## 2023-12-09 ENCOUNTER — Inpatient Hospital Stay: Payer: Medicare HMO | Admitting: Oncology

## 2023-12-09 NOTE — Progress Notes (Deleted)
 [facility] Cancer Follow up Visit:  Patient Care Team: Vicci Odor, [place] as PCP - General (Family Medicine) Bernie Lamar PARAS, MD as PCP - Cardiology (Cardiology) Russell, Vina BRAVO, MD as Consulting Physician (Optometry) Trudy Lynwood DASEN, MD as Referring Physician (Dermatology) Bernie Guillermina BROCKS, MD as Consulting Physician (Internal Medicine)  CHIEF COMPLAINTS/PURPOSE OF CONSULTATION:  Oncology History  Malignant melanoma (HCC)  04/03/2023 Initial Diagnosis   Malignant melanoma (HCC)   04/03/2023 Cancer Staging   Staging form: Melanoma of the Skin, AJCC 8th Edition - Clinical stage from 04/03/2023: Stage IIC (cT4b, cN0, cM0) - Signed by Bernie Guillermina BROCKS, MD on 04/03/2023 Histopathologic type: Nodular melanoma Stage prefix: Initial diagnosis   05/13/2023 -  Chemotherapy   Patient is on Treatment Plan : MELANOMA Pembrolizumab  (200) q21d       HISTORY OF PRESENTING ILLNESS: Jon Nelson 78 y.o. 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  Breslow depth 5.1 mm.  Clark's level 4.  Ulceration present.  Mitotic index 16/mm.  Pathologic stage T4b.  Deep margin involved  Apr 03 2023:  [facility] Consult Patient here with sister in law Ms Sharlet Free who is also his power of attorney.  He has had a mole on the left lateral leg for years but in the last year it degenerated into a sore which didn't heal which prompted him to see a dermatologist.  He has been followed by dermatology for removal of non-melanomatous skin cancers on his face and arms over the years.  He underwent a shave biopsy of the left leg lesion on March 19 2023.  He has not been noted to have in transit lesions.  April 01 2023 he underwent a wide local excision of the region but did not undergo  inguinal lymphadenectomy.     Social:  Widowed.  Lives with his dog.  Worked for [employer] where he worked outside.  No smoking.  EtOH none  [facility] Mother died 7 dementia Father estranged No siblings.     Apr 12 2023:  MRI brain No evidence of metastatic disease. 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  Apr 14 2023:  Whole body PET/CT Postsurgical changes in the lateral left lower leg, as above.  No findings suspicious for metastatic disease.   Apr 29 2023:   Has undergone port placement since last visit.  Has not yet been scheduled for teaching or therapy.  Discussed risks and benefits of immunotherapy    May 06 2023:   Experiencing constipation.  Has urinary frequency but not interested in seeing a urologist.  To undergo chemotherapy teaching today.  Will check PSA  May 13 2023:  Cycle 1 Pembrolizumab   June 03 2023:  Cycle 2 Pembrolizumab   June 24 2023:    LE edema improved.  Does not check FSBG's.  Sister could not come to visit today because she struck a deer while driving.    TSH 0.31 Free T4 11.3 CMP notable for glucose 276  Cycle 3 Pembrolizumab   June 30 2023:  Admitted to [facility] with mental status changes.   CT head without contrast   Generalized cerebral atrophy with chronic white matter small vessel ischemic changes.  No acute intracranial abnormality.  Carotid dopplers-   Right- moderate heterogeneous and calcified plaque,  with  no hemodynamically significant stenosis by duplex criteria in  the extracranial cerebrovascular circulation.   Left- Heterogeneous and partially calcified plaque at the left carotid bifurcation, with discordant results regarding degree of stenosis by  established duplex criteria. Peak velocity suggests 50%-69% stenosis, with the ICA/ CCA ratio suggesting a lesser degree of stenosis   CT neck angiography- No emergent vascular finding.  Widespread atherosclerosis with up to 50% left ICA bulb and  left V4 segment stenoses. Moderate heterogeneity of plaque at the left  ICA bulb  MRI head- No evidence of acute intracranial abnormality. Remote left posterior frontal cortical infarct and moderate chronic microvascular ischemic change.  Cardiac ECHO- LVEF 55-60%.  Aortic valve mildly sclerotic without stenosis.  Trivial MR.   Labs notable for Sodium 133 Glucose 150.  Urine specific gravity 1.023  TSH < 0.02 FT4 3.42 (ULN 3.42)  FT3 6.47 (ULN 5.27)  Patient was placed on methimidazole for hyperthyroidism  Patient was seen by neurology.   July 15 2023:  Scheduled follow up for melanoma   July 31 2023:    States that he feels great but does stay sleepy for awhile.  Patient can't provide much information as to what happened on the day of admission to the hospital.  Here alone today.  Holding treatment pending results of lab testing  August 15 2023:   Feels well.  Worked in yard yesterday.  Has not made it to urologist.  Had nocturia x 5 last night TSH 4.596 FT4 0.60  August 18 2023:  Cycle 4 Keytruda   September 08 2023:    This morning tripped and fell face first while at [facility].  Struck his face on the ground and required assistance to get up.  No LOC, HA, or neck pain.    September 10 2023:  Cycle 5 Keytruda   October 07 2023:    No falls since last visit.  Feels well overall.   Cycle 6 Keytruda   October 28 2023:    Feels well overall.  No falls.  Has not picked up the synthroid  prescription.  Resending it to [facility] per patient instructions Cycle 7 Keytruda   November 18 2023:  Scheduled follow up for melanoma.  Recently underwent cystoscopy by Dr. Marda, performed in his office.  He is not certain if he is taking synthroid .   Cycle 8 Keytruda   Review of Systems  Constitutional:  Negative for appetite change, chills, fatigue, fever and unexpected weight change.  HENT:   Positive for hearing loss. Negative for lump/mass, mouth sores, nosebleeds, sore throat and  trouble swallowing.        Deaf left ear  Eyes:  Positive for eye problems. Negative for icterus.       Vision changes:  None  Respiratory:  Negative for chest tightness, cough, hemoptysis and wheezing.        DOE on walking up hill  Cardiovascular:  Positive for leg swelling. Negative for chest pain and palpitations.       PND:  none Orthopnea:  none  Gastrointestinal:  Negative for abdominal distention, abdominal pain, blood in stool, constipation, diarrhea, nausea and vomiting.  Endocrine: Negative for hot flashes.       Cold intolerance:  none Heat intolerance:  none  Genitourinary:  Positive for frequency. Negative for bladder incontinence, difficulty urinating, dysuria and hematuria.        Nocturia x 3 to 4  Musculoskeletal:  Negative for arthralgias, back pain, gait problem, myalgias, neck pain and neck stiffness.  Skin:  Negative for rash.       Chronic pruritus of arms.  Healing from surgery to left leg  Neurological:  Negative for extremity weakness, gait problem, headaches, numbness and speech difficulty.       Occasional dizziness particularly when bending over.  No falls   Hematological:  Negative for adenopathy. Does not bruise/bleed easily.  Psychiatric/Behavioral:  Negative for sleep disturbance and suicidal ideas. The patient is not nervous/anxious.     MEDICAL HISTORY: Past Medical History:  Diagnosis Date  . Carotid artery stenosis   . Coronary artery stenosis   . Diabetes (HCC)   . Diabetes mellitus with cardiac complication (HCC)   . GI bleed   . HLD (hyperlipidemia)   . HTN (hypertension)   . Mixed hyperlipidemia   . 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 Old myocardial infarct   . Other amnesia   . TIA (transient ischemic attack)     SURGICAL HISTORY: Past Surgical History:  Procedure Laterality Date  . CATARACT EXTRACTION Left 08/15/2020  . CORONARY ARTERY BYPASS GRAFT N/A 09/24/2021    Procedure: CORONARY ARTERY BYPASS GRAFTING (CABG) TIMES THREE , ON PUMP, USING LEFT INTERNAL MAMMARY ARTERY AND ENDOSCOPICALLY HARVESTED RIGHT GREATER SAPHENOUS VEIN;  Surgeon: Shyrl Linnie KIDD, MD;  Location: [facility] OR;  Service: Open Heart Surgery;  Laterality: N/A;  . ENDOVEIN HARVEST OF GREATER SAPHENOUS VEIN Right 09/24/2021   Procedure: ENDOVEIN HARVEST OF GREATER SAPHENOUS VEIN;  Surgeon: Shyrl Linnie KIDD, MD;  Location: [facility] OR;  Service: Open Heart Surgery;  Laterality: Right;  . HEMORRHOID SURGERY    . HERNIA REPAIR  12/07/20  . LEFT HEART CATH AND CORONARY ANGIOGRAPHY N/A 09/18/2021   Procedure: LEFT HEART CATH AND CORONARY ANGIOGRAPHY;  Surgeon: Claudene Victory ORN, MD;  Location: [facility] INVASIVE CV LAB;  Service: Cardiovascular;  Laterality: N/A;  . LOOP RECORDER INSERTION N/A 02/03/2019   Procedure: LOOP RECORDER INSERTION;  Surgeon: Inocencio Soyla Lunger, MD;  Location: [facility] INVASIVE CV LAB;  Service: Cardiovascular;  Laterality: N/A;  . MOLE REMOVAL    . PORTA CATH INSERTION    . SKIN CANCER EXCISION    . TEE WITHOUT CARDIOVERSION N/A 02/03/2019   Procedure: TRANSESOPHAGEAL ECHOCARDIOGRAM (TEE);  Surgeon: Jeffrie Oneil BROCKS, MD;  Location: [facility] ENDOSCOPY;  Service: Cardiovascular;  Laterality: N/A;  loop  . TEE WITHOUT CARDIOVERSION N/A 09/24/2021   Procedure: TRANSESOPHAGEAL ECHOCARDIOGRAM (TEE);  Surgeon: Shyrl Linnie KIDD, MD;  Location: [facility] OR;  Service: Open Heart Surgery;  Laterality: N/A;    SOCIAL HISTORY: Social History   Socioeconomic History  . Marital status: Widowed    Spouse name: Not on file  . Number of children: 0  . Years of education: Not on file  . Highest education level: Not on file  Occupational History  . Occupation: Retired Brewing Technologist)  Tobacco Use  . Smoking status: Never  . Smokeless tobacco: Never  Vaping Use  . Vaping status: Never Used  Substance and Sexual Activity  . Alcohol use: Never  . Drug use: Never  . Sexual activity: Not Currently  Other  Topics Concern  . Not on file  Social History Narrative   Patient's wife passed away in 12-08-15 due to cancer - she was unable to have children.  Patient has a sister, his niece and her family lives in his home temporarily    Social Drivers of Health   Financial Resource Strain: Low Risk  (01/16/2022)   Overall Financial Resource  Strain (CARDIA)   . Difficulty of Paying Living Expenses: Not hard at all  Food Insecurity: No Food Insecurity (04/03/2023)   Hunger Vital Sign   . Worried About Programme Researcher, Broadcasting/film/video in the Last Year: Never true   . Ran Out of Food in the Last Year: Never true  Transportation Needs: No Transportation Needs (01/16/2022)   PRAPARE - Transportation   . Lack of Transportation (Medical): No   . Lack of Transportation (Non-Medical): No  Physical Activity: Not on file  Stress: Not on file  Social Connections: Not on file  Intimate Partner Violence: Not At Risk (04/03/2023)   Humiliation, Afraid, Rape, and Kick questionnaire   . Fear of Current or Ex-Partner: No   . Emotionally Abused: No   . Physically Abused: No   . Sexually Abused: No    FAMILY HISTORY Family History  Problem Relation Age of Onset  . Stroke Mother   . Alzheimer's disease Mother     ALLERGIES:  is allergic to aricept  [donepezil ].  MEDICATIONS:  Current Outpatient Medications  Medication Sig Dispense Refill  . acetaminophen  (TYLENOL ) 500 MG tablet Take 500 mg by mouth every 6 (six) hours as needed for mild pain or moderate pain. (Patient not taking: Reported on 11/18/2023)    . aspirin  EC 81 MG EC tablet Take 1 tablet (81 mg total) by mouth daily. Swallow whole. (Patient not taking: Reported on 11/18/2023)    . clopidogrel  (PLAVIX ) 75 MG tablet Take 1 tablet (75 mg total) by mouth daily. 30 tablet 1  . levothyroxine  (SYNTHROID ) 50 MCG tablet Take 1 tablet (50 mcg total) by mouth daily before breakfast. 90 tablet 1  . meloxicam (MOBIC) 15 MG tablet Take 15 mg by mouth daily.    . metFORMIN   (GLUCOPHAGE ) 1000 MG tablet Take 1,000 mg by mouth 2 (two) times daily.    . metoprolol  tartrate (LOPRESSOR ) 25 MG tablet Take 0.5 tablets (12.5 mg total) by mouth 2 (two) times daily. 30 tablet 1  . ondansetron  (ZOFRAN ) 8 MG tablet Take 1 tablet (8 mg total) by mouth every 8 (eight) hours as needed for nausea or vomiting. 30 tablet 1  . prochlorperazine  (COMPAZINE ) 10 MG tablet Take 1 tablet (10 mg total) by mouth every 6 (six) hours as needed for nausea or vomiting. 30 tablet 1  . rosuvastatin  (CRESTOR ) 20 MG tablet Take 20 mg by mouth daily.    . tamsulosin (FLOMAX) 0.4 MG CAPS capsule Take 0.8 mg by mouth daily.    . traMADol  (ULTRAM ) 50 MG tablet Take 50 mg by mouth every 6 (six) hours as needed.     No current facility-administered medications for this visit.   Facility-Administered Medications Ordered in Other Visits  Medication Dose Route Frequency Provider Last Rate Last Admin  . sodium chloride  flush (NS) 0.9 % injection 10 mL  10 mL Intracatheter PRN Bernie Guillermina BROCKS, MD   10 mL at 10/07/23 1538    PHYSICAL EXAMINATION:  ECOG PERFORMANCE STATUS: 1 - Symptomatic but completely ambulatory   There were no vitals filed for this visit.     There were no vitals filed for this visit.      Physical Exam Vitals and nursing note reviewed.  Constitutional:      Appearance: Normal appearance. He is normal weight. He is not diaphoretic.     Comments: Here alone  HENT:     Head: Normocephalic and atraumatic.      Comments: 0 = area of injury on  face    Right Ear: External ear normal.     Left Ear: External ear normal.     Nose: Nose normal.  Eyes:     Conjunctiva/sclera: Conjunctivae normal.     Pupils: Pupils are equal, round, and reactive to light.  Neck:     Comments: No neck pain on exam.  Normal ROM Cardiovascular:     Rate and Rhythm: Normal rate and regular rhythm.     Heart sounds:     No friction rub. No gallop.  Pulmonary:     Effort: Pulmonary effort is  normal. No respiratory distress.     Breath sounds: Normal breath sounds. No stridor.  Abdominal:     General: Abdomen is flat. There is no distension.     Palpations: Abdomen is soft.     Tenderness: There is no abdominal tenderness. There is no guarding.  Musculoskeletal:        General: No swelling. Normal range of motion.     Cervical back: Normal range of motion and neck supple. No rigidity or tenderness.     Right lower leg: No edema.     Left lower leg: No edema.  Lymphadenopathy:     Head:     Right side of head: No submental, submandibular, tonsillar, preauricular, posterior auricular or occipital adenopathy.     Left side of head: No submental, submandibular, tonsillar, preauricular, posterior auricular or occipital adenopathy.     Cervical: No cervical adenopathy.     Right cervical: No superficial, deep or posterior cervical adenopathy.    Left cervical: No superficial, deep or posterior cervical adenopathy.     Upper Body:     Right upper body: No supraclavicular or axillary adenopathy.     Left upper body: No supraclavicular or axillary adenopathy.     Lower Body: No right inguinal adenopathy. No left inguinal adenopathy.  Skin:    Coloration: Skin is not jaundiced.     Findings: No bruising or erythema.     Comments: Fitzpatrick 2 skin tone.  Significant solar damage to face and arms.   Left leg bandaged.    Neurological:     General: No focal deficit present.     Mental Status: He is alert and oriented to person, place, and time.     Comments: Hard of hearing  Psychiatric:        Mood and Affect: Mood normal.        Behavior: Behavior normal.        Thought Content: Thought content normal.        Judgment: Judgment normal.    LABORATORY DATA: I have personally reviewed the data as listed:  Infusion on 11/18/2023  Component Date Value Ref Range Status  . Sodium 11/18/2023 136  135 - 145 mmol/L Final  . Potassium 11/18/2023 4.3  3.5 - 5.1 mmol/L Final  .  Chloride 11/18/2023 102  98 - 111 mmol/L Final  . CO2 11/18/2023 25  22 - 32 mmol/L Final  . Glucose, Bld 11/18/2023 273 (H)  70 - 99 mg/dL Final   Glucose reference range applies only to samples taken after fasting for at least 8 hours.  . BUN 11/18/2023 12  8 - 23 mg/dL Final  . Creatinine 87/82/7975 0.80  0.61 - 1.24 mg/dL Final  . Calcium  11/18/2023 9.0  8.9 - 10.3 mg/dL Final  . Total Protein 11/18/2023 5.8 (L)  6.5 - 8.1 g/dL Final  . Albumin  11/18/2023 3.4 (L)  3.5 -  5.0 g/dL Final  . AST 87/82/7975 23  15 - 41 U/L Final  . ALT 11/18/2023 9  0 - 44 U/L Final  . Alkaline Phosphatase 11/18/2023 96  38 - 126 U/L Final  . Total Bilirubin 11/18/2023 0.3  <1.2 mg/dL Final  . GFR, Estimated 11/18/2023 >60  >60 mL/min Final   Comment: (NOTE) Calculated using the CKD-EPI Creatinine Equation (2021)   . Anion gap 11/18/2023 9  5 - 15 Final   Performed at [facility], [address], [place], [place], [address]  . WBC Count 11/18/2023 10.6 (H)  4.0 - 10.5 K/uL Final  . RBC 11/18/2023 4.73  4.22 - 5.81 MIL/uL Final  . Hemoglobin 11/18/2023 14.1  13.0 - 17.0 g/dL Final  . HCT 87/82/7975 40.8  39.0 - 52.0 % Final  . MCV 11/18/2023 86.3  80.0 - 100.0 fL Final  . MCH 11/18/2023 29.8  26.0 - 34.0 pg Final  . MCHC 11/18/2023 34.6  30.0 - 36.0 g/dL Final  . RDW 87/82/7975 13.5  11.5 - 15.5 % Final  . Platelet Count 11/18/2023 336  150 - 400 K/uL Final  . nRBC 11/18/2023 0.00  0.0 - 0.2 % Final  . Neutrophils Relative % 11/18/2023 60  % Final  . Neutro Abs 11/18/2023 6.4  1.7 - 7.7 K/uL Final  . Lymphocytes Relative 11/18/2023 27  % Final  . Lymphs Abs 11/18/2023 2.8  0.7 - 4.0 K/uL Final  . Monocytes Relative 11/18/2023 9  % Final  . Monocytes Absolute 11/18/2023 1.0  0.1 - 1.0 K/uL Final  . Eosinophils Relative 11/18/2023 3  % Final  . Eosinophils Absolute 11/18/2023 0.3  0.0 - 0.5 K/uL Final  . Basophils Relative 11/18/2023 0  % Final  . Basophils Absolute  11/18/2023 0.0  0.0 - 0.1 K/uL Final  . Immature Granulocytes 11/18/2023 1  % Final  . Abs Immature Granulocytes 11/18/2023 0.11 (H)  0.00 - 0.07 K/uL Final  . nRBC 11/18/2023 0  0 /100 WBC Final   Performed at [facility], [address] [place], [place], [address]  . Free T4 11/18/2023 0.64  0.61 - 1.12 ng/dL Final   Comment: (NOTE) Biotin ingestion may interfere with free T4 tests. If the results are inconsistent with the TSH level, previous test results, or the clinical presentation, then consider biotin interference. If needed, order repeat testing after stopping biotin. Performed at [facility], [address], [place], [place] [address]   . TSH 11/18/2023 20.439 (H)  0.350 - 4.500 uIU/mL Final   Comment: Performed by a 3rd Generation assay with a functional sensitivity of <=0.01 uIU/mL. Performed at [facility], [address] [place], [place] [address]     RADIOGRAPHIC STUDIES: I have personally reviewed the radiological images as listed and agree with the findings in the report  No results found.  ASSESSMENT/PLAN 78 y.o. male is here because of  malignant melanoma.  Medical history notable for diabetes mellitus, coronary artery disease, cerebrovascular disease, MI, cataracts  Malignant melanoma, distal left leg, Stage IIC (T4b N0 M0)- Risk factors for this patient are 1) chronic exposure to UV 2) Sunburns 3) Fitzpatrick Grade 2 skin tone March 19, 2023: Shave biopsy left lateral leg demonstrated malignant melanoma, nodular type. Breslow depth 5.1 mm. Clark's level 4. Ulceration present. Mitotic index 16/mm. Pathologic stage T4b. Deep margin involved  April 01 2023- Wide local excision.  Sentinel LN bx not  performed due to patient frailty  Therapeutics   Apr 03 2023- Will obtain CBC with diff, CMP, LDH, MRI brain and whole body PET/CT for staging  Patient meets criteria per NCCN Guidelines for adjuvant immunotherapy  and will plan on adjuvant Pembrolizumab   May 06 2023- Chemotherapy teaching  May 13 2023:  Cycle 1 Pembrolizumab   June 03 2023:  Cycle 2 Pembrolizumab   June 24 2023:  TSH 0.31 Free T4 11.3-- Will follow as this may be due to thyroiditis from Pembrolizumab .   Cycle 3 Pembrolizumab   July 31 2923- Pembrolizumab  currently on hold due to recent bout of mental status changes. Rechecked thyroid  studies- TSH 0.81 F4 1.11 Thyroglobulin Ab negative.  Stopped  methimidazole as he does not have thyrotoxicosis.  August 15 2023- Appears to be back at baseline.  Resume Keytruda . TSH 4.596 FT4 0.60   August 18 2023:  Cycle 4 Keytruda  September 08 2023-  TSH 14.92.  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  TSH/T4 24.622/3.5.  Not likely taking Synthroid  regularly October 28 2023- Cycle 7 Keytruda  November 18 2023- Cycle 8 Keytruda .  Awaiting TSH/T4- unlikely that he is taking synthroid  regularly.      Immunotherapy Risks:   May 06 2023- Reiterated potential side effects of immunotherapy with the patient.  These include but are not limited to:  Fatigue, Hair loss, low blood counts (anemia, thrombocytopenia) that may necessitate transfusion, bleeding, infection, nausea/ vomiting/Appetite changes/ Constipation/ Diarrhea, mucositis, Neuropathy/ neurologic problems, skin and nail changes such as dry skin and color change, Urine and bladder changes and kidney problems, weight changes, mood changes, decreased libido/fertility problems, damage to heart and lungs.  Some of these side effects can be life threatening, may be permanent and can result in hospitalization and/or death.  In shared decision making patient has agreed to proceed with immunotherapy.    Poor IV access  Apr 29 2023- Underwent portacath placement  Urinary frequency  May 06 2023- Likely secondary to BPH.  PSA 7.11 and began Proscar .     June 24 2023- Urinary frequency improved.  May be due to BPH with contribution of hyperglycemia  August 15 2023- Has not yet seen urology.  Will request again.    November 18 2023- Obtain cystoscopy records.    Elevated PSA  May 06 2023- PSA 7.11  June 24 2023- Referred to urology for evaluation  Frequent falls  September 08 2023- Likely related to small vessel cerebrovascular disease, gait problems.  No evidence of serious injury from today's event.  Will follow as patient is frail elderly       Cancer Staging  Malignant melanoma (HCC) Staging form: Melanoma of the Skin, AJCC 8th Edition - Clinical stage from 04/03/2023: Stage IIC (cT4b, cN0, cM0) - Signed by Bernie Guillermina BROCKS, MD on 04/03/2023 Histopathologic type: Nodular melanoma Stage prefix: Initial diagnosis    No problem-specific Assessment & Plan notes found for this encounter.    No orders of the defined types were placed in this encounter.   30 minutes was spent in patient care.  This included time spent preparing to see the patient (e.g., review of tests), obtaining and/or reviewing separately obtained history, counseling and educating the patient/family/caregiver, ordering medications, tests, or procedures; documenting clinical information in the electronic or other health record, independently interpreting results and communicating results to the patient/family/caregiver as well as coordination of care.  All questions were answered. The patient knows to call the clinic with any problems, questions or concerns.  This note was electronically signed.    Guillermina JAYSON Perla, MD  12/09/2023 12:58 PM

## 2023-12-12 ENCOUNTER — Inpatient Hospital Stay: Payer: Medicare HMO | Attending: Oncology

## 2023-12-12 ENCOUNTER — Inpatient Hospital Stay: Payer: Medicare HMO | Admitting: Oncology

## 2023-12-12 ENCOUNTER — Inpatient Hospital Stay: Payer: Medicare HMO

## 2023-12-12 ENCOUNTER — Inpatient Hospital Stay (HOSPITAL_BASED_OUTPATIENT_CLINIC_OR_DEPARTMENT_OTHER): Payer: Medicare HMO | Admitting: Oncology

## 2023-12-12 VITALS — BP 103/72 | HR 84 | Resp 18 | Ht 69.0 in | Wt 184.4 lb

## 2023-12-12 VITALS — Temp 97.9°F

## 2023-12-12 DIAGNOSIS — I10 Essential (primary) hypertension: Secondary | ICD-10-CM | POA: Insufficient documentation

## 2023-12-12 DIAGNOSIS — E039 Hypothyroidism, unspecified: Secondary | ICD-10-CM

## 2023-12-12 DIAGNOSIS — Z09 Encounter for follow-up examination after completed treatment for conditions other than malignant neoplasm: Secondary | ICD-10-CM | POA: Diagnosis not present

## 2023-12-12 DIAGNOSIS — E1159 Type 2 diabetes mellitus with other circulatory complications: Secondary | ICD-10-CM | POA: Diagnosis not present

## 2023-12-12 DIAGNOSIS — E032 Hypothyroidism due to medicaments and other exogenous substances: Secondary | ICD-10-CM | POA: Insufficient documentation

## 2023-12-12 DIAGNOSIS — C4372 Malignant melanoma of left lower limb, including hip: Secondary | ICD-10-CM

## 2023-12-12 DIAGNOSIS — Z79899 Other long term (current) drug therapy: Secondary | ICD-10-CM | POA: Insufficient documentation

## 2023-12-12 DIAGNOSIS — Z5111 Encounter for antineoplastic chemotherapy: Secondary | ICD-10-CM | POA: Diagnosis not present

## 2023-12-12 LAB — CMP (CANCER CENTER ONLY)
ALT: 11 U/L (ref 0–44)
AST: 16 U/L (ref 15–41)
Albumin: 4 g/dL (ref 3.5–5.0)
Alkaline Phosphatase: 105 U/L (ref 38–126)
Anion gap: 10 (ref 5–15)
BUN: 13 mg/dL (ref 8–23)
CO2: 26 mmol/L (ref 22–32)
Calcium: 9.4 mg/dL (ref 8.9–10.3)
Chloride: 101 mmol/L (ref 98–111)
Creatinine: 0.72 mg/dL (ref 0.61–1.24)
GFR, Estimated: >60 mL/min (ref >60)
Glucose, Bld: 195 mg/dL — ABNORMAL HIGH (ref 70–99)
Potassium: 4.1 mmol/L (ref 3.5–5.1)
Sodium: 137 mmol/L (ref 135–145)
Total Bilirubin: 0.5 mg/dL (ref 0.0–1.2)
Total Protein: 6.5 g/dL (ref 6.5–8.1)

## 2023-12-12 LAB — CBC WITH DIFFERENTIAL (CANCER CENTER ONLY)
Abs Immature Granulocytes: 0.07 10*3/uL (ref 0.00–0.07)
Basophils Absolute: 0 10*3/uL (ref 0.0–0.1)
Basophils Relative: 0 %
Eosinophils Absolute: 0.2 10*3/uL (ref 0.0–0.5)
Eosinophils Relative: 2 %
HCT: 44.4 % (ref 39.0–52.0)
Hemoglobin: 15.6 g/dL (ref 13.0–17.0)
Immature Granulocytes: 1 %
Lymphocytes Relative: 24 %
Lymphs Abs: 2.3 10*3/uL (ref 0.7–4.0)
MCH: 30.3 pg (ref 26.0–34.0)
MCHC: 35.1 g/dL (ref 30.0–36.0)
MCV: 86.2 fL (ref 80.0–100.0)
Monocytes Absolute: 1.1 10*3/uL — ABNORMAL HIGH (ref 0.1–1.0)
Monocytes Relative: 12 %
Neutro Abs: 5.9 10*3/uL (ref 1.7–7.7)
Neutrophils Relative %: 61 %
Platelet Count: 315 10*3/uL (ref 150–400)
RBC: 5.15 MIL/uL (ref 4.22–5.81)
RDW: 13.5 % (ref 11.5–15.5)
WBC Count: 9.5 10*3/uL (ref 4.0–10.5)
nRBC: 0 % (ref 0.0–0.2)
nRBC: 0 /100{WBCs}

## 2023-12-12 MED ORDER — SODIUM CHLORIDE 0.9 % IV SOLN: Freq: Once | INTRAVENOUS | Status: AC

## 2023-12-12 MED ORDER — SODIUM CHLORIDE 0.9 % IV SOLN
200.0000 mg | Freq: Once | INTRAVENOUS | Status: AC
  Administered 2023-12-12: 200 mg via INTRAVENOUS
  Filled 2023-12-12: qty 8

## 2023-12-12 MED ORDER — HEPARIN SOD (PORK) LOCK FLUSH 100 UNIT/ML IV SOLN
500.0000 [IU] | Freq: Once | INTRAVENOUS | Status: AC | PRN
Start: 2023-12-12 — End: 2023-12-12
  Administered 2023-12-12: 500 [IU]

## 2023-12-12 MED ORDER — SODIUM CHLORIDE 0.9% FLUSH
10.0000 mL | INTRAVENOUS | Status: DC | PRN
  Administered 2023-12-12: 10 mL

## 2023-12-12 NOTE — Progress Notes (Signed)
 [facility] Cancer Follow up Visit:  Patient Care Team: Vicci Odor, [place] as PCP - General (Family Medicine) Bernie Lamar PARAS, MD as PCP - Cardiology (Cardiology) Russell, Vina BRAVO, MD as Consulting Physician (Optometry) Trudy Lynwood DASEN, MD as Referring Physician (Dermatology) Bernie Guillermina BROCKS, MD as Consulting Physician (Internal Medicine)  CHIEF COMPLAINTS/PURPOSE OF CONSULTATION:  Oncology History  Malignant melanoma (HCC)  04/03/2023 Initial Diagnosis   Malignant melanoma (HCC)   04/03/2023 Cancer Staging   Staging form: Melanoma of the Skin, AJCC 8th Edition - Clinical stage from 04/03/2023: Stage IIC (cT4b, cN0, cM0) - Signed by Bernie Guillermina BROCKS, MD on 04/03/2023 Histopathologic type: Nodular melanoma Stage prefix: Initial diagnosis   05/13/2023 -  Chemotherapy   Patient is on Treatment Plan : MELANOMA Pembrolizumab  (200) q21d       HISTORY OF PRESENTING ILLNESS: Jon Nelson 78 y.o. 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  Breslow depth 5.1 mm.  Clark's level 4.  Ulceration present.  Mitotic index 16/mm.  Pathologic stage T4b.  Deep margin involved  Apr 03 2023:  [facility] Medical Oncology Consult Patient here with sister in law Ms Sharlet Free who is also his power of attorney.  He has had a mole on the left lateral leg for years but in the last year it degenerated into a sore which didn't heal which prompted him to see a dermatologist.  He has been followed by dermatology for removal of non-melanomatous skin cancers on his face and arms over the years.  He underwent a shave biopsy of the left leg lesion on March 19 2023.  He has not been noted to have in transit lesions.  April 01 2023 he underwent a wide local excision of the region but did not undergo  inguinal lymphadenectomy.     Social:  Widowed.  Lives with his dog.  Worked for [employer] where he worked outside.  No smoking.  EtOH none  [facility] Mother died 8 dementia Father estranged No siblings.     Apr 12 2023:  MRI brain No evidence of metastatic disease. 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  Apr 14 2023:  Whole body PET/CT Postsurgical changes in the lateral left lower leg, as above.  No findings suspicious for metastatic disease.   Apr 29 2023:   Has undergone port placement since last visit.  Has not yet been scheduled for teaching or therapy.  Discussed risks and benefits of immunotherapy    May 06 2023:   Experiencing constipation.  Has urinary frequency but not interested in seeing a urologist.  To undergo chemotherapy teaching today.  Will check PSA  May 13 2023:  Cycle 1 Pembrolizumab   June 03 2023:  Cycle 2 Pembrolizumab   June 24 2023:    LE edema improved.  Does not check FSBG's.  Sister could not come to visit today because she struck a deer while driving.    TSH 0.31 Free T4 11.3 CMP notable for glucose 276  Cycle 3 Pembrolizumab   June 30 2023:  Admitted to [facility] with mental status changes.   CT head without contrast   Generalized cerebral atrophy with chronic white matter small vessel ischemic changes.  No acute intracranial abnormality.  Carotid dopplers-   Right- moderate heterogeneous and calcified plaque,  with  no hemodynamically significant stenosis by duplex criteria in  the extracranial cerebrovascular circulation.   Left- Heterogeneous and partially calcified plaque at the left carotid bifurcation, with discordant results regarding degree of stenosis by  established duplex criteria. Peak velocity suggests 50%-69% stenosis, with the ICA/ CCA ratio suggesting a lesser degree of stenosis   CT neck angiography- No emergent vascular finding.  Widespread atherosclerosis with up to 50% left ICA bulb and  left V4 segment stenoses. Moderate heterogeneity of plaque at the left  ICA bulb  MRI head- No evidence of acute intracranial abnormality. Remote left posterior frontal cortical infarct and moderate chronic microvascular ischemic change.  Cardiac ECHO- LVEF 55-60%.  Aortic valve mildly sclerotic without stenosis.  Trivial MR.   Labs notable for Sodium 133 Glucose 150.  Urine specific gravity 1.023  TSH < 0.02 FT4 3.42 (ULN 3.42)  FT3 6.47 (ULN 5.27)  Patient was placed on methimidazole for hyperthyroidism  Patient was seen by neurology.   July 15 2023:  Scheduled follow up for melanoma   July 31 2023:    States that he feels great but does stay sleepy for awhile.  Patient can't provide much information as to what happened on the day of admission to the hospital.  Here alone today.  Holding treatment pending results of lab testing  August 15 2023:   Feels well.  Worked in yard yesterday.  Has not made it to urologist.  Had nocturia x 5 last night TSH 4.596 FT4 0.60  August 18 2023:  Cycle 4 Keytruda   September 08 2023:    This morning tripped and fell face first while at [facility].  Struck his face on the ground and required assistance to get up.  No LOC, HA, or neck pain.    September 10 2023:  Cycle 5 Keytruda   October 07 2023:    No falls since last visit.  Feels well overall.   Cycle 6 Keytruda   October 28 2023:    Feels well overall.  No falls.  Has not picked up the synthroid  prescription.  Resending it to [facility] per patient instructions Cycle 7 Keytruda   November 18 2023:   Recently underwent cystoscopy by Dr. Marda, performed in his office.  He is not certain if he is taking synthroid .   Cycle 8 Keytruda   December 12 2023:  Scheduled follow up for melanoma.  Feels well.   Cycle 9 Keytruda   Review of Systems  Constitutional:  Negative for appetite change, chills, fatigue, fever and unexpected weight change.  HENT:   Positive for hearing loss. Negative for  lump/mass, mouth sores, nosebleeds, sore throat and trouble swallowing.        Deaf left ear  Eyes:  Positive for eye problems. Negative for icterus.       Vision changes:  None  Respiratory:  Negative for chest tightness, cough, hemoptysis and wheezing.        DOE on walking up hill  Cardiovascular:  Positive for leg swelling. Negative for chest pain and palpitations.       PND:  none Orthopnea:  none  Gastrointestinal:  Negative for abdominal distention, abdominal pain, blood in stool, constipation, diarrhea, nausea and vomiting.  Endocrine: Negative for hot flashes.       Cold intolerance:  none Heat intolerance:  none  Genitourinary:  Positive for frequency. Negative for bladder incontinence, difficulty urinating, dysuria and hematuria.        Nocturia x 3 to 4  Musculoskeletal:  Negative for  arthralgias, back pain, gait problem, myalgias, neck pain and neck stiffness.  Skin:  Negative for rash.       Chronic pruritus of arms.  Healing from surgery to left leg  Neurological:  Negative for extremity weakness, gait problem, headaches, numbness and speech difficulty.       Occasional dizziness particularly when bending over.  No falls   Hematological:  Negative for adenopathy. Does not bruise/bleed easily.  Psychiatric/Behavioral:  Negative for sleep disturbance and suicidal ideas. The patient is not nervous/anxious.     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)    Old myocardial infarct    Other amnesia    TIA (transient ischemic attack)     SURGICAL HISTORY: Past Surgical History:  Procedure Laterality Date   CATARACT EXTRACTION Left 08/15/2020   CORONARY  ARTERY BYPASS GRAFT N/A 09/24/2021   Procedure: CORONARY ARTERY BYPASS GRAFTING (CABG) TIMES THREE , ON PUMP, USING LEFT INTERNAL MAMMARY ARTERY AND ENDOSCOPICALLY HARVESTED RIGHT GREATER SAPHENOUS VEIN;  Surgeon: Shyrl Linnie KIDD, MD;  Location: [facility] OR;  Service: Open Heart Surgery;  Laterality: N/A;   ENDOVEIN HARVEST OF GREATER SAPHENOUS VEIN Right 09/24/2021   Procedure: ENDOVEIN HARVEST OF GREATER SAPHENOUS VEIN;  Surgeon: Shyrl Linnie KIDD, MD;  Location: [facility] OR;  Service: Open Heart Surgery;  Laterality: Right;   HEMORRHOID SURGERY     HERNIA REPAIR  11-13-20   LEFT HEART CATH AND CORONARY ANGIOGRAPHY N/A 09/18/2021   Procedure: LEFT HEART CATH AND CORONARY ANGIOGRAPHY;  Surgeon: Claudene Victory ORN, MD;  Location: [facility] INVASIVE CV LAB;  Service: Cardiovascular;  Laterality: N/A;   LOOP RECORDER INSERTION N/A 02/03/2019   Procedure: LOOP RECORDER INSERTION;  Surgeon: Inocencio Soyla Lunger, MD;  Location: [facility] INVASIVE CV LAB;  Service: Cardiovascular;  Laterality: N/A;   MOLE REMOVAL     PORTA CATH INSERTION     SKIN CANCER EXCISION     TEE WITHOUT CARDIOVERSION N/A 02/03/2019   Procedure: TRANSESOPHAGEAL ECHOCARDIOGRAM (TEE);  Surgeon: Jeffrie Oneil BROCKS, MD;  Location: [facility] ENDOSCOPY;  Service: Cardiovascular;  Laterality: N/A;  loop   TEE WITHOUT CARDIOVERSION N/A 09/24/2021   Procedure: TRANSESOPHAGEAL ECHOCARDIOGRAM (TEE);  Surgeon: Shyrl Linnie KIDD, MD;  Location: [facility] OR;  Service: Open Heart Surgery;  Laterality: N/A;    SOCIAL HISTORY: Social History   Socioeconomic History   Marital status: Widowed    Spouse name: Not on file   Number of children: 0   Years of education: Not on file   Highest education level: Not on file  Occupational History   Occupation: Retired Brewing Technologist)  Tobacco Use   Smoking status: Never   Smokeless tobacco: Never  Vaping Use   Vaping status: Never Used  Substance and Sexual Activity   Alcohol use: Never   Drug use: Never   Sexual activity: Not  Currently  Other Topics Concern   Not on file  Social History Narrative   Patient's wife passed away in 2015-11-14 due to cancer - she was unable to have children.  Patient has a sister, his niece and her family lives in his home temporarily    Social Drivers of Health  Financial Resource Strain: Low Risk  (01/16/2022)   Overall Financial Resource Strain (CARDIA)    Difficulty of Paying Living Expenses: Not hard at all  Food Insecurity: No Food Insecurity (04/03/2023)   Hunger Vital Sign    Worried About Running Out of Food in the Last Year: Never true    Ran Out of Food in the Last Year: Never true  Transportation Needs: No Transportation Needs (01/16/2022)   PRAPARE - Administrator, Civil Service (Medical): No    Lack of Transportation (Non-Medical): No  Physical Activity: Not on file  Stress: Not on file  Social Connections: Not on file  Intimate Partner Violence: Not At Risk (04/03/2023)   Humiliation, Afraid, Rape, and Kick questionnaire    Fear of Current or Ex-Partner: No    Emotionally Abused: No    Physically Abused: No    Sexually Abused: No    FAMILY HISTORY Family History  Problem Relation Age of Onset   Stroke Mother    Alzheimer's disease Mother     ALLERGIES:  is allergic to aricept  [donepezil ].  MEDICATIONS:  Current Outpatient Medications  Medication Sig Dispense Refill   clopidogrel  (PLAVIX ) 75 MG tablet Take 1 tablet (75 mg total) by mouth daily. 30 tablet 1   levothyroxine  (SYNTHROID ) 50 MCG tablet Take 1 tablet (50 mcg total) by mouth daily before breakfast. 90 tablet 1   meloxicam (MOBIC) 15 MG tablet Take 15 mg by mouth daily.     metFORMIN  (GLUCOPHAGE ) 1000 MG tablet Take 1,000 mg by mouth 2 (two) times daily.     metoprolol  tartrate (LOPRESSOR ) 25 MG tablet Take 0.5 tablets (12.5 mg total) by mouth 2 (two) times daily. 30 tablet 1   ondansetron  (ZOFRAN ) 8 MG tablet Take 1 tablet (8 mg total) by mouth every 8 (eight) hours as needed for nausea  or vomiting. 30 tablet 1   prochlorperazine  (COMPAZINE ) 10 MG tablet Take 1 tablet (10 mg total) by mouth every 6 (six) hours as needed for nausea or vomiting. 30 tablet 1   rosuvastatin  (CRESTOR ) 20 MG tablet Take 20 mg by mouth daily.     tamsulosin (FLOMAX) 0.4 MG CAPS capsule Take 0.8 mg by mouth daily.     traMADol  (ULTRAM ) 50 MG tablet Take 50 mg by mouth every 6 (six) hours as needed.     acetaminophen  (TYLENOL ) 500 MG tablet Take 500 mg by mouth every 6 (six) hours as needed for mild pain or moderate pain. (Patient not taking: Reported on 06/24/2023)     aspirin  EC 81 MG EC tablet Take 1 tablet (81 mg total) by mouth daily. Swallow whole. (Patient not taking: Reported on 06/24/2023)     No current facility-administered medications for this visit.   Facility-Administered Medications Ordered in Other Visits  Medication Dose Route Frequency Provider Last Rate Last Admin   sodium chloride  flush (NS) 0.9 % injection 10 mL  10 mL Intracatheter PRN Bernie Guillermina BROCKS, MD   10 mL at 10/07/23 1538    PHYSICAL EXAMINATION:  ECOG PERFORMANCE STATUS: 1 - Symptomatic but completely ambulatory   Vitals:   12/12/23 1040  BP: 103/72  Pulse: 84  Resp: 18  SpO2: 99%       Filed Weights   12/12/23 1040  Weight: 184 lb 6.4 oz (83.6 kg)        Physical Exam Vitals and nursing note reviewed.  Constitutional:      Appearance: Normal appearance. He is normal weight. He is  not diaphoretic.     Comments: Here alone  HENT:     Head: Normocephalic and atraumatic.      Comments: 0 = area of injury on face    Right Ear: External ear normal.     Left Ear: External ear normal.     Nose: Nose normal.  Eyes:     Conjunctiva/sclera: Conjunctivae normal.     Pupils: Pupils are equal, round, and reactive to light.  Neck:     Comments: No neck pain on exam.  Normal ROM Cardiovascular:     Rate and Rhythm: Normal rate and regular rhythm.     Heart sounds:     No friction rub. No gallop.   Pulmonary:     Effort: Pulmonary effort is normal. No respiratory distress.     Breath sounds: Normal breath sounds. No stridor.  Abdominal:     General: Abdomen is flat. There is no distension.     Palpations: Abdomen is soft.     Tenderness: There is no abdominal tenderness. There is no guarding.  Musculoskeletal:        General: No swelling. Normal range of motion.     Cervical back: Normal range of motion and neck supple. No rigidity or tenderness.     Right lower leg: No edema.     Left lower leg: No edema.  Lymphadenopathy:     Head:     Right side of head: No submental, submandibular, tonsillar, preauricular, posterior auricular or occipital adenopathy.     Left side of head: No submental, submandibular, tonsillar, preauricular, posterior auricular or occipital adenopathy.     Cervical: No cervical adenopathy.     Right cervical: No superficial, deep or posterior cervical adenopathy.    Left cervical: No superficial, deep or posterior cervical adenopathy.     Upper Body:     Right upper body: No supraclavicular or axillary adenopathy.     Left upper body: No supraclavicular or axillary adenopathy.     Lower Body: No right inguinal adenopathy. No left inguinal adenopathy.  Skin:    Coloration: Skin is not jaundiced.     Findings: No bruising or erythema.     Comments: Fitzpatrick 2 skin tone.  Significant solar damage to face and arms.   Left leg bandaged.    Neurological:     General: No focal deficit present.     Mental Status: He is alert and oriented to person, place, and time.     Comments: Hard of hearing  Psychiatric:        Mood and Affect: Mood normal.        Behavior: Behavior normal.        Thought Content: Thought content normal.        Judgment: Judgment normal.    LABORATORY DATA: I have personally reviewed the data as listed:  Infusion on 12/12/2023  Component Date Value Ref Range Status   WBC Count 12/12/2023 9.5  4.0 - 10.5 K/uL Final   RBC  12/12/2023 5.15  4.22 - 5.81 MIL/uL Final   Hemoglobin 12/12/2023 15.6  13.0 - 17.0 g/dL Final   HCT 98/89/7974 44.4  39.0 - 52.0 % Final   MCV 12/12/2023 86.2  80.0 - 100.0 fL Final   MCH 12/12/2023 30.3  26.0 - 34.0 pg Final   MCHC 12/12/2023 35.1  30.0 - 36.0 g/dL Final   RDW 98/89/7974 13.5  11.5 - 15.5 % Final   Platelet Count 12/12/2023 315  150 -  400 K/uL Final   nRBC 12/12/2023 0.00  0.0 - 0.2 % Final   Neutrophils Relative % 12/12/2023 61  % Final   Neutro Abs 12/12/2023 5.9  1.7 - 7.7 K/uL Final   Lymphocytes Relative 12/12/2023 24  % Final   Lymphs Abs 12/12/2023 2.3  0.7 - 4.0 K/uL Final   Monocytes Relative 12/12/2023 12  % Final   Monocytes Absolute 12/12/2023 1.1 (H)  0.1 - 1.0 K/uL Final   Eosinophils Relative 12/12/2023 2  % Final   Eosinophils Absolute 12/12/2023 0.2  0.0 - 0.5 K/uL Final   Basophils Relative 12/12/2023 0  % Final   Basophils Absolute 12/12/2023 0.0  0.0 - 0.1 K/uL Final   Immature Granulocytes 12/12/2023 1  % Final   Abs Immature Granulocytes 12/12/2023 0.07  0.00 - 0.07 K/uL Final   nRBC 12/12/2023 0  0 /100 WBC Final   Performed at [facility] at [facility], [address] [place], [place], [address]  Infusion on 11/18/2023  Component Date Value Ref Range Status   Sodium 11/18/2023 136  135 - 145 mmol/L Final   Potassium 11/18/2023 4.3  3.5 - 5.1 mmol/L Final   Chloride 11/18/2023 102  98 - 111 mmol/L Final   CO2 11/18/2023 25  22 - 32 mmol/L Final   Glucose, Bld 11/18/2023 273 (H)  70 - 99 mg/dL Final   Glucose reference range applies only to samples taken after fasting for at least 8 hours.   BUN 11/18/2023 12  8 - 23 mg/dL Final   Creatinine 87/82/7975 0.80  0.61 - 1.24 mg/dL Final   Calcium  11/18/2023 9.0  8.9 - 10.3 mg/dL Final   Total Protein 87/82/7975 5.8 (L)  6.5 - 8.1 g/dL Final   Albumin  11/18/2023 3.4 (L)  3.5 - 5.0 g/dL Final   AST 87/82/7975 23  15 - 41 U/L Final   ALT 11/18/2023 9  0 - 44 U/L Final   Alkaline  Phosphatase 11/18/2023 96  38 - 126 U/L Final   Total Bilirubin 11/18/2023 0.3  <1.2 mg/dL Final   GFR, Estimated 11/18/2023 >60  >60 mL/min Final   Comment: (NOTE) Calculated using the CKD-EPI Creatinine Equation (2021)    Anion gap 11/18/2023 9  5 - 15 Final   Performed at [facility] at [facility], [address] [place], [place], [address]   WBC Count 11/18/2023 10.6 (H)  4.0 - 10.5 K/uL Final   RBC 11/18/2023 4.73  4.22 - 5.81 MIL/uL Final   Hemoglobin 11/18/2023 14.1  13.0 - 17.0 g/dL Final   HCT 87/82/7975 40.8  39.0 - 52.0 % Final   MCV 11/18/2023 86.3  80.0 - 100.0 fL Final   MCH 11/18/2023 29.8  26.0 - 34.0 pg Final   MCHC 11/18/2023 34.6  30.0 - 36.0 g/dL Final   RDW 87/82/7975 13.5  11.5 - 15.5 % Final   Platelet Count 11/18/2023 336  150 - 400 K/uL Final   nRBC 11/18/2023 0.00  0.0 - 0.2 % Final   Neutrophils Relative % 11/18/2023 60  % Final   Neutro Abs 11/18/2023 6.4  1.7 - 7.7 K/uL Final   Lymphocytes Relative 11/18/2023 27  % Final   Lymphs Abs 11/18/2023 2.8  0.7 - 4.0 K/uL Final   Monocytes Relative 11/18/2023 9  % Final   Monocytes Absolute 11/18/2023 1.0  0.1 - 1.0 K/uL Final   Eosinophils Relative 11/18/2023 3  % Final   Eosinophils Absolute 11/18/2023 0.3  0.0 - 0.5 K/uL Final   Basophils Relative 11/18/2023 0  % Final   Basophils Absolute 11/18/2023 0.0  0.0 - 0.1 K/uL Final   Immature Granulocytes 11/18/2023 1  % Final   Abs Immature Granulocytes 11/18/2023 0.11 (H)  0.00 - 0.07 K/uL Final   nRBC 11/18/2023 0  0 /100 WBC Final   Performed at [facility] at [facility], [address] [place], [place], [address]   Free T4 11/18/2023 0.64  0.61 - 1.12 ng/dL Final   Comment: (NOTE) Biotin ingestion may interfere with free T4 tests. If the results are inconsistent with the TSH level, previous test results, or the clinical presentation, then consider biotin interference. If needed, order repeat testing after stopping  biotin. Performed at [facility], [address] [place], [place] [address]    TSH 11/18/2023 20.439 (H)  0.350 - 4.500 uIU/mL Final   Comment: Performed by a 3rd Generation assay with a functional sensitivity of <=0.01 uIU/mL. Performed at [facility], [address] [place], [place] [address]     RADIOGRAPHIC STUDIES: I have personally reviewed the radiological images as listed and agree with the findings in the report  No results found.  ASSESSMENT/PLAN 78 y.o. male is here because of  malignant melanoma.  Medical history notable for diabetes mellitus, coronary artery disease, cerebrovascular disease, MI, cataracts  Malignant melanoma, distal left leg, Stage IIC (T4b N0 M0)- Risk factors for this patient are 1) chronic exposure to UV 2) Sunburns 3) Fitzpatrick Grade 2 skin tone March 19, 2023: Shave biopsy left lateral leg demonstrated malignant melanoma, nodular type. Breslow depth 5.1 mm. Clark's level 4. Ulceration present. Mitotic index 16/mm. Pathologic stage T4b. Deep margin involved  April 01 2023- Wide local excision.  Sentinel LN bx not performed due to patient frailty  Therapeutics   Apr 03 2023- Will obtain CBC with diff, CMP, LDH, MRI brain and whole body PET/CT for staging  Patient meets criteria per NCCN Guidelines for adjuvant immunotherapy and will plan on adjuvant Pembrolizumab   May 06 2023- Chemotherapy teaching  May 13 2023:  Cycle 1 Pembrolizumab   June 03 2023:  Cycle 2 Pembrolizumab   June 24 2023:  TSH 0.31 Free T4 11.3-- Will follow as this may be due to thyroiditis from Pembrolizumab .   Cycle 3 Pembrolizumab   July 31 2923- Pembrolizumab  currently on hold due to recent bout of mental status changes. Rechecked thyroid  studies- TSH 0.81 F4 1.11 Thyroglobulin Ab negative.  Stopped  methimidazole as he does not have thyrotoxicosis.  August 15 2023- Appears to be back at baseline.  Resume Keytruda . TSH 4.596 FT4 0.60   August 18 2023:  Cycle 4 Keytruda  September 08 2023-  TSH 14.92.  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  TSH/T4 24.622/3.5.  Not likely taking Synthroid  regularly October 28 2023- Cycle 7 Keytruda  November 18 2023- Cycle 8 Keytruda .  Awaiting TSH improved at 20.4  which indicates that he is taking synthroid  regularly.   December 12 2023- Cycle 9 Keytruda .  TSH continues to improve and is now 18.7     Immunotherapy Risks:   May 06 2023- Reiterated potential side effects of immunotherapy with the patient.  These include but are not limited to:  Fatigue, Hair loss, low blood counts (anemia, thrombocytopenia) that may necessitate transfusion, bleeding, infection, nausea/ vomiting/Appetite changes/ Constipation/ Diarrhea, mucositis, Neuropathy/ neurologic problems,  skin and nail changes such as dry skin and color change, Urine and bladder changes and kidney problems, weight changes, mood changes, decreased libido/fertility problems, damage to heart and lungs.  Some of these side effects can be life threatening, may be permanent and can result in hospitalization and/or death.  In shared decision making patient has agreed to proceed with immunotherapy.    Poor IV access  Apr 29 2023- Underwent portacath placement  Urinary frequency  May 06 2023- Likely secondary to BPH.  PSA 7.11 and began Proscar .    June 24 2023- Urinary frequency improved.  May be due to BPH with contribution of hyperglycemia  August 15 2023- Has not yet seen urology.  Will request again.    November 18 2023- Obtain cystoscopy records.    Elevated PSA  May 06 2023- PSA 7.11  June 24 2023- Referred to urology for evaluation  Frequent falls  September 08 2023- Likely related to small vessel cerebrovascular disease, gait problems.  No evidence of serious injury from today's event.  Will follow as patient is frail  elderly       Cancer Staging  Malignant melanoma (HCC) Staging form: Melanoma of the Skin, AJCC 8th Edition - Clinical stage from 04/03/2023: Stage IIC (cT4b, cN0, cM0) - Signed by Bernie Guillermina BROCKS, MD on 04/03/2023 Histopathologic type: Nodular melanoma Stage prefix: Initial diagnosis    No problem-specific Assessment & Plan notes found for this encounter.    No orders of the defined types were placed in this encounter.   30 minutes was spent in patient care.  This included time spent preparing to see the patient (e.g., review of tests), obtaining and/or reviewing separately obtained history, counseling and educating the patient/family/caregiver, ordering medications, tests, or procedures; documenting clinical information in the electronic or other health record, independently interpreting results and communicating results to the patient/family/caregiver as well as coordination of care.       All questions were answered. The patient knows to call the clinic with any problems, questions or concerns.  This note was electronically signed.    Guillermina BROCKS Bernie, MD  12/12/2023 10:41 AM

## 2023-12-12 NOTE — Patient Instructions (Signed)
 [facility] [place] - A DEPT OF [facility]  Discharge Instructions: Thank you for choosing [facility] to provide your oncology and hematology care.  If you have a lab appointment with the Cancer Center, please go directly to the Cancer Center and check in at the registration area.   Wear comfortable clothing and clothing appropriate for easy access to any Portacath or PICC line.   We strive to give you quality time with your provider. You may need to reschedule your appointment if you arrive late (15 or more minutes).  Arriving late affects you and other patients whose appointments are after yours.  Also, if you miss three or more appointments without notifying the office, you may be dismissed from the clinic at the provider's discretion.      For prescription refill requests, have your pharmacy contact our office and allow 72 hours for refills to be completed.    Today you received the following chemotherapy and/or immunotherapy agents Keytruda        To help prevent nausea and vomiting after your treatment, we encourage you to take your nausea medication as directed.  BELOW ARE SYMPTOMS THAT SHOULD BE REPORTED IMMEDIATELY: *FEVER GREATER THAN 100.4 F (38 C) OR HIGHER *CHILLS OR SWEATING *NAUSEA AND VOMITING THAT IS NOT CONTROLLED WITH YOUR NAUSEA MEDICATION *UNUSUAL SHORTNESS OF BREATH *UNUSUAL BRUISING OR BLEEDING *URINARY PROBLEMS (pain or burning when urinating, or frequent urination) *BOWEL PROBLEMS (unusual diarrhea, constipation, pain near the anus) TENDERNESS IN MOUTH AND THROAT WITH OR WITHOUT PRESENCE OF ULCERS (sore throat, sores in mouth, or a toothache) UNUSUAL RASH, SWELLING OR PAIN  UNUSUAL VAGINAL DISCHARGE OR ITCHING   Items with * indicate a potential emergency and should be followed up as soon as possible or go to the Emergency Department if any problems should occur.  Please show the CHEMOTHERAPY ALERT CARD or IMMUNOTHERAPY ALERT  CARD at check-in to the Emergency Department and triage nurse.  Should you have questions after your visit or need to cancel or reschedule your appointment, please contact [facility] [place] - [facility]  Dept: [phone]  and follow the prompts.  Office hours are 8:00 a.m. to 4:30 p.m. Monday - Friday. Please note that voicemails left after 4:00 p.m. may not be returned until the following business day.  We are closed weekends and major holidays. You have access to a nurse at all times for urgent questions. Please call the main number to the clinic Dept: [phone] and follow the prompts.  For any non-urgent questions, you may also contact your provider using MyChart. We now offer e-Visits for anyone 65 and older to request care online for non-urgent symptoms. For details visit [email or web address].   Also download the MyChart app! Go to the app store, search MyChart, open the app, select [place], and log in with your MyChart username and password.

## 2023-12-13 ENCOUNTER — Other Ambulatory Visit: Payer: Self-pay

## 2023-12-13 LAB — T4: T4, Total: 4.1 ug/dL — ABNORMAL LOW (ref 4.5–12.0)

## 2023-12-15 ENCOUNTER — Encounter: Payer: Self-pay | Admitting: Oncology

## 2023-12-15 DIAGNOSIS — C61 Malignant neoplasm of prostate: Secondary | ICD-10-CM | POA: Diagnosis not present

## 2023-12-15 LAB — TSH: TSH: 18.668 u[IU]/mL — ABNORMAL HIGH (ref 0.350–4.500)

## 2023-12-25 ENCOUNTER — Other Ambulatory Visit: Payer: Self-pay

## 2023-12-25 ENCOUNTER — Ambulatory Visit: Payer: Medicare HMO | Admitting: Neurology

## 2023-12-25 DIAGNOSIS — I2510 Atherosclerotic heart disease of native coronary artery without angina pectoris: Secondary | ICD-10-CM | POA: Diagnosis not present

## 2023-12-25 DIAGNOSIS — C4372 Malignant melanoma of left lower limb, including hip: Secondary | ICD-10-CM | POA: Diagnosis not present

## 2023-12-25 DIAGNOSIS — I252 Old myocardial infarction: Secondary | ICD-10-CM | POA: Diagnosis not present

## 2023-12-25 DIAGNOSIS — I6523 Occlusion and stenosis of bilateral carotid arteries: Secondary | ICD-10-CM | POA: Diagnosis not present

## 2023-12-25 DIAGNOSIS — E034 Atrophy of thyroid (acquired): Secondary | ICD-10-CM | POA: Diagnosis not present

## 2023-12-25 DIAGNOSIS — Z6827 Body mass index (BMI) 27.0-27.9, adult: Secondary | ICD-10-CM | POA: Diagnosis not present

## 2023-12-25 DIAGNOSIS — E1159 Type 2 diabetes mellitus with other circulatory complications: Secondary | ICD-10-CM | POA: Diagnosis not present

## 2023-12-25 DIAGNOSIS — I6990 Unspecified sequelae of unspecified cerebrovascular disease: Secondary | ICD-10-CM | POA: Diagnosis not present

## 2023-12-25 DIAGNOSIS — E782 Mixed hyperlipidemia: Secondary | ICD-10-CM | POA: Diagnosis not present

## 2024-01-01 DIAGNOSIS — C61 Malignant neoplasm of prostate: Secondary | ICD-10-CM | POA: Diagnosis not present

## 2024-01-01 DIAGNOSIS — R351 Nocturia: Secondary | ICD-10-CM | POA: Diagnosis not present

## 2024-01-01 DIAGNOSIS — R972 Elevated prostate specific antigen [PSA]: Secondary | ICD-10-CM | POA: Diagnosis not present

## 2024-01-01 NOTE — Progress Notes (Deleted)
 [facility]  [address] [place],  [place]  [address] [phone]  Clinic Day:  01/01/2024  Referring physician: Gus Height, PA   HISTORY OF PRESENT ILLNESS:  The patient is a 78 y.o. male with stage IIC (T4b N0 M0) melanoma, status post a wide local excision of his left lateral leg in late April 2024.  He comes in today to be evaluated before heading into his 10 cycle of maintenance pembrolizumab.      PHYSICAL EXAM:  There were no vitals taken for this visit. Wt Readings from Last 3 Encounters:  12/12/23 184 lb 6.4 oz (83.6 kg)  11/18/23 183 lb 1.6 oz (83.1 kg)  10/28/23 183 lb 11.2 oz (83.3 kg)   There is no height or weight on file to calculate BMI. Performance status (ECOG): {CHL ONC [id number] Physical Exam  LABS:      Latest Ref Rng & Units 12/12/2023   10:11 AM 11/18/2023   12:46 PM 10/28/2023    1:38 PM  CBC  WBC 4.0 - 10.5 K/uL 9.5  10.6  9.8   Hemoglobin 13.0 - 17.0 g/dL 91.4  78.2  95.6   Hematocrit 39.0 - 52.0 % 44.4  40.8  41.1   Platelets 150 - 400 K/uL 315  336  322       Latest Ref Rng & Units 12/12/2023   10:11 AM 11/18/2023   12:46 PM 10/28/2023    1:38 PM  CMP  Glucose 70 - 99 mg/dL 213  086  578   BUN 8 - 23 mg/dL 13  12  15    Creatinine 0.61 - 1.24 mg/dL 4.69  6.29  5.28   Sodium 135 - 145 mmol/L 137  136  136   Potassium 3.5 - 5.1 mmol/L 4.1  4.3  4.2   Chloride 98 - 111 mmol/L 101  102  102   CO2 22 - 32 mmol/L 26  25  23    Calcium 8.9 - 10.3 mg/dL 9.4  9.0  9.1   Total Protein 6.5 - 8.1 g/dL 6.5  5.8  6.0   Total Bilirubin 0.0 - 1.2 mg/dL 0.5  0.3  0.4   Alkaline Phos 38 - 126 U/L 105  96  97   AST 15 - 41 U/L 16  23  14    ALT 0 - 44 U/L 11  9  7       No results found for: "CEA1", "CEA" / No results found for: "CEA1", "CEA" Lab Results  Component Value Date   PSA1 4.7 (H) 06/28/2020   No results found for: "UXL244" No results found for: "CAN125"  No results found for:  "TOTALPROTELP", "ALBUMINELP", "A1GS", "A2GS", "BETS", "BETA2SER", "GAMS", "MSPIKE", "SPEI" No results found for: "TIBC", "FERRITIN", "IRONPCTSAT" Lab Results  Component Value Date   LDH 129 04/03/2023       Component Value Date/Time   LDH 129 04/03/2023 0938   PSA1 4.7 (H) 06/28/2020 0942   THGAB <1.0 07/31/2023 1114    Review Flowsheet  More data may exist      Latest Ref Rng & Units 06/28/2020 04/03/2023 07/31/2023  Oncology Labs  LDH 98 - 192 U/L - 129  -  Prostate Specific Ag, Serum 0.0 - 4.0 ng/mL 4.7  - -  Thyroglobulin Antibody 0.0 - 0.9 IU/mL - - <1.0      STUDIES:  No results found.    ASSESSMENT & PLAN:   Assessment/Plan:  A 78 y.o. male with *** .  The patient understands all the plans discussed today and is in agreement with them.      Stephannie Broner Kirby Funk, MD

## 2024-01-02 ENCOUNTER — Inpatient Hospital Stay: Payer: Medicare HMO

## 2024-01-02 ENCOUNTER — Inpatient Hospital Stay: Payer: Medicare HMO | Admitting: Oncology

## 2024-01-04 ENCOUNTER — Encounter: Payer: Self-pay | Admitting: Oncology

## 2024-01-19 ENCOUNTER — Encounter: Payer: Self-pay | Admitting: Oncology

## 2024-01-19 ENCOUNTER — Other Ambulatory Visit: Payer: Self-pay | Admitting: Pharmacist

## 2024-01-19 DIAGNOSIS — C4372 Malignant melanoma of left lower limb, including hip: Secondary | ICD-10-CM

## 2024-01-22 NOTE — Progress Notes (Deleted)
 [facility]  [address] [place],  [place]  [address] [phone]  Clinic Day:  01/22/2024  Referring physician: Gus Height, PA   HISTORY OF PRESENT ILLNESS:  The patient is a 78 y.o. male with stage IIC (T4b N0 M0) melanoma, status post a wide local excision of his left lateral leg in late April 2024.  He comes in today to be evaluated before heading into his 10 cycle of maintenance pembrolizumab.   Of note, his 10th cycle was delayed over these past few weeks due to    PHYSICAL EXAM:  There were no vitals taken for this visit. Wt Readings from Last 3 Encounters:  12/12/23 184 lb 6.4 oz (83.6 kg)  11/18/23 183 lb 1.6 oz (83.1 kg)  10/28/23 183 lb 11.2 oz (83.3 kg)   There is no height or weight on file to calculate BMI. Performance status (ECOG): {CHL ONC [id number] Physical Exam  LABS:      Latest Ref Rng & Units 12/12/2023   10:11 AM 11/18/2023   12:46 PM 10/28/2023    1:38 PM  CBC  WBC 4.0 - 10.5 K/uL 9.5  10.6  9.8   Hemoglobin 13.0 - 17.0 g/dL 91.4  78.2  95.6   Hematocrit 39.0 - 52.0 % 44.4  40.8  41.1   Platelets 150 - 400 K/uL 315  336  322       Latest Ref Rng & Units 12/12/2023   10:11 AM 11/18/2023   12:46 PM 10/28/2023    1:38 PM  CMP  Glucose 70 - 99 mg/dL 213  086  578   BUN 8 - 23 mg/dL 13  12  15    Creatinine 0.61 - 1.24 mg/dL 4.69  6.29  5.28   Sodium 135 - 145 mmol/L 137  136  136   Potassium 3.5 - 5.1 mmol/L 4.1  4.3  4.2   Chloride 98 - 111 mmol/L 101  102  102   CO2 22 - 32 mmol/L 26  25  23    Calcium 8.9 - 10.3 mg/dL 9.4  9.0  9.1   Total Protein 6.5 - 8.1 g/dL 6.5  5.8  6.0   Total Bilirubin 0.0 - 1.2 mg/dL 0.5  0.3  0.4   Alkaline Phos 38 - 126 U/L 105  96  97   AST 15 - 41 U/L 16  23  14    ALT 0 - 44 U/L 11  9  7       No results found for: "CEA1", "CEA" / No results found for: "CEA1", "CEA" Lab Results  Component Value Date   PSA1 4.7 (H) 06/28/2020   No results found for:  "UXL244" No results found for: "CAN125"  No results found for: "TOTALPROTELP", "ALBUMINELP", "A1GS", "A2GS", "BETS", "BETA2SER", "GAMS", "MSPIKE", "SPEI" No results found for: "TIBC", "FERRITIN", "IRONPCTSAT" Lab Results  Component Value Date   LDH 129 04/03/2023       Component Value Date/Time   LDH 129 04/03/2023 0938   PSA1 4.7 (H) 06/28/2020 0942   THGAB <1.0 07/31/2023 1114    Review Flowsheet  More data may exist      Latest Ref Rng & Units 06/28/2020 04/03/2023 07/31/2023  Oncology Labs  LDH 98 - 192 U/L - 129  -  Prostate Specific Ag, Serum 0.0 - 4.0 ng/mL 4.7  - -  Thyroglobulin Antibody 0.0 - 0.9 IU/mL - - <1.0      STUDIES:  No results found.  ASSESSMENT & PLAN:   Assessment/Plan:  A 78 y.o. male with *** .The patient understands all the plans discussed today and is in agreement with them.      Tikia Skilton Kirby Funk, MD

## 2024-01-23 ENCOUNTER — Inpatient Hospital Stay: Payer: Medicare HMO

## 2024-01-23 ENCOUNTER — Inpatient Hospital Stay: Payer: Medicare HMO | Attending: Oncology | Admitting: Oncology

## 2024-01-26 NOTE — Progress Notes (Unsigned)
 [facility]  [address] [place],  [place]  [address] [phone]  Clinic Day:  01/27/2024  Referring physician: Gus Height, PA   HISTORY OF PRESENT ILLNESS:  The patient is a 78 y.o. male with stage IIC (T4b N0 M0) melanoma, status post a wide local excision of his left lateral leg in late April 2024.  He comes in today to be evaluated before heading into his 10 cycle of maintenance pembrolizumab.   Overall, the patient claims to be doing well.  He continues to tolerate his immunotherapy without having any side effects.  He denies having any new skin lesions or lymphadenopathy which concerns him for recurrent melanoma.  PHYSICAL EXAM:  Blood pressure 123/74, pulse 76, temperature 99 F (37.2 C), temperature source Oral, resp. rate 18, height 5\' 9"  (1.753 m), weight 182 lb 8 oz (82.8 kg), SpO2 99%. Wt Readings from Last 3 Encounters:  01/27/24 182 lb 8 oz (82.8 kg)  12/12/23 184 lb 6.4 oz (83.6 kg)  11/18/23 183 lb 1.6 oz (83.1 kg)   Body mass index is 26.95 kg/m. Performance status (ECOG): 1 - Symptomatic but completely ambulatory Physical Exam Constitutional:      Appearance: Normal appearance. He is not ill-appearing.  HENT:     Mouth/Throat:     Mouth: Mucous membranes are moist.     Pharynx: Oropharynx is clear. No oropharyngeal exudate or posterior oropharyngeal erythema.  Cardiovascular:     Rate and Rhythm: Normal rate and regular rhythm.     Heart sounds: No murmur heard.    No friction rub. No gallop.  Pulmonary:     Effort: Pulmonary effort is normal. No respiratory distress.     Breath sounds: Normal breath sounds. No wheezing, rhonchi or rales.  Abdominal:     General: Bowel sounds are normal. There is no distension.     Palpations: Abdomen is soft. There is no mass.     Tenderness: There is no abdominal tenderness.  Musculoskeletal:        General: No swelling.     Right lower leg: No edema.     Left lower leg: No  edema.  Lymphadenopathy:     Cervical: No cervical adenopathy.     Upper Body:     Right upper body: No supraclavicular or axillary adenopathy.     Left upper body: No supraclavicular or axillary adenopathy.     Lower Body: No right inguinal adenopathy. No left inguinal adenopathy.  Skin:    General: Skin is warm.     Coloration: Skin is not jaundiced.     Findings: No lesion or rash.  Neurological:     General: No focal deficit present.     Mental Status: He is alert and oriented to person, place, and time. Mental status is at baseline.  Psychiatric:        Mood and Affect: Mood normal.        Behavior: Behavior normal.        Thought Content: Thought content normal.     LABS:      Latest Ref Rng & Units 01/27/2024   10:45 AM 12/12/2023   10:11 AM 11/18/2023   12:46 PM  CBC  WBC 4.0 - 10.5 K/uL 7.7  9.5  10.6   Hemoglobin 13.0 - 17.0 g/dL 38.7  56.4  33.2   Hematocrit 39.0 - 52.0 % 41.4  44.4  40.8   Platelets 150 - 400 K/uL 261  315  336       Latest Ref Rng & Units 01/27/2024   10:45 AM 12/12/2023   10:11 AM 11/18/2023   12:46 PM  CMP  Glucose 70 - 99 mg/dL 086  578  469   BUN 8 - 23 mg/dL 13  13  12    Creatinine 0.61 - 1.24 mg/dL 6.29  5.28  4.13   Sodium 135 - 145 mmol/L 137  137  136   Potassium 3.5 - 5.1 mmol/L 4.2  4.1  4.3   Chloride 98 - 111 mmol/L 102  101  102   CO2 22 - 32 mmol/L 24  26  25    Calcium 8.9 - 10.3 mg/dL 8.5  9.4  9.0   Total Protein 6.5 - 8.1 g/dL 6.0  6.5  5.8   Total Bilirubin 0.0 - 1.2 mg/dL 0.5  0.5  0.3   Alkaline Phos 38 - 126 U/L 88  105  96   AST 15 - 41 U/L 13  16  23    ALT 0 - 44 U/L 8  11  9      ASSESSMENT & PLAN:  Assessment/Plan:  A 78 y.o. male with stage IIC (T4b N0 M0) melanoma, status post a wide local excision of his left lateral leg in late April 2024.  He will proceed with his 10th cycle of adjuvant pembrolizumab today.  Clinically, the patient appears to be doing well.  I will see him back in 3 weeks before he heads  into his 11th cycle of pembrolizumab.  The patient understands all the plans discussed today and is in agreement with them.    Akaiya Touchette Kirby Funk, MD

## 2024-01-27 ENCOUNTER — Encounter: Payer: Self-pay | Admitting: Oncology

## 2024-01-27 ENCOUNTER — Inpatient Hospital Stay: Payer: Medicare HMO | Attending: Oncology | Admitting: Oncology

## 2024-01-27 ENCOUNTER — Inpatient Hospital Stay: Payer: Medicare HMO

## 2024-01-27 VITALS — BP 123/74 | HR 76 | Temp 99.0°F | Resp 18 | Ht 69.0 in | Wt 182.5 lb

## 2024-01-27 DIAGNOSIS — Z79899 Other long term (current) drug therapy: Secondary | ICD-10-CM | POA: Insufficient documentation

## 2024-01-27 DIAGNOSIS — Z5111 Encounter for antineoplastic chemotherapy: Secondary | ICD-10-CM | POA: Insufficient documentation

## 2024-01-27 DIAGNOSIS — C4372 Malignant melanoma of left lower limb, including hip: Secondary | ICD-10-CM

## 2024-01-27 LAB — CBC WITH DIFFERENTIAL (CANCER CENTER ONLY)
Abs Immature Granulocytes: 0.03 10*3/uL (ref 0.00–0.07)
Basophils Absolute: 0 10*3/uL (ref 0.0–0.1)
Basophils Relative: 1 %
Eosinophils Absolute: 0.3 10*3/uL (ref 0.0–0.5)
Eosinophils Relative: 4 %
HCT: 41.4 % (ref 39.0–52.0)
Hemoglobin: 14.5 g/dL (ref 13.0–17.0)
Immature Granulocytes: 0 %
Lymphocytes Relative: 31 %
Lymphs Abs: 2.4 10*3/uL (ref 0.7–4.0)
MCH: 30.6 pg (ref 26.0–34.0)
MCHC: 35 g/dL (ref 30.0–36.0)
MCV: 87.3 fL (ref 80.0–100.0)
Monocytes Absolute: 0.9 10*3/uL (ref 0.1–1.0)
Monocytes Relative: 12 %
Neutro Abs: 4 10*3/uL (ref 1.7–7.7)
Neutrophils Relative %: 52 %
Platelet Count: 261 10*3/uL (ref 150–400)
RBC: 4.74 MIL/uL (ref 4.22–5.81)
RDW: 13.2 % (ref 11.5–15.5)
WBC Count: 7.7 10*3/uL (ref 4.0–10.5)
nRBC: 0 % (ref 0.0–0.2)
nRBC: 0 /100{WBCs}

## 2024-01-27 LAB — CMP (CANCER CENTER ONLY)
ALT: 8 U/L (ref 0–44)
AST: 13 U/L — ABNORMAL LOW (ref 15–41)
Albumin: 3.6 g/dL (ref 3.5–5.0)
Alkaline Phosphatase: 88 U/L (ref 38–126)
Anion gap: 11 (ref 5–15)
BUN: 13 mg/dL (ref 8–23)
CO2: 24 mmol/L (ref 22–32)
Calcium: 8.5 mg/dL — ABNORMAL LOW (ref 8.9–10.3)
Chloride: 102 mmol/L (ref 98–111)
Creatinine: 0.72 mg/dL (ref 0.61–1.24)
GFR, Estimated: >60 mL/min (ref >60)
Glucose, Bld: 282 mg/dL — ABNORMAL HIGH (ref 70–99)
Potassium: 4.2 mmol/L (ref 3.5–5.1)
Sodium: 137 mmol/L (ref 135–145)
Total Bilirubin: 0.5 mg/dL (ref 0.0–1.2)
Total Protein: 6 g/dL — ABNORMAL LOW (ref 6.5–8.1)

## 2024-01-27 MED ORDER — SODIUM CHLORIDE 0.9 % IV SOLN: Freq: Once | INTRAVENOUS | Status: AC

## 2024-01-27 MED ORDER — HEPARIN SOD (PORK) LOCK FLUSH 100 UNIT/ML IV SOLN
500.0000 [IU] | Freq: Once | INTRAVENOUS | Status: AC | PRN
  Administered 2024-01-27: 500 [IU]

## 2024-01-27 MED ORDER — SODIUM CHLORIDE 0.9 % IV SOLN
200.0000 mg | Freq: Once | INTRAVENOUS | Status: AC
  Administered 2024-01-27: 200 mg via INTRAVENOUS
  Filled 2024-01-27: qty 8

## 2024-01-27 MED ORDER — SODIUM CHLORIDE 0.9% FLUSH
10.0000 mL | INTRAVENOUS | Status: AC | PRN
  Administered 2024-01-27: 10 mL

## 2024-01-27 NOTE — Addendum Note (Signed)
 Addended by: Benjaman Lobe E on: 01/27/2024 12:08 PM   Modules accepted: Orders

## 2024-01-27 NOTE — Patient Instructions (Signed)

## 2024-02-16 NOTE — Progress Notes (Unsigned)
 [facility]  [address] [place],  [place]  [address] [phone]  Clinic Day:  02/17/2024  Referring physician: Gus Height, PA   HISTORY OF PRESENT ILLNESS:  The patient is a 78 y.o. male with stage IIC (T4b N0 M0) melanoma, status post a wide local excision of his left lateral leg in late April 2024.  He comes in today to be evaluated before heading into his 11th cycle of maintenance pembrolizumab.   Overall, the patient claims to be doing well.  He continues to tolerate his immunotherapy without having any side effects.  He denies having any new skin lesions or lymphadenopathy which concerns him for recurrent melanoma.  PHYSICAL EXAM:  Blood pressure 136/83, pulse 80, temperature 97.7 F (36.5 C), temperature source Oral, resp. rate 16, height 5\' 9"  (1.753 m), weight 184 lb (83.5 kg), SpO2 99%. Wt Readings from Last 3 Encounters:  02/17/24 184 lb (83.5 kg)  01/27/24 182 lb 8 oz (82.8 kg)  12/12/23 184 lb 6.4 oz (83.6 kg)   Body mass index is 27.17 kg/m. Performance status (ECOG): 1 - Symptomatic but completely ambulatory Physical Exam Constitutional:      Appearance: Normal appearance. He is not ill-appearing.  HENT:     Mouth/Throat:     Mouth: Mucous membranes are moist.     Pharynx: Oropharynx is clear. No oropharyngeal exudate or posterior oropharyngeal erythema.  Cardiovascular:     Rate and Rhythm: Normal rate and regular rhythm.     Heart sounds: No murmur heard.    No friction rub. No gallop.  Pulmonary:     Effort: Pulmonary effort is normal. No respiratory distress.     Breath sounds: Normal breath sounds. No wheezing, rhonchi or rales.  Abdominal:     General: Bowel sounds are normal. There is no distension.     Palpations: Abdomen is soft. There is no mass.     Tenderness: There is no abdominal tenderness.  Musculoskeletal:        General: No swelling.     Right lower leg: No edema.     Left lower leg: No edema.   Lymphadenopathy:     Cervical: No cervical adenopathy.     Upper Body:     Right upper body: No supraclavicular or axillary adenopathy.     Left upper body: No supraclavicular or axillary adenopathy.     Lower Body: No right inguinal adenopathy. No left inguinal adenopathy.  Skin:    General: Skin is warm.     Coloration: Skin is not jaundiced.     Findings: No lesion or rash.  Neurological:     General: No focal deficit present.     Mental Status: He is alert and oriented to person, place, and time. Mental status is at baseline.  Psychiatric:        Mood and Affect: Mood normal.        Behavior: Behavior normal.        Thought Content: Thought content normal.    LABS:      Latest Ref Rng & Units 02/17/2024    9:31 AM 01/27/2024   10:45 AM 12/12/2023   10:11 AM  CBC  WBC 4.0 - 10.5 K/uL 8.9  7.7  9.5   Hemoglobin 13.0 - 17.0 g/dL 91.4  78.2  95.6   Hematocrit 39.0 - 52.0 % 42.1  41.4  44.4   Platelets 150 - 400 K/uL 263  261  315  Latest Ref Rng & Units 02/17/2024    9:31 AM 01/27/2024   10:45 AM 12/12/2023   10:11 AM  CMP  Glucose 70 - 99 mg/dL 161  096  045   BUN 8 - 23 mg/dL 13  13  13    Creatinine 0.61 - 1.24 mg/dL 4.09  8.11  9.14   Sodium 135 - 145 mmol/L 137  137  137   Potassium 3.5 - 5.1 mmol/L 4.0  4.2  4.1   Chloride 98 - 111 mmol/L 102  102  101   CO2 22 - 32 mmol/L 24  24  26    Calcium 8.9 - 10.3 mg/dL 8.8  8.5  9.4   Total Protein 6.5 - 8.1 g/dL 6.0  6.0  6.5   Total Bilirubin 0.0 - 1.2 mg/dL 0.6  0.5  0.5   Alkaline Phos 38 - 126 U/L 100  88  105   AST 15 - 41 U/L 18  13  16    ALT 0 - 44 U/L 13  8  11      ASSESSMENT & PLAN:  Assessment/Plan:  A 78 y.o. male with stage IIC (T4b N0 M0) melanoma, status post a wide local excision of his left lateral leg in late April 2024.  He will proceed with his 11th cycle of adjuvant pembrolizumab today.  Clinically, the patient appears to be doing well.  I will see him back in 3 weeks before he heads into his  12th cycle of pembrolizumab.  The patient understands all the plans discussed today and is in agreement with them.    Curley Hogen Kirby Funk, MD

## 2024-02-17 ENCOUNTER — Inpatient Hospital Stay: Payer: Medicare HMO | Attending: Oncology | Admitting: Oncology

## 2024-02-17 ENCOUNTER — Inpatient Hospital Stay: Payer: Medicare HMO

## 2024-02-17 VITALS — BP 136/83 | HR 80 | Temp 97.7°F | Resp 16 | Ht 69.0 in | Wt 184.0 lb

## 2024-02-17 DIAGNOSIS — C4372 Malignant melanoma of left lower limb, including hip: Secondary | ICD-10-CM | POA: Insufficient documentation

## 2024-02-17 DIAGNOSIS — Z79899 Other long term (current) drug therapy: Secondary | ICD-10-CM | POA: Insufficient documentation

## 2024-02-17 DIAGNOSIS — Z5111 Encounter for antineoplastic chemotherapy: Secondary | ICD-10-CM | POA: Insufficient documentation

## 2024-02-17 LAB — CBC WITH DIFFERENTIAL (CANCER CENTER ONLY)
Abs Immature Granulocytes: 0.09 10*3/uL — ABNORMAL HIGH (ref 0.00–0.07)
Basophils Absolute: 0 10*3/uL (ref 0.0–0.1)
Basophils Relative: 0 %
Eosinophils Absolute: 0.3 10*3/uL (ref 0.0–0.5)
Eosinophils Relative: 4 %
HCT: 42.1 % (ref 39.0–52.0)
Hemoglobin: 14.7 g/dL (ref 13.0–17.0)
Immature Granulocytes: 1 %
Lymphocytes Relative: 25 %
Lymphs Abs: 2.2 10*3/uL (ref 0.7–4.0)
MCH: 30.2 pg (ref 26.0–34.0)
MCHC: 34.9 g/dL (ref 30.0–36.0)
MCV: 86.6 fL (ref 80.0–100.0)
Monocytes Absolute: 0.8 10*3/uL (ref 0.1–1.0)
Monocytes Relative: 9 %
Neutro Abs: 5.4 10*3/uL (ref 1.7–7.7)
Neutrophils Relative %: 61 %
Platelet Count: 263 10*3/uL (ref 150–400)
RBC: 4.86 MIL/uL (ref 4.22–5.81)
RDW: 13.1 % (ref 11.5–15.5)
WBC Count: 8.9 10*3/uL (ref 4.0–10.5)
nRBC: 0 % (ref 0.0–0.2)
nRBC: 0 /100{WBCs}

## 2024-02-17 LAB — CMP (CANCER CENTER ONLY)
ALT: 13 U/L (ref 0–44)
AST: 18 U/L (ref 15–41)
Albumin: 3.9 g/dL (ref 3.5–5.0)
Alkaline Phosphatase: 100 U/L (ref 38–126)
Anion gap: 11 (ref 5–15)
BUN: 13 mg/dL (ref 8–23)
CO2: 24 mmol/L (ref 22–32)
Calcium: 8.8 mg/dL — ABNORMAL LOW (ref 8.9–10.3)
Chloride: 102 mmol/L (ref 98–111)
Creatinine: 0.69 mg/dL (ref 0.61–1.24)
GFR, Estimated: >60 mL/min (ref >60)
Glucose, Bld: 274 mg/dL — ABNORMAL HIGH (ref 70–99)
Potassium: 4 mmol/L (ref 3.5–5.1)
Sodium: 137 mmol/L (ref 135–145)
Total Bilirubin: 0.6 mg/dL (ref 0.0–1.2)
Total Protein: 6 g/dL — ABNORMAL LOW (ref 6.5–8.1)

## 2024-02-17 MED ORDER — SODIUM CHLORIDE 0.9 % IV SOLN
200.0000 mg | Freq: Once | INTRAVENOUS | Status: AC
  Administered 2024-02-17: 200 mg via INTRAVENOUS
  Filled 2024-02-17: qty 8

## 2024-02-17 MED ORDER — SODIUM CHLORIDE 0.9 % IV SOLN: Freq: Once | INTRAVENOUS | Status: AC

## 2024-02-17 MED ORDER — HEPARIN SOD (PORK) LOCK FLUSH 100 UNIT/ML IV SOLN
500.0000 [IU] | Freq: Once | INTRAVENOUS | Status: AC | PRN
Start: 2024-02-17 — End: 2024-02-17
  Administered 2024-02-17: 500 [IU]

## 2024-02-17 MED ORDER — SODIUM CHLORIDE 0.9% FLUSH
10.0000 mL | INTRAVENOUS | Status: DC | PRN
  Administered 2024-02-17: 10 mL

## 2024-02-17 NOTE — Patient Instructions (Signed)
 [facility] [place] [facility]  Discharge Instructions: Thank you for choosing [facility] to provide your oncology and hematology care.  If you have a lab appointment with the Cancer Center, please go directly to the Cancer Center and check in at the registration area.   Wear comfortable clothing and clothing appropriate for easy access to any Portacath or PICC line.   We strive to give you quality time with your provider. You may need to reschedule your appointment if you arrive late (15 or more minutes).  Arriving late affects you and other patients whose appointments are after yours.  Also, if you miss three or more appointments without notifying the office, you may be dismissed from the clinic at the provider's discretion.      For prescription refill requests, have your pharmacy contact our office and allow 72 hours for refills to be completed.    Today you received the following chemotherapy and/or immunotherapy agents Keytruda      To help prevent nausea and vomiting after your treatment, we encourage you to take your nausea medication as directed.  BELOW ARE SYMPTOMS THAT SHOULD BE REPORTED IMMEDIATELY: *FEVER GREATER THAN 100.4 F (38 C) OR HIGHER *CHILLS OR SWEATING *NAUSEA AND VOMITING THAT IS NOT CONTROLLED WITH YOUR NAUSEA MEDICATION *UNUSUAL SHORTNESS OF BREATH *UNUSUAL BRUISING OR BLEEDING *URINARY PROBLEMS (pain or burning when urinating, or frequent urination) *BOWEL PROBLEMS (unusual diarrhea, constipation, pain near the anus) TENDERNESS IN MOUTH AND THROAT WITH OR WITHOUT PRESENCE OF ULCERS (sore throat, sores in mouth, or a toothache) UNUSUAL RASH, SWELLING OR PAIN  UNUSUAL VAGINAL DISCHARGE OR ITCHING   Items with * indicate a potential emergency and should be followed up as soon as possible or go to the Emergency Department if any problems should occur.  Please show the CHEMOTHERAPY ALERT CARD or IMMUNOTHERAPY ALERT  CARD at check-in to the Emergency Department and triage nurse.  Should you have questions after your visit or need to cancel or reschedule your appointment, please contact [facility] [place] [facility]  Dept: [phone]  and follow the prompts.  Office hours are 8:00 a.m. to 4:30 p.m. Monday - Friday. Please note that voicemails left after 4:00 p.m. may not be returned until the following business day.  We are closed weekends and major holidays. You have access to a nurse at all times for urgent questions. Please call the main number to the clinic Dept: [phone] and follow the prompts.  For any non-urgent questions, you may also contact your provider using MyChart. We now offer e-Visits for anyone 72 and older to request care online for non-urgent symptoms. For details visit [email or web address].   Also download the MyChart app! Go to the app store, search "MyChart", open the app, select [place], and log in with your MyChart username and password.

## 2024-02-18 ENCOUNTER — Other Ambulatory Visit: Payer: Self-pay

## 2024-02-19 ENCOUNTER — Telehealth: Payer: Self-pay

## 2024-02-19 NOTE — Telephone Encounter (Signed)
 [facility] Clinical Social Work  Clinical Social Work was referred by nurse for assessment of psychosocial needs (resources for medication management).  Clinical Social Worker attempted to contact patient by phone to offer support and assess for needs. Patient's phone did not ring and no voicemail is set up at this time. Patient expressed to nurse limited support to assist with medication organization. At time of call [employer] [facility] is listed on chart. CSW will attempt to reach patient again prior to calling alternative contact. As there is no indication patient is not able to make medical decisions.  Marguerita Merles, LCSW  Clinical Social Worker [facility]

## 2024-02-23 ENCOUNTER — Telehealth: Payer: Self-pay

## 2024-02-23 NOTE — Telephone Encounter (Signed)
 [facility] Clinical Social Work  Clinical Social Work was referred by nurse for assessment of psychosocial needs (Medication Hydrologist).  Clinical Social Worker contacted patient by phone to offer support and assess for needs. Patient stated primary challenge with medication compliance is organization. Patient denied any need with assistance or brainstorming solutions. Patient reported he has a pill organizer in the car and plans to use it for his medications. Patient stated [name] (Sister-in-law / Alternate Contact) is someone who could assist with organization. Patient stated he feels comfortable asking her. CSW encouraged patient to keep medical team updated if it continues to be a challenge. No further needs. No follow up needed at this time.  Jon Merles, LCSW  Clinical Social Worker [facility]

## 2024-03-04 ENCOUNTER — Telehealth: Payer: Self-pay

## 2024-03-04 ENCOUNTER — Ambulatory Visit: Payer: Medicare HMO | Admitting: Neurology

## 2024-03-04 NOTE — Telephone Encounter (Signed)
 [facility] CSW Progress Note  Clinical Social Worker spoke with Pharmacy Assistance Team who confirmed patient was enrolled in mfg assistance for the 2024 insurance year until his OOP was reached. Patient is not eligible to re-enroll in program at this time due to the amount remaining until OOP is reached. CSW relayed information to sister-in-law Derrill Memo verbalized understanding. CSW explained [employer] settlement and sent application to email [email or web address]. [name] will contact CSW with any questions. No follow up scheduled at this time.  Marguerita Merles, LCSW Clinical Social Worker [facility]

## 2024-03-04 NOTE — Telephone Encounter (Signed)
 [facility] CSW Progress Note  Clinical Social Worker  was asked by scheduling team  to contact patient's sister-in-law (alternate contact / ROI signed). CSW attempted to reach patient's sister in law on this date. CSW was unable to reach patient, left vm with direct contact.    Marguerita Merles, LCSW Clinical Social Worker [facility]

## 2024-03-04 NOTE — Telephone Encounter (Signed)
 Patient has no showed / cancelled within 24 hours 3 new patient appointments. Ok to reschedule or dismiss?

## 2024-03-05 ENCOUNTER — Encounter: Payer: Self-pay | Admitting: Neurology

## 2024-03-08 NOTE — Progress Notes (Unsigned)
 [facility]  [address] [place],  [place]  [address] [phone]  Clinic Day:  03/09/2024  Referring physician: Gus Height, PA   HISTORY OF PRESENT ILLNESS:  The patient is a 78 y.o. male with stage IIC (T4b N0 M0) melanoma, status post a wide local excision of his left lateral leg in late April 2024.  He comes in today to be evaluated before heading into his 12th cycle of maintenance pembrolizumab.   Overall, the patient claims to be doing okay.  However, he does feel weak today.  He denies this being related to his immunotherapy.  He continues to tolerate his immunotherapy without having any side effects.  He denies having any new skin lesions or lymphadenopathy which concerns him for recurrent melanoma.  PHYSICAL EXAM:  Blood pressure 104/70, pulse 79, temperature 98 F (36.7 C), temperature source Oral, resp. rate 16, height 5\' 9"  (1.753 m), weight 185 lb 11.2 oz (84.2 kg), SpO2 100%.  His systolic blood pressure dropped by 17 points upon standing Wt Readings from Last 3 Encounters:  03/09/24 185 lb 11.2 oz (84.2 kg)  02/17/24 184 lb (83.5 kg)  01/27/24 182 lb 8 oz (82.8 kg)   Body mass index is 27.42 kg/m. Performance status (ECOG): 1 - Symptomatic but completely ambulatory Physical Exam Constitutional:      Appearance: Normal appearance. He is not ill-appearing.     Comments: Unkempt appearance today  HENT:     Mouth/Throat:     Mouth: Mucous membranes are moist.     Pharynx: Oropharynx is clear. No oropharyngeal exudate or posterior oropharyngeal erythema.  Cardiovascular:     Rate and Rhythm: Normal rate and regular rhythm.     Heart sounds: No murmur heard.    No friction rub. No gallop.  Pulmonary:     Effort: Pulmonary effort is normal. No respiratory distress.     Breath sounds: Normal breath sounds. No wheezing, rhonchi or rales.  Abdominal:     General: Bowel sounds are normal. There is no distension.     Palpations:  Abdomen is soft. There is no mass.     Tenderness: There is no abdominal tenderness.  Musculoskeletal:        General: No swelling.     Right lower leg: No edema.     Left lower leg: No edema.  Lymphadenopathy:     Cervical: No cervical adenopathy.     Upper Body:     Right upper body: No supraclavicular or axillary adenopathy.     Left upper body: No supraclavicular or axillary adenopathy.     Lower Body: No right inguinal adenopathy. No left inguinal adenopathy.  Skin:    General: Skin is warm.     Coloration: Skin is not jaundiced.     Findings: No lesion or rash.  Neurological:     General: No focal deficit present.     Mental Status: He is alert and oriented to person, place, and time. Mental status is at baseline.  Psychiatric:        Mood and Affect: Mood normal.        Behavior: Behavior normal.        Thought Content: Thought content normal.    LABS:      Latest Ref Rng & Units 03/09/2024    1:19 PM 02/17/2024    9:31 AM 01/27/2024   10:45 AM  CBC  WBC 4.0 - 10.5 K/uL 9.5  8.9  7.7  Hemoglobin 13.0 - 17.0 g/dL 81.1  91.4  78.2   Hematocrit 39.0 - 52.0 % 43.0  42.1  41.4   Platelets 150 - 400 K/uL 260  263  261       Latest Ref Rng & Units 03/09/2024    1:19 PM 02/17/2024    9:31 AM 01/27/2024   10:45 AM  CMP  Glucose 70 - 99 mg/dL 956  213  086   BUN 8 - 23 mg/dL 12  13  13    Creatinine 0.61 - 1.24 mg/dL 5.78  4.69  6.29   Sodium 135 - 145 mmol/L 136  137  137   Potassium 3.5 - 5.1 mmol/L 4.3  4.0  4.2   Chloride 98 - 111 mmol/L 102  102  102   CO2 22 - 32 mmol/L 24  24  24    Calcium 8.9 - 10.3 mg/dL 8.7  8.8  8.5   Total Protein 6.5 - 8.1 g/dL 6.0  6.0  6.0   Total Bilirubin 0.0 - 1.2 mg/dL 0.5  0.6  0.5   Alkaline Phos 38 - 126 U/L 100  100  88   AST 15 - 41 U/L 19  18  13    ALT 0 - 44 U/L 14  13  8      ASSESSMENT & PLAN:  Assessment/Plan:  A 78 y.o. male with stage IIC (T4b N0 M0) melanoma, status post a wide local excision of his left lateral leg in  late April 2024.  He will proceed with his 12th cycle of adjuvant pembrolizumab today.  As he was orthostatic by systolic blood pressure today, I will also arrange for him to receive 1 L of fluids to make him more euvolemic.  Otherwise, I will see him back in 3 weeks before he heads into his 13th cycle of pembrolizumab.  The patient understands all the plans discussed today and is in agreement with them.    Shaynah Hund Kirby Funk, MD

## 2024-03-09 ENCOUNTER — Ambulatory Visit: Admitting: Oncology

## 2024-03-09 ENCOUNTER — Inpatient Hospital Stay

## 2024-03-09 ENCOUNTER — Inpatient Hospital Stay: Attending: Oncology

## 2024-03-09 ENCOUNTER — Inpatient Hospital Stay: Admitting: Oncology

## 2024-03-09 ENCOUNTER — Ambulatory Visit

## 2024-03-09 ENCOUNTER — Other Ambulatory Visit

## 2024-03-09 VITALS — BP 104/70 | HR 79 | Temp 98.0°F | Resp 16 | Ht 69.0 in | Wt 185.7 lb

## 2024-03-09 DIAGNOSIS — C4372 Malignant melanoma of left lower limb, including hip: Secondary | ICD-10-CM | POA: Diagnosis not present

## 2024-03-09 DIAGNOSIS — Z79899 Other long term (current) drug therapy: Secondary | ICD-10-CM | POA: Diagnosis not present

## 2024-03-09 DIAGNOSIS — C4371 Malignant melanoma of right lower limb, including hip: Secondary | ICD-10-CM

## 2024-03-09 DIAGNOSIS — Z5112 Encounter for antineoplastic immunotherapy: Secondary | ICD-10-CM | POA: Diagnosis not present

## 2024-03-09 DIAGNOSIS — Z5111 Encounter for antineoplastic chemotherapy: Secondary | ICD-10-CM | POA: Diagnosis not present

## 2024-03-09 LAB — CMP (CANCER CENTER ONLY)
ALT: 14 U/L (ref 0–44)
AST: 19 U/L (ref 15–41)
Albumin: 3.8 g/dL (ref 3.5–5.0)
Alkaline Phosphatase: 100 U/L (ref 38–126)
Anion gap: 11 (ref 5–15)
BUN: 12 mg/dL (ref 8–23)
CO2: 24 mmol/L (ref 22–32)
Calcium: 8.7 mg/dL — ABNORMAL LOW (ref 8.9–10.3)
Chloride: 102 mmol/L (ref 98–111)
Creatinine: 0.88 mg/dL (ref 0.61–1.24)
GFR, Estimated: >60 mL/min (ref >60)
Glucose, Bld: 277 mg/dL — ABNORMAL HIGH (ref 70–99)
Potassium: 4.3 mmol/L (ref 3.5–5.1)
Sodium: 136 mmol/L (ref 135–145)
Total Bilirubin: 0.5 mg/dL (ref 0.0–1.2)
Total Protein: 6 g/dL — ABNORMAL LOW (ref 6.5–8.1)

## 2024-03-09 LAB — CBC WITH DIFFERENTIAL (CANCER CENTER ONLY)
Abs Immature Granulocytes: 0.07 10*3/uL (ref 0.00–0.07)
Basophils Absolute: 0 10*3/uL (ref 0.0–0.1)
Basophils Relative: 0 %
Eosinophils Absolute: 0.4 10*3/uL (ref 0.0–0.5)
Eosinophils Relative: 4 %
HCT: 43 % (ref 39.0–52.0)
Hemoglobin: 14.6 g/dL (ref 13.0–17.0)
Immature Granulocytes: 1 %
Lymphocytes Relative: 25 %
Lymphs Abs: 2.4 10*3/uL (ref 0.7–4.0)
MCH: 29.7 pg (ref 26.0–34.0)
MCHC: 34 g/dL (ref 30.0–36.0)
MCV: 87.6 fL (ref 80.0–100.0)
Monocytes Absolute: 0.9 10*3/uL (ref 0.1–1.0)
Monocytes Relative: 10 %
Neutro Abs: 5.8 10*3/uL (ref 1.7–7.7)
Neutrophils Relative %: 60 %
Platelet Count: 260 10*3/uL (ref 150–400)
RBC: 4.91 MIL/uL (ref 4.22–5.81)
RDW: 13.2 % (ref 11.5–15.5)
WBC Count: 9.5 10*3/uL (ref 4.0–10.5)
nRBC: 0 % (ref 0.0–0.2)
nRBC: 0 /100{WBCs}

## 2024-03-09 LAB — TSH: TSH: 11.176 u[IU]/mL — ABNORMAL HIGH (ref 0.350–4.500)

## 2024-03-09 MED ORDER — SODIUM CHLORIDE 0.9 % IV SOLN
Freq: Once | INTRAVENOUS | Status: AC
Start: 2024-03-09 — End: 2024-03-09

## 2024-03-09 MED ORDER — HEPARIN SOD (PORK) LOCK FLUSH 100 UNIT/ML IV SOLN
500.0000 [IU] | Freq: Once | INTRAVENOUS | Status: AC | PRN
  Administered 2024-03-09: 500 [IU]

## 2024-03-09 MED ORDER — SODIUM CHLORIDE 0.9% FLUSH
10.0000 mL | INTRAVENOUS | Status: DC | PRN
  Administered 2024-03-09: 10 mL

## 2024-03-09 MED ORDER — SODIUM CHLORIDE 0.9 % IV SOLN
200.0000 mg | Freq: Once | INTRAVENOUS | Status: AC
  Administered 2024-03-09: 200 mg via INTRAVENOUS
  Filled 2024-03-09: qty 8

## 2024-03-09 NOTE — Patient Instructions (Signed)

## 2024-03-10 LAB — T4: T4, Total: 4.1 ug/dL — ABNORMAL LOW (ref 4.5–12.0)

## 2024-03-25 DIAGNOSIS — Z6827 Body mass index (BMI) 27.0-27.9, adult: Secondary | ICD-10-CM | POA: Diagnosis not present

## 2024-03-25 DIAGNOSIS — I6990 Unspecified sequelae of unspecified cerebrovascular disease: Secondary | ICD-10-CM | POA: Diagnosis not present

## 2024-03-25 DIAGNOSIS — I2510 Atherosclerotic heart disease of native coronary artery without angina pectoris: Secondary | ICD-10-CM | POA: Diagnosis not present

## 2024-03-25 DIAGNOSIS — I69331 Monoplegia of upper limb following cerebral infarction affecting right dominant side: Secondary | ICD-10-CM | POA: Diagnosis not present

## 2024-03-25 DIAGNOSIS — I6523 Occlusion and stenosis of bilateral carotid arteries: Secondary | ICD-10-CM | POA: Diagnosis not present

## 2024-03-25 DIAGNOSIS — E782 Mixed hyperlipidemia: Secondary | ICD-10-CM | POA: Diagnosis not present

## 2024-03-25 DIAGNOSIS — C4372 Malignant melanoma of left lower limb, including hip: Secondary | ICD-10-CM | POA: Diagnosis not present

## 2024-03-25 DIAGNOSIS — E034 Atrophy of thyroid (acquired): Secondary | ICD-10-CM | POA: Diagnosis not present

## 2024-03-25 DIAGNOSIS — E1159 Type 2 diabetes mellitus with other circulatory complications: Secondary | ICD-10-CM | POA: Diagnosis not present

## 2024-03-25 DIAGNOSIS — I252 Old myocardial infarction: Secondary | ICD-10-CM | POA: Diagnosis not present

## 2024-03-29 NOTE — Progress Notes (Deleted)
 [facility]  [address] [place],  [place]  [address] [phone]  Clinic Day:  03/29/2024  Referring physician: Monalisa Angles, PA   HISTORY OF PRESENT ILLNESS:  The patient is a 78 y.o. male with stage IIC (T4b N0 M0) melanoma, status post a wide local excision of his left lateral leg in late April 2024.  He comes in today to be evaluated before heading into his 13th cycle of maintenance pembrolizumab .   Overall, the patient claims to be doing okay.  However, he does feel weak today.  He denies this being related to his immunotherapy.  He continues to tolerate his immunotherapy without having any side effects.  He denies having any new skin lesions or lymphadenopathy which concerns him for recurrent melanoma.  PHYSICAL EXAM:  There were no vitals taken for this visit.  His systolic blood pressure dropped by 17 points upon standing Wt Readings from Last 3 Encounters:  03/09/24 185 lb 11.2 oz (84.2 kg)  02/17/24 184 lb (83.5 kg)  01/27/24 182 lb 8 oz (82.8 kg)   There is no height or weight on file to calculate BMI. Performance status (ECOG): 1 - Symptomatic but completely ambulatory Physical Exam Constitutional:      Appearance: Normal appearance. He is not ill-appearing.     Comments: Unkempt appearance today  HENT:     Mouth/Throat:     Mouth: Mucous membranes are moist.     Pharynx: Oropharynx is clear. No oropharyngeal exudate or posterior oropharyngeal erythema.  Cardiovascular:     Rate and Rhythm: Normal rate and regular rhythm.     Heart sounds: No murmur heard.    No friction rub. No gallop.  Pulmonary:     Effort: Pulmonary effort is normal. No respiratory distress.     Breath sounds: Normal breath sounds. No wheezing, rhonchi or rales.  Abdominal:     General: Bowel sounds are normal. There is no distension.     Palpations: Abdomen is soft. There is no mass.     Tenderness: There is no abdominal tenderness.  Musculoskeletal:         General: No swelling.     Right lower leg: No edema.     Left lower leg: No edema.  Lymphadenopathy:     Cervical: No cervical adenopathy.     Upper Body:     Right upper body: No supraclavicular or axillary adenopathy.     Left upper body: No supraclavicular or axillary adenopathy.     Lower Body: No right inguinal adenopathy. No left inguinal adenopathy.  Skin:    General: Skin is warm.     Coloration: Skin is not jaundiced.     Findings: No lesion or rash.  Neurological:     General: No focal deficit present.     Mental Status: He is alert and oriented to person, place, and time. Mental status is at baseline.  Psychiatric:        Mood and Affect: Mood normal.        Behavior: Behavior normal.        Thought Content: Thought content normal.    LABS:      Latest Ref Rng & Units 03/09/2024    1:19 PM 02/17/2024    9:31 AM 01/27/2024   10:45 AM  CBC  WBC 4.0 - 10.5 K/uL 9.5  8.9  7.7   Hemoglobin 13.0 - 17.0 g/dL 21.3  08.6  57.8   Hematocrit 39.0 - 52.0 %  43.0  42.1  41.4   Platelets 150 - 400 K/uL 260  263  261       Latest Ref Rng & Units 03/09/2024    1:19 PM 02/17/2024    9:31 AM 01/27/2024   10:45 AM  CMP  Glucose 70 - 99 mg/dL 161  096  045   BUN 8 - 23 mg/dL 12  13  13    Creatinine 0.61 - 1.24 mg/dL 4.09  8.11  9.14   Sodium 135 - 145 mmol/L 136  137  137   Potassium 3.5 - 5.1 mmol/L 4.3  4.0  4.2   Chloride 98 - 111 mmol/L 102  102  102   CO2 22 - 32 mmol/L 24  24  24    Calcium  8.9 - 10.3 mg/dL 8.7  8.8  8.5   Total Protein 6.5 - 8.1 g/dL 6.0  6.0  6.0   Total Bilirubin 0.0 - 1.2 mg/dL 0.5  0.6  0.5   Alkaline Phos 38 - 126 U/L 100  100  88   AST 15 - 41 U/L 19  18  13    ALT 0 - 44 U/L 14  13  8      ASSESSMENT & PLAN:  Assessment/Plan:  A 78 y.o. male with stage IIC (T4b N0 M0) melanoma, status post a wide local excision of his left lateral leg in late April 2024.  He will proceed with his 12th cycle of adjuvant pembrolizumab  today.  As he was  orthostatic by systolic blood pressure today, I will also arrange for him to receive 1 L of fluids to make him more euvolemic.  Otherwise, I will see him back in 3 weeks before he heads into his 13th cycle of pembrolizumab .  The patient understands all the plans discussed today and is in agreement with them.    Brier Firebaugh Felicia Horde, MD

## 2024-03-30 ENCOUNTER — Inpatient Hospital Stay

## 2024-03-30 ENCOUNTER — Inpatient Hospital Stay: Admitting: Hematology and Oncology

## 2024-03-30 NOTE — Progress Notes (Unsigned)
 [facility]  [address] [place],  [place]  [address] [phone]  Clinic Day:  03/30/2024  Referring physician: Monalisa Angles, PA   HISTORY OF PRESENT ILLNESS:  The patient is a 78 y.o. male with stage IIC (T4b N0 M0) melanoma, status post a wide local excision of his left lateral leg in late April 2024.  He comes in today to be evaluated before heading into his 13th cycle of maintenance pembrolizumab .   Overall, the patient claims to be doing okay.  However, he does feel weak today.  He denies this being related to his immunotherapy.  He continues to tolerate his immunotherapy without having any side effects.  He denies having any new skin lesions or lymphadenopathy which concerns him for recurrent melanoma.  PHYSICAL EXAM:  There were no vitals taken for this visit.  His systolic blood pressure dropped by 17 points upon standing Wt Readings from Last 3 Encounters:  03/09/24 185 lb 11.2 oz (84.2 kg)  02/17/24 184 lb (83.5 kg)  01/27/24 182 lb 8 oz (82.8 kg)   There is no height or weight on file to calculate BMI. Performance status (ECOG): 1 - Symptomatic but completely ambulatory Physical Exam Constitutional:      Appearance: Normal appearance. He is not ill-appearing.     Comments: Unkempt appearance today  HENT:     Mouth/Throat:     Mouth: Mucous membranes are moist.     Pharynx: Oropharynx is clear. No oropharyngeal exudate or posterior oropharyngeal erythema.  Cardiovascular:     Rate and Rhythm: Normal rate and regular rhythm.     Heart sounds: No murmur heard.    No friction rub. No gallop.  Pulmonary:     Effort: Pulmonary effort is normal. No respiratory distress.     Breath sounds: Normal breath sounds. No wheezing, rhonchi or rales.  Abdominal:     General: Bowel sounds are normal. There is no distension.     Palpations: Abdomen is soft. There is no mass.     Tenderness: There is no abdominal tenderness.  Musculoskeletal:         General: No swelling.     Right lower leg: No edema.     Left lower leg: No edema.  Lymphadenopathy:     Cervical: No cervical adenopathy.     Upper Body:     Right upper body: No supraclavicular or axillary adenopathy.     Left upper body: No supraclavicular or axillary adenopathy.     Lower Body: No right inguinal adenopathy. No left inguinal adenopathy.  Skin:    General: Skin is warm.     Coloration: Skin is not jaundiced.     Findings: No lesion or rash.  Neurological:     General: No focal deficit present.     Mental Status: He is alert and oriented to person, place, and time. Mental status is at baseline.  Psychiatric:        Mood and Affect: Mood normal.        Behavior: Behavior normal.        Thought Content: Thought content normal.    LABS:      Latest Ref Rng & Units 03/09/2024    1:19 PM 02/17/2024    9:31 AM 01/27/2024   10:45 AM  CBC  WBC 4.0 - 10.5 K/uL 9.5  8.9  7.7   Hemoglobin 13.0 - 17.0 g/dL 91.4  78.2  95.6   Hematocrit 39.0 - 52.0 %  43.0  42.1  41.4   Platelets 150 - 400 K/uL 260  263  261       Latest Ref Rng & Units 03/09/2024    1:19 PM 02/17/2024    9:31 AM 01/27/2024   10:45 AM  CMP  Glucose 70 - 99 mg/dL 161  096  045   BUN 8 - 23 mg/dL 12  13  13    Creatinine 0.61 - 1.24 mg/dL 4.09  8.11  9.14   Sodium 135 - 145 mmol/L 136  137  137   Potassium 3.5 - 5.1 mmol/L 4.3  4.0  4.2   Chloride 98 - 111 mmol/L 102  102  102   CO2 22 - 32 mmol/L 24  24  24    Calcium  8.9 - 10.3 mg/dL 8.7  8.8  8.5   Total Protein 6.5 - 8.1 g/dL 6.0  6.0  6.0   Total Bilirubin 0.0 - 1.2 mg/dL 0.5  0.6  0.5   Alkaline Phos 38 - 126 U/L 100  100  88   AST 15 - 41 U/L 19  18  13    ALT 0 - 44 U/L 14  13  8      ASSESSMENT & PLAN:  Assessment/Plan:  A 78 y.o. male with stage IIC (T4b N0 M0) melanoma, status post a wide local excision of his left lateral leg in late April 2024.  He will proceed with his 12th cycle of adjuvant pembrolizumab  today.  As he was  orthostatic by systolic blood pressure today, I will also arrange for him to receive 1 L of fluids to make him more euvolemic.  Otherwise, I will see him back in 3 weeks before he heads into his 13th cycle of pembrolizumab .  The patient understands all the plans discussed today and is in agreement with them.    Adelaide Adjutant, NP

## 2024-03-31 ENCOUNTER — Other Ambulatory Visit: Payer: Self-pay

## 2024-03-31 ENCOUNTER — Inpatient Hospital Stay

## 2024-03-31 ENCOUNTER — Encounter: Payer: Self-pay | Admitting: Oncology

## 2024-03-31 ENCOUNTER — Inpatient Hospital Stay (HOSPITAL_BASED_OUTPATIENT_CLINIC_OR_DEPARTMENT_OTHER): Admitting: Hematology and Oncology

## 2024-03-31 VITALS — BP 125/61 | HR 78 | Temp 97.6°F | Resp 16 | Ht 69.0 in | Wt 187.7 lb

## 2024-03-31 DIAGNOSIS — C4372 Malignant melanoma of left lower limb, including hip: Secondary | ICD-10-CM

## 2024-03-31 DIAGNOSIS — Z5112 Encounter for antineoplastic immunotherapy: Secondary | ICD-10-CM | POA: Diagnosis not present

## 2024-03-31 DIAGNOSIS — Z79899 Other long term (current) drug therapy: Secondary | ICD-10-CM | POA: Diagnosis not present

## 2024-03-31 DIAGNOSIS — Z5111 Encounter for antineoplastic chemotherapy: Secondary | ICD-10-CM | POA: Diagnosis not present

## 2024-03-31 LAB — COMPREHENSIVE METABOLIC PANEL WITH GFR
ALT: 15 U/L (ref 0–44)
AST: 20 U/L (ref 15–41)
Albumin: 3.4 g/dL — ABNORMAL LOW (ref 3.5–5.0)
Alkaline Phosphatase: 112 U/L (ref 38–126)
Anion gap: 10 (ref 5–15)
BUN: 15 mg/dL (ref 8–23)
CO2: 25 mmol/L (ref 22–32)
Calcium: 8.7 mg/dL — ABNORMAL LOW (ref 8.9–10.3)
Chloride: 101 mmol/L (ref 98–111)
Creatinine, Ser: 0.76 mg/dL (ref 0.61–1.24)
GFR, Estimated: >60 mL/min (ref >60)
Glucose, Bld: 257 mg/dL — ABNORMAL HIGH (ref 70–99)
Potassium: 4.1 mmol/L (ref 3.5–5.1)
Sodium: 136 mmol/L (ref 135–145)
Total Bilirubin: 0.6 mg/dL (ref 0.0–1.2)
Total Protein: 6 g/dL — ABNORMAL LOW (ref 6.5–8.1)

## 2024-03-31 LAB — CBC WITH DIFFERENTIAL/PLATELET
Abs Immature Granulocytes: 0.13 10*3/uL — ABNORMAL HIGH (ref 0.00–0.07)
Basophils Absolute: 0 10*3/uL (ref 0.0–0.1)
Basophils Relative: 0 %
Eosinophils Absolute: 0.5 10*3/uL (ref 0.0–0.5)
Eosinophils Relative: 5 %
HCT: 40.6 % (ref 39.0–52.0)
Hemoglobin: 13.9 g/dL (ref 13.0–17.0)
Immature Granulocytes: 1 %
Lymphocytes Relative: 29 %
Lymphs Abs: 2.8 10*3/uL (ref 0.7–4.0)
MCH: 30 pg (ref 26.0–34.0)
MCHC: 34.2 g/dL (ref 30.0–36.0)
MCV: 87.5 fL (ref 80.0–100.0)
Monocytes Absolute: 1 10*3/uL (ref 0.1–1.0)
Monocytes Relative: 10 %
Neutro Abs: 5.3 10*3/uL (ref 1.7–7.7)
Neutrophils Relative %: 55 %
Platelets: 318 10*3/uL (ref 150–400)
RBC: 4.64 MIL/uL (ref 4.22–5.81)
RDW: 13.2 % (ref 11.5–15.5)
WBC: 9.8 10*3/uL (ref 4.0–10.5)
nRBC: 0 % (ref 0.0–0.2)
nRBC: 0 /100{WBCs}

## 2024-03-31 LAB — TSH: TSH: 10.232 u[IU]/mL — ABNORMAL HIGH (ref 0.350–4.500)

## 2024-03-31 MED ORDER — HEPARIN SOD (PORK) LOCK FLUSH 100 UNIT/ML IV SOLN
500.0000 [IU] | Freq: Once | INTRAVENOUS | Status: AC | PRN
  Administered 2024-03-31: 500 [IU]

## 2024-03-31 MED ORDER — SODIUM CHLORIDE 0.9 % IV SOLN
200.0000 mg | Freq: Once | INTRAVENOUS | Status: AC
  Administered 2024-03-31: 200 mg via INTRAVENOUS
  Filled 2024-03-31: qty 8

## 2024-03-31 MED ORDER — SODIUM CHLORIDE 0.9 % IV SOLN: Freq: Once | INTRAVENOUS | Status: AC

## 2024-03-31 MED ORDER — SODIUM CHLORIDE 0.9% FLUSH
10.0000 mL | INTRAVENOUS | Status: DC | PRN
  Administered 2024-03-31: 10 mL

## 2024-03-31 NOTE — Patient Instructions (Signed)
 [facility] [place] [facility]  Discharge Instructions: Thank you for choosing [facility] to provide your oncology and hematology care.  If you have a lab appointment with the Cancer Center, please go directly to the Cancer Center and check in at the registration area.   Wear comfortable clothing and clothing appropriate for easy access to any Portacath or PICC line.   We strive to give you quality time with your provider. You may need to reschedule your appointment if you arrive late (15 or more minutes).  Arriving late affects you and other patients whose appointments are after yours.  Also, if you miss three or more appointments without notifying the office, you may be dismissed from the clinic at the provider's discretion.      For prescription refill requests, have your pharmacy contact our office and allow 72 hours for refills to be completed.    Today you received the following chemotherapy and/or immunotherapy agents Keytruda      To help prevent nausea and vomiting after your treatment, we encourage you to take your nausea medication as directed.  BELOW ARE SYMPTOMS THAT SHOULD BE REPORTED IMMEDIATELY: *FEVER GREATER THAN 100.4 F (38 C) OR HIGHER *CHILLS OR SWEATING *NAUSEA AND VOMITING THAT IS NOT CONTROLLED WITH YOUR NAUSEA MEDICATION *UNUSUAL SHORTNESS OF BREATH *UNUSUAL BRUISING OR BLEEDING *URINARY PROBLEMS (pain or burning when urinating, or frequent urination) *BOWEL PROBLEMS (unusual diarrhea, constipation, pain near the anus) TENDERNESS IN MOUTH AND THROAT WITH OR WITHOUT PRESENCE OF ULCERS (sore throat, sores in mouth, or a toothache) UNUSUAL RASH, SWELLING OR PAIN  UNUSUAL VAGINAL DISCHARGE OR ITCHING   Items with * indicate a potential emergency and should be followed up as soon as possible or go to the Emergency Department if any problems should occur.  Please show the CHEMOTHERAPY ALERT CARD or IMMUNOTHERAPY ALERT  CARD at check-in to the Emergency Department and triage nurse.  Should you have questions after your visit or need to cancel or reschedule your appointment, please contact [facility] [place] [facility]  Dept: [phone]  and follow the prompts.  Office hours are 8:00 a.m. to 4:30 p.m. Monday - Friday. Please note that voicemails left after 4:00 p.m. may not be returned until the following business day.  We are closed weekends and major holidays. You have access to a nurse at all times for urgent questions. Please call the main number to the clinic Dept: [phone] and follow the prompts.  For any non-urgent questions, you may also contact your provider using MyChart. We now offer e-Visits for anyone 72 and older to request care online for non-urgent symptoms. For details visit [email or web address].   Also download the MyChart app! Go to the app store, search "MyChart", open the app, select [place], and log in with your MyChart username and password.

## 2024-04-01 ENCOUNTER — Other Ambulatory Visit: Payer: Self-pay

## 2024-04-01 LAB — T4: T4, Total: 4.6 ug/dL (ref 4.5–12.0)

## 2024-04-08 ENCOUNTER — Other Ambulatory Visit: Payer: Self-pay

## 2024-04-12 ENCOUNTER — Encounter: Payer: Self-pay | Admitting: Oncology

## 2024-04-21 ENCOUNTER — Telehealth: Payer: Self-pay

## 2024-04-21 ENCOUNTER — Inpatient Hospital Stay: Attending: Oncology | Admitting: Hematology and Oncology

## 2024-04-21 ENCOUNTER — Encounter: Payer: Self-pay | Admitting: Hematology and Oncology

## 2024-04-21 ENCOUNTER — Inpatient Hospital Stay

## 2024-04-21 VITALS — BP 123/75 | HR 83 | Temp 97.7°F | Resp 18 | Ht 69.0 in | Wt 188.0 lb

## 2024-04-21 DIAGNOSIS — Z79899 Other long term (current) drug therapy: Secondary | ICD-10-CM | POA: Diagnosis not present

## 2024-04-21 DIAGNOSIS — C4372 Malignant melanoma of left lower limb, including hip: Secondary | ICD-10-CM

## 2024-04-21 DIAGNOSIS — R350 Frequency of micturition: Secondary | ICD-10-CM

## 2024-04-21 DIAGNOSIS — Z5111 Encounter for antineoplastic chemotherapy: Secondary | ICD-10-CM | POA: Insufficient documentation

## 2024-04-21 LAB — CBC WITH DIFFERENTIAL (CANCER CENTER ONLY)
Abs Immature Granulocytes: 0.07 10*3/uL (ref 0.00–0.07)
Basophils Absolute: 0 10*3/uL (ref 0.0–0.1)
Basophils Relative: 0 %
Eosinophils Absolute: 0.5 10*3/uL (ref 0.0–0.5)
Eosinophils Relative: 5 %
HCT: 41.4 % (ref 39.0–52.0)
Hemoglobin: 14.4 g/dL (ref 13.0–17.0)
Immature Granulocytes: 1 %
Immature Platelet Fraction: 1.9 % (ref 1.2–8.6)
Lymphocytes Relative: 31 %
Lymphs Abs: 2.8 10*3/uL (ref 0.7–4.0)
MCH: 30.4 pg (ref 26.0–34.0)
MCHC: 34.8 g/dL (ref 30.0–36.0)
MCV: 87.3 fL (ref 80.0–100.0)
Monocytes Absolute: 1 10*3/uL (ref 0.1–1.0)
Monocytes Relative: 11 %
Neutro Abs: 4.6 10*3/uL (ref 1.7–7.7)
Neutrophils Relative %: 52 %
Platelet Count: 275 10*3/uL (ref 150–400)
RBC: 4.74 MIL/uL (ref 4.22–5.81)
RDW: 13.3 % (ref 11.5–15.5)
WBC Count: 9.1 10*3/uL (ref 4.0–10.5)
nRBC: 0 % (ref 0.0–0.2)

## 2024-04-21 LAB — CMP (CANCER CENTER ONLY)
ALT: 13 U/L (ref 0–44)
AST: 15 U/L (ref 15–41)
Albumin: 3.6 g/dL (ref 3.5–5.0)
Alkaline Phosphatase: 103 U/L (ref 38–126)
Anion gap: 13 (ref 5–15)
BUN: 12 mg/dL (ref 8–23)
CO2: 22 mmol/L (ref 22–32)
Calcium: 9.1 mg/dL (ref 8.9–10.3)
Chloride: 98 mmol/L (ref 98–111)
Creatinine: 0.87 mg/dL (ref 0.61–1.24)
GFR, Estimated: >60 mL/min (ref >60)
Glucose, Bld: 346 mg/dL — ABNORMAL HIGH (ref 70–99)
Potassium: 4.1 mmol/L (ref 3.5–5.1)
Sodium: 134 mmol/L — ABNORMAL LOW (ref 135–145)
Total Bilirubin: 0.6 mg/dL (ref 0.0–1.2)
Total Protein: 6.1 g/dL — ABNORMAL LOW (ref 6.5–8.1)

## 2024-04-21 LAB — URINALYSIS, COMPLETE (UACMP) WITH MICROSCOPIC
Bacteria, UA: NONE SEEN
Bilirubin Urine: NEGATIVE
Glucose, UA: >=500 mg/dL — AB
Hgb urine dipstick: NEGATIVE
Ketones, ur: NEGATIVE mg/dL
Leukocytes,Ua: NEGATIVE
Nitrite: NEGATIVE
Protein, ur: NEGATIVE mg/dL
RBC / HPF: NONE SEEN RBC/hpf (ref 0–5)
Specific Gravity, Urine: 1.02 (ref 1.005–1.030)
pH: 6 (ref 5.0–8.0)

## 2024-04-21 MED ORDER — HEPARIN SOD (PORK) LOCK FLUSH 100 UNIT/ML IV SOLN
500.0000 [IU] | Freq: Once | INTRAVENOUS | Status: AC | PRN
Start: 2024-04-21 — End: 2024-04-21
  Administered 2024-04-21: 500 [IU]

## 2024-04-21 MED ORDER — SODIUM CHLORIDE 0.9% FLUSH
10.0000 mL | INTRAVENOUS | Status: DC | PRN
  Administered 2024-04-21: 10 mL

## 2024-04-21 MED ORDER — SODIUM CHLORIDE 0.9 % IV SOLN: Freq: Once | INTRAVENOUS | Status: AC

## 2024-04-21 MED ORDER — PEMBROLIZUMAB CHEMO INJECTION 100 MG/4ML
200.0000 mg | Freq: Once | INTRAVENOUS | Status: AC
  Administered 2024-04-21: 200 mg via INTRAVENOUS
  Filled 2024-04-21: qty 200

## 2024-04-21 NOTE — Progress Notes (Cosign Needed)
 [facility]  [address] [place],  [place]  [address] [phone]  Clinic Day:  04/21/2024  Referring physician: Monalisa Angles, PA   HISTORY OF PRESENT ILLNESS:  The patient is a 78 y.o. male with stage IIC (T4b N0 M0) melanoma, status post a wide local excision of his left lateral leg in late April 2024.  He comes in today to be evaluated before heading into his 14th cycle of adjuvant pembrolizumab .   Overall, the patient claims to be doing well.  He continues to tolerate his immunotherapy without having any side effects.  He denies having any new skin lesions or lymphadenopathy which concerns him for recurrent melanoma.  He reports nocturia x 4-5.  He denies other symptoms of infection.  He is not on any steroids.  He states he is taking his metformin  as prescribed.  He also states he is bad about keeping up with his medications and is not taking aspirin  or Plavix .  VITALS:  Temperature 97.7, pulse 83, respiration 18, blood pressure 123/75 and pulse oximetry 97% on room air.  Weight 188 pounds. Wt Readings from Last 3 Encounters:  03/31/24 187 lb 11.2 oz (85.1 kg)  03/09/24 185 lb 11.2 oz (84.2 kg)  02/17/24 184 lb (83.5 kg)  BMI 27.76 kg/m  Performance status (ECOG): 0 - Asymptomatic  PHYSICAL EXAM:  Physical Exam Vitals and nursing note reviewed.  Constitutional:      General: He is not in acute distress.    Appearance: Normal appearance. He is normal weight.  HENT:     Head: Normocephalic and atraumatic.     Mouth/Throat:     Mouth: Mucous membranes are moist.     Pharynx: Oropharynx is clear. No oropharyngeal exudate or posterior oropharyngeal erythema.     Comments: Poor dentition Eyes:     General: No scleral icterus.    Extraocular Movements: Extraocular movements intact.     Conjunctiva/sclera: Conjunctivae normal.     Pupils: Pupils are equal, round, and reactive to light.  Cardiovascular:     Rate and Rhythm: Normal rate and regular  rhythm.     Heart sounds: Normal heart sounds. No murmur heard.    No friction rub. No gallop.  Pulmonary:     Effort: Pulmonary effort is normal.     Breath sounds: Normal breath sounds. No wheezing, rhonchi or rales.  Abdominal:     General: Bowel sounds are normal. There is no distension.     Palpations: Abdomen is soft. There is no hepatomegaly, splenomegaly or mass.     Tenderness: There is no abdominal tenderness.  Musculoskeletal:        General: Normal range of motion.     Cervical back: Normal range of motion and neck supple. No tenderness.     Right lower leg: No edema.     Left lower leg: No edema.  Lymphadenopathy:     Cervical: No cervical adenopathy.     Upper Body:     Right upper body: No supraclavicular or axillary adenopathy.     Left upper body: No supraclavicular or axillary adenopathy.     Lower Body: No right inguinal adenopathy. No left inguinal adenopathy.  Skin:    General: Skin is warm and dry.     Coloration: Skin is not jaundiced.     Findings: No rash.     Comments: No evidence of recurrence at the left leg incision.  Neurological:     Mental Status: He is  alert and oriented to person, place, and time.     Cranial Nerves: No cranial nerve deficit.  Psychiatric:        Mood and Affect: Mood normal.        Behavior: Behavior normal.        Thought Content: Thought content normal.     LABS:      Latest Ref Rng & Units 04/21/2024   12:37 PM 03/31/2024    1:21 PM 03/09/2024    1:19 PM  CBC  WBC 4.0 - 10.5 K/uL 9.1  9.8  9.5   Hemoglobin 13.0 - 17.0 g/dL 16.1  09.6  04.5   Hematocrit 39.0 - 52.0 % 41.4  40.6  43.0   Platelets 150 - 400 K/uL 275  318  260       Latest Ref Rng & Units 04/21/2024   12:37 PM 03/31/2024    1:21 PM 03/09/2024    1:19 PM  CMP  Glucose 70 - 99 mg/dL 409  811  914   BUN 8 - 23 mg/dL 12  15  12    Creatinine 0.61 - 1.24 mg/dL 7.82  9.56  2.13   Sodium 135 - 145 mmol/L 134  136  136   Potassium 3.5 - 5.1 mmol/L 4.1  4.1   4.3   Chloride 98 - 111 mmol/L 98  101  102   CO2 22 - 32 mmol/L 22  25  24    Calcium  8.9 - 10.3 mg/dL 9.1  8.7  8.7   Total Protein 6.5 - 8.1 g/dL 6.1  6.0  6.0   Total Bilirubin 0.0 - 1.2 mg/dL 0.6  0.6  0.5   Alkaline Phos 38 - 126 U/L 103  112  100   AST 15 - 41 U/L 15  20  19    ALT 0 - 44 U/L 13  15  14      Lab Results  Component Value Date   PSA1 4.7 (H) 06/28/2020    Lab Results  Component Value Date   LDH 129 04/03/2023       Component Value Date/Time   LDH 129 04/03/2023 0938   PSA1 4.7 (H) 06/28/2020 0942   THGAB <1.0 07/31/2023 1114    Review Flowsheet  More data may exist      Latest Ref Rng & Units 06/28/2020 04/03/2023 07/31/2023  Oncology Labs  LDH 98 - 192 U/L - 129  -  Prostate Specific Ag, Serum 0.0 - 4.0 ng/mL 4.7  - -  Thyroglobulin Antibody 0.0 - 0.9 IU/mL - - <1.0      STUDIES:  No results found.    ASSESSMENT & PLAN:   Assessment/Plan:  78 y.o. male with stage IIC (T4b N0 M0) melanoma, status post a wide local excision of his left lateral leg in late April 2024. He is clinically doing fairly well.  His glucose is higher than previous, he had been running between 200 and 300 for the past several months but is now over 300.  I obtained a urine specimen to be sure occult urinary tract infection has not adding to his hyperglycemia.  Theoretically, he can have immune mediated pancreatitis causing worsening control of his diabetes, but he is asymptomatic.  We will schedule him to see his primary care provider, Monalisa Angles, PA-C regarding this.  He will proceed with his 14th cycle of adjuvant pembrolizumab  today.  We will plan to see him back in 3 weeks for repeat clinical assessment prior to 15th  cycle of pembrolizumab .  The patient understands all the plans discussed today and is in agreement with them.  He knows to contact our office if he develops concerns prior to his next appointment.     Alfonso Ike, PA-C   Physician Assistant [facility] [place] [phone]

## 2024-04-21 NOTE — Telephone Encounter (Signed)
 Patients blood sugar was 346 and he is not taking his medication properly. Made patient appt with PCP for tomorrow at 1345. Patient is aware and appt written down and given to patient ,and sister in law [name] also notified by phone. Spoke with Hope at [facility] office and aware why appt was made and our concerns.

## 2024-04-21 NOTE — Patient Instructions (Signed)

## 2024-04-22 DIAGNOSIS — Z6827 Body mass index (BMI) 27.0-27.9, adult: Secondary | ICD-10-CM | POA: Diagnosis not present

## 2024-04-22 DIAGNOSIS — E1159 Type 2 diabetes mellitus with other circulatory complications: Secondary | ICD-10-CM | POA: Diagnosis not present

## 2024-04-23 ENCOUNTER — Other Ambulatory Visit: Payer: Self-pay | Admitting: Hematology and Oncology

## 2024-04-23 ENCOUNTER — Telehealth: Payer: Self-pay

## 2024-04-23 LAB — URINE CULTURE: Culture: 50000 — AB

## 2024-04-23 MED ORDER — AMPICILLIN 500 MG PO CAPS: 500.0000 mg | ORAL_CAPSULE | Freq: Four times a day (QID) | ORAL | 0 refills | Status: DC

## 2024-04-23 NOTE — Telephone Encounter (Signed)
-----   Message from Alfonso Ike sent at 04/23/2024 12:23 PM EDT ----- Disregard previous message.  Lab just released a positive urine culture on him.  Unfortunately, the bacteria present needs to be treated with ampicillin which needs to be given every 6 hours. There are 2 pharmacies listed in his chart where would he like me to send the antibiotic?  He needs to be sure to get 4 doses in daily to rid himself of the infection.   Also, ask him to drink plenty of fluids, i.e. eight 8 ounce glasses of clear liquid daily.  Thank you

## 2024-04-23 NOTE — Telephone Encounter (Signed)
 Left detailed voicemail for patient's sister in law, [name]. Advised of bacteria in urine, and the need for an antibiotic which will be sent to [facility] pharmacy. Also advised of increasing fluids.

## 2024-04-23 NOTE — Addendum Note (Signed)
 Addended by: Denea Cheaney A on: 04/23/2024 02:36 PM   Modules accepted: Orders

## 2024-04-25 ENCOUNTER — Other Ambulatory Visit: Payer: Self-pay

## 2024-04-28 DIAGNOSIS — R351 Nocturia: Secondary | ICD-10-CM | POA: Diagnosis not present

## 2024-04-28 DIAGNOSIS — C61 Malignant neoplasm of prostate: Secondary | ICD-10-CM | POA: Diagnosis not present

## 2024-05-10 NOTE — Progress Notes (Unsigned)
 [facility]  [address] [place],  [place]  [address] [phone]  Clinic Day:  05/10/2024  Referring physician: Monalisa Angles, PA   HISTORY OF PRESENT ILLNESS:  The patient is a 78 y.o. male with stage IIC (T4b N0 M0) melanoma, status post a wide local excision of his left lateral leg in late April 2024.  He comes in today to be evaluated before heading into his 15th cycle of maintenance pembrolizumab .   Overall, the patient claims to be doing okay.  However, he does feel weak today.  He denies this being related to his immunotherapy.  He continues to tolerate his immunotherapy without having any side effects.  He denies having any new skin lesions or lymphadenopathy which concerns him for recurrent melanoma.  PHYSICAL EXAM:  There were no vitals taken for this visit.  His systolic blood pressure dropped by 17 points upon standing Wt Readings from Last 3 Encounters:  04/21/24 188 lb (85.3 kg)  03/31/24 187 lb 11.2 oz (85.1 kg)  03/09/24 185 lb 11.2 oz (84.2 kg)   There is no height or weight on file to calculate BMI. Performance status (ECOG): 1 - Symptomatic but completely ambulatory Physical Exam Constitutional:      Appearance: Normal appearance. He is not ill-appearing.     Comments: Unkempt appearance today  HENT:     Mouth/Throat:     Mouth: Mucous membranes are moist.     Pharynx: Oropharynx is clear. No oropharyngeal exudate or posterior oropharyngeal erythema.  Cardiovascular:     Rate and Rhythm: Normal rate and regular rhythm.     Heart sounds: No murmur heard.    No friction rub. No gallop.  Pulmonary:     Effort: Pulmonary effort is normal. No respiratory distress.     Breath sounds: Normal breath sounds. No wheezing, rhonchi or rales.  Abdominal:     General: Bowel sounds are normal. There is no distension.     Palpations: Abdomen is soft. There is no mass.     Tenderness: There is no abdominal tenderness.  Musculoskeletal:         General: No swelling.     Right lower leg: No edema.     Left lower leg: No edema.  Lymphadenopathy:     Cervical: No cervical adenopathy.     Upper Body:     Right upper body: No supraclavicular or axillary adenopathy.     Left upper body: No supraclavicular or axillary adenopathy.     Lower Body: No right inguinal adenopathy. No left inguinal adenopathy.  Skin:    General: Skin is warm.     Coloration: Skin is not jaundiced.     Findings: No lesion or rash.  Neurological:     General: No focal deficit present.     Mental Status: He is alert and oriented to person, place, and time. Mental status is at baseline.  Psychiatric:        Mood and Affect: Mood normal.        Behavior: Behavior normal.        Thought Content: Thought content normal.    LABS:      Latest Ref Rng & Units 04/21/2024   12:37 PM 03/31/2024    1:21 PM 03/09/2024    1:19 PM  CBC  WBC 4.0 - 10.5 K/uL 9.1  9.8  9.5   Hemoglobin 13.0 - 17.0 g/dL 95.2  84.1  32.4   Hematocrit 39.0 - 52.0 %  41.4  40.6  43.0   Platelets 150 - 400 K/uL 275  318  260       Latest Ref Rng & Units 04/21/2024   12:37 PM 03/31/2024    1:21 PM 03/09/2024    1:19 PM  CMP  Glucose 70 - 99 mg/dL 981  191  478   BUN 8 - 23 mg/dL 12  15  12    Creatinine 0.61 - 1.24 mg/dL 2.95  6.21  3.08   Sodium 135 - 145 mmol/L 134  136  136   Potassium 3.5 - 5.1 mmol/L 4.1  4.1  4.3   Chloride 98 - 111 mmol/L 98  101  102   CO2 22 - 32 mmol/L 22  25  24    Calcium  8.9 - 10.3 mg/dL 9.1  8.7  8.7   Total Protein 6.5 - 8.1 g/dL 6.1  6.0  6.0   Total Bilirubin 0.0 - 1.2 mg/dL 0.6  0.6  0.5   Alkaline Phos 38 - 126 U/L 103  112  100   AST 15 - 41 U/L 15  20  19    ALT 0 - 44 U/L 13  15  14      ASSESSMENT & PLAN:  Assessment/Plan:  A 78 y.o. male with stage IIC (T4b N0 M0) melanoma, status post a wide local excision of his left lateral leg in late April 2024.  He will proceed with his 12th cycle of adjuvant pembrolizumab  today.  As he was  orthostatic by systolic blood pressure today, I will also arrange for him to receive 1 L of fluids to make him more euvolemic.  Otherwise, I will see him back in 3 weeks before he heads into his 13th cycle of pembrolizumab .  The patient understands all the plans discussed today and is in agreement with them.    Savior Himebaugh Felicia Horde, MD

## 2024-05-11 ENCOUNTER — Inpatient Hospital Stay

## 2024-05-11 ENCOUNTER — Inpatient Hospital Stay: Attending: Oncology

## 2024-05-11 ENCOUNTER — Inpatient Hospital Stay (HOSPITAL_BASED_OUTPATIENT_CLINIC_OR_DEPARTMENT_OTHER): Admitting: Oncology

## 2024-05-11 VITALS — BP 128/73 | HR 80 | Temp 98.2°F | Resp 16 | Ht 69.0 in | Wt 188.3 lb

## 2024-05-11 DIAGNOSIS — C4372 Malignant melanoma of left lower limb, including hip: Secondary | ICD-10-CM | POA: Diagnosis not present

## 2024-05-11 DIAGNOSIS — Z79899 Other long term (current) drug therapy: Secondary | ICD-10-CM | POA: Insufficient documentation

## 2024-05-11 DIAGNOSIS — Z5111 Encounter for antineoplastic chemotherapy: Secondary | ICD-10-CM | POA: Insufficient documentation

## 2024-05-11 DIAGNOSIS — C439 Malignant melanoma of skin, unspecified: Secondary | ICD-10-CM

## 2024-05-11 LAB — CMP (CANCER CENTER ONLY)
ALT: 25 U/L (ref 0–44)
AST: 19 U/L (ref 15–41)
Albumin: 3.8 g/dL (ref 3.5–5.0)
Alkaline Phosphatase: 116 U/L (ref 38–126)
Anion gap: 10 (ref 5–15)
BUN: 11 mg/dL (ref 8–23)
CO2: 21 mmol/L — ABNORMAL LOW (ref 22–32)
Calcium: 8.9 mg/dL (ref 8.9–10.3)
Chloride: 103 mmol/L (ref 98–111)
Creatinine: 0.72 mg/dL (ref 0.61–1.24)
GFR, Estimated: >60 mL/min (ref >60)
Glucose, Bld: 286 mg/dL — ABNORMAL HIGH (ref 70–99)
Potassium: 4.1 mmol/L (ref 3.5–5.1)
Sodium: 134 mmol/L — ABNORMAL LOW (ref 135–145)
Total Bilirubin: 0.6 mg/dL (ref 0.0–1.2)
Total Protein: 5.9 g/dL — ABNORMAL LOW (ref 6.5–8.1)

## 2024-05-11 LAB — CBC WITH DIFFERENTIAL (CANCER CENTER ONLY)
Abs Immature Granulocytes: 0.08 10*3/uL — ABNORMAL HIGH (ref 0.00–0.07)
Basophils Absolute: 0 10*3/uL (ref 0.0–0.1)
Basophils Relative: 1 %
Eosinophils Absolute: 0.4 10*3/uL (ref 0.0–0.5)
Eosinophils Relative: 5 %
HCT: 41.2 % (ref 39.0–52.0)
Hemoglobin: 14.3 g/dL (ref 13.0–17.0)
Immature Granulocytes: 1 %
Immature Platelet Fraction: 1.3 % (ref 1.2–8.6)
Lymphocytes Relative: 24 %
Lymphs Abs: 2.1 10*3/uL (ref 0.7–4.0)
MCH: 30.1 pg (ref 26.0–34.0)
MCHC: 34.7 g/dL (ref 30.0–36.0)
MCV: 86.7 fL (ref 80.0–100.0)
Monocytes Absolute: 0.9 10*3/uL (ref 0.1–1.0)
Monocytes Relative: 11 %
Neutro Abs: 5.1 10*3/uL (ref 1.7–7.7)
Neutrophils Relative %: 58 %
Platelet Count: 281 10*3/uL (ref 150–400)
RBC: 4.75 MIL/uL (ref 4.22–5.81)
RDW: 13.2 % (ref 11.5–15.5)
WBC Count: 8.7 10*3/uL (ref 4.0–10.5)
nRBC: 0 % (ref 0.0–0.2)

## 2024-05-11 LAB — TSH: TSH: 10.086 u[IU]/mL — ABNORMAL HIGH (ref 0.350–4.500)

## 2024-05-11 MED ORDER — ALTEPLASE 2 MG IJ SOLR
2.0000 mg | Freq: Once | INTRAMUSCULAR | Status: AC | PRN
  Administered 2024-05-11: 2 mg
  Filled 2024-05-11: qty 2

## 2024-05-11 MED ORDER — SODIUM CHLORIDE 0.9% FLUSH: 10.0000 mL | INTRAVENOUS | Status: DC | PRN

## 2024-05-11 MED ORDER — SODIUM CHLORIDE 0.9 % IV SOLN
200.0000 mg | Freq: Once | INTRAVENOUS | Status: AC
  Administered 2024-05-11: 200 mg via INTRAVENOUS
  Filled 2024-05-11: qty 8

## 2024-05-11 MED ORDER — SODIUM CHLORIDE 0.9 % IV SOLN: Freq: Once | INTRAVENOUS | Status: AC

## 2024-05-11 MED ORDER — HEPARIN SOD (PORK) LOCK FLUSH 100 UNIT/ML IV SOLN
500.0000 [IU] | Freq: Once | INTRAVENOUS | Status: AC | PRN
  Administered 2024-05-11: 500 [IU]

## 2024-05-11 MED ORDER — HEPARIN SOD (PORK) LOCK FLUSH 100 UNIT/ML IV SOLN
500.0000 [IU] | Freq: Once | INTRAVENOUS | Status: DC | PRN
Start: 2024-05-11 — End: 2024-05-11

## 2024-05-11 MED ORDER — SODIUM CHLORIDE 0.9% FLUSH
10.0000 mL | INTRAVENOUS | Status: DC | PRN
  Administered 2024-05-11: 10 mL

## 2024-05-11 NOTE — Patient Instructions (Signed)

## 2024-05-11 NOTE — Progress Notes (Signed)
 1015 NO BLOOD RETURNED VIA PORT. ALTEPLASE  2 MG IV INSTILLED FOR 30 MINUTES. WILL CHECK FOR BLOOD RETURNED AFTERWARDS.

## 2024-05-12 ENCOUNTER — Ambulatory Visit

## 2024-05-12 ENCOUNTER — Other Ambulatory Visit

## 2024-05-12 ENCOUNTER — Ambulatory Visit: Admitting: Oncology

## 2024-05-12 LAB — T4: T4, Total: 4.2 ug/dL — ABNORMAL LOW (ref 4.5–12.0)

## 2024-05-28 ENCOUNTER — Other Ambulatory Visit: Payer: Self-pay

## 2024-05-29 ENCOUNTER — Other Ambulatory Visit: Payer: Self-pay

## 2024-05-31 NOTE — Progress Notes (Signed)
 [facility]  [address] [place],  [place]  [address] [phone]  Clinic Day:  06/01/2024  Referring physician: Vicci Odor, PA   HISTORY OF PRESENT ILLNESS:  The patient is a 78 y.o. male with stage IIC (T4b N0 M0) melanoma, status post a wide local excision of his left lateral leg in late April 2024.  He comes in today to be evaluated before heading into his 16th cycle of maintenance pembrolizumab .   Overall, the patient claims to be doing okay.  He continues to tolerate his immunotherapy without having any side effects.  He denies having any new skin lesions or lymphadenopathy which concerns him for recurrent melanoma.  PHYSICAL EXAM:  Blood pressure 134/72, pulse 80, temperature 97.7 F (36.5 C), temperature source Oral, resp. rate 16, height 5' 9 (1.753 m), weight 188 lb 3.2 oz (85.4 kg), SpO2 96%.   Wt Readings from Last 3 Encounters:  06/01/24 188 lb 3.2 oz (85.4 kg)  05/11/24 188 lb 4.8 oz (85.4 kg)  04/21/24 188 lb (85.3 kg)   Body mass index is 27.79 kg/m. Performance status (ECOG): 1 - Symptomatic but completely ambulatory Physical Exam Constitutional:      Appearance: Normal appearance. He is not ill-appearing.  HENT:     Mouth/Throat:     Mouth: Mucous membranes are moist.     Pharynx: Oropharynx is clear. No oropharyngeal exudate or posterior oropharyngeal erythema.  Cardiovascular:     Rate and Rhythm: Normal rate and regular rhythm.     Heart sounds: No murmur heard.    No friction rub. No gallop.  Pulmonary:     Effort: Pulmonary effort is normal. No respiratory distress.     Breath sounds: Normal breath sounds. No wheezing, rhonchi or rales.  Abdominal:     General: Bowel sounds are normal. There is no distension.     Palpations: Abdomen is soft. There is no mass.     Tenderness: There is no abdominal tenderness.  Musculoskeletal:        General: No swelling.     Right lower leg: No edema.     Left lower leg: No  edema.  Lymphadenopathy:     Cervical: No cervical adenopathy.     Upper Body:     Right upper body: No supraclavicular or axillary adenopathy.     Left upper body: No supraclavicular or axillary adenopathy.     Lower Body: No right inguinal adenopathy. No left inguinal adenopathy.  Skin:    General: Skin is warm.     Coloration: Skin is not jaundiced.     Findings: No lesion or rash.  Neurological:     General: No focal deficit present.     Mental Status: He is alert and oriented to person, place, and time. Mental status is at baseline.  Psychiatric:        Mood and Affect: Mood normal.        Behavior: Behavior normal.        Thought Content: Thought content normal.    LABS:      Latest Ref Rng & Units 06/01/2024    1:17 PM 05/11/2024   10:06 AM 04/21/2024   12:37 PM  CBC  WBC 4.0 - 10.5 K/uL 9.5  8.7  9.1   Hemoglobin 13.0 - 17.0 g/dL 86.0  85.6  85.5   Hematocrit 39.0 - 52.0 % 40.9  41.2  41.4   Platelets 150 - 400 K/uL 291  281  275       Latest Ref Rng & Units 06/01/2024    1:17 PM 05/11/2024   10:06 AM 04/21/2024   12:37 PM  CMP  Glucose 70 - 99 mg/dL 628  713  653   BUN 8 - 23 mg/dL 12  11  12    Creatinine 0.61 - 1.24 mg/dL 9.29  9.27  9.12   Sodium 135 - 145 mmol/L 135  134  134   Potassium 3.5 - 5.1 mmol/L 4.3  4.1  4.1   Chloride 98 - 111 mmol/L 101  103  98   CO2 22 - 32 mmol/L 24  21  22    Calcium  8.9 - 10.3 mg/dL 8.7  8.9  9.1   Total Protein 6.5 - 8.1 g/dL 5.8  5.9  6.1   Total Bilirubin 0.0 - 1.2 mg/dL 0.4  0.6  0.6   Alkaline Phos 38 - 126 U/L 145  116  103   AST 15 - 41 U/L 19  19  15    ALT 0 - 44 U/L 21  25  13      ASSESSMENT & PLAN:  Assessment/Plan:  A 78 y.o. male with stage IIC (T4b N0 M0) melanoma, status post a wide local excision of his left lateral leg in late April 2024.  He will proceed with his 16th cycle of adjuvant pembrolizumab  today.  Clinically, he appears to be doing well.  I will see him back in 3 weeks before he heads into his 17th  cycle of pembrolizumab .  The patient understands all the plans discussed today and is in agreement with them.    Shady Bradish DELENA Kerns, MD

## 2024-06-01 ENCOUNTER — Inpatient Hospital Stay: Attending: Oncology | Admitting: Oncology

## 2024-06-01 ENCOUNTER — Inpatient Hospital Stay

## 2024-06-01 VITALS — BP 134/72 | HR 80 | Temp 97.7°F | Resp 16 | Ht 69.0 in | Wt 188.2 lb

## 2024-06-01 DIAGNOSIS — C4371 Malignant melanoma of right lower limb, including hip: Secondary | ICD-10-CM

## 2024-06-01 DIAGNOSIS — C4372 Malignant melanoma of left lower limb, including hip: Secondary | ICD-10-CM

## 2024-06-01 DIAGNOSIS — Z5111 Encounter for antineoplastic chemotherapy: Secondary | ICD-10-CM | POA: Insufficient documentation

## 2024-06-01 DIAGNOSIS — Z79899 Other long term (current) drug therapy: Secondary | ICD-10-CM | POA: Diagnosis not present

## 2024-06-01 LAB — CMP (CANCER CENTER ONLY)
ALT: 21 U/L (ref 0–44)
AST: 19 U/L (ref 15–41)
Albumin: 3.7 g/dL (ref 3.5–5.0)
Alkaline Phosphatase: 145 U/L — ABNORMAL HIGH (ref 38–126)
Anion gap: 9 (ref 5–15)
BUN: 12 mg/dL (ref 8–23)
CO2: 24 mmol/L (ref 22–32)
Calcium: 8.7 mg/dL — ABNORMAL LOW (ref 8.9–10.3)
Chloride: 101 mmol/L (ref 98–111)
Creatinine: 0.7 mg/dL (ref 0.61–1.24)
GFR, Estimated: >60 mL/min (ref >60)
Glucose, Bld: 371 mg/dL — ABNORMAL HIGH (ref 70–99)
Potassium: 4.3 mmol/L (ref 3.5–5.1)
Sodium: 135 mmol/L (ref 135–145)
Total Bilirubin: 0.4 mg/dL (ref 0.0–1.2)
Total Protein: 5.8 g/dL — ABNORMAL LOW (ref 6.5–8.1)

## 2024-06-01 LAB — CBC WITH DIFFERENTIAL (CANCER CENTER ONLY)
Abs Immature Granulocytes: 0.11 10*3/uL — ABNORMAL HIGH (ref 0.00–0.07)
Basophils Absolute: 0 10*3/uL (ref 0.0–0.1)
Basophils Relative: 0 %
Eosinophils Absolute: 0.5 10*3/uL (ref 0.0–0.5)
Eosinophils Relative: 5 %
HCT: 40.9 % (ref 39.0–52.0)
Hemoglobin: 13.9 g/dL (ref 13.0–17.0)
Immature Granulocytes: 1 %
Lymphocytes Relative: 27 %
Lymphs Abs: 2.6 10*3/uL (ref 0.7–4.0)
MCH: 30.2 pg (ref 26.0–34.0)
MCHC: 34 g/dL (ref 30.0–36.0)
MCV: 88.7 fL (ref 80.0–100.0)
Monocytes Absolute: 0.8 10*3/uL (ref 0.1–1.0)
Monocytes Relative: 9 %
Neutro Abs: 5.5 10*3/uL (ref 1.7–7.7)
Neutrophils Relative %: 58 %
Platelet Count: 291 10*3/uL (ref 150–400)
RBC: 4.61 MIL/uL (ref 4.22–5.81)
RDW: 13.2 % (ref 11.5–15.5)
WBC Count: 9.5 10*3/uL (ref 4.0–10.5)
nRBC: 0 % (ref 0.0–0.2)

## 2024-06-01 MED ORDER — HEPARIN SOD (PORK) LOCK FLUSH 100 UNIT/ML IV SOLN
500.0000 [IU] | Freq: Once | INTRAVENOUS | Status: AC | PRN
  Administered 2024-06-01: 500 [IU]

## 2024-06-01 MED ORDER — SODIUM CHLORIDE 0.9 % IV SOLN
200.0000 mg | Freq: Once | INTRAVENOUS | Status: AC
  Administered 2024-06-01: 200 mg via INTRAVENOUS
  Filled 2024-06-01: qty 200

## 2024-06-01 MED ORDER — SODIUM CHLORIDE 0.9% FLUSH
10.0000 mL | INTRAVENOUS | Status: DC | PRN
  Administered 2024-06-01: 10 mL

## 2024-06-01 MED ORDER — SODIUM CHLORIDE 0.9 % IV SOLN: Freq: Once | INTRAVENOUS | Status: AC

## 2024-06-01 NOTE — Patient Instructions (Signed)

## 2024-06-02 ENCOUNTER — Other Ambulatory Visit: Payer: Self-pay

## 2024-06-11 NOTE — Progress Notes (Unsigned)
 Cardiology Office Note   Date:  06/15/2024  ID:  Jon Nelson, DOB [birth year], MRN [id number] PCP: Vicci Odor, PA  [facility] Providers Cardiologist:  Lamar Fitch, MD Cardiology APP:  Carlin Delon BROCKS, NP     History of Present Illness Jon Nelson is a 78 y.o. male with a past medical history of CAD s/p CABG x 3, cryptogenic stroke, DM 2, carotid artery stenosis, dyslipidemia, metastatic melanoma.  05/13/2023 carotid duplex mild bilateral carotid artery stenosis 09/24/2021 CABG x 3 LIMA to LAD, SVG to OM, SVG to PDA 09/18/2021 left heart cath multivessel CAD >> TCTS consultation  He [facility] care with [facility] in 2020 for the evaluation of a cryptogenic stroke, subsequently underwent ILR implantation with no revealing causes.  In 2022 he had a left heart cath revealing multivessel CAD ultimately underwent CABG x 3 with LIMA to LAD, SVG to OM, SVG to PDA.  Most recently evaluated by Dr. Fitch on 04/16/2023, he was overall stable from a cardiac perspective no changes were made to his medications or plan of care and he was advised to follow-up in 6 months.  He presents today for follow-up of his CAD.  He does not offer any formal complaints, not been evaluated in our office in greater than a year, he is not entirely sure what medications he is taking, when I inquire about this he states he just did not want to take medications anymore.  He lives by himself, he has a sister-in-law locally but no other family.  He is not sure when he last saw his PCP or when he will see them again. He denies chest pain, palpitations, dyspnea, pnd, orthopnea, n, v, dizziness, syncope, edema, weight gain, or early satiety.    ROS: Review of Systems  All other systems reviewed and are negative.    Studies Reviewed      Cardiac Studies & Procedures   ______________________________________________________________________________________________ CARDIAC  CATHETERIZATION  CARDIAC CATHETERIZATION 09/18/2021  Conclusion CONCLUSIONS: 99% proximal LAD within a heavily calcified segment followed by 80% mid stenosis and 95% apical stenosis.  LAD flow is TIMI grade II.  First and second diagonals have severe obstructive disease. Left main is widely patent. The circumflex is codominant.  30% proximal circumflex, 99% first obtuse marginal, 70 to 80% proximal large second obtuse marginal.  The second obtuse marginal has high-grade distal disease in a trifurcation.  The PDA of the left circumflex contains proximal to mid 90% stenosis. RCA is nondominant and contains segmental 70% stenosis before a large right ventricular branch. Anteroapical wall motion abnormality, EF 40 to 45%, apical dyskinesis, and LVEDP 14 mmHg.  RECOMMENDATIONS:  Surgical anatomy.  TCTS consultation is recommended. RFM.  Findings Coronary Findings Diagnostic  Dominance: Left  Left Anterior Descending There is moderate diffuse disease throughout the vessel. Prox LAD to Mid LAD lesion is 99% stenosed. Mid LAD lesion is 85% stenosed. Dist LAD lesion is 95% stenosed.  First Diagonal Branch Vessel is small in size. The vessel exhibits minimal luminal irregularities. 1st Diag lesion is 99% stenosed.  Left Circumflex Ost Cx to Prox Cx lesion is 40% stenosed.  First Obtuse Marginal Branch Vessel is small in size. 1st Mrg lesion is 95% stenosed.  Second Obtuse Marginal Branch Vessel is large in size. 2nd Mrg-1 lesion is 75% stenosed. 2nd Mrg-2 lesion is 95% stenosed.  Left Posterior Descending Artery LPDA lesion is 80% stenosed.  Right Coronary Artery Vessel is small. Prox RCA lesion is 75% stenosed.  Intervention  No  interventions have been documented.     ECHOCARDIOGRAM  ECHOCARDIOGRAM COMPLETE 09/19/2021  Narrative ECHOCARDIOGRAM REPORT    Patient Name:   Jon Nelson Date of Exam: 09/19/2021 Medical Rec #:  [id number]    Height:       69.0  in Accession #:    [id number]   Weight:       187.4 lb Date of Birth:  [birth year]    BSA:          2.010 m Patient Age:    75 years     BP:           108/59 mmHg Patient Gender: M            HR:           62 bpm. Exam Location:  Inpatient  Procedure: 2D Echo, Color Doppler, Cardiac Doppler and Intracardiac Opacification Agent  Indications:    Acute ischemic heart disease  History:        Patient has prior history of Echocardiogram examinations, most recent 01/31/2019. TIA; Risk Factors:Diabetes, Dyslipidemia and Hypertension.  Sonographer:    Rosaline Fujisawa MHA, RDMS, RVT, RDCS Referring Phys: [id number] VICTORY ORN [facility]   Sonographer Comments: Suboptimal apical window. IMPRESSIONS   1. No left ventricular thrombus is seen with Definity  contrast. Left ventricular ejection fraction, by estimation, is 45 to 50%. The left ventricle has mildly decreased function. The left ventricle demonstrates regional wall motion abnormalities (see scoring diagram/findings for description). Left ventricular diastolic parameters were normal. There is moderate hypokinesis of the left ventricular, apical septal wall, inferior wall, apical segment and anterior wall. 2. Right ventricular systolic function is normal. The right ventricular size is normal. 3. Left atrial size was mildly dilated. 4. The mitral valve is normal in structure. No evidence of mitral valve regurgitation. No evidence of mitral stenosis. 5. The aortic valve is normal in structure. There is mild calcification of the aortic valve. Aortic valve regurgitation is not visualized. Mild aortic valve sclerosis is present, with no evidence of aortic valve stenosis. 6. The inferior vena cava is normal in size with greater than 50% respiratory variability, suggesting right atrial pressure of 3 mmHg.  Comparison(s): Prior images reviewed side by side. The left ventricular function is worsened. The left ventricular wall motion abnormality is  new.  FINDINGS Left Ventricle: No left ventricular thrombus is seen with Definity  contrast. Left ventricular ejection fraction, by estimation, is 45 to 50%. The left ventricle has mildly decreased function. The left ventricle demonstrates regional wall motion abnormalities. Moderate hypokinesis of the left ventricular, apical septal wall, inferior wall, apical segment and anterior wall. The left ventricular internal cavity size was normal in size. There is no left ventricular hypertrophy. Left ventricular diastolic parameters were normal.   LV Wall Scoring: The apical septal segment, apical anterior segment, apical inferior segment, and apex are hypokinetic. The anterior wall, entire lateral wall, anterior septum, inferior wall, mid inferoseptal segment, and basal inferoseptal segment are normal.  Right Ventricle: The right ventricular size is normal. No increase in right ventricular wall thickness. Right ventricular systolic function is normal.  Left Atrium: Left atrial size was mildly dilated.  Right Atrium: Right atrial size was normal in size.  Pericardium: There is no evidence of pericardial effusion.  Mitral Valve: The mitral valve is normal in structure. No evidence of mitral valve regurgitation. No evidence of mitral valve stenosis.  Tricuspid Valve: The tricuspid valve is normal in structure. Tricuspid valve regurgitation is not demonstrated. No evidence  of tricuspid stenosis.  Aortic Valve: The aortic valve is normal in structure. There is mild calcification of the aortic valve. Aortic valve regurgitation is not visualized. Mild aortic valve sclerosis is present, with no evidence of aortic valve stenosis. Aortic valve mean gradient measures 4.0 mmHg. Aortic valve peak gradient measures 8.8 mmHg. Aortic valve area, by VTI measures 1.80 cm.  Pulmonic Valve: The pulmonic valve was normal in structure. Pulmonic valve regurgitation is not visualized. No evidence of pulmonic  stenosis.  Aorta: The aortic root is normal in size and structure.  Venous: The inferior vena cava is normal in size with greater than 50% respiratory variability, suggesting right atrial pressure of 3 mmHg.  IAS/Shunts: No atrial level shunt detected by color flow Doppler.   LEFT VENTRICLE PLAX 2D LVIDd:         5.30 cm   Diastology LVIDs:         4.10 cm   LV e' medial:    8.49 cm/s LV PW:         0.80 cm   LV E/e' medial:  10.8 LV IVS:        0.70 cm   LV e' lateral:   8.92 cm/s LVOT diam:     1.80 cm   LV E/e' lateral: 10.2 LV SV:         50 LV SV Index:   25 LVOT Area:     2.54 cm   RIGHT VENTRICLE RV S prime:     7.07 cm/s TAPSE (M-mode): 2.8 cm  LEFT ATRIUM             Index        RIGHT ATRIUM           Index LA diam:        3.80 cm 1.89 cm/m   RA Area:     15.10 cm LA Vol (A2C):   45.2 ml 22.49 ml/m  RA Volume:   42.40 ml  21.10 ml/m LA Vol (A4C):   70.7 ml 35.18 ml/m LA Biplane Vol: 61.7 ml 30.70 ml/m AORTIC VALVE AV Area (Vmax):    1.66 cm AV Area (Vmean):   1.66 cm AV Area (VTI):     1.80 cm AV Vmax:           148.00 cm/s AV Vmean:          95.000 cm/s AV VTI:            0.277 m AV Peak Grad:      8.8 mmHg AV Mean Grad:      4.0 mmHg LVOT Vmax:         96.40 cm/s LVOT Vmean:        62.000 cm/s LVOT VTI:          0.196 m LVOT/AV VTI ratio: 0.71  AORTA Ao Root diam: 3.10 cm  MITRAL VALVE MV Area (PHT): 4.17 cm    SHUNTS MV Decel Time: 182 msec    Systemic VTI:  0.20 m MV E velocity: 91.30 cm/s  Systemic Diam: 1.80 cm MV A velocity: 99.40 cm/s MV E/A ratio:  0.92  Mihai Croitoru MD Electronically signed by Jerel Balding MD Signature Date/Time: 09/19/2021/6:13:37 PM    Final   TEE  ECHO INTRAOPERATIVE TEE 09/24/2021  Narrative *INTRAOPERATIVE TRANSESOPHAGEAL REPORT *    Patient Name:   Jon Nelson   Date of Exam: 09/24/2021 Medical Rec #:  [id number]      Height:  69.0 in Accession #:    [id number]     Weight:        177.0 lb Date of Birth:  [birth year]      BSA:          1.96 m Patient Age:    75 years       BP:           158/70 mmHg Patient Gender: M              HR:           63 bpm. Exam Location:  Anesthesiology  Transesophogeal exam was perform intraoperatively during surgical procedure. Patient was closely monitored under general anesthesia during the entirety of examination.  Indications:     CAD Native Vessel i25.10 Sonographer:     Damien Senior RDCS Performing Phys: [id number] LINNIE KIDD LIGHTFOOT Diagnosing Phys: Garnette Skillern MD  Complications: No known complications during this procedure. POST-OP IMPRESSIONS _ Left Ventricle: has mildly reduced systolic function, with an ejection fraction of 50%. The cavity size was normal. The wall motion is normal. _ Right Ventricle: The right ventricle appears unchanged from pre-bypass. _ Aorta: The aorta appears unchanged from pre-bypass. _ Left Atrial Appendage: The left atrial appendage appears unchanged from pre-bypass. _ Aortic Valve: The aortic valve appears unchanged from pre-bypass. There is Trivial regurgitation. _ Tricuspid Valve: There is Trivial regurgitation. _ Pulmonic Valve: The pulmonic valve appears unchanged from pre-bypass. _ Interatrial Septum: The interatrial septum appears unchanged from pre-bypass. _ Comments: Post-bypass images reviewed with surgeon.  PRE-OP FINDINGS Left Ventricle: The left ventricle has low normal systolic function, with an ejection fraction of 50-55%. The cavity size was normal. There is no left ventricular hypertrophy.   Right Ventricle: The right ventricle has normal systolic function. The cavity was normal. There is no increase in right ventricular wall thickness.  Left Atrium: Left atrial size was normal in size. No left atrial/left atrial appendage thrombus was detected. Left atrial appendage velocity is reduced at less than 40 cm/s.  Right Atrium: Right atrial size was normal in size.  Interatrial  Septum: No atrial level shunt detected by color flow Doppler.  Pericardium: A small pericardial effusion is present. The pericardial effusion is localized near the right ventricle.  Mitral Valve: The mitral valve is normal in structure. Mitral valve regurgitation is not visualized by color flow Doppler.  Tricuspid Valve: The tricuspid valve was normal in structure. Tricuspid valve regurgitation was not visualized by color flow Doppler.  Aortic Valve: The aortic valve is tricuspid Aortic valve regurgitation is trivial by color flow Doppler. There is no stenosis of the aortic valve.   Pulmonic Valve: The pulmonic valve was normal in structure. Pulmonic valve regurgitation is not visualized by color flow Doppler.   Aorta: The aortic root, ascending aorta and aortic arch are normal in size and structure.  Pulmonary Artery: The pulmonary artery is of normal size.  +--------------+-------++ LEFT VENTRICLE        +--------------+-------++ PLAX 2D               +--------------+-------++ LVIDd:        4.10 cm +--------------+-------++ LVIDs:        3.00 cm +--------------+-------++ LV SV:        39 ml   +--------------+-------++ LV SV Index:  19.72   +--------------+-------++                       +--------------+-------++   Garnette  Corinne MD Electronically signed by Garnette Corinne MD Signature Date/Time: 09/24/2021/1:24:02 PM    Final        ______________________________________________________________________________________________      Risk Assessment/Calculations           Physical Exam VS:  BP 120/70   Pulse 75   Ht 5' 9 (1.753 m)   Wt 189 lb (85.7 kg)   SpO2 96%   BMI 27.91 kg/m        Wt Readings from Last 3 Encounters:  06/15/24 189 lb (85.7 kg)  06/01/24 188 lb 3.2 oz (85.4 kg)  05/11/24 188 lb 4.8 oz (85.4 kg)    GEN: Well nourished, well developed in no acute distress NECK: No JVD; No carotid bruits CARDIAC: RRR, no  murmurs, rubs, gallops RESPIRATORY:  Clear to auscultation without rales, wheezing or rhonchi  ABDOMEN: Soft, non-tender, non-distended EXTREMITIES:  No edema; No deformity   ASSESSMENT AND PLAN CAD  - s/p CABG x 3 2022; Stable with no anginal symptoms. No indication for ischemic evaluation.  He is supposed to be on metoprolol  12.5 mg twice daily, Plavix  75 mg daily as well as Crestor  however he is not taking any of them.  He is amenable to potentially restarting.  Will repeat CBC, BMET, FLP and LP(a).  Cryptogenic stroke -supposed to be on Plavix  however he is not taking, plan to restart this 75 mg daily along with his Crestor .  Carotid artery stenosis -mild stenosis.  Dyslipidemia-will repeat FLP and LFTs, plan to restart his Crestor .  Melanoma -receiving infusions through the cancer center, appears to be metastatic.         Dispo: CMET, CBC, fasting lipid panel, LP(a). Follow up in 1 year.   Signed, Delon Jon Hoover, NP

## 2024-06-15 ENCOUNTER — Ambulatory Visit: Attending: Cardiology | Admitting: Cardiology

## 2024-06-15 ENCOUNTER — Encounter: Payer: Self-pay | Admitting: Cardiology

## 2024-06-15 VITALS — BP 120/70 | HR 75 | Ht 69.0 in | Wt 189.0 lb

## 2024-06-15 DIAGNOSIS — I639 Cerebral infarction, unspecified: Secondary | ICD-10-CM

## 2024-06-15 DIAGNOSIS — I6523 Occlusion and stenosis of bilateral carotid arteries: Secondary | ICD-10-CM

## 2024-06-15 DIAGNOSIS — I2510 Atherosclerotic heart disease of native coronary artery without angina pectoris: Secondary | ICD-10-CM

## 2024-06-15 DIAGNOSIS — E1159 Type 2 diabetes mellitus with other circulatory complications: Secondary | ICD-10-CM | POA: Diagnosis not present

## 2024-06-15 DIAGNOSIS — Z951 Presence of aortocoronary bypass graft: Secondary | ICD-10-CM | POA: Diagnosis not present

## 2024-06-15 DIAGNOSIS — E782 Mixed hyperlipidemia: Secondary | ICD-10-CM

## 2024-06-15 NOTE — Patient Instructions (Signed)
 Medication Instructions:    *If you need a refill on your cardiac medications before your next appointment, please call your pharmacy*  Lab Work:  CMET CBC FLP LP(a)  If you have labs (blood work) drawn today and your tests are completely normal, you will receive your results only by: MyChart Message (if you have MyChart) OR A paper copy in the mail If you have any lab test that is abnormal or we need to change your treatment, we will call you to review the results.  Testing/Procedures:   Follow-Up: At [facility], you and your health needs are our priority.  As part of our continuing mission to provide you with exceptional heart care, our providers are all part of one team.  This team includes your primary Cardiologist (physician) and Advanced Practice Providers or APPs (Physician Assistants and Nurse Practitioners) who all work together to provide you with the care you need, when you need it.  Your next appointment:   1 year(s)  Provider:   Delon Hoover, NP [place])    We recommend signing up for the patient portal called MyChart.  Sign up information is provided on this After Visit Summary.  MyChart is used to connect with patients for Virtual Visits (Telemedicine).  Patients are able to view lab/test results, encounter notes, upcoming appointments, etc.  Non-urgent messages can be sent to your provider as well.   To learn more about what you can do with MyChart, go to [email or web address].   Other Instructions

## 2024-06-16 ENCOUNTER — Ambulatory Visit: Payer: Self-pay | Admitting: Cardiology

## 2024-06-16 ENCOUNTER — Other Ambulatory Visit: Payer: Self-pay

## 2024-06-16 ENCOUNTER — Encounter: Payer: Self-pay | Admitting: Oncology

## 2024-06-16 DIAGNOSIS — I2510 Atherosclerotic heart disease of native coronary artery without angina pectoris: Secondary | ICD-10-CM

## 2024-06-16 LAB — COMPREHENSIVE METABOLIC PANEL WITH GFR
ALT: 18 IU/L (ref 0–44)
AST: 18 IU/L (ref 0–40)
Albumin: 3.9 g/dL (ref 3.8–4.8)
Alkaline Phosphatase: 138 IU/L — ABNORMAL HIGH (ref 44–121)
BUN/Creatinine Ratio: 16 (ref 10–24)
BUN: 12 mg/dL (ref 8–27)
Bilirubin Total: 0.5 mg/dL (ref 0.0–1.2)
CO2: 20 mmol/L (ref 20–29)
Calcium: 9 mg/dL (ref 8.6–10.2)
Chloride: 98 mmol/L (ref 96–106)
Creatinine, Ser: 0.74 mg/dL — ABNORMAL LOW (ref 0.76–1.27)
Globulin, Total: 2.3 g/dL (ref 1.5–4.5)
Glucose: 287 mg/dL — ABNORMAL HIGH (ref 70–99)
Potassium: 4.9 mmol/L (ref 3.5–5.2)
Sodium: 132 mmol/L — ABNORMAL LOW (ref 134–144)
Total Protein: 6.2 g/dL (ref 6.0–8.5)
eGFR: 93 mL/min/1.73

## 2024-06-16 LAB — CBC WITH DIFFERENTIAL/PLATELET
Basophils Absolute: 0 x10E3/uL (ref 0.0–0.2)
Basos: 0 %
EOS (ABSOLUTE): 0.4 x10E3/uL (ref 0.0–0.4)
Eos: 4 %
Hematocrit: 44.1 % (ref 37.5–51.0)
Hemoglobin: 14.6 g/dL (ref 13.0–17.7)
Immature Grans (Abs): 0.1 x10E3/uL (ref 0.0–0.1)
Immature Granulocytes: 1 %
Lymphocytes Absolute: 2.8 x10E3/uL (ref 0.7–3.1)
Lymphs: 29 %
MCH: 30.6 pg (ref 26.6–33.0)
MCHC: 33.1 g/dL (ref 31.5–35.7)
MCV: 93 fL (ref 79–97)
Monocytes Absolute: 1 x10E3/uL — ABNORMAL HIGH (ref 0.1–0.9)
Monocytes: 10 %
Neutrophils Absolute: 5.4 x10E3/uL (ref 1.4–7.0)
Neutrophils: 56 %
Platelets: 337 x10E3/uL (ref 150–450)
RBC: 4.77 x10E6/uL (ref 4.14–5.80)
RDW: 12.9 % (ref 11.6–15.4)
WBC: 9.6 x10E3/uL (ref 3.4–10.8)

## 2024-06-16 LAB — LIPID PANEL
Chol/HDL Ratio: 9.2 ratio — ABNORMAL HIGH (ref 0.0–5.0)
Cholesterol, Total: 229 mg/dL — ABNORMAL HIGH (ref 100–199)
HDL: 25 mg/dL — ABNORMAL LOW
LDL Chol Calc (NIH): 108 mg/dL — ABNORMAL HIGH (ref 0–99)
Triglycerides: 555 mg/dL (ref 0–149)
VLDL Cholesterol Cal: 96 mg/dL — ABNORMAL HIGH (ref 5–40)

## 2024-06-16 LAB — LIPOPROTEIN A (LPA): Lipoprotein (a): 145.3 nmol/L — ABNORMAL HIGH

## 2024-06-17 MED ORDER — ROSUVASTATIN CALCIUM 40 MG PO TABS: 40.0000 mg | ORAL_TABLET | Freq: Every day | ORAL | 3 refills | Status: AC

## 2024-06-17 MED ORDER — CLOPIDOGREL BISULFATE 75 MG PO TABS: 75.0000 mg | ORAL_TABLET | Freq: Every day | ORAL | 3 refills | Status: AC

## 2024-06-17 NOTE — Telephone Encounter (Signed)
 Results reviewed with [name] per DPR as per Delon Hoover NP's note. [name] verbalized understanding and had no additional questions. Routed to PCP.

## 2024-06-22 ENCOUNTER — Inpatient Hospital Stay (HOSPITAL_BASED_OUTPATIENT_CLINIC_OR_DEPARTMENT_OTHER): Admitting: Oncology

## 2024-06-22 ENCOUNTER — Inpatient Hospital Stay

## 2024-06-22 ENCOUNTER — Telehealth: Payer: Self-pay | Admitting: Oncology

## 2024-06-22 VITALS — BP 123/65 | HR 80 | Temp 98.2°F | Resp 16 | Ht 69.0 in | Wt 187.5 lb

## 2024-06-22 DIAGNOSIS — C4372 Malignant melanoma of left lower limb, including hip: Secondary | ICD-10-CM | POA: Diagnosis not present

## 2024-06-22 DIAGNOSIS — Z5111 Encounter for antineoplastic chemotherapy: Secondary | ICD-10-CM | POA: Diagnosis not present

## 2024-06-22 DIAGNOSIS — Z09 Encounter for follow-up examination after completed treatment for conditions other than malignant neoplasm: Secondary | ICD-10-CM

## 2024-06-22 DIAGNOSIS — Z79899 Other long term (current) drug therapy: Secondary | ICD-10-CM | POA: Diagnosis not present

## 2024-06-22 LAB — CMP (CANCER CENTER ONLY)
ALT: 32 U/L (ref 0–44)
AST: 33 U/L (ref 15–41)
Albumin: 3.7 g/dL (ref 3.5–5.0)
Alkaline Phosphatase: 153 U/L — ABNORMAL HIGH (ref 38–126)
Anion gap: 11 (ref 5–15)
BUN: 14 mg/dL (ref 8–23)
CO2: 24 mmol/L (ref 22–32)
Calcium: 8.9 mg/dL (ref 8.9–10.3)
Chloride: 101 mmol/L (ref 98–111)
Creatinine: 0.68 mg/dL (ref 0.61–1.24)
GFR, Estimated: >60 mL/min (ref >60)
Glucose, Bld: 242 mg/dL — ABNORMAL HIGH (ref 70–99)
Potassium: 4.1 mmol/L (ref 3.5–5.1)
Sodium: 136 mmol/L (ref 135–145)
Total Bilirubin: 0.6 mg/dL (ref 0.0–1.2)
Total Protein: 6.6 g/dL (ref 6.5–8.1)

## 2024-06-22 LAB — TSH: TSH: 10.711 u[IU]/mL — ABNORMAL HIGH (ref 0.350–4.500)

## 2024-06-22 LAB — CBC WITH DIFFERENTIAL (CANCER CENTER ONLY)
Abs Immature Granulocytes: 0.1 K/uL — ABNORMAL HIGH (ref 0.00–0.07)
Basophils Absolute: 0 K/uL (ref 0.0–0.1)
Basophils Relative: 0 %
Eosinophils Absolute: 0.5 K/uL (ref 0.0–0.5)
Eosinophils Relative: 4 %
HCT: 41.7 % (ref 39.0–52.0)
Hemoglobin: 14.3 g/dL (ref 13.0–17.0)
Immature Granulocytes: 1 %
Lymphocytes Relative: 29 %
Lymphs Abs: 3.2 K/uL (ref 0.7–4.0)
MCH: 29.9 pg (ref 26.0–34.0)
MCHC: 34.3 g/dL (ref 30.0–36.0)
MCV: 87.2 fL (ref 80.0–100.0)
Monocytes Absolute: 1.1 K/uL — ABNORMAL HIGH (ref 0.1–1.0)
Monocytes Relative: 10 %
Neutro Abs: 6.3 K/uL (ref 1.7–7.7)
Neutrophils Relative %: 56 %
Platelet Count: 329 K/uL (ref 150–400)
RBC: 4.78 MIL/uL (ref 4.22–5.81)
RDW: 13.1 % (ref 11.5–15.5)
WBC Count: 11.3 K/uL — ABNORMAL HIGH (ref 4.0–10.5)
nRBC: 0 % (ref 0.0–0.2)

## 2024-06-22 MED ORDER — SODIUM CHLORIDE 0.9% FLUSH
10.0000 mL | INTRAVENOUS | Status: DC | PRN
  Administered 2024-06-22: 10 mL

## 2024-06-22 MED ORDER — SODIUM CHLORIDE 0.9 % IV SOLN: Freq: Once | INTRAVENOUS | Status: AC

## 2024-06-22 MED ORDER — HEPARIN SOD (PORK) LOCK FLUSH 100 UNIT/ML IV SOLN
500.0000 [IU] | Freq: Once | INTRAVENOUS | Status: AC | PRN
  Administered 2024-06-22: 500 [IU]

## 2024-06-22 MED ORDER — SODIUM CHLORIDE 0.9 % IV SOLN
200.0000 mg | Freq: Once | INTRAVENOUS | Status: AC
  Administered 2024-06-22: 200 mg via INTRAVENOUS
  Filled 2024-06-22: qty 8

## 2024-06-22 NOTE — Patient Instructions (Signed)

## 2024-06-22 NOTE — Progress Notes (Signed)
 [facility]  [address] [place],  [place]  [address] [phone]  Clinic Day:  06/22/2024  Referring physician: Vicci Odor, PA   HISTORY OF PRESENT ILLNESS:  The patient is a 78 y.o. male with stage IIC (T4b N0 M0) melanoma, status post a wide local excision of his left lateral leg in late April 2024.  He comes in today to be evaluated before heading into his 17th cycle of maintenance pembrolizumab .   Overall, the patient claims to be doing okay.  He continues to tolerate his immunotherapy without having any side effects.  He denies having any new skin lesions or lymphadenopathy which concerns him for recurrent melanoma.  PHYSICAL EXAM:  Blood pressure 123/65, pulse 80, temperature 98.2 F (36.8 C), temperature source Oral, resp. rate 16, height 5' 9 (1.753 m), weight 187 lb 8 oz (85 kg), SpO2 96%.   Wt Readings from Last 3 Encounters:  06/22/24 187 lb 8 oz (85 kg)  06/15/24 189 lb (85.7 kg)  06/01/24 188 lb 3.2 oz (85.4 kg)   Body mass index is 27.69 kg/m. Performance status (ECOG): 1 - Symptomatic but completely ambulatory Physical Exam Constitutional:      Appearance: Normal appearance. He is not ill-appearing.  HENT:     Mouth/Throat:     Mouth: Mucous membranes are moist.     Pharynx: Oropharynx is clear. No oropharyngeal exudate or posterior oropharyngeal erythema.  Cardiovascular:     Rate and Rhythm: Normal rate and regular rhythm.     Heart sounds: No murmur heard.    No friction rub. No gallop.  Pulmonary:     Effort: Pulmonary effort is normal. No respiratory distress.     Breath sounds: Normal breath sounds. No wheezing, rhonchi or rales.  Abdominal:     General: Bowel sounds are normal. There is no distension.     Palpations: Abdomen is soft. There is no mass.     Tenderness: There is no abdominal tenderness.  Musculoskeletal:        General: No swelling.     Right lower leg: No edema.     Left lower leg: No edema.   Lymphadenopathy:     Cervical: No cervical adenopathy.     Upper Body:     Right upper body: No supraclavicular or axillary adenopathy.     Left upper body: No supraclavicular or axillary adenopathy.     Lower Body: No right inguinal adenopathy. No left inguinal adenopathy.  Skin:    General: Skin is warm.     Coloration: Skin is not jaundiced.     Findings: No lesion or rash.  Neurological:     General: No focal deficit present.     Mental Status: He is alert and oriented to person, place, and time. Mental status is at baseline.  Psychiatric:        Mood and Affect: Mood normal.        Behavior: Behavior normal.        Thought Content: Thought content normal.    LABS:      Latest Ref Rng & Units 06/22/2024    1:26 PM 06/15/2024   10:56 AM 06/01/2024    1:17 PM  CBC  WBC 4.0 - 10.5 K/uL 11.3  9.6  9.5   Hemoglobin 13.0 - 17.0 g/dL 85.6  85.3  86.0   Hematocrit 39.0 - 52.0 % 41.7  44.1  40.9   Platelets 150 - 400 K/uL 329  337  291       Latest Ref Rng & Units 06/22/2024    1:26 PM 06/15/2024   10:56 AM 06/01/2024    1:17 PM  CMP  Glucose 70 - 99 mg/dL 757  712  628   BUN 8 - 23 mg/dL 14  12  12    Creatinine 0.61 - 1.24 mg/dL 9.31  9.25  9.29   Sodium 135 - 145 mmol/L 136  132  135   Potassium 3.5 - 5.1 mmol/L 4.1  4.9  4.3   Chloride 98 - 111 mmol/L 101  98  101   CO2 22 - 32 mmol/L 24  20  24    Calcium  8.9 - 10.3 mg/dL 8.9  9.0  8.7   Total Protein 6.5 - 8.1 g/dL 6.6  6.2  5.8   Total Bilirubin 0.0 - 1.2 mg/dL 0.6  0.5  0.4   Alkaline Phos 38 - 126 U/L 153  138  145   AST 15 - 41 U/L 33  18  19   ALT 0 - 44 U/L 32  18  21     ASSESSMENT & PLAN:  Assessment/Plan:  A 78 y.o. male with stage IIC (T4b N0 M0) melanoma, status post a wide local excision of his left lateral leg in late April 2024.  He will proceed with his 17th cycle of adjuvant pembrolizumab  today.  Clinically, he appears to be doing well.  I will see him back in 3 weeks before he heads into his 18th and  final cycle of pembrolizumab .  The patient understands all the plans discussed today and is in agreement with them.    Jon Nelson DELENA Kerns, MD

## 2024-06-22 NOTE — Telephone Encounter (Signed)
 Patient has been scheduled for follow-up visit per 06/21/24 LOS.  Pt given an appt calendar with date and time.

## 2024-06-23 ENCOUNTER — Other Ambulatory Visit: Payer: Self-pay

## 2024-06-27 ENCOUNTER — Encounter: Payer: Self-pay | Admitting: Oncology

## 2024-07-01 ENCOUNTER — Encounter: Payer: Self-pay | Admitting: Oncology

## 2024-07-02 ENCOUNTER — Encounter: Payer: Self-pay | Admitting: Oncology

## 2024-07-06 ENCOUNTER — Other Ambulatory Visit: Payer: Self-pay

## 2024-07-13 ENCOUNTER — Inpatient Hospital Stay

## 2024-07-13 ENCOUNTER — Telehealth: Payer: Self-pay

## 2024-07-13 ENCOUNTER — Telehealth: Payer: Self-pay | Admitting: Hematology and Oncology

## 2024-07-13 ENCOUNTER — Encounter: Payer: Self-pay | Admitting: Hematology and Oncology

## 2024-07-13 ENCOUNTER — Inpatient Hospital Stay: Attending: Oncology

## 2024-07-13 ENCOUNTER — Inpatient Hospital Stay (HOSPITAL_BASED_OUTPATIENT_CLINIC_OR_DEPARTMENT_OTHER): Admitting: Hematology and Oncology

## 2024-07-13 VITALS — BP 128/68 | HR 87 | Temp 97.6°F | Resp 20 | Ht 69.0 in | Wt 185.0 lb

## 2024-07-13 DIAGNOSIS — R350 Frequency of micturition: Secondary | ICD-10-CM | POA: Diagnosis not present

## 2024-07-13 DIAGNOSIS — C4372 Malignant melanoma of left lower limb, including hip: Secondary | ICD-10-CM | POA: Insufficient documentation

## 2024-07-13 DIAGNOSIS — Z5111 Encounter for antineoplastic chemotherapy: Secondary | ICD-10-CM | POA: Diagnosis not present

## 2024-07-13 DIAGNOSIS — Z79899 Other long term (current) drug therapy: Secondary | ICD-10-CM | POA: Insufficient documentation

## 2024-07-13 LAB — URINALYSIS, COMPLETE (UACMP) WITH MICROSCOPIC
Bilirubin Urine: NEGATIVE
Glucose, UA: >=500 mg/dL — AB
Hgb urine dipstick: NEGATIVE
Ketones, ur: NEGATIVE mg/dL
Leukocytes,Ua: NEGATIVE
Nitrite: NEGATIVE
Protein, ur: NEGATIVE mg/dL
Specific Gravity, Urine: 1.015 (ref 1.005–1.030)
pH: 6 (ref 5.0–8.0)

## 2024-07-13 LAB — CMP (CANCER CENTER ONLY)
ALT: 30 U/L (ref 0–44)
AST: 25 U/L (ref 15–41)
Albumin: 3.7 g/dL (ref 3.5–5.0)
Alkaline Phosphatase: 147 U/L — ABNORMAL HIGH (ref 38–126)
Anion gap: 12 (ref 5–15)
BUN: 10 mg/dL (ref 8–23)
CO2: 22 mmol/L (ref 22–32)
Calcium: 8.7 mg/dL — ABNORMAL LOW (ref 8.9–10.3)
Chloride: 99 mmol/L (ref 98–111)
Creatinine: 0.79 mg/dL (ref 0.61–1.24)
GFR, Estimated: >60 mL/min (ref >60)
Glucose, Bld: 383 mg/dL — ABNORMAL HIGH (ref 70–99)
Potassium: 4.3 mmol/L (ref 3.5–5.1)
Sodium: 134 mmol/L — ABNORMAL LOW (ref 135–145)
Total Bilirubin: 0.8 mg/dL (ref 0.0–1.2)
Total Protein: 6.2 g/dL — ABNORMAL LOW (ref 6.5–8.1)

## 2024-07-13 LAB — CBC WITH DIFFERENTIAL (CANCER CENTER ONLY)
Abs Immature Granulocytes: 0.12 K/uL — ABNORMAL HIGH (ref 0.00–0.07)
Basophils Absolute: 0 K/uL (ref 0.0–0.1)
Basophils Relative: 0 %
Eosinophils Absolute: 0.4 K/uL (ref 0.0–0.5)
Eosinophils Relative: 4 %
HCT: 40.1 % (ref 39.0–52.0)
Hemoglobin: 14 g/dL (ref 13.0–17.0)
Immature Granulocytes: 1 %
Lymphocytes Relative: 25 %
Lymphs Abs: 2.5 K/uL (ref 0.7–4.0)
MCH: 30.2 pg (ref 26.0–34.0)
MCHC: 34.9 g/dL (ref 30.0–36.0)
MCV: 86.4 fL (ref 80.0–100.0)
Monocytes Absolute: 1 K/uL (ref 0.1–1.0)
Monocytes Relative: 10 %
Neutro Abs: 5.7 K/uL (ref 1.7–7.7)
Neutrophils Relative %: 60 %
Platelet Count: 323 K/uL (ref 150–400)
RBC: 4.64 MIL/uL (ref 4.22–5.81)
RDW: 13.1 % (ref 11.5–15.5)
WBC Count: 9.7 K/uL (ref 4.0–10.5)
nRBC: 0 % (ref 0.0–0.2)

## 2024-07-13 LAB — TSH: TSH: 9.475 u[IU]/mL — ABNORMAL HIGH (ref 0.350–4.500)

## 2024-07-13 MED ORDER — SODIUM CHLORIDE 0.9 % IV SOLN
Freq: Once | INTRAVENOUS | Status: AC
Start: 2024-07-13 — End: 2024-07-13

## 2024-07-13 MED ORDER — SODIUM CHLORIDE 0.9 % IV SOLN
200.0000 mg | Freq: Once | INTRAVENOUS | Status: AC
  Administered 2024-07-13 (×2): 200 mg via INTRAVENOUS
  Filled 2024-07-13: qty 200

## 2024-07-13 NOTE — Progress Notes (Cosign Needed)
 [facility]  [address] [place],  [place]  [address] [phone]  Clinic Day:  07/13/2024  Referring physician: Vicci Odor, PA   HISTORY OF PRESENT ILLNESS:  The patient is a 78 y.o. male with stage IIC (T4b N0 M0) melanoma, status post a wide local excision of his left lateral leg in late April 2024.  He comes in today to be evaluated before heading into his 18th and final cycle of maintenance pembrolizumab .   Overall, the patient claims to be doing okay.  He continues to tolerate his immunotherapy without having any side effects.  He denies having any new skin lesions or lymphadenopathy which concerns him for recurrent melanoma.  He has had some lower back pain this week after moving furniture, which is slowly improving.  He also reports increased frequency and urgency of urination.  He denies dysuria, fevers or chills.     VITALS:   Blood pressure 128/68, pulse 87, temperature 97.6 F (36.4 C), temperature source Oral, resp. rate 20, height 5' 9 (1.753 m), weight 185 lb (83.9 kg), SpO2 93%. Wt Readings from Last 3 Encounters:  07/13/24 185 lb (83.9 kg)  06/22/24 187 lb 8 oz (85 kg)  06/15/24 189 lb (85.7 kg)   Body mass index is 27.32 kg/m.  Performance status (ECOG): 1 - Symptomatic but completely ambulatory  PHYSICAL EXAM:   Physical Exam Vitals and nursing note reviewed.  Constitutional:      General: He is not in acute distress.    Appearance: Normal appearance. He is normal weight. He is not ill-appearing.  HENT:     Head: Normocephalic and atraumatic.     Mouth/Throat:     Mouth: Mucous membranes are moist.     Pharynx: Oropharynx is clear. No oropharyngeal exudate or posterior oropharyngeal erythema.  Eyes:     General: No scleral icterus.    Extraocular Movements: Extraocular movements intact.     Conjunctiva/sclera: Conjunctivae normal.     Pupils: Pupils are equal, round, and reactive to light.  Cardiovascular:     Rate and  Rhythm: Normal rate and regular rhythm.     Heart sounds: Normal heart sounds. No murmur heard.    No friction rub. No gallop.  Pulmonary:     Effort: Pulmonary effort is normal.     Breath sounds: Normal breath sounds. No wheezing, rhonchi or rales.  Abdominal:     General: Bowel sounds are normal. There is no distension.     Palpations: Abdomen is soft. There is no hepatomegaly, splenomegaly or mass.     Tenderness: There is no abdominal tenderness.  Musculoskeletal:        General: Normal range of motion.     Cervical back: Normal range of motion and neck supple. No tenderness.     Right lower leg: No edema.     Left lower leg: No edema.  Lymphadenopathy:     Cervical: No cervical adenopathy.     Upper Body:     Right upper body: No supraclavicular or axillary adenopathy.     Left upper body: No supraclavicular or axillary adenopathy.     Lower Body: No right inguinal adenopathy. No left inguinal adenopathy.  Skin:    General: Skin is warm and dry.     Coloration: Skin is not jaundiced.     Findings: No rash.     Comments: No evidence of recurrence left leg   Neurological:     Mental Status: He is  alert and oriented to person, place, and time.     Cranial Nerves: No cranial nerve deficit.  Psychiatric:        Mood and Affect: Mood normal.        Behavior: Behavior normal.        Thought Content: Thought content normal.      LABS:      Latest Ref Rng & Units 07/13/2024    9:06 AM 06/22/2024    1:26 PM 06/15/2024   10:56 AM  CBC  WBC 4.0 - 10.5 K/uL 9.7  11.3  9.6   Hemoglobin 13.0 - 17.0 g/dL 85.9  85.6  85.3   Hematocrit 39.0 - 52.0 % 40.1  41.7  44.1   Platelets 150 - 400 K/uL 323  329  337       Latest Ref Rng & Units 07/13/2024    9:06 AM 06/22/2024    1:26 PM 06/15/2024   10:56 AM  CMP  Glucose 70 - 99 mg/dL 616  757  712   BUN 8 - 23 mg/dL 10  14  12    Creatinine 0.61 - 1.24 mg/dL 9.20  9.31  9.25   Sodium 135 - 145 mmol/L 134  136  132   Potassium 3.5 -  5.1 mmol/L 4.3  4.1  4.9   Chloride 98 - 111 mmol/L 99  101  98   CO2 22 - 32 mmol/L 22  24  20    Calcium  8.9 - 10.3 mg/dL 8.7  8.9  9.0   Total Protein 6.5 - 8.1 g/dL 6.2  6.6  6.2   Total Bilirubin 0.0 - 1.2 mg/dL 0.8  0.6  0.5   Alkaline Phos 38 - 126 U/L 147  153  138   AST 15 - 41 U/L 25  33  18   ALT 0 - 44 U/L 30  32  18      No results found for: CEA1, CEA / No results found for: CEA1, CEA Lab Results  Component Value Date   PSA1 4.7 (H) 06/28/2020    Lab Results  Component Value Date   LDH 129 04/03/2023       Component Value Date/Time   LDH 129 04/03/2023 0938   PSA1 4.7 (H) 06/28/2020 0942   THGAB <1.0 07/31/2023 1114    Review Flowsheet  More data may exist      Latest Ref Rng & Units 06/28/2020 04/03/2023 07/31/2023  Oncology Labs  LDH 98 - 192 U/L - 129  -  Prostate Specific Ag, Serum 0.0 - 4.0 ng/mL 4.7  - -  Thyroglobulin Antibody 0.0 - 0.9 IU/mL - - <1.0      STUDIES:   No results found.    ASSESSMENT & PLAN:   Assessment/Plan:  78 y.o. male with stage IIC (T4b N0 M0) melanoma, status post a wide local excision of his left lateral leg in late April 2024.  Clinically, he appears to be doing well.  He will proceed with his 17th cycle of adjuvant pembrolizumab  today.  Urinalysis was negative.  I will see him back in 3 months for repeat clinical assessment. The patient understands all the plans discussed today and is in agreement with them.  He knows to contact our office if he develops concerns prior to his next appointment.     Andrez DELENA Foy, PA-C   Physician Assistant [facility] [phone]

## 2024-07-13 NOTE — Patient Instructions (Signed)

## 2024-07-13 NOTE — Telephone Encounter (Signed)
 [name] notified and voiced understanding , she will relay the message and importance to him.

## 2024-07-13 NOTE — Telephone Encounter (Signed)
 Patient has been scheduled for follow-up visit per 07/13/24 LOS.  Pt given an appt calendar with date and time.

## 2024-07-14 ENCOUNTER — Other Ambulatory Visit: Payer: Self-pay

## 2024-07-14 ENCOUNTER — Telehealth: Payer: Self-pay

## 2024-07-14 ENCOUNTER — Other Ambulatory Visit: Payer: Self-pay | Admitting: Hematology and Oncology

## 2024-07-14 LAB — URINE CULTURE: Culture: 20000 — AB

## 2024-07-14 LAB — T4: T4, Total: 4.5 ug/dL (ref 4.5–12.0)

## 2024-07-14 MED ORDER — AMPICILLIN 500 MG PO CAPS: 500.0000 mg | ORAL_CAPSULE | Freq: Four times a day (QID) | ORAL | 0 refills | Status: AC

## 2024-07-14 NOTE — Telephone Encounter (Signed)
 [name] notified and voiced understanding.

## 2024-07-14 NOTE — Telephone Encounter (Signed)
-----   Message from Andrez DELENA Foy sent at 07/14/2024  3:41 PM EDT ----- Please let him know he has a mild UTI. I sent in antibiotic for him to take because he has symptoms. Please let him know his blood sugar was high, which can be infection, but I want him to f/u with PCP next week. Thanks

## 2024-07-15 DIAGNOSIS — E034 Atrophy of thyroid (acquired): Secondary | ICD-10-CM | POA: Diagnosis not present

## 2024-07-15 DIAGNOSIS — E538 Deficiency of other specified B group vitamins: Secondary | ICD-10-CM | POA: Diagnosis not present

## 2024-07-15 DIAGNOSIS — I2510 Atherosclerotic heart disease of native coronary artery without angina pectoris: Secondary | ICD-10-CM | POA: Diagnosis not present

## 2024-07-15 DIAGNOSIS — I69331 Monoplegia of upper limb following cerebral infarction affecting right dominant side: Secondary | ICD-10-CM | POA: Diagnosis not present

## 2024-07-15 DIAGNOSIS — Z6827 Body mass index (BMI) 27.0-27.9, adult: Secondary | ICD-10-CM | POA: Diagnosis not present

## 2024-07-15 DIAGNOSIS — I252 Old myocardial infarction: Secondary | ICD-10-CM | POA: Diagnosis not present

## 2024-07-15 DIAGNOSIS — I6523 Occlusion and stenosis of bilateral carotid arteries: Secondary | ICD-10-CM | POA: Diagnosis not present

## 2024-07-15 DIAGNOSIS — E782 Mixed hyperlipidemia: Secondary | ICD-10-CM | POA: Diagnosis not present

## 2024-07-15 DIAGNOSIS — I6990 Unspecified sequelae of unspecified cerebrovascular disease: Secondary | ICD-10-CM | POA: Diagnosis not present

## 2024-07-15 DIAGNOSIS — E1159 Type 2 diabetes mellitus with other circulatory complications: Secondary | ICD-10-CM | POA: Diagnosis not present

## 2024-07-15 DIAGNOSIS — C4372 Malignant melanoma of left lower limb, including hip: Secondary | ICD-10-CM | POA: Diagnosis not present

## 2024-07-19 DIAGNOSIS — E538 Deficiency of other specified B group vitamins: Secondary | ICD-10-CM | POA: Diagnosis not present

## 2024-07-27 ENCOUNTER — Encounter: Payer: Self-pay | Admitting: Oncology

## 2024-08-20 DIAGNOSIS — E538 Deficiency of other specified B group vitamins: Secondary | ICD-10-CM | POA: Diagnosis not present

## 2024-09-20 DIAGNOSIS — E538 Deficiency of other specified B group vitamins: Secondary | ICD-10-CM | POA: Diagnosis not present

## 2024-10-05 ENCOUNTER — Other Ambulatory Visit: Payer: Self-pay

## 2024-10-10 NOTE — Progress Notes (Unsigned)
 [facility]  [address] [place],  [place]  [address] [phone]  Clinic Day:  10/10/2024  Referring physician: Vicci Odor, PA   HISTORY OF PRESENT ILLNESS:  The patient is a 78 y.o. male with stage IIC (T4b N0 M0) melanoma, status post a wide local excision of his left lateral leg in late April 2024.  He completed 1 full year of adjuvant pembrolizumab  in August 2025.    SABRA   Overall, the patient claims to be doing okay.  He continues to tolerate his immunotherapy without having any side effects.  He denies having any new skin lesions or lymphadenopathy which concerns him for recurrent melanoma.  PHYSICAL EXAM:  There were no vitals taken for this visit.   Wt Readings from Last 3 Encounters:  07/13/24 185 lb (83.9 kg)  06/22/24 187 lb 8 oz (85 kg)  06/15/24 189 lb (85.7 kg)   There is no height or weight on file to calculate BMI. Performance status (ECOG): 1 - Symptomatic but completely ambulatory Physical Exam Constitutional:      Appearance: Normal appearance. He is not ill-appearing.  HENT:     Mouth/Throat:     Mouth: Mucous membranes are moist.     Pharynx: Oropharynx is clear. No oropharyngeal exudate or posterior oropharyngeal erythema.  Cardiovascular:     Rate and Rhythm: Normal rate and regular rhythm.     Heart sounds: No murmur heard.    No friction rub. No gallop.  Pulmonary:     Effort: Pulmonary effort is normal. No respiratory distress.     Breath sounds: Normal breath sounds. No wheezing, rhonchi or rales.  Abdominal:     General: Bowel sounds are normal. There is no distension.     Palpations: Abdomen is soft. There is no mass.     Tenderness: There is no abdominal tenderness.  Musculoskeletal:        General: No swelling.     Right lower leg: No edema.     Left lower leg: No edema.  Lymphadenopathy:     Cervical: No cervical adenopathy.     Upper Body:     Right upper body: No supraclavicular or axillary  adenopathy.     Left upper body: No supraclavicular or axillary adenopathy.     Lower Body: No right inguinal adenopathy. No left inguinal adenopathy.  Skin:    General: Skin is warm.     Coloration: Skin is not jaundiced.     Findings: No lesion or rash.  Neurological:     General: No focal deficit present.     Mental Status: He is alert and oriented to person, place, and time. Mental status is at baseline.  Psychiatric:        Mood and Affect: Mood normal.        Behavior: Behavior normal.        Thought Content: Thought content normal.    LABS:      Latest Ref Rng & Units 07/13/2024    9:06 AM 06/22/2024    1:26 PM 06/15/2024   10:56 AM  CBC  WBC 4.0 - 10.5 K/uL 9.7  11.3  9.6   Hemoglobin 13.0 - 17.0 g/dL 85.9  85.6  85.3   Hematocrit 39.0 - 52.0 % 40.1  41.7  44.1   Platelets 150 - 400 K/uL 323  329  337       Latest Ref Rng & Units 07/13/2024    9:06 AM 06/22/2024  1:26 PM 06/15/2024   10:56 AM  CMP  Glucose 70 - 99 mg/dL 616  757  712   BUN 8 - 23 mg/dL 10  14  12    Creatinine 0.61 - 1.24 mg/dL 9.20  9.31  9.25   Sodium 135 - 145 mmol/L 134  136  132   Potassium 3.5 - 5.1 mmol/L 4.3  4.1  4.9   Chloride 98 - 111 mmol/L 99  101  98   CO2 22 - 32 mmol/L 22  24  20    Calcium  8.9 - 10.3 mg/dL 8.7  8.9  9.0   Total Protein 6.5 - 8.1 g/dL 6.2  6.6  6.2   Total Bilirubin 0.0 - 1.2 mg/dL 0.8  0.6  0.5   Alkaline Phos 38 - 126 U/L 147  153  138   AST 15 - 41 U/L 25  33  18   ALT 0 - 44 U/L 30  32  18     ASSESSMENT & PLAN:  Assessment/Plan:  A 78 y.o. male with stage IIC (T4b N0 M0) melanoma, status post a wide local excision of his left lateral leg in late April 2024.  He will proceed with his 17th cycle of adjuvant pembrolizumab  today.  Clinically, he appears to be doing well.  I will see him back in 3 weeks before he heads into his 18th and final cycle of pembrolizumab .  The patient understands all the plans discussed today and is in agreement with them.    [name]  DELENA Kerns, MD

## 2024-10-11 ENCOUNTER — Other Ambulatory Visit: Payer: Self-pay | Admitting: Oncology

## 2024-10-11 ENCOUNTER — Inpatient Hospital Stay: Attending: Oncology

## 2024-10-11 ENCOUNTER — Inpatient Hospital Stay (HOSPITAL_BASED_OUTPATIENT_CLINIC_OR_DEPARTMENT_OTHER): Admitting: Oncology

## 2024-10-11 VITALS — BP 143/80 | HR 65 | Temp 97.5°F | Resp 16 | Ht 69.0 in | Wt 184.6 lb

## 2024-10-11 DIAGNOSIS — C4372 Malignant melanoma of left lower limb, including hip: Secondary | ICD-10-CM

## 2024-10-11 DIAGNOSIS — Z85820 Personal history of malignant melanoma of skin: Secondary | ICD-10-CM | POA: Diagnosis not present

## 2024-10-13 ENCOUNTER — Inpatient Hospital Stay

## 2024-10-13 ENCOUNTER — Other Ambulatory Visit: Payer: Self-pay

## 2024-10-13 ENCOUNTER — Inpatient Hospital Stay: Admitting: Oncology

## 2024-10-13 ENCOUNTER — Ambulatory Visit (HOSPITAL_BASED_OUTPATIENT_CLINIC_OR_DEPARTMENT_OTHER)
Admission: RE | Admit: 2024-10-13 | Discharge: 2024-10-13 | Disposition: A | Discharge status: Home / Self Care | Source: Ambulatory Visit | Attending: Oncology | Admitting: Oncology

## 2024-10-13 DIAGNOSIS — I700 Atherosclerosis of aorta: Secondary | ICD-10-CM | POA: Diagnosis not present

## 2024-10-13 DIAGNOSIS — C4372 Malignant melanoma of left lower limb, including hip: Secondary | ICD-10-CM

## 2024-10-13 DIAGNOSIS — N3289 Other specified disorders of bladder: Secondary | ICD-10-CM | POA: Diagnosis not present

## 2024-10-13 DIAGNOSIS — R911 Solitary pulmonary nodule: Secondary | ICD-10-CM | POA: Diagnosis not present

## 2024-10-13 DIAGNOSIS — C439 Malignant melanoma of skin, unspecified: Secondary | ICD-10-CM | POA: Diagnosis not present

## 2024-10-13 LAB — I-STAT CREATININE (MANUAL ENTRY): Creatinine, Ser: 0.8 (ref 0.50–1.10)

## 2024-10-13 MED ORDER — IOHEXOL 300 MG/ML  SOLN
100.0000 mL | Freq: Once | INTRAMUSCULAR | Status: AC | PRN
  Administered 2024-10-13: 100 mL via INTRAVENOUS

## 2024-10-13 MED ORDER — HEPARIN SOD (PORK) LOCK FLUSH 100 UNIT/ML IV SOLN
500.0000 [IU] | Freq: Once | INTRAVENOUS | Status: AC
  Administered 2024-10-13: 500 [IU] via INTRAVENOUS

## 2024-10-18 ENCOUNTER — Ambulatory Visit (HOSPITAL_BASED_OUTPATIENT_CLINIC_OR_DEPARTMENT_OTHER)

## 2024-10-18 NOTE — Progress Notes (Unsigned)
 [facility] [address] [place],  [place]  [address] [phone]  Clinic Day:  10/19/2024  Referring physician: Vicci Odor, PA   HISTORY OF PRESENT ILLNESS:  The patient is a 78 y.o. male with stage IIC (T4b N0 M0) melanoma, status post a wide local excision of his left lateral leg in late April 2024.  He completed 1 full year of adjuvant pembrolizumab  in August 2025.  She comes in today to go over his CT scans to ascertain his new disease baseline.  Since completing his immunotherapy, the patient has felt fine.  He denies having any new lesions or lymphadenopathy which concerns him for recurrent melanoma.  PHYSICAL EXAM:  Blood pressure 130/65, pulse 71, temperature 97.8 F (36.6 C), temperature source Oral, resp. rate 16, weight 186 lb 11.2 oz (84.7 kg), SpO2 100%.   Wt Readings from Last 3 Encounters:  10/19/24 186 lb 11.2 oz (84.7 kg)  10/11/24 184 lb 9.6 oz (83.7 kg)  07/13/24 185 lb (83.9 kg)   Body mass index is 27.57 kg/m. Performance status (ECOG): 1 - Symptomatic but completely ambulatory Physical Exam Constitutional:      Appearance: Normal appearance. He is not ill-appearing.  HENT:     Mouth/Throat:     Mouth: Mucous membranes are moist.     Pharynx: Oropharynx is clear. No oropharyngeal exudate or posterior oropharyngeal erythema.  Cardiovascular:     Rate and Rhythm: Normal rate and regular rhythm.     Heart sounds: No murmur heard.    No friction rub. No gallop.  Pulmonary:     Effort: Pulmonary effort is normal. No respiratory distress.     Breath sounds: Normal breath sounds. No wheezing, rhonchi or rales.  Abdominal:     General: Bowel sounds are normal. There is no distension.     Palpations: Abdomen is soft. There is no mass.     Tenderness: There is no abdominal tenderness.  Musculoskeletal:        General: No swelling.     Right lower leg: No edema.     Left lower leg: No edema.  Lymphadenopathy:      Cervical: No cervical adenopathy.     Upper Body:     Right upper body: No supraclavicular or axillary adenopathy.     Left upper body: No supraclavicular or axillary adenopathy.     Lower Body: No right inguinal adenopathy. No left inguinal adenopathy.  Skin:    General: Skin is warm.     Coloration: Skin is not jaundiced.     Findings: No lesion or rash.  Neurological:     General: No focal deficit present.     Mental Status: He is alert and oriented to person, place, and time. Mental status is at baseline.  Psychiatric:        Mood and Affect: Mood normal.        Behavior: Behavior normal.        Thought Content: Thought content normal.   SCANS:  CT scans of his chest/abdomen/pelvis on 10-13-24 revealed the following: FINDINGS: CT CHEST FINDINGS   Cardiovascular: There is no cardiomegaly or pericardial effusion. There is coronary vascular calcification. Mild atherosclerotic calcification of the thoracic aorta. No aneurysmal dilatation or dissection. The origins of the great vessels of the aortic arch and the central pulmonary arteries are patent. Right-sided Port-A-Cath with tip at the cavoatrial junction.   Mediastinum/Nodes: No hilar or mediastinal adenopathy. The esophagus and thyroid  gland are  grossly unremarkable no mediastinal fluid collection.   Lungs/Pleura: There is an 8 mm nodule at the right lung base (68/302. No focal consolidation, pleural effusion, pneumothorax. The central airways are patent.   Musculoskeletal: Median sternotomy wires. No acute osseous pathology.   CT ABDOMEN PELVIS FINDINGS   No intra-abdominal free air or free fluid.   Hepatobiliary: The liver is unremarkable. No biliary dilatation. The gallbladder is unremarkable.   Pancreas: Unremarkable. No pancreatic ductal dilatation or surrounding inflammatory changes.   Spleen: Normal in size without focal abnormality.   Adrenals/Urinary Tract: The adrenal glands unremarkable. There is  no hydronephrosis on either side. There is symmetric enhancement and excretion of contrast by both kidneys. The visualized ureters appear unremarkable. 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   Stomach/Bowel: There is no bowel obstruction or active inflammation. The appendix is normal.   Vascular/Lymphatic: Advanced aortoiliac atherosclerotic disease. The IVC is unremarkable. No portal venous gas. There is no adenopathy.   Reproductive: The prostate and seminal vesicles are grossly unremarkable.   Other: None   Musculoskeletal: Degenerative changes of the spine. Bilateral L5 pars defects with grade 1 anterolisthesis. No acute osseous pathology.   IMPRESSION: 1. No acute intrathoracic, abdominal, or pelvic pathology. 2. An 8 mm nodule at the right lung base. 3. No evidence of metastatic disease in the abdomen or pelvis. 4.  Aortic Atherosclerosis (ICD10-I70.0).  LABS:      Latest Ref Rng & Units 07/13/2024    9:06 AM 06/22/2024    1:26 PM 06/15/2024   10:56 AM  CBC  WBC 4.0 - 10.5 K/uL 9.7  11.3  9.6   Hemoglobin 13.0 - 17.0 g/dL 85.9  85.6  85.3   Hematocrit 39.0 - 52.0 % 40.1  41.7  44.1   Platelets 150 - 400 K/uL 323  329  337       Latest Ref Rng & Units 10/13/2024    4:38 PM 07/13/2024    9:06 AM 06/22/2024    1:26 PM  CMP  Glucose 70 - 99 mg/dL  616  757   BUN 8 - 23 mg/dL  10  14   Creatinine 9.49 - 1.10 0.80  0.79  0.68   Sodium 135 - 145 mmol/L  134  136   Potassium 3.5 - 5.1 mmol/L  4.3  4.1   Chloride 98 - 111 mmol/L  99  101   CO2 22 - 32 mmol/L  22  24   Calcium  8.9 - 10.3 mg/dL  8.7  8.9   Total Protein 6.5 - 8.1 g/dL  6.2  6.6   Total Bilirubin 0.0 - 1.2 mg/dL  0.8  0.6   Alkaline Phos 38 - 126 U/L  147  153   AST 15 - 41 U/L  25  33   ALT 0 - 44 U/L  30  32    ASSESSMENT & PLAN:  Assessment/Plan:  A 78 y.o. male with stage IIC (T4b N0 M0) melanoma, status  post a wide local excision of his left lateral leg in late April 2024.  In clinic today, I went over his CT scans with him, for which he could see that he is disease free.  Clinically, he appears to be doing well.  Moving forward, I will follow this gentleman with labs and physical exams to ensure there remains no signs of recurrent melanoma.   I will see him back in 4 months  for repeat clinical assessment.  The patient understands all the plans discussed today and is in agreement with them.    Malerie Eakins DELENA Kerns, MD

## 2024-10-19 ENCOUNTER — Inpatient Hospital Stay: Admitting: Oncology

## 2024-10-19 ENCOUNTER — Other Ambulatory Visit: Payer: Self-pay | Admitting: Oncology

## 2024-10-19 VITALS — BP 130/65 | HR 71 | Temp 97.8°F | Resp 16 | Wt 186.7 lb

## 2024-10-19 DIAGNOSIS — C4372 Malignant melanoma of left lower limb, including hip: Secondary | ICD-10-CM

## 2024-10-19 DIAGNOSIS — Z85820 Personal history of malignant melanoma of skin: Secondary | ICD-10-CM | POA: Diagnosis not present

## 2024-10-20 ENCOUNTER — Telehealth: Payer: Self-pay | Admitting: Oncology

## 2024-10-20 NOTE — Telephone Encounter (Signed)
 Patient has been scheduled for follow-up visit per 10/18/24 LOS.  Pt aware of scheduled appt details.

## 2024-10-21 ENCOUNTER — Other Ambulatory Visit: Payer: Self-pay

## 2024-10-21 DIAGNOSIS — I252 Old myocardial infarction: Secondary | ICD-10-CM | POA: Diagnosis not present

## 2024-10-21 DIAGNOSIS — E538 Deficiency of other specified B group vitamins: Secondary | ICD-10-CM | POA: Diagnosis not present

## 2024-10-21 DIAGNOSIS — I700 Atherosclerosis of aorta: Secondary | ICD-10-CM | POA: Diagnosis not present

## 2024-10-21 DIAGNOSIS — I69331 Monoplegia of upper limb following cerebral infarction affecting right dominant side: Secondary | ICD-10-CM | POA: Diagnosis not present

## 2024-10-21 DIAGNOSIS — E782 Mixed hyperlipidemia: Secondary | ICD-10-CM | POA: Diagnosis not present

## 2024-10-21 DIAGNOSIS — E1159 Type 2 diabetes mellitus with other circulatory complications: Secondary | ICD-10-CM | POA: Diagnosis not present

## 2024-10-21 DIAGNOSIS — I2510 Atherosclerotic heart disease of native coronary artery without angina pectoris: Secondary | ICD-10-CM | POA: Diagnosis not present

## 2024-10-21 DIAGNOSIS — I6990 Unspecified sequelae of unspecified cerebrovascular disease: Secondary | ICD-10-CM | POA: Diagnosis not present

## 2024-10-21 DIAGNOSIS — I6523 Occlusion and stenosis of bilateral carotid arteries: Secondary | ICD-10-CM | POA: Diagnosis not present

## 2024-10-21 DIAGNOSIS — C61 Malignant neoplasm of prostate: Secondary | ICD-10-CM | POA: Diagnosis not present

## 2024-10-21 DIAGNOSIS — E034 Atrophy of thyroid (acquired): Secondary | ICD-10-CM | POA: Diagnosis not present

## 2024-10-21 DIAGNOSIS — C4372 Malignant melanoma of left lower limb, including hip: Secondary | ICD-10-CM | POA: Diagnosis not present

## 2024-10-28 DIAGNOSIS — R079 Chest pain, unspecified: Secondary | ICD-10-CM | POA: Diagnosis not present

## 2024-10-28 DIAGNOSIS — R0789 Other chest pain: Secondary | ICD-10-CM | POA: Diagnosis not present

## 2025-02-16 ENCOUNTER — Inpatient Hospital Stay: Admitting: Oncology

## 2025-02-16 ENCOUNTER — Inpatient Hospital Stay
